# Patient Record
Sex: Female | Born: 1960 | Race: Black or African American | Hispanic: No | Marital: Single | Smoking: Current every day smoker
Health system: Southern US, Community
[De-identification: ages and names within clinical notes are randomized; demographics above are authoritative.]

## PROBLEM LIST (undated history)

## (undated) DIAGNOSIS — E119 Type 2 diabetes mellitus without complications: Secondary | ICD-10-CM

## (undated) DIAGNOSIS — I10 Essential (primary) hypertension: Secondary | ICD-10-CM

## (undated) DIAGNOSIS — IMO0001 Reserved for inherently not codable concepts without codable children: Secondary | ICD-10-CM

## (undated) DIAGNOSIS — M329 Systemic lupus erythematosus, unspecified: Secondary | ICD-10-CM

## (undated) DIAGNOSIS — K754 Autoimmune hepatitis: Secondary | ICD-10-CM

## (undated) DIAGNOSIS — IMO0002 Reserved for concepts with insufficient information to code with codable children: Secondary | ICD-10-CM

## (undated) DIAGNOSIS — D496 Neoplasm of unspecified behavior of brain: Secondary | ICD-10-CM

## (undated) DIAGNOSIS — J189 Pneumonia, unspecified organism: Secondary | ICD-10-CM

## (undated) DIAGNOSIS — E662 Morbid (severe) obesity with alveolar hypoventilation: Secondary | ICD-10-CM

## (undated) DIAGNOSIS — K219 Gastro-esophageal reflux disease without esophagitis: Secondary | ICD-10-CM

## (undated) DIAGNOSIS — F17200 Nicotine dependence, unspecified, uncomplicated: Secondary | ICD-10-CM

## (undated) HISTORY — DX: Neoplasm of unspecified behavior of brain: D49.6

## (undated) HISTORY — PX: DILATION AND CURETTAGE OF UTERUS: SHX78

## (undated) HISTORY — PX: CHOLECYSTECTOMY: SHX55

## (undated) HISTORY — PX: EYE SURGERY: SHX253

---

## 2015-07-01 ENCOUNTER — Emergency Department (HOSPITAL_COMMUNITY)
Admission: EM | Admit: 2015-07-01 | Discharge: 2015-07-01 | Disposition: A | Discharge status: Home / Self Care | Payer: Medicare Other | Attending: Emergency Medicine | Admitting: Emergency Medicine

## 2015-07-01 ENCOUNTER — Encounter (HOSPITAL_COMMUNITY): Payer: Self-pay

## 2015-07-01 DIAGNOSIS — T783XXA Angioneurotic edema, initial encounter: Secondary | ICD-10-CM | POA: Diagnosis not present

## 2015-07-01 DIAGNOSIS — Z8739 Personal history of other diseases of the musculoskeletal system and connective tissue: Secondary | ICD-10-CM | POA: Diagnosis not present

## 2015-07-01 DIAGNOSIS — R220 Localized swelling, mass and lump, head: Secondary | ICD-10-CM | POA: Diagnosis present

## 2015-07-01 DIAGNOSIS — T450X5A Adverse effect of antiallergic and antiemetic drugs, initial encounter: Secondary | ICD-10-CM | POA: Insufficient documentation

## 2015-07-01 DIAGNOSIS — Z8719 Personal history of other diseases of the digestive system: Secondary | ICD-10-CM | POA: Diagnosis not present

## 2015-07-01 DIAGNOSIS — I10 Essential (primary) hypertension: Secondary | ICD-10-CM | POA: Diagnosis not present

## 2015-07-01 DIAGNOSIS — R110 Nausea: Secondary | ICD-10-CM | POA: Insufficient documentation

## 2015-07-01 DIAGNOSIS — E119 Type 2 diabetes mellitus without complications: Secondary | ICD-10-CM | POA: Diagnosis not present

## 2015-07-01 DIAGNOSIS — Z720 Tobacco use: Secondary | ICD-10-CM | POA: Diagnosis not present

## 2015-07-01 DIAGNOSIS — R0689 Other abnormalities of breathing: Secondary | ICD-10-CM | POA: Insufficient documentation

## 2015-07-01 DIAGNOSIS — T464X5A Adverse effect of angiotensin-converting-enzyme inhibitors, initial encounter: Secondary | ICD-10-CM

## 2015-07-01 HISTORY — DX: Type 2 diabetes mellitus without complications: E11.9

## 2015-07-01 HISTORY — DX: Systemic lupus erythematosus, unspecified: M32.9

## 2015-07-01 HISTORY — DX: Reserved for concepts with insufficient information to code with codable children: IMO0002

## 2015-07-01 HISTORY — DX: Essential (primary) hypertension: I10

## 2015-07-01 HISTORY — DX: Autoimmune hepatitis: K75.4

## 2015-07-01 MED ORDER — METOCLOPRAMIDE HCL 5 MG/ML IJ SOLN
10.0000 mg | Freq: Once | INTRAMUSCULAR | Status: AC
  Administered 2015-07-01: 10 mg via INTRAVENOUS
  Filled 2015-07-01: qty 2

## 2015-07-01 MED ORDER — FAMOTIDINE IN NACL 20-0.9 MG/50ML-% IV SOLN
20.0000 mg | Freq: Once | INTRAVENOUS | Status: AC
  Administered 2015-07-01: 20 mg via INTRAVENOUS
  Filled 2015-07-01: qty 50

## 2015-07-01 MED ORDER — METHYLPREDNISOLONE SODIUM SUCC 125 MG IJ SOLR
125.0000 mg | Freq: Once | INTRAMUSCULAR | Status: AC
  Administered 2015-07-01: 125 mg via INTRAVENOUS
  Filled 2015-07-01: qty 2

## 2015-07-01 NOTE — ED Notes (Signed)
Pt reports she feels 90% back to normal.

## 2015-07-01 NOTE — ED Notes (Signed)
Per EMS - pt began experiencing tongue swelling 2000 last night, took 453m benadryl w/ some symptom improvement. Pt went to sleep and symptoms worsened throughout night. Pt c/o initial difficulty swallowing and tongue swelling. Pt on lisinopril. C/o nausea and shortness of breath, no wheezing. RA 88% initially, given .15 IM epi, 586mbenadryl IV, 53m41mofran IV and placed on 3L [place] - pt sats improved to 94%. Pt hx lupus (occasional steroid therapy), diabetes, htn, and autoimmune hepatitis. Allergy to iodine.  20G Left Forearm. BP 166/100, 93% on 2-3L.

## 2015-07-01 NOTE — Discharge Instructions (Signed)
Angioedema. You must stop your Lisinopril. You cannot take any medications known as ACE inhibitors (blood pressure medications). Angioedema is a sudden swelling of tissues, often of the skin. It can occur on the face or genitals or in the abdomen or other body parts. The swelling usually develops over a short period and gets better in 24 to 48 hours. It often begins during the night and is found when the person wakes up. The person may also get red, itchy patches of skin (hives). Angioedema can be dangerous if it involves swelling of the air passages.  Depending on the cause, episodes of angioedema may only happen once, come back in unpredictable patterns, or repeat for several years and then gradually fade away.  CAUSES  Angioedema can be caused by an allergic reaction to various triggers. It can also result from nonallergic causes, including reactions to drugs, immune system disorders, viral infections, or an abnormal gene that is passed to you from your parents (hereditary). For some people with angioedema, the cause is unknown.  Some things that can trigger angioedema include:   Foods.   Medicines, such as ACE inhibitors, ARBs, nonsteroidal anti-inflammatory agents, or estrogen.   Latex.   Animal saliva.   Insect stings.   Dyes used in X-rays.   Mild injury.   Dental work.  Surgery.  Stress.   Sudden changes in temperature.   Exercise. SIGNS AND SYMPTOMS   Swelling of the skin.  Hives. If these are present, there is also intense itching.  Redness in the affected area.   Pain in the affected area.  Swollen lips or tongue.  Breathing problems. This may happen if the air passages swell.  Wheezing. If internal organs are involved, there may be:   Nausea.   Abdominal pain.   Vomiting.   Difficulty swallowing.   Difficulty passing urine. DIAGNOSIS   Your health care provider will examine the affected area and take a medical and family  history.  Various tests may be done to help determine the cause. Tests may include:  Allergy skin tests to see if the problem is an allergic reaction.   Blood tests to check for hereditary angioedema.   Tests to check for underlying diseases that could cause the condition.   A review of your medicines, including over-the-counter medicines, may be done. TREATMENT  Treatment will depend on the cause of the angioedema. Possible treatments include:   Removal of anything that triggered the condition (such as stopping certain medicines).   Medicines to treat symptoms or prevent attacks. Medicines given may include:   Antihistamines.   Epinephrine injection.   Steroids.   Hospitalization may be required for severe attacks. If the air passages are affected, it can be an emergency. Tubes may need to be placed to keep the airway open. HOME CARE INSTRUCTIONS   Take all medicines as directed by your health care provider.  If you were given medicines for emergency allergy treatment, always carry them with you.  Wear a medical bracelet as directed by your health care provider.   Avoid known triggers. SEEK MEDICAL CARE IF:   You have repeat attacks of angioedema.   Your attacks are more frequent or more severe despite preventive measures.   You have hereditary angioedema and are considering having children. It is important to discuss with your health care provider the risks of passing the condition on to your children. SEEK IMMEDIATE MEDICAL CARE IF:   You have severe swelling of the mouth, tongue, or  lips.  You have difficulty breathing.   You have difficulty swallowing.   You faint. MAKE SURE YOU:  Understand these instructions.  Will watch your condition.  Will get help right away if you are not doing well or get worse. Document Released: 01/31/2002 Document Revised: 04/08/2014 Document Reviewed: 07/16/2013 [facility] Patient Information 2015 [place], [place].  This information is not intended to replace advice given to you by your health care provider. Make sure you discuss any questions you have with your health care provider.

## 2015-07-01 NOTE — ED Notes (Signed)
Pt reports feeling better; talking without distress.

## 2015-07-01 NOTE — ED Provider Notes (Addendum)
Patient recheck after signed out by Dr. Roxanne Mins for assuming care. The patient reports that she's had approximately 90% improvement relative to the amount of swelling she initially had. She reports she continues to feel some degree of fullness in her throat but now is able to breathe and swallow. She reports that the symptoms started a proximal line 9 PM with swelling at the lip which progressed and then developed tongue swelling that ultimately fill the entirety of her mouth and not allowing her to swallow or speak. The patient has been treated with epinephrine, Benadryl, Solu-Medrol and Pepcid. She has had positive response with significant decrease in the reported tongue swelling. At this time I'm able to visualize the posterior airway with the uvula. Uvula still appears slightly boggy but Mallampati is class 2-3. Patient is speaking clearly, voice is mildly hoarse. and not exhibiting respiratory distress.  Charlesetta Shanks, MD 07/01/15 [phone]  Symptoms have resolved. Patient is counseled on ACE inhibitor angioedema. She is counseled on the necessity of returning immediately should symptoms begin to rebound.  Charlesetta Shanks, MD 07/01/15 [phone]

## 2015-07-01 NOTE — ED Notes (Signed)
Pt c/o phlegm in throat, spitting and dry heaving while this RN was in room. Denies nausea, but c/o continued difficulty swallowing (denies worsening of difficulty swallowing and that it is not similar to when throat initially swelling before). Pt aware to let RN know if she has any worsening difficulty swallowing, shortness of breath, pain, etc.

## 2015-07-01 NOTE — ED Provider Notes (Signed)
CSN: [id number]     Arrival date & time 07/01/15  8101 History   First MD Initiated Contact with Patient 07/01/15 [phone]     Chief Complaint  Patient presents with  . Angioedema     (Consider location/radiation/quality/duration/timing/severity/associated sxs/prior Treatment) The history is provided by the patient and the EMS personnel.   54 year old female noted onset about 8 PM of swelling in her tongue. She took Benadryl went to sleep but woke up with worsening tongue swelling and facial swelling. She was having some difficulty swallowing. She also noted some difficulty breathing and some nausea. She called for an ambulance and EMS noted initial oxygen saturation of 88%. She was given epinephrine, diphenhydramine, ondansetron and placed on nasal oxygen with oxygen saturation improving to 94%. She did have some decrease in her swelling following above-noted treatment. She does take lisinopril. She's never had any reactions like this before.  Past Medical History  Diagnosis Date  . Diabetes mellitus without complication   . Hypertension   . Autoimmune hepatitis   . Lupus    Past Surgical History  Procedure Laterality Date  . Cholecystectomy     No family history on file. History  Substance Use Topics  . Smoking status: Current Every Day Smoker -- 1.00 packs/day    Types: Cigarettes  . Smokeless tobacco: Never Used  . Alcohol Use: No   OB History    No data available     Review of Systems  All other systems reviewed and are negative.     Allergies  Iodine  Home Medications   Prior to Admission medications   Not on File   BP 143/83 mmHg  Pulse 77  Temp(Src) 98.3 F (36.8 C) (Oral)  Resp 15  Ht _0  (1.676 m)  Wt 265 lb (120.203 kg)  BMI 42.79 kg/m2  SpO2 93% Physical Exam  Nursing note and vitals reviewed.  54 year old female, resting comfortably and in no acute distress. Vital signs are significant for borderline hypertension. Oxygen saturation is 93%,  which is normal. Head is normocephalic and atraumatic. PERRLA, EOMI. angioedema is noted of the tongue and sublingual tissue. Speech is somewhat dysarthric because of her tongue swelling. There is no pooling of secretions and no stridor. Mild edema is noted of the uvula. Facial swelling is present which extends down into the neck. Neck is nontender and supple without adenopathy or JVD. Back is nontender and there is no CVA tenderness. Lungs are clear without rales, wheezes, or rhonchi. Chest is nontender. Heart has regular rate and rhythm without murmur. Abdomen is soft, flat, nontender without masses or hepatosplenomegaly and peristalsis is normoactive. Extremities have no cyanosis or edema, full range of motion is present. Skin is warm and dry without rash. Neurologic: Mental status is normal except for speech difficulty related to tongue swelling, cranial nerves are intact, there are no motor or sensory deficits.  ED Course  Procedures (including critical care time)   EKG Interpretation   Date/Time:  Tuesday July 01 2015 06:28:00 EDT Ventricular Rate:  75 PR Interval:  159 QRS Duration: 87 QT Interval:  402 QTC Calculation: 449 R Axis:   91 Text Interpretation:  Sinus rhythm Borderline right axis deviation No old  tracing to compare Confirmed by [facility]  MD, Leilan Bochenek ([id number]) on 07/01/2015  6:41:04 AM      CRITICAL CARE Performed by: DPOEU,MPNTI Total critical care time: 60 minutes Critical care time was exclusive of separately billable procedures and treating other patients.  Critical care was necessary to treat or prevent imminent or life-threatening deterioration. Critical care was time spent personally by me on the following activities: development of treatment plan with patient and/or surrogate as well as nursing, discussions with consultants, evaluation of patient's response to treatment, examination of patient, obtaining history from patient or surrogate, ordering and performing  treatments and interventions, ordering and review of laboratory studies, ordering and review of radiographic studies, pulse oximetry and re-evaluation of patient's condition.  MDM   Final diagnoses:  ACE inhibitor-aggravated angioedema, initial encounter    Angioedema which is most likely related to ACE inhibitor lisinopril. She has responded well to epinephrine, diphenhydramine. Famotidine will be added as well has methylprednisolone and she will need to be observed in the ED. May need to consider admission.  7:50 AM Tongue swelling seems to be decreasing. Her speech is clear. She did have one episode of emesis but feels better after metoclopramide. With her improving, I feel she probably will not need admission but will need fairly prolonged observation in the ED.  8:42 AM Feeling much better but needs further observation before can discharge. Case is signed out to Dr. Vallery Ridge.  Delora Fuel, MD 83/09/40 [id number]

## 2015-07-01 NOTE — ED Notes (Signed)
Pt reassessed and Dr. Johnney Killian notified for possible discharge. Pt and family informed of delay.

## 2015-07-01 NOTE — ED Notes (Signed)
MD at bedside. 

## 2015-12-29 ENCOUNTER — Observation Stay (HOSPITAL_COMMUNITY)
Admission: EM | Admit: 2015-12-29 | Discharge: 2015-12-31 | Disposition: A | Discharge status: Home / Self Care | Payer: Medicare HMO | Attending: Internal Medicine | Admitting: Internal Medicine

## 2015-12-29 ENCOUNTER — Inpatient Hospital Stay (HOSPITAL_COMMUNITY): Payer: Medicare HMO

## 2015-12-29 ENCOUNTER — Emergency Department (HOSPITAL_COMMUNITY): Payer: Medicare HMO

## 2015-12-29 ENCOUNTER — Encounter (HOSPITAL_COMMUNITY): Payer: Self-pay | Admitting: Emergency Medicine

## 2015-12-29 DIAGNOSIS — R0602 Shortness of breath: Secondary | ICD-10-CM | POA: Insufficient documentation

## 2015-12-29 DIAGNOSIS — R0600 Dyspnea, unspecified: Secondary | ICD-10-CM

## 2015-12-29 DIAGNOSIS — I10 Essential (primary) hypertension: Secondary | ICD-10-CM | POA: Insufficient documentation

## 2015-12-29 DIAGNOSIS — Z79899 Other long term (current) drug therapy: Secondary | ICD-10-CM | POA: Diagnosis not present

## 2015-12-29 DIAGNOSIS — E1165 Type 2 diabetes mellitus with hyperglycemia: Secondary | ICD-10-CM | POA: Insufficient documentation

## 2015-12-29 DIAGNOSIS — G8929 Other chronic pain: Secondary | ICD-10-CM

## 2015-12-29 DIAGNOSIS — J9620 Acute and chronic respiratory failure, unspecified whether with hypoxia or hypercapnia: Secondary | ICD-10-CM

## 2015-12-29 DIAGNOSIS — R740 Nonspecific elevation of levels of transaminase and lactic acid dehydrogenase [LDH]: Secondary | ICD-10-CM

## 2015-12-29 DIAGNOSIS — D689 Coagulation defect, unspecified: Secondary | ICD-10-CM | POA: Insufficient documentation

## 2015-12-29 DIAGNOSIS — E669 Obesity, unspecified: Secondary | ICD-10-CM | POA: Insufficient documentation

## 2015-12-29 DIAGNOSIS — E119 Type 2 diabetes mellitus without complications: Secondary | ICD-10-CM | POA: Insufficient documentation

## 2015-12-29 DIAGNOSIS — R109 Unspecified abdominal pain: Secondary | ICD-10-CM

## 2015-12-29 DIAGNOSIS — J9621 Acute and chronic respiratory failure with hypoxia: Secondary | ICD-10-CM

## 2015-12-29 DIAGNOSIS — R0902 Hypoxemia: Secondary | ICD-10-CM | POA: Diagnosis present

## 2015-12-29 DIAGNOSIS — F319 Bipolar disorder, unspecified: Secondary | ICD-10-CM | POA: Diagnosis not present

## 2015-12-29 DIAGNOSIS — F17210 Nicotine dependence, cigarettes, uncomplicated: Secondary | ICD-10-CM | POA: Insufficient documentation

## 2015-12-29 DIAGNOSIS — R079 Chest pain, unspecified: Principal | ICD-10-CM | POA: Insufficient documentation

## 2015-12-29 DIAGNOSIS — Z6841 Body Mass Index (BMI) 40.0 and over, adult: Secondary | ICD-10-CM | POA: Diagnosis not present

## 2015-12-29 DIAGNOSIS — F329 Major depressive disorder, single episode, unspecified: Secondary | ICD-10-CM

## 2015-12-29 DIAGNOSIS — Z794 Long term (current) use of insulin: Secondary | ICD-10-CM | POA: Diagnosis not present

## 2015-12-29 DIAGNOSIS — M329 Systemic lupus erythematosus, unspecified: Secondary | ICD-10-CM | POA: Diagnosis not present

## 2015-12-29 DIAGNOSIS — R1032 Left lower quadrant pain: Secondary | ICD-10-CM

## 2015-12-29 DIAGNOSIS — R7989 Other specified abnormal findings of blood chemistry: Secondary | ICD-10-CM

## 2015-12-29 DIAGNOSIS — R7401 Elevation of levels of liver transaminase levels: Secondary | ICD-10-CM

## 2015-12-29 DIAGNOSIS — E118 Type 2 diabetes mellitus with unspecified complications: Secondary | ICD-10-CM

## 2015-12-29 DIAGNOSIS — K754 Autoimmune hepatitis: Secondary | ICD-10-CM | POA: Diagnosis not present

## 2015-12-29 DIAGNOSIS — F32A Depression, unspecified: Secondary | ICD-10-CM

## 2015-12-29 DIAGNOSIS — R945 Abnormal results of liver function studies: Secondary | ICD-10-CM

## 2015-12-29 DIAGNOSIS — IMO0002 Reserved for concepts with insufficient information to code with codable children: Secondary | ICD-10-CM

## 2015-12-29 HISTORY — DX: Gastro-esophageal reflux disease without esophagitis: K21.9

## 2015-12-29 HISTORY — DX: Reserved for inherently not codable concepts without codable children: IMO0001

## 2015-12-29 LAB — CBG MONITORING, ED
GLUCOSE-CAPILLARY: 328 mg/dL — AB (ref 65–99)
Glucose-Capillary: 304 mg/dL — ABNORMAL HIGH (ref 65–99)

## 2015-12-29 LAB — CBC WITH DIFFERENTIAL/PLATELET
BASOS ABS: 0.1 10*3/uL (ref 0.0–0.1)
Basophils Relative: 1 %
Eosinophils Absolute: 0.1 10*3/uL (ref 0.0–0.7)
Eosinophils Relative: 2 %
HEMATOCRIT: 38.7 % (ref 36.0–46.0)
Hemoglobin: 13.1 g/dL (ref 12.0–15.0)
LYMPHS ABS: 3 10*3/uL (ref 0.7–4.0)
Lymphocytes Relative: 37 %
MCH: 32 pg (ref 26.0–34.0)
MCHC: 33.9 g/dL (ref 30.0–36.0)
MCV: 94.6 fL (ref 78.0–100.0)
MONOS PCT: 6 %
Monocytes Absolute: 0.5 10*3/uL (ref 0.1–1.0)
NEUTROS PCT: 54 %
Neutro Abs: 4.4 10*3/uL (ref 1.7–7.7)
Platelets: 223 10*3/uL (ref 150–400)
RBC: 4.09 MIL/uL (ref 3.87–5.11)
RDW: 15 % (ref 11.5–15.5)
WBC: 8.1 10*3/uL (ref 4.0–10.5)

## 2015-12-29 LAB — COMPREHENSIVE METABOLIC PANEL
ALK PHOS: 62 U/L (ref 38–126)
ALT: 302 U/L — AB (ref 14–54)
AST: 243 U/L — AB (ref 15–41)
Albumin: 3 g/dL — ABNORMAL LOW (ref 3.5–5.0)
Anion gap: 8 (ref 5–15)
BUN: 10 mg/dL (ref 6–20)
CALCIUM: 8.7 mg/dL — AB (ref 8.9–10.3)
CHLORIDE: 103 mmol/L (ref 101–111)
CO2: 22 mmol/L (ref 22–32)
CREATININE: 0.77 mg/dL (ref 0.44–1.00)
GFR calc non Af Amer: >60 mL/min (ref >60)
Glucose, Bld: 361 mg/dL — ABNORMAL HIGH (ref 65–99)
Potassium: 3.5 mmol/L (ref 3.5–5.1)
Sodium: 133 mmol/L — ABNORMAL LOW (ref 135–145)
Total Bilirubin: 0.9 mg/dL (ref 0.3–1.2)
Total Protein: 10.7 g/dL — ABNORMAL HIGH (ref 6.5–8.1)

## 2015-12-29 LAB — BRAIN NATRIURETIC PEPTIDE: B Natriuretic Peptide: 7.5 pg/mL (ref 0.0–100.0)

## 2015-12-29 LAB — URINALYSIS, ROUTINE W REFLEX MICROSCOPIC
Bilirubin Urine: NEGATIVE
Glucose, UA: >1000 mg/dL — AB
Ketones, ur: 15 mg/dL — AB
LEUKOCYTES UA: NEGATIVE
Nitrite: NEGATIVE
Protein, ur: 100 mg/dL — AB
SPECIFIC GRAVITY, URINE: 1.023 (ref 1.005–1.030)
pH: 5 (ref 5.0–8.0)

## 2015-12-29 LAB — URINE MICROSCOPIC-ADD ON
BACTERIA UA: NONE SEEN
WBC, UA: NONE SEEN WBC/hpf (ref 0–5)

## 2015-12-29 LAB — GLUCOSE, CAPILLARY
GLUCOSE-CAPILLARY: 384 mg/dL — AB (ref 65–99)
Glucose-Capillary: 363 mg/dL — ABNORMAL HIGH (ref 65–99)

## 2015-12-29 LAB — TROPONIN I: Troponin I: <0.03 ng/mL (ref <0.031)

## 2015-12-29 LAB — LIPASE, BLOOD: Lipase: 29 U/L (ref 11–51)

## 2015-12-29 LAB — D-DIMER, QUANTITATIVE (NOT AT ARMC): D DIMER QUANT: 1.21 ug{FEU}/mL — AB (ref 0.00–0.50)

## 2015-12-29 MED ORDER — FAMOTIDINE 20 MG PO TABS
20.0000 mg | ORAL_TABLET | Freq: Two times a day (BID) | ORAL | Status: DC
  Administered 2015-12-29 – 2015-12-31 (×4): 20 mg via ORAL
  Filled 2015-12-29 (×4): qty 1

## 2015-12-29 MED ORDER — ATENOLOL 25 MG PO TABS
50.0000 mg | ORAL_TABLET | Freq: Every day | ORAL | Status: DC
  Administered 2015-12-29 – 2015-12-31 (×3): 50 mg via ORAL
  Filled 2015-12-29 (×3): qty 2

## 2015-12-29 MED ORDER — ONDANSETRON HCL 4 MG PO TABS
4.0000 mg | ORAL_TABLET | Freq: Four times a day (QID) | ORAL | Status: DC | PRN
  Administered 2015-12-30: 4 mg via ORAL
  Filled 2015-12-29: qty 1

## 2015-12-29 MED ORDER — ACETAMINOPHEN 650 MG RE SUPP: 650.0000 mg | Freq: Four times a day (QID) | RECTAL | Status: DC | PRN

## 2015-12-29 MED ORDER — ALBUTEROL SULFATE (2.5 MG/3ML) 0.083% IN NEBU: 3.0000 mL | INHALATION_SOLUTION | Freq: Four times a day (QID) | RESPIRATORY_TRACT | Status: DC | PRN

## 2015-12-29 MED ORDER — INSULIN ASPART 100 UNIT/ML ~~LOC~~ SOLN
0.0000 [IU] | Freq: Three times a day (TID) | SUBCUTANEOUS | Status: DC
  Administered 2015-12-29: 15 [IU] via SUBCUTANEOUS
  Administered 2015-12-29: 11 [IU] via SUBCUTANEOUS
  Administered 2015-12-30: 15 [IU] via SUBCUTANEOUS
  Administered 2015-12-30: 11 [IU] via SUBCUTANEOUS
  Administered 2015-12-30: 15 [IU] via SUBCUTANEOUS
  Administered 2015-12-31 (×2): 11 [IU] via SUBCUTANEOUS
  Filled 2015-12-29: qty 1

## 2015-12-29 MED ORDER — PAROXETINE HCL 20 MG PO TABS
40.0000 mg | ORAL_TABLET | ORAL | Status: DC
  Administered 2015-12-29 – 2015-12-31 (×3): 40 mg via ORAL
  Filled 2015-12-29 (×3): qty 2

## 2015-12-29 MED ORDER — PREGABALIN 75 MG PO CAPS
225.0000 mg | ORAL_CAPSULE | Freq: Two times a day (BID) | ORAL | Status: DC
Start: 2015-12-29 — End: 2015-12-31
  Administered 2015-12-29 – 2015-12-31 (×4): 225 mg via ORAL
  Filled 2015-12-29 (×4): qty 3

## 2015-12-29 MED ORDER — ENOXAPARIN SODIUM 40 MG/0.4ML ~~LOC~~ SOLN
40.0000 mg | SUBCUTANEOUS | Status: DC
  Administered 2015-12-29 – 2015-12-30 (×2): 40 mg via SUBCUTANEOUS
  Filled 2015-12-29 (×2): qty 0.4

## 2015-12-29 MED ORDER — ACETAMINOPHEN 325 MG PO TABS
650.0000 mg | ORAL_TABLET | Freq: Four times a day (QID) | ORAL | Status: DC | PRN
  Administered 2015-12-30 (×3): 650 mg via ORAL
  Filled 2015-12-29 (×3): qty 2

## 2015-12-29 MED ORDER — ONDANSETRON HCL 4 MG/2ML IJ SOLN
4.0000 mg | Freq: Four times a day (QID) | INTRAMUSCULAR | Status: DC | PRN
  Administered 2015-12-29: 4 mg via INTRAVENOUS
  Filled 2015-12-29: qty 2

## 2015-12-29 MED ORDER — TECHNETIUM TO 99M ALBUMIN AGGREGATED
4.4000 | Freq: Once | INTRAVENOUS | Status: AC | PRN
  Administered 2015-12-29: 4 via INTRAVENOUS

## 2015-12-29 MED ORDER — OXYCODONE HCL 5 MG PO TABS
10.0000 mg | ORAL_TABLET | Freq: Four times a day (QID) | ORAL | Status: DC | PRN
  Administered 2015-12-29 – 2015-12-31 (×2): 10 mg via ORAL
  Filled 2015-12-29 (×3): qty 2

## 2015-12-29 MED ORDER — TECHNETIUM TC 99M DIETHYLENETRIAME-PENTAACETIC ACID: 32.1000 | Freq: Once | INTRAVENOUS | Status: DC | PRN

## 2015-12-29 MED ORDER — SODIUM CHLORIDE 0.9 % IV BOLUS (SEPSIS)
500.0000 mL | Freq: Once | INTRAVENOUS | Status: AC
  Administered 2015-12-29: 500 mL via INTRAVENOUS

## 2015-12-29 MED ORDER — ENOXAPARIN SODIUM 120 MG/0.8ML ~~LOC~~ SOLN
120.0000 mg | Freq: Once | SUBCUTANEOUS | Status: AC
  Administered 2015-12-29: 120 mg via SUBCUTANEOUS
  Filled 2015-12-29: qty 0.8

## 2015-12-29 MED ORDER — SODIUM CHLORIDE 0.9 % IJ SOLN
3.0000 mL | Freq: Two times a day (BID) | INTRAMUSCULAR | Status: DC
  Administered 2015-12-29 – 2015-12-31 (×4): 3 mL via INTRAVENOUS

## 2015-12-29 MED ORDER — AMLODIPINE BESYLATE 10 MG PO TABS
10.0000 mg | ORAL_TABLET | Freq: Every day | ORAL | Status: DC
  Administered 2015-12-29 – 2015-12-30 (×2): 10 mg via ORAL
  Filled 2015-12-29 (×2): qty 1

## 2015-12-29 MED ORDER — NICOTINE 14 MG/24HR TD PT24
14.0000 mg | MEDICATED_PATCH | Freq: Every day | TRANSDERMAL | Status: DC
  Administered 2015-12-29 – 2015-12-31 (×3): 14 mg via TRANSDERMAL
  Filled 2015-12-29 (×3): qty 1

## 2015-12-29 NOTE — ED Notes (Signed)
MD notified that pts SpO2 dropped to 88% on room air.

## 2015-12-29 NOTE — ED Notes (Signed)
Attempted report x1.

## 2015-12-29 NOTE — H&P (Signed)
[facility] History and Physical  Monica Ellis KYH:[id number] DOB: Jul 13, 1961 DOA: 12/29/2015  Referring physician: Emergency Department PCP: Hinton Lovely, MD   CHIEF COMPLAINT:  Chest pain                 HPI: Monica Ellis is a 55 y.o. female  with a past medical history not limited to lupus, type 2 diabetes, autoimmune hepatitis and obesity. Patient presented to the emergency department today with a three-day history of nonexertional , non-radiating chest pain associated with shortness of breath.  Pain intermittent, not related to movement, cough or eating. No recent air travel. No history of DVTs.   Patient has several other medical complaints such as a two-week history of left lower quadrant pain which is sharp and intermittent in nature. The pain is unrelated to eating or bowel movements. The pain is not positional. No urinary symptoms. Bowel movement are normal.   ED COURSE:           Labs:   Sodium 133, potassium 3.5. Creatinine 0.7, AST 243, ALT 302, total bilirubin 0.9, troponin less than 0.03, BNP 7.5 CBC normal INR 1.21 glucose 361             CXR:     Low lung volumes        EKG:    Sinus tachycardia Right axis deviation                  Medications  sodium chloride 0.9 % bolus 500 mL (500 mLs Intravenous New Bag/Given 12/29/15 1026)     Review of Systems  Constitutional: Positive for malaise/fatigue.  HENT: Positive for sore throat.   Eyes: Negative.   Respiratory: Positive for shortness of breath.   Cardiovascular: Negative.   Gastrointestinal: Positive for nausea and vomiting.  Genitourinary: Negative.   Musculoskeletal: Negative.   Skin: Negative.   Neurological: Negative.   Endo/Heme/Allergies: Negative.   Psychiatric/Behavioral: The patient has insomnia.     Past Medical History  Diagnosis Date  . Diabetes mellitus without complication ([place])   . Hypertension   . Autoimmune hepatitis ([place])   . Lupus [facility])    Past Surgical  History  Procedure Laterality Date  . Cholecystectomy    . Dilation and curettage of uterus      SOCIAL HISTORY:  reports that she has been smoking Cigarettes.  She has been smoking about 1.00 pack per day. She has never used smokeless tobacco. She reports that she does not drink alcohol. Her drug history is not on file. Lives:  With a roomate  Assistive devices:   Cane needed for ambulation.   Allergies  Allergen Reactions  . Iodinated Diagnostic Agents   . Iodine Swelling  . Janumet [Sitagliptin-Metformin Hcl] Other (See Comments)    Continuous yeast infection  . Lisinopril Swelling    Other reaction(s): Angioedema (ALLERGY/intolerance), Face  . Tramadol     Other reaction(s): Mental Status Changes (intolerance)  . Victoza [Liraglutide] Nausea And Vomiting    FMH: diabetes and HTN in father  Prior to Admission medications   Medication Sig Start Date End Date Taking? Authorizing Provider  albuterol (PROAIR HFA) 108 (90 Base) MCG/ACT inhaler Inhale 1 puff into the lungs every 6 (six) hours as needed for wheezing or shortness of breath.    Yes Historical Provider, MD  amLODipine (NORVASC) 10 MG tablet Take 10 mg by mouth at bedtime.    Yes Historical Provider, MD  atenolol (TENORMIN) 50 MG tablet Take 50  mg by mouth daily.   Yes Historical Provider, MD  cholecalciferol (VITAMIN D) 400 units TABS tablet Take 800 Units by mouth.   Yes Historical Provider, MD  insulin lispro (HUMALOG KWIKPEN) 100 UNIT/ML KiwkPen Inject 30 Units into the skin 3 (three) times daily with meals.   Yes Historical Provider, MD  Multiple Vitamin (MULTI-VITAMINS) TABS Take 1 tablet by mouth.   Yes Historical Provider, MD  nystatin cream (MYCOSTATIN) Apply 1 application topically 3 (three) times daily.   Yes Historical Provider, MD  Oxycodone HCl 10 MG TABS Take 10 mg by mouth every 6 (six) hours as needed (pain).   Yes Historical Provider, MD  PARoxetine (PAXIL) 40 MG tablet Take 40 mg by mouth every morning.    Yes Historical Provider, MD  promethazine (PHENERGAN) 25 MG tablet Take 25 mg by mouth every 6 (six) hours as needed for nausea or vomiting.   Yes Historical Provider, MD  ranitidine (ZANTAC) 150 MG capsule Take 300 mg by mouth 2 (two) times daily.   Yes Historical Provider, MD  SitaGLIPtin-MetFORMIN HCl (JANUMET XR) [phone] MG TB24 Take 1 tablet by mouth daily.    Yes Historical Provider, MD  Albuterol Sulfate (PROAIR RESPICLICK) 032 (90 Base) MCG/ACT AEPB Inhale 2 puffs into the lungs every 6 (six) hours as needed (for wheezing or shortness of breath). Reported on 12/29/2015    Historical Provider, MD  lisinopril (PRINIVIL,ZESTRIL) 40 MG tablet Take 40 mg by mouth daily. Reported on 12/29/2015    Historical Provider, MD  metFORMIN (GLUCOPHAGE-XR) 500 MG 24 hr tablet Take 1,000 mg by mouth daily. Reported on 12/29/2015 12/05/15   Historical Provider, MD   PHYSICAL EXAM: Filed Vitals:   12/29/15 0953 12/29/15 1030 12/29/15 1115 12/29/15 1130  BP: 148/81 145/77 130/76 127/80  Pulse: 99 98 94 90  Temp: 98.2 F (36.8 C)     TempSrc: Oral     Resp: _0 SpO2: 94% 94%  91%    Wt Readings from Last 3 Encounters:  07/01/15 120.203 kg (265 lb)    General:  Pleasant obese, black  female. Appears calm and comfortable Eyes: PER, normal lids, irises & conjunctiva ENT: grossly normal hearing, lips & tongue Neck: no LAD, no masses Cardiovascular: RRR, no murmurs. No LE edema.  Respiratory: Respirations even and unlabored. Normal respiratory effort. Lungs CTA bilaterally, no wheezes / rales .   Abdomen: soft, non-distended, non-tender, active bowel sounds. No obvious masses.  Skin: no rash seen on limited exam Musculoskeletal: grossly normal tone BUE/BLE Psychiatric: grossly normal mood and affect, speech fluent and appropriate Neurologic: grossly non-focal.         LABS ON ADMISSION:    Basic Metabolic Panel:  Recent Labs Lab 12/29/15 1042  NA 133*  K 3.5  CL 103  CO2 22   GLUCOSE 361*  BUN 10  CREATININE 0.77  CALCIUM 8.7*   Liver Function Tests:  Recent Labs Lab 12/29/15 1042  AST 243*  ALT 302*  ALKPHOS 62  BILITOT 0.9  PROT 10.7*  ALBUMIN 3.0*    Recent Labs Lab 12/29/15 1042  LIPASE 29    CBC:  Recent Labs Lab 12/29/15 1042  WBC 8.1  NEUTROABS 4.4  HGB 13.1  HCT 38.7  MCV 94.6  PLT 223    BNP (last 3 results)  Recent Labs  12/29/15 1042  BNP 7.5   CBG:  Recent Labs Lab 12/29/15 1006  GLUCAP 328*    Creatinine clearance cannot be calculated (  Unknown ideal weight.)  Radiological Exams on Admission: Dg Chest 2 View  12/29/2015  CLINICAL DATA:  Worsening chest pain, shortness of breath, cough, fever. Left lower quadrant abdominal pain. EXAM: CHEST  2 VIEW COMPARISON:  11/05/2015 FINDINGS: Low lung volumes with bibasilar opacities, likely atelectasis. Heart is normal size. No effusions or acute bony abnormality. IMPRESSION: Low lung volumes with bibasilar atelectasis. Electronically Signed   By: Rolm Baptise M.D.   On: 12/29/2015 11:12    ASSESSMENT / PLAN   Non-exertional chest pain / dyspnea.with transient hypoxia.  Rule obesity-hypoventilation syndrome. Sinus tachycardia on ECG, no ST changes. Elevated D-dimer. -Admit to telemetry -cycle troponins -Await VQ scan results -02 per [place] -continue home inhaler -May need echocardiogram depending on clinical course   Uncontrolled type 2 diabetes mellitus ([place]). . Fasting CBGs in 300s at home over last two weeks.  -hold home insulin and metformin  -monitor CBGs, start SSI  Hypertension.  -continue norvasc and ACEI  Intermittent LLQ pain. Started 2 weeks ago Not really tender on exam. WBC normal. No unusual vaginal discharge.  -obtain u/a    Lupus ([place]). No treatment in several months. PCP has referred her to a Rheumatologist    Autoimmune Hepatitis / transaminitis.  Currently untreated. No baseline LFTs or U/S in Epic but transaminases have been in the 1000's  per [place] note. Patient hasn't followed up with [facility] in a year and a half. Per [facility] last office note there were some issues with immunosuppression compliance. PCP referring her to Hepatologist at [facility]. -am LFTs -abdominal ultrasound   Chronic pain ( neck, legs). Off Lyrica for a week due to finances -ask case management to help with obtaining home meds.  -restart home lyrica -continue home oxycodone  Depression. Stable.  -continue home paxil  CONSULTANTS:  none  Code Status: Full code DVT Prophylaxis: Lovenox  Family Communication:  Patient alert, oriented and understands plan of care.  Disposition Plan: Discharge to home in 2-3 days   Time spent: 60 minutes Tye Savoy  NP [facility] Pager [phone]

## 2015-12-29 NOTE — ED Notes (Signed)
Per EMS, pts coming in due to cp that is non-radiating with SOB. Pt also reports nausea and elevated CBGs. EMS CBG: 326. PT alert x4. NAD at this time. EMS gave 16m of Zofran in route. Pts SpO2 on room air is 90%. Pt placed on 2L [place].

## 2015-12-29 NOTE — ED Provider Notes (Signed)
CSN: [id number]     Arrival date & time 12/29/15  0998 History   First MD Initiated Contact with Patient 12/29/15 [phone]     Chief Complaint  Patient presents with  . Chest Pain     (Consider location/radiation/quality/duration/timing/severity/associated sxs/prior Treatment) HPI Comments: 55 year old female with lupus, autoimmune hepatitis, diabetes, smoker presents with shortness of breath, chest pain and generally feeling unwell. Patient had intermittent symptoms the past week. Patient is had nonspecific chest pain and no association nonradiating with mild shortness of breath. Shortness of breath is exertional. This is new for patient. Patient denies any lung or heart disease. No recent surgery no blood clot history no unilateral leg swelling no active cancer. Patient's had mild left lower quadrant tenderness intermittent nonradiating. No diverticular history. Patient denies fevers or chills no home oxygen use.  Patient is a 55 y.o. female presenting with chest pain. The history is provided by the patient.  Chest Pain Associated symptoms: abdominal pain, cough and shortness of breath   Associated symptoms: no back pain, no fever, no headache and not vomiting     Past Medical History  Diagnosis Date  . Diabetes mellitus without complication ([place])   . Hypertension   . Autoimmune hepatitis ([place])   . Lupus [facility])    Past Surgical History  Procedure Laterality Date  . Cholecystectomy    . Dilation and curettage of uterus     No family history on file. Social History  Substance Use Topics  . Smoking status: Current Every Day Smoker -- 1.00 packs/day    Types: Cigarettes  . Smokeless tobacco: Never Used  . Alcohol Use: No   OB History    No data available     Review of Systems  Constitutional: Positive for appetite change. Negative for fever and chills.  HENT: Negative for congestion.   Eyes: Negative for visual disturbance.  Respiratory: Positive for cough and shortness of  breath.   Cardiovascular: Positive for chest pain.  Gastrointestinal: Positive for abdominal pain. Negative for vomiting.  Genitourinary: Negative for dysuria and flank pain.  Musculoskeletal: Negative for back pain, neck pain and neck stiffness.  Skin: Negative for rash.  Neurological: Negative for light-headedness and headaches.      Allergies  Iodinated diagnostic agents; Iodine; Janumet; Lisinopril; Tramadol; and Victoza  Home Medications   Prior to Admission medications   Medication Sig Start Date End Date Taking? Authorizing Provider  albuterol (PROAIR HFA) 108 (90 Base) MCG/ACT inhaler Inhale 1 puff into the lungs every 6 (six) hours as needed for wheezing or shortness of breath.    Yes Historical Provider, MD  amLODipine (NORVASC) 10 MG tablet Take 10 mg by mouth at bedtime.    Yes Historical Provider, MD  atenolol (TENORMIN) 50 MG tablet Take 50 mg by mouth daily.   Yes Historical Provider, MD  cholecalciferol (VITAMIN D) 400 units TABS tablet Take 800 Units by mouth daily.    Yes Historical Provider, MD  insulin lispro (HUMALOG KWIKPEN) 100 UNIT/ML KiwkPen Inject 30 Units into the skin 3 (three) times daily with meals.   Yes Historical Provider, MD  Multiple Vitamin (MULTI-VITAMINS) TABS Take 1 tablet by mouth daily.    Yes Historical Provider, MD  nystatin cream (MYCOSTATIN) Apply 1 application topically 3 (three) times daily.   Yes Historical Provider, MD  Oxycodone HCl 10 MG TABS Take 10 mg by mouth every 6 (six) hours as needed (pain).   Yes Historical Provider, MD  PARoxetine (PAXIL) 40 MG tablet Take  40 mg by mouth every morning.   Yes Historical Provider, MD  pregabalin (LYRICA) 225 MG capsule Take 225 mg by mouth 2 (two) times daily.   Yes Historical Provider, MD  promethazine (PHENERGAN) 25 MG tablet Take 25 mg by mouth every 6 (six) hours as needed for nausea or vomiting.   Yes Historical Provider, MD  ranitidine (ZANTAC) 150 MG capsule Take 300 mg by mouth 2 (two)  times daily.   Yes Historical Provider, MD  SitaGLIPtin-MetFORMIN HCl (JANUMET XR) [phone] MG TB24 Take 1 tablet by mouth daily.    Yes Historical Provider, MD   BP 127/80 mmHg  Pulse 90  Temp(Src) 98.2 F (36.8 C) (Oral)  Resp 15  SpO2 91% Physical Exam  Constitutional: She is oriented to person, place, and time. She appears well-developed and well-nourished.  HENT:  Head: Normocephalic and atraumatic.  Eyes: Right eye exhibits no discharge. Left eye exhibits no discharge.  Neck: Normal range of motion. Neck supple. No tracheal deviation present.  Cardiovascular: Normal rate and regular rhythm.   Pulmonary/Chest: Effort normal and breath sounds normal.  Abdominal: Soft. She exhibits no distension. There is no tenderness. There is no guarding.  Musculoskeletal: She exhibits edema (very mild ankles bilateral no calf tenderness.).  Neurological: She is alert and oriented to person, place, and time. No cranial nerve deficit.  Skin: Skin is warm. No rash noted.  Psychiatric: She has a normal mood and affect.  Nursing note and vitals reviewed.   ED Course  Procedures (including critical care time)   EMERGENCY DEPARTMENT [place] CARDIAC EXAM "Study: Limited Ultrasound of the heart and pericardium"  INDICATIONS:Dyspnea Multiple views of the heart and pericardium were obtained in real-time with a multi-frequency probe.  PERFORMED JJ:HERDEY  IMAGES ARCHIVED?: Yes  FINDINGS: No pericardial effusion and Normal contractility  LIMITATIONS:  Body habitus  VIEWS USED: Parasternal long axis, Parasternal short axis and Apical 4 chamber   INTERPRETATION: Cardiac activity present, Pericardial effusioin absent and Cardiac tamponade absent  CPT Code: 81448-18 (limited transthoracic cardiac)  Labs Review Labs Reviewed  COMPREHENSIVE METABOLIC PANEL - Abnormal; Notable for the following:    Sodium 133 (*)    Glucose, Bld 361 (*)    Calcium 8.7 (*)    Total Protein 10.7 (*)    Albumin 3.0  (*)    AST 243 (*)    ALT 302 (*)    All other components within normal limits  D-DIMER, QUANTITATIVE (NOT AT [facility]) - Abnormal; Notable for the following:    D-Dimer, Quant 1.21 (*)    All other components within normal limits  CBG MONITORING, ED - Abnormal; Notable for the following:    Glucose-Capillary 328 (*)    All other components within normal limits  CBC WITH DIFFERENTIAL/PLATELET  LIPASE, BLOOD  BRAIN NATRIURETIC PEPTIDE  TROPONIN I  URINALYSIS, ROUTINE W REFLEX MICROSCOPIC (NOT AT [facility])    Imaging Review Dg Chest 2 View  12/29/2015  CLINICAL DATA:  Worsening chest pain, shortness of breath, cough, fever. Left lower quadrant abdominal pain. EXAM: CHEST  2 VIEW COMPARISON:  11/05/2015 FINDINGS: Low lung volumes with bibasilar opacities, likely atelectasis. Heart is normal size. No effusions or acute bony abnormality. IMPRESSION: Low lung volumes with bibasilar atelectasis. Electronically Signed   By: Rolm Baptise M.D.   On: 12/29/2015 11:12   I have personally reviewed and evaluated these images and lab results as part of my medical decision-making.   EKG Interpretation   Date/Time:  Monday December 29 2015 09:49:28 EST Ventricular Rate:  101 PR Interval:  144 QRS Duration: 97 QT Interval:  375 QTC Calculation: 486 R Axis:   107 Text Interpretation:  Sinus tachycardia Right axis deviation similar to  previous Confirmed by Neala Miggins  MD, Donatello Kleve ([id number]) on 12/29/2015 10:02:53 AM      MDM   Final diagnoses:  Acute dyspnea  Hypoxia  Chest pain, unspecified chest pain type   Patient with lupus presents with nonspecific chest pain with exertional shortness of breath. Patient has had a mild cough and pneumonia history however no fever and nonproductive. Plan for screening blood work, chest x-ray, cardiac screen. Plan for bedside ultrasound to ensure no significant pericardial effusion. Patient borderline oxygen 90% placed on 2 L. Patient low risk blood clot d-dimer  sent.  Patient with persistent mild soreness of breath under examination requiring nasal cannula oxygenation. Plan for addition to telemetry. Bedside ultrasound no obvious heart strain, no significant  Effusion. V/Q pending.  The patients results and plan were reviewed and discussed.   Any x-rays performed were independently reviewed by myself.   Differential diagnosis were considered with the presenting HPI.  Medications  sodium chloride 0.9 % bolus 500 mL (0 mLs Intravenous Stopped 12/29/15 1130)    Filed Vitals:   12/29/15 0953 12/29/15 1030 12/29/15 1115 12/29/15 1130  BP: 148/81 145/77 130/76 127/80  Pulse: 99 98 94 90  Temp: 98.2 F (36.8 C)     TempSrc: Oral     Resp: _0 SpO2: 94% 94%  91%    Final diagnoses:  Acute dyspnea  Hypoxia  Chest pain, unspecified chest pain type    Admission/ observation were discussed with the admitting physician, patient and/or family and they are comfortable with the plan.       Elnora Morrison, MD 12/29/15 1249

## 2015-12-30 ENCOUNTER — Encounter (HOSPITAL_COMMUNITY): Payer: Self-pay | Admitting: General Practice

## 2015-12-30 DIAGNOSIS — R945 Abnormal results of liver function studies: Secondary | ICD-10-CM | POA: Insufficient documentation

## 2015-12-30 DIAGNOSIS — E119 Type 2 diabetes mellitus without complications: Secondary | ICD-10-CM | POA: Insufficient documentation

## 2015-12-30 DIAGNOSIS — E118 Type 2 diabetes mellitus with unspecified complications: Secondary | ICD-10-CM | POA: Insufficient documentation

## 2015-12-30 DIAGNOSIS — R079 Chest pain, unspecified: Secondary | ICD-10-CM | POA: Insufficient documentation

## 2015-12-30 DIAGNOSIS — R7989 Other specified abnormal findings of blood chemistry: Secondary | ICD-10-CM | POA: Insufficient documentation

## 2015-12-30 LAB — CBC
HEMATOCRIT: 35.3 % — AB (ref 36.0–46.0)
HEMOGLOBIN: 11.4 g/dL — AB (ref 12.0–15.0)
MCH: 31 pg (ref 26.0–34.0)
MCHC: 32.3 g/dL (ref 30.0–36.0)
MCV: 95.9 fL (ref 78.0–100.0)
Platelets: 222 10*3/uL (ref 150–400)
RBC: 3.68 MIL/uL — AB (ref 3.87–5.11)
RDW: 14.9 % (ref 11.5–15.5)
WBC: 7.3 10*3/uL (ref 4.0–10.5)

## 2015-12-30 LAB — URINALYSIS, ROUTINE W REFLEX MICROSCOPIC
Bilirubin Urine: NEGATIVE
Glucose, UA: >1000 mg/dL — AB
Ketones, ur: NEGATIVE mg/dL
LEUKOCYTES UA: NEGATIVE
Nitrite: NEGATIVE
PROTEIN: 100 mg/dL — AB
SPECIFIC GRAVITY, URINE: 1.028 (ref 1.005–1.030)
pH: 5.5 (ref 5.0–8.0)

## 2015-12-30 LAB — COMPREHENSIVE METABOLIC PANEL
ALT: 285 U/L — ABNORMAL HIGH (ref 14–54)
AST: 227 U/L — ABNORMAL HIGH (ref 15–41)
Albumin: 2.7 g/dL — ABNORMAL LOW (ref 3.5–5.0)
Alkaline Phosphatase: 64 U/L (ref 38–126)
Anion gap: 9 (ref 5–15)
BUN: 13 mg/dL (ref 6–20)
CO2: 22 mmol/L (ref 22–32)
Calcium: 8.7 mg/dL — ABNORMAL LOW (ref 8.9–10.3)
Chloride: 101 mmol/L (ref 101–111)
Creatinine, Ser: 0.9 mg/dL (ref 0.44–1.00)
GFR calc Af Amer: >60 mL/min (ref >60)
GFR calc non Af Amer: >60 mL/min (ref >60)
Glucose, Bld: 382 mg/dL — ABNORMAL HIGH (ref 65–99)
Potassium: 4 mmol/L (ref 3.5–5.1)
Sodium: 132 mmol/L — ABNORMAL LOW (ref 135–145)
Total Bilirubin: 0.6 mg/dL (ref 0.3–1.2)
Total Protein: 9.8 g/dL — ABNORMAL HIGH (ref 6.5–8.1)

## 2015-12-30 LAB — GLUCOSE, CAPILLARY
GLUCOSE-CAPILLARY: 364 mg/dL — AB (ref 65–99)
GLUCOSE-CAPILLARY: 395 mg/dL — AB (ref 65–99)
GLUCOSE-CAPILLARY: 386 mg/dL — AB (ref 65–99)
Glucose-Capillary: 330 mg/dL — ABNORMAL HIGH (ref 65–99)
Glucose-Capillary: 337 mg/dL — ABNORMAL HIGH (ref 65–99)

## 2015-12-30 LAB — TROPONIN I

## 2015-12-30 LAB — URINE MICROSCOPIC-ADD ON

## 2015-12-30 LAB — HEMOGLOBIN A1C
Hgb A1c MFr Bld: 10 % — ABNORMAL HIGH (ref 4.8–5.6)
Mean Plasma Glucose: 240 mg/dL

## 2015-12-30 MED ORDER — LIVING WELL WITH DIABETES BOOK
Freq: Once | Status: AC
  Administered 2015-12-30: 16:00:00
  Filled 2015-12-30 (×2): qty 1

## 2015-12-30 MED ORDER — INSULIN GLARGINE 100 UNIT/ML ~~LOC~~ SOLN
12.0000 [IU] | Freq: Every day | SUBCUTANEOUS | Status: DC
  Administered 2015-12-30 – 2015-12-31 (×2): 12 [IU] via SUBCUTANEOUS
  Filled 2015-12-30 (×2): qty 0.12

## 2015-12-30 NOTE — Progress Notes (Signed)
PATIENT C/O "FREEZING" AND SEVERE HEAD AND NECK PAIN. SHIVERING UNCONTROLLABLY. VITALS WNL. CBG 330.  INTERNAL MED ON-CALL NOTIFIED.  PATIENT MEDICATED WITH TYLENOL. BLANKETS AND HEAT PACK PROVIDED.

## 2015-12-30 NOTE — Progress Notes (Signed)
Monica Ellis TPN:[id number] DOB: 1961-05-04 DOA: 12/29/2015 PCP: Hinton Lovely, MD  Brief narrative: 54 y/o ? DM ty II AIHA not on Rx-previously seen [facility] and being referred [facility] Lupus Body mass index is 44.37 kg/(m^2). htn Smoker   Admitted to [facility] with CP VQ neg Troponin x 3 neg  Past medical history-As per Problem list Chart reviewed as below-   Consultants:    Procedures:    Antibiotics:     Subjective  Alert pleasant  Multiple somatic c/o including R sided pain, n [although ate full breakfast] And ha No endorsement of current crushing CP   Objective    Interim History:   Telemetry:    Objective: Filed Vitals:   12/29/15 1652 12/29/15 2025 12/30/15 0451 12/30/15 1000  BP: 125/72 116/74 107/49 136/74  Pulse: 97 72 72 78  Temp: 98.2 F (36.8 C) 99.3 F (37.4 C) 98.6 F (37 C) 97.9 F (36.6 C)  TempSrc: Oral Oral Oral Oral  Resp: _0 Weight:      SpO2: 94% 93% 91% 92%    Intake/Output Summary (Last 24 hours) at 12/30/15 1331 Last data filed at 12/30/15 1230  Gross per 24 hour  Intake    480 ml  Output    800 ml  Net   -320 ml    Exam:  General: eomi Cardiovascular: s1 s2 no m/r/g Respiratory: clear no added soudn Abdomen: soft obese nt nd no reboudn Skin no le edema Neuro intact  Data Reviewed: Basic Metabolic Panel:  Recent Labs Lab 12/29/15 1042 12/30/15 0320  NA 133* 132*  K 3.5 4.0  CL 103 101  CO2 22 22  GLUCOSE 361* 382*  BUN 10 13  CREATININE 0.77 0.90  CALCIUM 8.7* 8.7*   Liver Function Tests:  Recent Labs Lab 12/29/15 1042 12/30/15 0320  AST 243* 227*  ALT 302* 285*  ALKPHOS 62 64  BILITOT 0.9 0.6  PROT 10.7* 9.8*  ALBUMIN 3.0* 2.7*    Recent Labs Lab 12/29/15 1042  LIPASE 29   No results for input(s): AMMONIA in the last 168 hours. CBC:  Recent Labs Lab 12/29/15 1042 12/30/15 0320  WBC 8.1 7.3  NEUTROABS 4.4  --   HGB 13.1 11.4*  HCT 38.7 35.3*  MCV 94.6 95.9  PLT  223 222   Cardiac Enzymes:  Recent Labs Lab 12/29/15 1042 12/29/15 1529 12/29/15 2056 12/30/15 0320  TROPONINI <0.03 <0.03 <0.03 <0.03   BNP: Invalid input(s): POCBNP CBG:  Recent Labs Lab 12/29/15 1618 12/29/15 1848 12/29/15 2031 12/30/15 0624 12/30/15 1125  GLUCAP 304* 363* 384* 337* 364*    No results found for this or any previous visit (from the past 240 hour(s)).   Studies:              All Imaging reviewed and is as per above notation   Scheduled Meds: . amLODipine  10 mg Oral QHS  . atenolol  50 mg Oral Daily  . enoxaparin (LOVENOX) injection  40 mg Subcutaneous Q24H  . famotidine  20 mg Oral BID  . insulin aspart  0-15 Units Subcutaneous TID WC  . insulin glargine  12 Units Subcutaneous Daily  . nicotine  14 mg Transdermal Daily  . PARoxetine  40 mg Oral BH-q7a  . pregabalin  225 mg Oral BID  . sodium chloride  3 mL Intravenous Q12H   Continuous Infusions:    Assessment/Plan:  1. Cp-unclear etiology-unlikely cardiogenic.  Trop x 3 neg.  OP follow up c PCP 2. Uncontrolled DM ty II-started lantus this admission as only on SSI 30 U TID in addition to orals. Will need education regarding long/short acting.  Blood sugars still above 300.  Keep patient until sugars lower then 250 3. AIH-previously followed Dr. Len Blalock taking mycophenolate nor steroids.  Needs OP f/u [facility] 4. Lupus-was taken off plaquenil.  Follow with Rheum as OP 5. Bipolar-cont paxil 40 daily 6. Htn-cont Norvasc 10 qhs, atenolol 50 qd.   7. Smoker-cont nicotine pathc  DM coordinator to see Can d/c if CBG improved in 24 hr  Verneita Griffes, MD  [facility] Pager [phone] 12/30/2015, 1:31 PM    LOS: 1 day

## 2015-12-30 NOTE — Progress Notes (Signed)
Inpatient Diabetes Program Recommendations  AACE/ADA: New Consensus Statement on Inpatient Glycemic Control (2015)  Target Ranges:  Prepandial:   less than 140 mg/dL      Peak postprandial:   less than 180 mg/dL (1-2 hours)      Critically ill patients:  140 - 180 mg/dL   Review of Glycemic Control  Diabetes history: DM 2 Outpatient Diabetes medications: Janumet, metformin, humalog 30 units tidwc Current orders for Inpatient glycemic control: Lantus 12 units and moderate correction tidwc  Inpatient Diabetes Program Recommendations:    Consult received regarding new to basal insulin and need to teach the action and potential acute complications using lantus. These acute complications are the same for the humalog, but I will assess her knowledge and potential education needs. Will order diabetes education per bedside RN using TV ed'l video network and patient ed manual, and dietician consult as well.  Patient has been needing 11-15 units correction before each meal and still high in 300's, an average of 33--45 units correction per day. Still correction has not controlled. Patient takes 30 units meal coverage at home tidwc. Please also add meal coverage of 6 units to start tidwc here. Patient most probably will need more basal as well. (I believe her Medicare part D HMO covers levemir rather than lantus which could be taken bid)  Thank you Rosita Kea, RN, MSN, CDE  Diabetes Inpatient Program Office: [phone] Pager: [phone] 8:00 am to 5:00 pm

## 2015-12-30 NOTE — Care Management Note (Signed)
Case Management Note  Patient Details  Name: Monica Ellis MRN: [id number] Date of Birth: 10-26-1961  Subjective/Objective:    Pt admitted with hypoxia                Action/Plan:  Pt is independent from home with roommate.  CM received consult requesting assistance with medication copay.  CM verified with pt that she does in fact have active prescription coverage and since the first of the year her copays have been higher than normal; around $8-$9.  CM informed pt that she would not be able to provide medication assistance due to active prescription coverage.  CM asked pt about Lyrica specifically and was told the copay was less than $8, CM instructed pt that she could contact medication manufactures directly to request assistance with copays.  CM will continue to monitor for disposition nees.   Expected Discharge Date:                  Expected Discharge Plan:  Home/Self Care  In-House Referral:     Discharge planning Services  CM Consult  Post Acute Care Choice:    Choice offered to:     DME Arranged:    DME Agency:     HH Arranged:    HH Agency:     Status of Service:  In process, will continue to follow  Medicare Important Message Given:    Date Medicare IM Given:    Medicare IM give by:    Date Additional Medicare IM Given:    Additional Medicare Important Message give by:     If discussed at [place] of Stay Meetings, dates discussed:    Additional Comments:  Maryclare Labrador, RN 12/30/2015, 11:19 AM

## 2015-12-31 DIAGNOSIS — F329 Major depressive disorder, single episode, unspecified: Secondary | ICD-10-CM

## 2015-12-31 DIAGNOSIS — G8929 Other chronic pain: Secondary | ICD-10-CM

## 2015-12-31 DIAGNOSIS — R0600 Dyspnea, unspecified: Secondary | ICD-10-CM

## 2015-12-31 DIAGNOSIS — R0902 Hypoxemia: Secondary | ICD-10-CM

## 2015-12-31 LAB — GLUCOSE, CAPILLARY
GLUCOSE-CAPILLARY: 333 mg/dL — AB (ref 65–99)
Glucose-Capillary: 329 mg/dL — ABNORMAL HIGH (ref 65–99)

## 2015-12-31 MED ORDER — INSULIN ASPART 100 UNIT/ML ~~LOC~~ SOLN
6.0000 [IU] | Freq: Three times a day (TID) | SUBCUTANEOUS | Status: DC
  Administered 2015-12-31: 6 [IU] via SUBCUTANEOUS

## 2015-12-31 MED ORDER — KETOROLAC TROMETHAMINE 30 MG/ML IJ SOLN
30.0000 mg | Freq: Once | INTRAMUSCULAR | Status: AC
  Administered 2015-12-31: 30 mg via INTRAVENOUS
  Filled 2015-12-31: qty 1

## 2015-12-31 MED ORDER — NICOTINE 14 MG/24HR TD PT24: 14.0000 mg | MEDICATED_PATCH | Freq: Every day | TRANSDERMAL | Status: DC

## 2015-12-31 MED ORDER — INSULIN GLARGINE 100 UNIT/ML ~~LOC~~ SOLN
13.0000 [IU] | Freq: Once | SUBCUTANEOUS | Status: AC
  Administered 2015-12-31: 13 [IU] via SUBCUTANEOUS
  Filled 2015-12-31: qty 0.13

## 2015-12-31 MED ORDER — METOCLOPRAMIDE HCL 5 MG/ML IJ SOLN
10.0000 mg | Freq: Once | INTRAMUSCULAR | Status: AC
  Administered 2015-12-31: 10 mg via INTRAVENOUS
  Filled 2015-12-31: qty 2

## 2015-12-31 MED ORDER — IBUPROFEN 600 MG PO TABS: 600.0000 mg | ORAL_TABLET | Freq: Four times a day (QID) | ORAL | Status: DC | PRN

## 2015-12-31 MED ORDER — INSULIN LISPRO 100 UNIT/ML (KWIKPEN): PEN_INJECTOR | SUBCUTANEOUS | Status: DC

## 2015-12-31 MED ORDER — DIPHENHYDRAMINE HCL 50 MG/ML IJ SOLN
25.0000 mg | Freq: Once | INTRAMUSCULAR | Status: AC
  Administered 2015-12-31: 25 mg via INTRAVENOUS
  Filled 2015-12-31: qty 1

## 2015-12-31 MED ORDER — INSULIN GLARGINE 100 UNIT/ML SOLOSTAR PEN: 25.0000 [IU] | PEN_INJECTOR | Freq: Every day | SUBCUTANEOUS | Status: DC

## 2015-12-31 MED ORDER — SODIUM CHLORIDE 0.9 % IV BOLUS (SEPSIS)
500.0000 mL | Freq: Once | INTRAVENOUS | Status: AC
  Administered 2015-12-31: 500 mL via INTRAVENOUS

## 2015-12-31 NOTE — Progress Notes (Signed)
Pt D/C home per MD order, D/C instructions reviewed with pt, all questions answered. Pt aware of follow up appt., Pt  Instructed to pick up his prescriptions at [facility].IV removed and site looks clean and intact, Pt verbalized understanding of discharged instructions.

## 2015-12-31 NOTE — Progress Notes (Signed)
Nutrition Education Note  RD consulted for nutrition education regarding diabetes.   Lab Results  Component Value Date   HGBA1C 10.0* 12/29/2015    RD provided "Carbohydrate Counting for People with Diabetes" handout from the Academy of Nutrition and Dietetics. Discussed different food groups and their effects on blood sugar, emphasizing carbohydrate-containing foods. Provided list of carbohydrates and recommended serving sizes of common foods.  Discussed importance of controlled and consistent carbohydrate intake throughout the day. Provided examples of ways to balance meals/snacks and encouraged intake of high-fiber, whole grain complex carbohydrates. Teach back method used.  Expect fair compliance.  Body mass index is 44.37 kg/(m^2). Pt meets criteria for obese class III based on current BMI.  Current diet order is carb modified, patient is consuming approximately 100% of meals at this time. Labs and medications reviewed. No further nutrition interventions warranted at this time. RD contact information provided. If additional nutrition issues arise, please re-consult RD.  Monica Ellis. Monica Muratore, MS, RD LDN After Hours/Weekend Pager [phone]

## 2015-12-31 NOTE — Progress Notes (Addendum)
Inpatient Diabetes Program Recommendations  AACE/ADA: New Consensus Statement on Inpatient Glycemic Control (2015)  Target Ranges:  Prepandial:   less than 140 mg/dL      Peak postprandial:   less than 180 mg/dL (1-2 hours)      Critically ill patients:  140 - 180 mg/dL   Review of Glycemic Control  Diabetes history: type 2 Outpatient Diabetes medications: Humalog 30 units tidwc and Janumet (XR) 100/1000 mg daily Current orders for Inpatient glycemic control: Lantus 12 units and moderate correction tidwc  Inpatient Diabetes Program Recommendations:    Referral to educate patient regarding addition of basal insulin to her home insulin of humalog 30 units tidwc and assist with lantus dosing with recommendations. Patient uses the humalog quick pen at home. Will discuss with patient the specific action and purpose of basal insulin.  Presently ordered lantus at 12 units which does not appear to be effective at controlling glucose. Patient needing 11-15 units correction tidwc 300's for total of 33-45 units correction per day to maintain glucose in the 300's. Recommend increase in basal lantus to 20-25 units and add novolog meal coverage of 6 units tidwc in addition to the moderate correction insulin ordered presently. Please also add HS correction scale. (Patient's po intake is documented as 100%). Attempted to see patient yesterday afternoon as well as this am. She is presently sleeping and has just been given pain medicine. Will revisit again this afternoon to talk with patient about the basal insulin. Ad-spoke with Dr Ree Kida regarding discharge orders for insulin. She would like to discharge patient on lantus and novolog/humalog 6 units meal coverage tidwc and lantus 25 units. We are presently unsure of insurance coverage for the lantus;Dr Ree Kida requesting care management to assess coverage. AC  Thank you Rosita Kea, RN, MSN, CDE  Diabetes Inpatient Program Office: [phone] Pager:  [phone] 8:00 am to 5:00 pm

## 2015-12-31 NOTE — Discharge Instructions (Signed)
Type 2 Diabetes Mellitus, Adult Type 2 diabetes mellitus, often simply referred to as type 2 diabetes, is a long-lasting (chronic) disease. In type 2 diabetes, the pancreas does not make enough insulin (a hormone), the cells are less responsive to the insulin that is made (insulin resistance), or both. Normally, insulin moves sugars from food into the tissue cells. The tissue cells use the sugars for energy. The lack of insulin or the lack of normal response to insulin causes excess sugars to build up in the blood instead of going into the tissue cells. As a result, high blood sugar (hyperglycemia) develops. The effect of high sugar (glucose) levels can cause many complications. Type 2 diabetes was also previously called adult-onset diabetes, but it can occur at any age.  RISK FACTORS  A person is predisposed to developing type 2 diabetes if someone in the family has the disease and also has one or more of the following primary risk factors:  Weight gain, or being overweight or obese.  An inactive lifestyle.  A history of consistently eating high-calorie foods. Maintaining a normal weight and regular physical activity can reduce the chance of developing type 2 diabetes. SYMPTOMS  A person with type 2 diabetes may not show symptoms initially. The symptoms of type 2 diabetes appear slowly. The symptoms include:  Increased thirst (polydipsia).  Increased urination (polyuria).  Increased urination during the night (nocturia).  Sudden or unexplained weight changes.  Frequent, recurring infections.  Tiredness (fatigue).  Weakness.  Vision changes, such as blurred vision.  Fruity smell to your breath.  Abdominal pain.  Nausea or vomiting.  Cuts or bruises which are slow to heal.  Tingling or numbness in the hands or feet.  An open skin wound (ulcer). DIAGNOSIS Type 2 diabetes is frequently not diagnosed until complications of diabetes are present. Type 2 diabetes is diagnosed  when symptoms or complications are present and when blood glucose levels are increased. Your blood glucose level may be checked by one or more of the following blood tests:  A fasting blood glucose test. You will not be allowed to eat for at least 8 hours before a blood sample is taken.  A random blood glucose test. Your blood glucose is checked at any time of the day regardless of when you ate.  A hemoglobin A1c blood glucose test. A hemoglobin A1c test provides information about blood glucose control over the previous 3 months.  An oral glucose tolerance test (OGTT). Your blood glucose is measured after you have not eaten (fasted) for 2 hours and then after you drink a glucose-containing beverage. TREATMENT   You may need to take insulin or diabetes medicine daily to keep blood glucose levels in the desired range.  If you use insulin, you may need to adjust the dosage depending on the carbohydrates that you eat with each meal or snack.  Lifestyle changes are recommended as part of your treatment. These may include:  Following an individualized diet plan developed by a nutritionist or dietitian.  Exercising daily. Your health care providers will set individualized treatment goals for you based on your age, your medicines, how long you have had diabetes, and any other medical conditions you have. Generally, the goal of treatment is to maintain the following blood glucose levels:  Before meals (preprandial): 80-130 mg/dL.  After meals (postprandial): below 180 mg/dL.  A1c: less than 6.5-7%. HOME CARE INSTRUCTIONS   Have your hemoglobin A1c level checked twice a year.  Perform daily blood glucose monitoring  as directed by your health care provider.  Monitor urine ketones when you are ill and as directed by your health care provider.  Take your diabetes medicine or insulin as directed by your health care provider to maintain your blood glucose levels in the desired range.  Never run  out of diabetes medicine or insulin. It is needed every day.  If you are using insulin, you may need to adjust the amount of insulin given based on your intake of carbohydrates. Carbohydrates can raise blood glucose levels but need to be included in your diet. Carbohydrates provide vitamins, minerals, and fiber which are an essential part of a healthy diet. Carbohydrates are found in fruits, vegetables, whole grains, dairy products, legumes, and foods containing added sugars.  Eat healthy foods. You should make an appointment to see a registered dietitian to help you create an eating plan that is right for you.  Lose weight if you are overweight.  Carry a medical alert card or wear your medical alert jewelry.  Carry a 15-gram carbohydrate snack with you at all times to treat low blood glucose (hypoglycemia). Some examples of 15-gram carbohydrate snacks include:  Glucose tablets, 3 or 4.  Glucose gel, 15-gram tube.  Raisins, 2 tablespoons (24 grams).  Jelly beans, 6.  Animal crackers, 8.  Regular pop, 4 ounces (120 mL).  Gummy treats, 9.  Recognize hypoglycemia. Hypoglycemia occurs with blood glucose levels of 70 mg/dL and below. The risk for hypoglycemia increases when fasting or skipping meals, during or after intense exercise, and during sleep. Hypoglycemia symptoms can include:  Tremors or shakes.  Decreased ability to concentrate.  Sweating.  Increased heart rate.  Headache.  Dry mouth.  Hunger.  Irritability.  Anxiety.  Restless sleep.  Altered speech or coordination.  Confusion.  Treat hypoglycemia promptly. If you are alert and able to safely swallow, follow the 15:15 rule:  Take 15-20 grams of rapid-acting glucose or carbohydrate. Rapid-acting options include glucose gel, glucose tablets, or 4 ounces (120 mL) of fruit juice, regular soda, or low-fat milk.  Check your blood glucose level 15 minutes after taking the glucose.  Take 15-20 grams more of  glucose if the repeat blood glucose level is still 70 mg/dL or below.  Eat a meal or snack within 1 hour once blood glucose levels return to normal.  Be alert to feeling very thirsty and urinating more frequently than usual, which are early signs of hyperglycemia. An early awareness of hyperglycemia allows for prompt treatment. Treat hyperglycemia as directed by your health care provider.  Engage in at least 150 minutes of moderate-intensity physical activity a week, spread over at least 3 days of the week or as directed by your health care provider. In addition, you should engage in resistance exercise at least 2 times a week or as directed by your health care provider. Try to spend no more than 90 minutes at one time inactive.  Adjust your medicine and food intake as needed if you start a new exercise or sport.  Follow your sick-day plan anytime you are unable to eat or drink as usual.  Do not use any tobacco products including cigarettes, chewing tobacco, or electronic cigarettes. If you need help quitting, ask your health care provider.  Limit alcohol intake to no more than 1 drink per day for nonpregnant women and 2 drinks per day for men. You should drink alcohol only when you are also eating food. Talk with your health care provider whether alcohol is safe  for you. Tell your health care provider if you drink alcohol several times a week.  Keep all follow-up visits as directed by your health care provider. This is important.  Schedule an eye exam soon after the diagnosis of type 2 diabetes and then annually.  Perform daily skin and foot care. Examine your skin and feet daily for cuts, bruises, redness, nail problems, bleeding, blisters, or sores. A foot exam by a health care provider should be done annually.  Brush your teeth and gums at least twice a day and floss at least once a day. Follow up with your dentist regularly.  Share your diabetes management plan with your workplace or  school.  Keep your immunizations up to date. It is recommended that you receive a flu (influenza) vaccine every year. It is also recommended that you receive a pneumonia (pneumococcal) vaccine. If you are 57 years of age or older and have never received a pneumonia vaccine, this vaccine may be given as a series of two separate shots. Ask your health care provider which additional vaccines may be recommended.  Learn to manage stress.  Obtain ongoing diabetes education and support as needed.  Participate in or seek rehabilitation as needed to maintain or improve independence and quality of life. Request a physical or occupational therapy referral if you are having foot or hand numbness, or difficulties with grooming, dressing, eating, or physical activity. SEEK MEDICAL CARE IF:   You are unable to eat food or drink fluids for more than 6 hours.  You have nausea and vomiting for more than 6 hours.  Your blood glucose level is over 240 mg/dL.  There is a change in mental status.  You develop an additional serious illness.  You have diarrhea for more than 6 hours.  You have been sick or have had a fever for a couple of days and are not getting better.  You have pain during any physical activity.  SEEK IMMEDIATE MEDICAL CARE IF:  You have difficulty breathing.  You have moderate to large ketone levels.   This information is not intended to replace advice given to you by your health care provider. Make sure you discuss any questions you have with your health care provider.   Document Released: 11/22/2005 Document Revised: 08/13/2015 Document Reviewed: 06/20/2012 Elsevier Interactive Patient Education [employer].

## 2015-12-31 NOTE — Care Management Note (Signed)
Case Management Note  Patient Details  Name: Denell Cothern MRN: [id number] Date of Birth: Dec 28, 1960  Subjective/Objective:    Pt admitted with hypoxia                Action/Plan:  Pt is independent from home with roommate.  CM received consult requesting assistance with medication copay.  CM verified with pt that she does in fact have active prescription coverage and since the first of the year her copays have been higher than normal; around $8-$9.  CM informed pt that she would not be able to provide medication assistance due to active prescription coverage.  CM asked pt about Lyrica specifically and was told the copay was less than $8, CM instructed pt that she could contact medication manufactures directly to request assistance with copays.  CM will continue to monitor for disposition nees.   Expected Discharge Date:                  Expected Discharge Plan:  Home/Self Care  In-House Referral:     Discharge planning Services  CM Consult  Post Acute Care Choice:    Choice offered to:     DME Arranged:    DME Agency:     HH Arranged:    HH Agency:     Status of Service:  In process, will continue to follow  Medicare Important Message Given:    Date Medicare IM Given:    Medicare IM give by:    Date Additional Medicare IM Given:    Additional Medicare Important Message give by:     If discussed at [place] of Stay Meetings, dates discussed:    Additional Comments: 12/31/2015 Pt will discharge home today.  CM submitted benefit check for both Levemir and Lantus per consult, approximate copay per pts insurance will be less than $8 a month. Maryclare Labrador, RN 12/31/2015, 1:42 PM

## 2015-12-31 NOTE — Plan of Care (Signed)
Problem: Food- and Nutrition-Related Knowledge Deficit (NB-1.1) Goal: Nutrition education Formal process to instruct or train a patient/client in a skill or to impart knowledge to help patients/clients voluntarily manage or modify food choices and eating behavior to maintain or improve health. Outcome: Completed/Met Date Met:  12/31/15  RD consulted for nutrition education regarding diabetes.     Lab Results  Component Value Date    HGBA1C 10.0* 12/29/2015    RD provided "Carbohydrate Counting for People with Diabetes" handout from the Academy of Nutrition and Dietetics. Discussed different food groups and their effects on blood sugar, emphasizing carbohydrate-containing foods. Provided list of carbohydrates and recommended serving sizes of common foods.  Discussed importance of controlled and consistent carbohydrate intake throughout the day. Provided examples of ways to balance meals/snacks and encouraged intake of high-fiber, whole grain complex carbohydrates. Teach back method used.  Expect fair compliance.  Body mass index is 44.37 kg/(m^2). Pt meets criteria for obese class III based on current BMI.  Current diet order is Carb modified, patient is consuming approximately 100% of meals at this time. Labs and medications reviewed. No further nutrition interventions warranted at this time. RD contact information provided. If additional nutrition issues arise, please re-consult RD.  Satira Anis. Keari Miu, MS, RD LDN After Hours/Weekend Pager [phone]

## 2015-12-31 NOTE — Progress Notes (Signed)
Request for insurance check regarding Lantus vs Levemir received- Per rep at [facility]:   Both Levemir and Lantus (vial and pen) have an $8.25 co-pay at [place], no auth required

## 2015-12-31 NOTE — Progress Notes (Signed)
Inpatient Diabetes Program Recommendations  AACE/ADA: New Consensus Statement on Inpatient Glycemic Control (2015)  Target Ranges:  Prepandial:   less than 140 mg/dL      Peak postprandial:   less than 180 mg/dL (1-2 hours)      Critically ill patients:  140 - 180 mg/dL   Review of Glycemic Control Visited with patient and discussed at length her health problems over the past 4 years.  She states that since her lupus was diagnosed, she has been unable to keep her glucose controlled. She states her Primary MD is Marland Kitchen and PA is Tiana Loft at [facility] [employer].  She states that the MD and PA were able to get an override when denied humalog coverage at less expensive cost.  She was not aware of basal insulin and how it would help manage her cbg's fasting and throughout the day. She states she has not had any hypoglycemia since taking the humalog 30 units tidwc.  She states her cbg's typically run in 200's, occasionally in 180's. Care management is checking her benefits to assess potential for affordable basal insulin. Otherwise, patient will need at least NPH in the am and HS to help with glucose control. Spoke with Dr Ree Kida regarding potential use of NPH (ReliOn NPH from [facility]) if she could not get the levemir or lantus coverage. Patient was tearful as she states she has not felt good but 2 days in the past 4 years.  She has a roommate who she states will help her as best he can. If she is discharged home on NPH, she will need to be refreshed or taught how to draw up insulin from a vial. Will order a starter kit and instruct RN's to teach/demonstrate how to use if she cannot get the lantus or levemir. Recommend NPH 20 units am and HS.Thank you Rosita Kea, RN, MSN, CDE  Diabetes Inpatient Program Office: [phone] Pager: [phone] 8:00 am to 5:00 pm Inpatient Diabetes Program Recommendations:

## 2015-12-31 NOTE — Progress Notes (Signed)
Spoke with Dr Ree Kida who was just told that patient's insurance will cover levemir and lantus at $8.00 per month. Discharge orders will include lantus, humalog meal coverage and humalog correction tidwc. Spoke with RN in care of patient to explain the correction scale and meal coverage as well as the lantus.  I explained to her in depth the action of basal and bolus insulin and when and hhow to take.  Confirmed all with RN. Hopeful discharge today.   Thank you Rosita Kea, RN, MSN, CDE  Diabetes Inpatient Program Office: [phone] Pager: [phone] 8:00 am to 5:00 pm

## 2015-12-31 NOTE — Discharge Summary (Signed)
.  Physician Discharge Summary  Monica Ellis PYK:[id number] DOB: [birth year] DOA: 12/29/2015  PCP: Hinton Lovely, MD  Admit date: 12/29/2015 Discharge date: 12/31/2015  Time spent: 45 minutes  Recommendations for Outpatient Follow-up:  Patient will be discharged to home.  Patient will need to follow up with primary care provider within one week of discharge.  Patient should continue medications as prescribed.  Patient should follow a carb modified diet.   Discharge Diagnoses:  Chest pain Shortness of breath/elevated d-dimer Uncontrolled diabetes mellitus, type II Autoimmune hepatitis Lupus Bipolar/depression  hypertension   tobacco abuse  Discharge Condition: Stable  Diet recommendation: carb modified  Filed Weights   12/29/15 1326  Weight: 124.648 kg (274 lb 12.8 oz)    History of present illness:  On 12/29/2015 by Ms. Tye Savoy, NP Monica Ellis is a 55 y.o. female with a past medical history not limited to lupus, type 2 diabetes, autoimmune hepatitis and obesity. Patient presented to the emergency department today with a three-day history of nonexertional , non-radiating chest pain associated with shortness of breath. Pain intermittent, not related to movement, cough or eating. No recent air travel. No history of DVTs.   Patient has several other medical complaints such as a two-week history of left lower quadrant pain which is sharp and intermittent in nature. The pain is unrelated to eating or bowel movements. The pain is not positional. No urinary symptoms. Bowel movement are normal.   Hospital Course:  Chest pain -Troponin cycled and found to be negative 3 -Unclear etiology -Recommend outpatient follow-up -Chest pain has resolved  Shortness of breath /Elevated d-dimer -VQ scan unremarkable  Uncontrolled diabetes mellitus, type II -HbA1c 10 -Will discharge patient with Lantus 25 units daily, Humalog 6 units per meal as well as moderate  correction sliding scale -Patient should continue Janumet at discharge -Diabetes coordinator appreciated, has gone through each case, the patient -She will need close follow-up with her PCP for better management  Autoimmune hepatitis -Continue outpatient follow-up at [facility], Dr. Laurier Nancy  Lupus -Patient was taken off of Actonel, she should continue outpatient follow-up with rheumatology  Bipolar/depression -Continue Paxil  Hypertension -Continue amlodipine, atenolol  Tobacco abuse -Discussed smoking cessation -Continue nicotine patch  Procedures: VQ scan Abdominal ultrasound  Consultations: None  Discharge Exam: Filed Vitals:   12/31/15 0409 12/31/15 0833  BP: 114/73 127/66  Pulse: 72 78  Temp: 97.9 F (36.6 C) 97.9 F (36.6 C)  Resp: 18      General: Well developed, well nourished, NAD, appears stated age  HEENT: NCAT, mucous membranes moist.  Cardiovascular: S1 S2 auscultated, no rubs, murmurs or gallops. Regular rate and rhythm.  Respiratory: Clear to auscultation bilaterally with equal chest rise  Abdomen: Soft, obese, nontender, nondistended, + bowel sounds  Extremities: warm dry without cyanosis clubbing or edema  Neuro: AAOx3, nonfocal  Psych: Flat Affect  Discharge Instructions      Discharge Instructions    Discharge instructions    Complete by:  As directed   Patient will be discharged to home.  Patient will need to follow up with primary care provider within one week of discharge.  Patient should continue medications as prescribed.  Patient should follow a carb modified diet.            Medication List    TAKE these medications        amLODipine 10 MG tablet  Commonly known as:  NORVASC  Take 10 mg by mouth at bedtime.  atenolol 50 MG tablet  Commonly known as:  TENORMIN  Take 50 mg by mouth daily.     cholecalciferol 400 units Tabs tablet  Commonly known as:  VITAMIN D  Take 800 Units by mouth daily.     Insulin  Glargine 100 UNIT/ML Solostar Pen  Commonly known as:  LANTUS  Inject 25 Units into the skin daily at 10 pm.     insulin lispro 100 UNIT/ML KiwkPen  Commonly known as:  HUMALOG KWIKPEN  6 units per meal Use as sliding scale: CBG < 70: implement hypoglycemia protocol CBG 70 - 120: 0 units, CBG 121 - 150: 2 units, CBG 151 - 200: 3 units, CBG 201 - 250: 5 units, CBG 251 - 300: 8 units, CBG 301 - 350: 11 units, CBG 351 - 400: 15 units     JANUMET XR [phone] MG Tb24  Generic drug:  SitaGLIPtin-MetFORMIN HCl  Take 1 tablet by mouth daily.     MULTI-VITAMINS Tabs  Take 1 tablet by mouth daily.     nicotine 14 mg/24hr patch  Commonly known as:  NICODERM CQ - dosed in mg/24 hours  Place 1 patch (14 mg total) onto the skin daily.     nystatin cream  Commonly known as:  MYCOSTATIN  Apply 1 application topically 3 (three) times daily.     Oxycodone HCl 10 MG Tabs  Take 10 mg by mouth every 6 (six) hours as needed (pain).     PARoxetine 40 MG tablet  Commonly known as:  PAXIL  Take 40 mg by mouth every morning.     pregabalin 225 MG capsule  Commonly known as:  LYRICA  Take 225 mg by mouth 2 (two) times daily.     PROAIR HFA 108 (90 Base) MCG/ACT inhaler  Generic drug:  albuterol  Inhale 1 puff into the lungs every 6 (six) hours as needed for wheezing or shortness of breath.     promethazine 25 MG tablet  Commonly known as:  PHENERGAN  Take 25 mg by mouth every 6 (six) hours as needed for nausea or vomiting.     ranitidine 150 MG capsule  Commonly known as:  ZANTAC  Take 300 mg by mouth 2 (two) times daily.       Allergies  Allergen Reactions  . Iodinated Diagnostic Agents   . Iodine Swelling  . Janumet [Sitagliptin-Metformin Hcl] Other (See Comments)    Continuous yeast infection  . Lisinopril Swelling    Other reaction(s): Angioedema (ALLERGY/intolerance), Face  . Tramadol     Other reaction(s): Mental Status Changes (intolerance)  . Victoza [Liraglutide] Nausea And  Vomiting   Follow-up Information    Follow up with Hinton Lovely, MD. Schedule an appointment as soon as possible for a visit in 1 week.   Specialty:  Internal Medicine   Why:  Hospital follow up, Diabetes management   Contact information:   [address] [place] [id number] [phone]        The results of significant diagnostics from this hospitalization (including imaging, microbiology, ancillary and laboratory) are listed below for reference.    Significant Diagnostic Studies: Dg Chest 2 View  12/29/2015  CLINICAL DATA:  Worsening chest pain, shortness of breath, cough, fever. Left lower quadrant abdominal pain. EXAM: CHEST  2 VIEW COMPARISON:  11/05/2015 FINDINGS: Low lung volumes with bibasilar opacities, likely atelectasis. Heart is normal size. No effusions or acute bony abnormality. IMPRESSION: Low lung volumes with bibasilar atelectasis. Electronically Signed  By: Rolm Baptise M.D.   On: 12/29/2015 11:12   US Abdomen Complete  12/29/2015  CLINICAL DATA:  Abdominal pain, elevated LFTs, diabetes mellitus, hypertension, autoimmune hepatitis, lupus, cholecystectomy EXAM: ABDOMEN ULTRASOUND COMPLETE COMPARISON:  None; correlation CT abdomen 07/02/2013 FINDINGS: Gallbladder: Surgically absent Common bile duct: Diameter: 4 mm diameter , normal Liver: Grossly normal echogenicity. Anterior LEFT lobe hepatic margin appears nodular likely representing cirrhosis. Hepatopetal portal venous flow. No gross evidence of hepatic mass identified though assessment of intrahepatic detail is limited secondary to body habitus and poor sound penetration. IVC: Grossly normal appearance Pancreas: Distal tail obscured by bowel gas, visualized portion normal appearance Spleen: Normal appearance, 8.3 cm length Right Kidney: Length: 11.5 cm. Normal morphology without mass or hydronephrosis. Left Kidney: Length: 11.9 cm. Normal morphology without mass or hydronephrosis. Abdominal aorta: Normal caliber Other  findings: No free fluid IMPRESSION: Cirrhotic appearing liver without gross evidence of hepatic mass though intrahepatic detail is limited due to body habitus and poor sound transmission; if better intrahepatic visualization is required, recommend CT or MR imaging with contrast. Incomplete pancreatic tail visualization. Post cholecystectomy. Electronically Signed   By: Lavonia Dana M.D.   On: 12/29/2015 14:55   Nm Pulmonary Perf And Vent  12/29/2015  CLINICAL DATA:  Acute dyspnea, chest pain, shortness of breath EXAM: NUCLEAR MEDICINE VENTILATION - PERFUSION LUNG SCAN TECHNIQUE: Ventilation images were obtained in multiple projections using inhaled aerosol Tc-80mDTPA. Perfusion images were obtained in multiple projections after intravenous injection of Tc-962mAA. RADIOPHARMACEUTICALS:  32.1 Technetium-9962mPA aerosol inhalation and 4.4 Technetium-44m24m IV COMPARISON:  None. FINDINGS: Ventilation: No focal ventilation defect. Perfusion: No wedge shaped peripheral perfusion defects to suggest acute pulmonary embolism. IMPRESSION: Normal VQ scan. Electronically Signed   By: HetaKathreen Devoidn: 12/29/2015 14:25    Microbiology: No results found for this or any previous visit (from the past 240 hour(s)).   Labs: Basic Metabolic Panel:  Recent Labs Lab 12/29/15 1042 12/30/15 0320  NA 133* 132*  K 3.5 4.0  CL 103 101  CO2 22 22  GLUCOSE 361* 382*  BUN 10 13  CREATININE 0.77 0.90  CALCIUM 8.7* 8.7*   Liver Function Tests:  Recent Labs Lab 12/29/15 1042 12/30/15 0320  AST 243* 227*  ALT 302* 285*  ALKPHOS 62 64  BILITOT 0.9 0.6  PROT 10.7* 9.8*  ALBUMIN 3.0* 2.7*    Recent Labs Lab 12/29/15 1042  LIPASE 29   No results for input(s): AMMONIA in the last 168 hours. CBC:  Recent Labs Lab 12/29/15 1042 12/30/15 0320  WBC 8.1 7.3  NEUTROABS 4.4  --   HGB 13.1 11.4*  HCT 38.7 35.3*  MCV 94.6 95.9  PLT 223 222   Cardiac Enzymes:  Recent Labs Lab 12/29/15 1042  12/29/15 1529 12/29/15 2056 12/30/15 0320  TROPONINI <0.03 <0.03 <0.03 <0.03   BNP: BNP (last 3 results)  Recent Labs  12/29/15 1042  BNP 7.5    ProBNP (last 3 results) No results for input(s): PROBNP in the last 8760 hours.  CBG:  Recent Labs Lab 12/30/15 1612 12/30/15 2107 12/30/15 2346 12/31/15 0616 12/31/15 1104  GLUCAP 395* 386* 330* 333* 329*       Signed:  Maryagnes Carrasco  [facility] 12/31/2015, 1:39 PM

## 2016-01-31 ENCOUNTER — Encounter (HOSPITAL_COMMUNITY): Payer: Self-pay

## 2016-01-31 ENCOUNTER — Emergency Department (HOSPITAL_COMMUNITY)
Admission: EM | Admit: 2016-01-31 | Discharge: 2016-02-01 | Disposition: A | Discharge status: Home / Self Care | Payer: Medicare HMO | Attending: Emergency Medicine | Admitting: Emergency Medicine

## 2016-01-31 DIAGNOSIS — Z7984 Long term (current) use of oral hypoglycemic drugs: Secondary | ICD-10-CM | POA: Diagnosis not present

## 2016-01-31 DIAGNOSIS — E876 Hypokalemia: Secondary | ICD-10-CM | POA: Diagnosis not present

## 2016-01-31 DIAGNOSIS — E871 Hypo-osmolality and hyponatremia: Secondary | ICD-10-CM | POA: Diagnosis not present

## 2016-01-31 DIAGNOSIS — F17210 Nicotine dependence, cigarettes, uncomplicated: Secondary | ICD-10-CM | POA: Diagnosis not present

## 2016-01-31 DIAGNOSIS — K219 Gastro-esophageal reflux disease without esophagitis: Secondary | ICD-10-CM | POA: Diagnosis not present

## 2016-01-31 DIAGNOSIS — I10 Essential (primary) hypertension: Secondary | ICD-10-CM | POA: Insufficient documentation

## 2016-01-31 DIAGNOSIS — Z79899 Other long term (current) drug therapy: Secondary | ICD-10-CM | POA: Diagnosis not present

## 2016-01-31 DIAGNOSIS — R112 Nausea with vomiting, unspecified: Secondary | ICD-10-CM | POA: Diagnosis present

## 2016-01-31 DIAGNOSIS — Z794 Long term (current) use of insulin: Secondary | ICD-10-CM | POA: Diagnosis not present

## 2016-01-31 DIAGNOSIS — Z8739 Personal history of other diseases of the musculoskeletal system and connective tissue: Secondary | ICD-10-CM | POA: Diagnosis not present

## 2016-01-31 DIAGNOSIS — E119 Type 2 diabetes mellitus without complications: Secondary | ICD-10-CM | POA: Diagnosis not present

## 2016-01-31 DIAGNOSIS — R197 Diarrhea, unspecified: Secondary | ICD-10-CM

## 2016-01-31 LAB — CBC WITH DIFFERENTIAL/PLATELET
BASOS ABS: 0 10*3/uL (ref 0.0–0.1)
Basophils Relative: 1 %
Eosinophils Absolute: 0.1 10*3/uL (ref 0.0–0.7)
Eosinophils Relative: 1 %
HEMATOCRIT: 38.3 % (ref 36.0–46.0)
HEMOGLOBIN: 13 g/dL (ref 12.0–15.0)
LYMPHS PCT: 28 %
Lymphs Abs: 1.8 10*3/uL (ref 0.7–4.0)
MCH: 30.8 pg (ref 26.0–34.0)
MCHC: 33.9 g/dL (ref 30.0–36.0)
MCV: 90.8 fL (ref 78.0–100.0)
MONO ABS: 0.5 10*3/uL (ref 0.1–1.0)
Monocytes Relative: 7 %
NEUTROS ABS: 4.1 10*3/uL (ref 1.7–7.7)
NEUTROS PCT: 63 %
Platelets: 169 10*3/uL (ref 150–400)
RBC: 4.22 MIL/uL (ref 3.87–5.11)
RDW: 14.5 % (ref 11.5–15.5)
WBC: 6.4 10*3/uL (ref 4.0–10.5)

## 2016-01-31 LAB — I-STAT TROPONIN, ED: TROPONIN I, POC: 0 ng/mL (ref 0.00–0.08)

## 2016-01-31 MED ORDER — MORPHINE SULFATE (PF) 4 MG/ML IV SOLN
4.0000 mg | Freq: Once | INTRAVENOUS | Status: AC
  Administered 2016-01-31: 4 mg via INTRAVENOUS
  Filled 2016-01-31: qty 1

## 2016-01-31 MED ORDER — SODIUM CHLORIDE 0.9 % IV BOLUS (SEPSIS)
1000.0000 mL | Freq: Once | INTRAVENOUS | Status: AC
  Administered 2016-01-31: 1000 mL via INTRAVENOUS

## 2016-01-31 MED ORDER — ONDANSETRON HCL 4 MG/2ML IJ SOLN
4.0000 mg | Freq: Once | INTRAMUSCULAR | Status: AC
  Administered 2016-01-31: 4 mg via INTRAVENOUS
  Filled 2016-01-31: qty 2

## 2016-01-31 NOTE — ED Notes (Signed)
Pt arrived via EMS c/o N/V/Dx2 days.  Pt states she is vomiting coffee ground emesis.  Hx: HTN, Lupus, DM, Hep. Auto immune.  CBG 299.

## 2016-01-31 NOTE — ED Provider Notes (Signed)
CSN: [id number]     Arrival date & time 01/31/16  2206 History   First MD Initiated Contact with Patient 01/31/16 2212     Chief Complaint  Patient presents with  . Nausea  . Emesis     (Consider location/radiation/quality/duration/timing/severity/associated sxs/prior Treatment) HPI Comments: Pt comes in with cc of nausea and emesis. Pt has hx of DM, SLEm hepatitis. She reports being sick for the last 2 days, with nausea, emesis, diarrhea and abd pain. Abd pain is intermittent, and kind of generalized. She is s/p cholecystectomy. She denies suspicious po intake. PT denies chest pain, dib headaches, neck pain, chills, uri like symptoms (mild nasal congestion only).   ROS 10 Systems reviewed and are negative for acute change except as noted in the HPI.     Patient is a 55 y.o. female presenting with vomiting. The history is provided by the patient.  Emesis   Past Medical History  Diagnosis Date  . Diabetes mellitus without complication ([place])   . Hypertension   . Autoimmune hepatitis ([place])   . Lupus ([place])   . Shortness of breath dyspnea   . GERD (gastroesophageal reflux disease)    Past Surgical History  Procedure Laterality Date  . Cholecystectomy    . Dilation and curettage of uterus     Family History  Problem Relation Age of Onset  . Diabetes Father   . Hypertension Father    Social History  Substance Use Topics  . Smoking status: Current Every Day Smoker -- 1.00 packs/day for 30 years    Types: Cigarettes  . Smokeless tobacco: Never Used  . Alcohol Use: No   OB History    No data available     Review of Systems  Gastrointestinal: Positive for vomiting.      Allergies  Iodinated diagnostic agents; Iodine; Janumet; Lisinopril; Tramadol; and Victoza  Home Medications   Prior to Admission medications   Medication Sig Start Date End Date Taking? Authorizing Provider  albuterol (PROAIR HFA) 108 (90 Base) MCG/ACT inhaler Inhale 1 puff into the lungs  every 6 (six) hours as needed for wheezing or shortness of breath.     Historical Provider, MD  amLODipine (NORVASC) 10 MG tablet Take 10 mg by mouth at bedtime.     Historical Provider, MD  atenolol (TENORMIN) 50 MG tablet Take 50 mg by mouth daily.    Historical Provider, MD  cholecalciferol (VITAMIN D) 400 units TABS tablet Take 800 Units by mouth daily.     Historical Provider, MD  Insulin Glargine (LANTUS) 100 UNIT/ML Solostar Pen Inject 25 Units into the skin daily at 10 pm. 12/31/15   Maryann Mikhail, DO  insulin lispro (HUMALOG KWIKPEN) 100 UNIT/ML KiwkPen 6 units per meal Use as sliding scale: CBG < 70: implement hypoglycemia protocol CBG 70 - 120: 0 units, CBG 121 - 150: 2 units, CBG 151 - 200: 3 units, CBG 201 - 250: 5 units, CBG 251 - 300: 8 units, CBG 301 - 350: 11 units, CBG 351 - 400: 15 units 12/31/15   Maryann Mikhail, DO  Multiple Vitamin (MULTI-VITAMINS) TABS Take 1 tablet by mouth daily.     Historical Provider, MD  nicotine (NICODERM CQ - DOSED IN MG/24 HOURS) 14 mg/24hr patch Place 1 patch (14 mg total) onto the skin daily. 12/31/15   Maryann Mikhail, DO  nystatin cream (MYCOSTATIN) Apply 1 application topically 3 (three) times daily.    Historical Provider, MD  Oxycodone HCl 10 MG TABS Take 10 mg  by mouth every 6 (six) hours as needed (pain).    Historical Provider, MD  PARoxetine (PAXIL) 40 MG tablet Take 40 mg by mouth every morning.    Historical Provider, MD  pregabalin (LYRICA) 225 MG capsule Take 225 mg by mouth 2 (two) times daily.    Historical Provider, MD  promethazine (PHENERGAN) 25 MG tablet Take 25 mg by mouth every 6 (six) hours as needed for nausea or vomiting.    Historical Provider, MD  ranitidine (ZANTAC) 150 MG capsule Take 300 mg by mouth 2 (two) times daily.    Historical Provider, MD  SitaGLIPtin-MetFORMIN HCl (JANUMET XR) [phone] MG TB24 Take 1 tablet by mouth daily.     Historical Provider, MD   BP 148/81 mmHg  Pulse 104  Temp(Src) 99.4 F (37.4 C)  (Oral)  Resp 17  SpO2 95% Physical Exam  Constitutional: She is oriented to person, place, and time. She appears well-developed and well-nourished.  HENT:  Head: Normocephalic and atraumatic.  Eyes: EOM are normal. Pupils are equal, round, and reactive to light.  Neck: Neck supple.  Cardiovascular: Normal rate, regular rhythm and normal heart sounds.   Pulmonary/Chest: Effort normal. No respiratory distress.  Abdominal: Soft. She exhibits no distension. There is no tenderness. There is no rebound and no guarding.  Musculoskeletal: She exhibits no edema or tenderness.  Neurological: She is alert and oriented to person, place, and time.  Skin: Skin is warm and dry.  Nursing note and vitals reviewed.   ED Course  Procedures (including critical care time) Labs Review Labs Reviewed  COMPREHENSIVE METABOLIC PANEL - Abnormal; Notable for the following:    Sodium 126 (*)    Potassium 3.2 (*)    Chloride 95 (*)    CO2 19 (*)    Glucose, Bld 295 (*)    Calcium 8.0 (*)    Albumin 2.8 (*)    AST 270 (*)    ALT 359 (*)    All other components within normal limits  URINE CULTURE  CBC WITH DIFFERENTIAL/PLATELET  URINALYSIS, ROUTINE W REFLEX MICROSCOPIC (NOT AT [facility])  I-STAT TROPOININ, ED    Imaging Review No results found. I have personally reviewed and evaluated these images and lab results as part of my medical decision-making.   EKG Interpretation   Date/Time:  Saturday January 31 2016 22:19:12 EST Ventricular Rate:  111 PR Interval:  139 QRS Duration: 95 QT Interval:  396 QTC Calculation: 538 R Axis:   110 Text Interpretation:  Sinus tachycardia Right axis deviation Prolonged QT  interval  Inferior lead has ST elevation- nonspecifc but new Confirmed by  Kathrynn Humble, MD, Florice Hindle [phone]) on 01/31/2016 10:29:49 PM    EKG Interpretation  Date/Time:  Saturday January 31 2016 22:32:53 EST Ventricular Rate:  108 PR Interval:  141 QRS Duration: 91 QT Interval:  416 QTC  Calculation: 558 R Axis:   106 Text Interpretation:  Sinus tachycardia Ventricular premature complex Aberrant complex Right axis deviation Prolonged QT interval unchanged ST changes in the inferior leads Confirmed by Chaunta Bejarano, MD, Thelma Comp ([id number]) on 01/31/2016 10:45:51 PM         MDM   Final diagnoses:  None    PT comes in with cc of nausea, emesis, diarrhea. Reports that emesis is coffee ground today. No blood otherwise. No melena. Pt has intermittent abd pain, but none right now. EKG showed mild ST changes in the inferior leads, no chest pain. No dynamic changes. Trops x  2 ordered. Will get basic  labs. Unsure why she has n/v/d - but gastrornteritis and SBO are on the ddx -the latter is lower due to her normal abd exam.  Plan is to get trops x 2, and PO challenge and repeat abd exam and repeat lytes. If pt is getting worse, might need CT scan. Dr. Betsey Holiday to take over care.   Varney Biles, MD 02/01/16 [phone]

## 2016-02-01 LAB — COMPREHENSIVE METABOLIC PANEL
ALK PHOS: 63 U/L (ref 38–126)
ALT: 359 U/L — ABNORMAL HIGH (ref 14–54)
ANION GAP: 12 (ref 5–15)
AST: 270 U/L — AB (ref 15–41)
Albumin: 2.8 g/dL — ABNORMAL LOW (ref 3.5–5.0)
BILIRUBIN TOTAL: 0.9 mg/dL (ref 0.3–1.2)
BUN: 17 mg/dL (ref 6–20)
CALCIUM: 8 mg/dL — AB (ref 8.9–10.3)
CO2: 19 mmol/L — ABNORMAL LOW (ref 22–32)
Chloride: 95 mmol/L — ABNORMAL LOW (ref 101–111)
Creatinine, Ser: 0.94 mg/dL (ref 0.44–1.00)
GFR calc Af Amer: >60 mL/min (ref >60)
GLUCOSE: 295 mg/dL — AB (ref 65–99)
POTASSIUM: 3.2 mmol/L — AB (ref 3.5–5.1)
Sodium: 126 mmol/L — ABNORMAL LOW (ref 135–145)

## 2016-02-01 LAB — BASIC METABOLIC PANEL
ANION GAP: 7 (ref 5–15)
BUN: 15 mg/dL (ref 6–20)
CHLORIDE: 100 mmol/L — AB (ref 101–111)
CO2: 21 mmol/L — ABNORMAL LOW (ref 22–32)
Calcium: 7.5 mg/dL — ABNORMAL LOW (ref 8.9–10.3)
Creatinine, Ser: 0.8 mg/dL (ref 0.44–1.00)
GFR calc Af Amer: >60 mL/min (ref >60)
Glucose, Bld: 247 mg/dL — ABNORMAL HIGH (ref 65–99)
POTASSIUM: 3.2 mmol/L — AB (ref 3.5–5.1)
SODIUM: 128 mmol/L — AB (ref 135–145)

## 2016-02-01 LAB — I-STAT TROPONIN, ED: TROPONIN I, POC: 0 ng/mL (ref 0.00–0.08)

## 2016-02-01 LAB — URINALYSIS, ROUTINE W REFLEX MICROSCOPIC
GLUCOSE, UA: 250 mg/dL — AB
KETONES UR: NEGATIVE mg/dL
Leukocytes, UA: NEGATIVE
Nitrite: NEGATIVE
PH: 5 (ref 5.0–8.0)
PROTEIN: 100 mg/dL — AB
Specific Gravity, Urine: 1.023 (ref 1.005–1.030)

## 2016-02-01 LAB — URINE MICROSCOPIC-ADD ON

## 2016-02-01 MED ORDER — MAGNESIUM SULFATE 2 GM/50ML IV SOLN
2.0000 g | Freq: Once | INTRAVENOUS | Status: AC
Start: 2016-02-01 — End: 2016-02-01
  Administered 2016-02-01: 2 g via INTRAVENOUS
  Filled 2016-02-01: qty 50

## 2016-02-01 MED ORDER — DIPHENOXYLATE-ATROPINE 2.5-0.025 MG PO TABS: 1.0000 | ORAL_TABLET | Freq: Four times a day (QID) | ORAL | Status: DC | PRN

## 2016-02-01 MED ORDER — POTASSIUM CHLORIDE CRYS ER 20 MEQ PO TBCR
40.0000 meq | EXTENDED_RELEASE_TABLET | Freq: Once | ORAL | Status: AC
  Administered 2016-02-01: 40 meq via ORAL
  Filled 2016-02-01: qty 2

## 2016-02-01 MED ORDER — METOCLOPRAMIDE HCL 5 MG/ML IJ SOLN
10.0000 mg | Freq: Once | INTRAMUSCULAR | Status: AC
  Administered 2016-02-01: 10 mg via INTRAVENOUS
  Filled 2016-02-01: qty 2

## 2016-02-01 MED ORDER — METOCLOPRAMIDE HCL 10 MG PO TABS: 10.0000 mg | ORAL_TABLET | Freq: Four times a day (QID) | ORAL | Status: DC

## 2016-02-01 NOTE — ED Provider Notes (Signed)
Patient signed out to me to follow progress after treatment for acute onset of nausea, vomiting and diarrhea. Patient had been given IV fluids. She had mild hypokalemia and hyponatremia. This was treated with IV fluids and potassium. Repeat work is improving. Patient continued to have some mild nausea after Zofran but this has resolved with Reglan. Patient feeling much improved, has tolerated oral intake. She is appropriate for discharge, further treatment as an outpatient with symptomatic treatment. Will empirically treat for influenza.  Orpah Greek, MD 02/01/16 [phone]

## 2016-02-01 NOTE — Discharge Instructions (Signed)
Diarrhea Diarrhea is frequent loose and watery bowel movements. It can cause you to feel weak and dehydrated. Dehydration can cause you to become tired and thirsty, have a dry mouth, and have decreased urination that often is dark yellow. Diarrhea is a sign of another problem, most often an infection that will not last long. In most cases, diarrhea typically lasts 2-3 days. However, it can last longer if it is a sign of something more serious. It is important to treat your diarrhea as directed by your caregiver to lessen or prevent future episodes of diarrhea. CAUSES  Some common causes include:  Gastrointestinal infections caused by viruses, bacteria, or parasites.  Food poisoning or food allergies.  Certain medicines, such as antibiotics, chemotherapy, and laxatives.  Artificial sweeteners and fructose.  Digestive disorders. HOME CARE INSTRUCTIONS  Ensure adequate fluid intake (hydration): Have 1 cup (8 oz) of fluid for each diarrhea episode. Avoid fluids that contain simple sugars or sports drinks, fruit juices, whole milk products, and sodas. Your urine should be clear or pale yellow if you are drinking enough fluids. Hydrate with an oral rehydration solution that you can purchase at pharmacies, retail stores, and online. You can prepare an oral rehydration solution at home by mixing the following ingredients together:   - tsp table salt.   tsp baking soda.   tsp salt substitute containing potassium chloride.  1  tablespoons sugar.  1 L (34 oz) of water.  Certain foods and beverages may increase the speed at which food moves through the gastrointestinal (GI) tract. These foods and beverages should be avoided and include:  Caffeinated and alcoholic beverages.  High-fiber foods, such as raw fruits and vegetables, nuts, seeds, and whole grain breads and cereals.  Foods and beverages sweetened with sugar alcohols, such as xylitol, sorbitol, and mannitol.  Some foods may be well  tolerated and may help thicken stool including:  Starchy foods, such as rice, toast, pasta, low-sugar cereal, oatmeal, grits, baked potatoes, crackers, and bagels.  Bananas.  Applesauce.  Add probiotic-rich foods to help increase healthy bacteria in the GI tract, such as yogurt and fermented milk products.  Wash your hands well after each diarrhea episode.  Only take over-the-counter or prescription medicines as directed by your caregiver.  Take a warm bath to relieve any burning or pain from frequent diarrhea episodes. SEEK IMMEDIATE MEDICAL CARE IF:   You are unable to keep fluids down.  You have persistent vomiting.  You have blood in your stool, or your stools are black and tarry.  You do not urinate in 6-8 hours, or there is only a small amount of very dark urine.  You have abdominal pain that increases or localizes.  You have weakness, dizziness, confusion, or light-headedness.  You have a severe headache.  Your diarrhea gets worse or does not get better.  You have a fever or persistent symptoms for more than 2-3 days.  You have a fever and your symptoms suddenly get worse. MAKE SURE YOU:   Understand these instructions.  Will watch your condition.  Will get help right away if you are not doing well or get worse.   This information is not intended to replace advice given to you by your health care provider. Make sure you discuss any questions you have with your health care provider.   Document Released: 11/12/2002 Document Revised: 12/13/2014 Document Reviewed: 07/30/2012 Elsevier Interactive Patient Education 2016 Else[employer]  Nausea and Vomiting Nausea is a sick feeling that often comes  before throwing up (vomiting). Vomiting is a reflex where stomach contents come out of your mouth. Vomiting can cause severe loss of body fluids (dehydration). Children and elderly adults can become dehydrated quickly, especially if they also have diarrhea. Nausea and  vomiting are symptoms of a condition or disease. It is important to find the cause of your symptoms. CAUSES   Direct irritation of the stomach lining. This irritation can result from increased acid production (gastroesophageal reflux disease), infection, food poisoning, taking certain medicines (such as nonsteroidal anti-inflammatory drugs), alcohol use, or tobacco use.  Signals from the brain.These signals could be caused by a headache, heat exposure, an inner ear disturbance, increased pressure in the brain from injury, infection, a tumor, or a concussion, pain, emotional stimulus, or metabolic problems.  An obstruction in the gastrointestinal tract (bowel obstruction).  Illnesses such as diabetes, hepatitis, gallbladder problems, appendicitis, kidney problems, cancer, sepsis, atypical symptoms of a heart attack, or eating disorders.  Medical treatments such as chemotherapy and radiation.  Receiving medicine that makes you sleep (general anesthetic) during surgery. DIAGNOSIS Your caregiver may ask for tests to be done if the problems do not improve after a few days. Tests may also be done if symptoms are severe or if the reason for the nausea and vomiting is not clear. Tests may include:  Urine tests.  Blood tests.  Stool tests.  Cultures (to look for evidence of infection).  X-rays or other imaging studies. Test results can help your caregiver make decisions about treatment or the need for additional tests. TREATMENT You need to stay well hydrated. Drink frequently but in small amounts.You may wish to drink water, sports drinks, clear broth, or eat frozen ice pops or gelatin dessert to help stay hydrated.When you eat, eating slowly may help prevent nausea.There are also some antinausea medicines that may help prevent nausea. HOME CARE INSTRUCTIONS   Take all medicine as directed by your caregiver.  If you do not have an appetite, do not force yourself to eat. However, you must  continue to drink fluids.  If you have an appetite, eat a normal diet unless your caregiver tells you differently.  Eat a variety of complex carbohydrates (rice, wheat, potatoes, bread), lean meats, yogurt, fruits, and vegetables.  Avoid high-fat foods because they are more difficult to digest.  Drink enough water and fluids to keep your urine clear or pale yellow.  If you are dehydrated, ask your caregiver for specific rehydration instructions. Signs of dehydration may include:  Severe thirst.  Dry lips and mouth.  Dizziness.  Dark urine.  Decreasing urine frequency and amount.  Confusion.  Rapid breathing or pulse. SEEK IMMEDIATE MEDICAL CARE IF:   You have blood or brown flecks (like coffee grounds) in your vomit.  You have black or bloody stools.  You have a severe headache or stiff neck.  You are confused.  You have severe abdominal pain.  You have chest pain or trouble breathing.  You do not urinate at least once every 8 hours.  You develop cold or clammy skin.  You continue to vomit for longer than 24 to 48 hours.  You have a fever. MAKE SURE YOU:   Understand these instructions.  Will watch your condition.  Will get help right away if you are not doing well or get worse.   This information is not intended to replace advice given to you by your health care provider. Make sure you discuss any questions you have with your health care  provider.   Document Released: 11/22/2005 Document Revised: 02/14/2012 Document Reviewed: 04/21/2011 Elsevier Interactive Patient Education [employer].

## 2016-02-02 LAB — URINE CULTURE

## 2016-06-09 ENCOUNTER — Encounter (HOSPITAL_COMMUNITY): Payer: Self-pay | Admitting: Family Medicine

## 2016-06-09 ENCOUNTER — Ambulatory Visit (HOSPITAL_COMMUNITY)
Admission: EM | Admit: 2016-06-09 | Discharge: 2016-06-09 | Disposition: A | Discharge status: Home / Self Care | Payer: Medicare HMO | Attending: Emergency Medicine | Admitting: Emergency Medicine

## 2016-06-09 DIAGNOSIS — L03031 Cellulitis of right toe: Secondary | ICD-10-CM

## 2016-06-09 MED ORDER — SULFAMETHOXAZOLE-TRIMETHOPRIM 800-160 MG PO TABS: 1.0000 | ORAL_TABLET | Freq: Two times a day (BID) | ORAL | Status: AC

## 2016-06-09 NOTE — ED Notes (Signed)
Pt here for swelling to right great toe with bleeding and possible infection. sts that she has been treating herself for toe fungus and cut toenail too short. She has had 2 bleeding episodes that leave pools of blood. sts that she feels weak, nauseous, and lightheaded.

## 2016-06-09 NOTE — Discharge Instructions (Signed)
Start Bactrim (antibiotic) as directed. Need to follow-up with a Podiatrist for further evaluation within the next week.   Paronychia Paronychia is an infection of the skin that surrounds a nail. It usually affects the skin around a fingernail, but it may also occur near a toenail. It often causes pain and swelling around the nail. This condition may come on suddenly or develop over a longer period. In some cases, a collection of pus (abscess) can form near or under the nail. Usually, paronychia is not serious and it clears up with treatment. CAUSES This condition may be caused by bacteria or fungi. It is commonly caused by either Streptococcus or Staphylococcus bacteria. The bacteria or fungi often cause the infection by getting into the affected area through an opening in the skin, such as a cut or a hangnail. RISK FACTORS This condition is more likely to develop in:  People who get their hands wet often, such as those who work as Designer, industrial/product, bartenders, or nurses.  People who bite their fingernails or suck their thumbs.  People who trim their nails too short.  People who have hangnails or injured fingertips.  People who get manicures.  People who have diabetes. SYMPTOMS Symptoms of this condition include:  Redness and swelling of the skin near the nail.  Tenderness around the nail when you touch the area.  Pus-filled bumps under the cuticle. The cuticle is the skin at the base or sides of the nail.  Fluid or pus under the nail.  Throbbing pain in the area. DIAGNOSIS This condition is usually diagnosed with a physical exam. In some cases, a sample of pus may be taken from an abscess to be tested in a lab. This can help to determine what type of bacteria or fungi is causing the condition. TREATMENT Treatment for this condition depends on the cause and severity of the condition. If the condition is mild, it may clear up on its own in a few days. Your health care provider may  recommend soaking the affected area in warm water a few times a day. When treatment is needed, the options may include:  Antibiotic medicine, if the condition is caused by a bacterial infection.  Antifungal medicine, if the condition is caused by a fungal infection.  Incision and drainage, if an abscess is present. In this procedure, the health care provider will cut open the abscess so the pus can drain out. HOME CARE INSTRUCTIONS  Soak the affected area in warm water if directed to do so by your health care provider. You may be told to do this for 20 minutes, 2-3 times a day. Keep the area dry in between soakings.  Take medicines only as directed by your health care provider.  If you were prescribed an antibiotic medicine, finish all of it even if you start to feel better.  Keep the affected area clean.  Do not try to drain a fluid-filled bump yourself.  If you will be washing dishes or performing other tasks that require your hands to get wet, wear rubber gloves. You should also wear gloves if your hands might come in contact with irritating substances, such as cleaners or chemicals.  Follow your health care provider's instructions about:  Wound care.  Bandage (dressing) changes and removal. SEEK MEDICAL CARE IF:  Your symptoms get worse or do not improve with treatment.  You have a fever or chills.  You have redness spreading from the affected area.  You have continued or increased fluid,  blood, or pus coming from the affected area.  Your finger or knuckle becomes swollen or is difficult to move.   This information is not intended to replace advice given to you by your health care provider. Make sure you discuss any questions you have with your health care provider.   Document Released: 05/18/2001 Document Revised: 04/08/2015 Document Reviewed: 10/30/2014 Elsevier Interactive Patient Education [employer].

## 2016-06-10 NOTE — ED Provider Notes (Signed)
CSN: [id number]     Arrival date & time 06/09/16  0957 History   First MD Initiated Contact with Patient 06/09/16 1036     Chief Complaint  Patient presents with  . Toe Pain   (Consider location/radiation/quality/duration/timing/severity/associated sxs/prior Treatment) HPI Comments: Patient presents with right great toe swelling, pain and bleeding from nail for the past 2 days. She has a history of toe nail fungus on multiple toe nails that is not being treated yet. She tried to trim her big toe nail and cut skin on edge of nail. Is diabetic and area continues to bleed and now toe is more swollen and painful. Took Aspirin and Ibuprofen for pain in the past 2 days which has made the bleeding worse.   Patient is a 55 y.o. female presenting with toe pain. The history is provided by the patient and a relative.  Toe Pain This is a new problem. The current episode started 2 days ago. The problem occurs constantly. The problem has been gradually worsening. She has tried a warm compress for the symptoms. The treatment provided no relief.    Past Medical History  Diagnosis Date  . Diabetes mellitus without complication ([place])   . Hypertension   . Autoimmune hepatitis ([place])   . Lupus ([place])   . Shortness of breath dyspnea   . GERD (gastroesophageal reflux disease)    Past Surgical History  Procedure Laterality Date  . Cholecystectomy    . Dilation and curettage of uterus     Family History  Problem Relation Age of Onset  . Diabetes Father   . Hypertension Father    Social History  Substance Use Topics  . Smoking status: Current Every Day Smoker -- 1.00 packs/day for 30 years    Types: Cigarettes  . Smokeless tobacco: Never Used  . Alcohol Use: No   OB History    No data available     Review of Systems  Constitutional: Negative for fever.  Skin: Positive for wound.    Allergies  Iodinated diagnostic agents; Iodine; Janumet; Lisinopril; Tramadol; and Victoza  Home Medications    Prior to Admission medications   Medication Sig Start Date End Date Taking? Authorizing Provider  albuterol (PROAIR HFA) 108 (90 Base) MCG/ACT inhaler Inhale 1 puff into the lungs every 6 (six) hours as needed for wheezing or shortness of breath.     Historical Provider, MD  amLODipine (NORVASC) 10 MG tablet Take 10 mg by mouth at bedtime.     Historical Provider, MD  atenolol (TENORMIN) 50 MG tablet Take 50 mg by mouth daily.    Historical Provider, MD  cholecalciferol (VITAMIN D) 400 units TABS tablet Take 800 Units by mouth daily.     Historical Provider, MD  diphenoxylate-atropine (LOMOTIL) 2.5-0.025 MG tablet Take 1 tablet by mouth 4 (four) times daily as needed for diarrhea or loose stools. 02/01/16   Orpah Greek, MD  Insulin Glargine (LANTUS) 100 UNIT/ML Solostar Pen Inject 25 Units into the skin daily at 10 pm. 12/31/15   Maryann Mikhail, DO  insulin lispro (HUMALOG KWIKPEN) 100 UNIT/ML KiwkPen 6 units per meal Use as sliding scale: CBG < 70: implement hypoglycemia protocol CBG 70 - 120: 0 units, CBG 121 - 150: 2 units, CBG 151 - 200: 3 units, CBG 201 - 250: 5 units, CBG 251 - 300: 8 units, CBG 301 - 350: 11 units, CBG 351 - 400: 15 units 12/31/15   Maryann Mikhail, DO  metoCLOPramide (REGLAN) 10 MG tablet  Take 1 tablet (10 mg total) by mouth every 6 (six) hours. 02/01/16   Orpah Greek, MD  Multiple Vitamin (MULTI-VITAMINS) TABS Take 1 tablet by mouth daily.     Historical Provider, MD  nicotine (NICODERM CQ - DOSED IN MG/24 HOURS) 14 mg/24hr patch Place 1 patch (14 mg total) onto the skin daily. 12/31/15   Maryann Mikhail, DO  nystatin cream (MYCOSTATIN) Apply 1 application topically 3 (three) times daily.    Historical Provider, MD  Oxycodone HCl 10 MG TABS Take 10 mg by mouth every 6 (six) hours as needed (pain).    Historical Provider, MD  PARoxetine (PAXIL) 40 MG tablet Take 40 mg by mouth every morning.    Historical Provider, MD  pregabalin (LYRICA) 225 MG capsule  Take 225 mg by mouth 2 (two) times daily.    Historical Provider, MD  promethazine (PHENERGAN) 25 MG tablet Take 25 mg by mouth every 6 (six) hours as needed for nausea or vomiting.    Historical Provider, MD  ranitidine (ZANTAC) 150 MG capsule Take 300 mg by mouth 2 (two) times daily.    Historical Provider, MD  SitaGLIPtin-MetFORMIN HCl (JANUMET XR) [phone] MG TB24 Take 1 tablet by mouth daily.     Historical Provider, MD  sulfamethoxazole-trimethoprim (BACTRIM DS,SEPTRA DS) 800-160 MG tablet Take 1 tablet by mouth 2 (two) times daily. For 10 days 06/09/16 06/16/16  Katy Apo, NP   Meds Ordered and Administered this Visit  Medications - No data to display  BP 157/105 mmHg  Pulse 75  Temp(Src) 98.2 F (36.8 C)  Resp 18  SpO2 95% No data found.   Physical Exam  Constitutional: She is oriented to person, place, and time. She appears well-developed and well-nourished.  Musculoskeletal:       Feet:  Right big toe with half of upper nail removed. Nail is brown and raised from bed. Dried blood present along around nailbed. Very tender. Swelling and some bruising present at base of nail. Has full range of motion of toe and good sensation.   Neurological: She is alert and oriented to person, place, and time. She has normal strength. No sensory deficit.  Skin: Skin is warm and dry.    ED Course  Procedures (including critical care time)  Labs Review Labs Reviewed - No data to display  Imaging Review No results found.   Visual Acuity Review  Right Eye Distance:   Left Eye Distance:   Bilateral Distance:    Right Eye Near:   Left Eye Near:    Bilateral Near:         MDM   1. Infected nailbed of toe, right    Cleaned area with DermaWound cleaner, applied Bacitracin ointment and covered with gauze. Recommend start Bactrim DS twice a day for 10 days. Patient needs to see Podiatrist- daughter was making an appointment for this week while patient was still in room.  Encouraged not to take Aspirin or Ibuprofen as this will increase bleeding. Patient takes Oxycodone for pain and will continue this to help with toe pain. Follow-up with Podiatrist as planned or go to ER if redness, swelling or pain worsens before seeing Podiatrist.     Katy Apo, NP 06/10/16 [phone]

## 2016-06-25 ENCOUNTER — Ambulatory Visit: Payer: Medicare HMO | Admitting: Podiatry

## 2016-06-30 ENCOUNTER — Ambulatory Visit (INDEPENDENT_AMBULATORY_CARE_PROVIDER_SITE_OTHER): Payer: Medicare HMO | Admitting: Podiatry

## 2016-06-30 VITALS — BP 131/79 | HR 83 | Resp 16

## 2016-06-30 DIAGNOSIS — B351 Tinea unguium: Secondary | ICD-10-CM

## 2016-06-30 DIAGNOSIS — E119 Type 2 diabetes mellitus without complications: Secondary | ICD-10-CM | POA: Diagnosis not present

## 2016-06-30 DIAGNOSIS — M79676 Pain in unspecified toe(s): Secondary | ICD-10-CM | POA: Diagnosis not present

## 2016-06-30 NOTE — Progress Notes (Signed)
This patient presents to the office concerned about her long thick nails. 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 She also has been experiencing a lupus flare. She presents the office today stating that she had problem on July 4. When the big toe on the right foot started to bleed. She says that she cut the nail short and it started to bleed and she  went to the emergency care., S was treated with antibiotics since t. The toe has since  resolved and is not causing any pain or discomfort.  Due to her medical history. She presents to the office for an evaluation of her toes. She also admits that she has been treating her nails with Vicks VapoRub in an effort to thin her thickened nails.  GENERAL APPEARANCE: Alert, conversant. Appropriately groomed. No acute distress.  VASCULAR: Pedal pulses are  palpable at  [facility] and PT bilateral.  Capillary refill time is immediate to all digits,  Normal temperature gradient.    NEUROLOGIC: sensation is normal to 5.07 monofilament at 5/5 sites bilateral.  Light touch is intact bilateral, Muscle strength normal.  MUSCULOSKELETAL: acceptable muscle strength, tone and stability bilateral.  Intrinsic muscluature intact bilateral.  HAV 1st MPJ  B/L   DERMATOLOGIC: skin color, texture, and turgor are within normal limits.  No preulcerative lesions or ulcers  are seen, no interdigital maceration noted.  No open lesions present.      Diagnosis  Onychomycosis  DM     IE  Debridement of onychomycotic nails  B/L   RTC 3 months   Gardiner Barefoot DPM

## 2016-07-28 IMAGING — DX DG CHEST 2V
2 series · 2 of 2 positions shown · non-contrast
Comparison: 11/05/2015

CLINICAL DATA: Worsening chest pain, shortness of breath, cough,
fever. Left lower quadrant abdominal pain.

EXAM:
CHEST  2 VIEW

[chest pa]
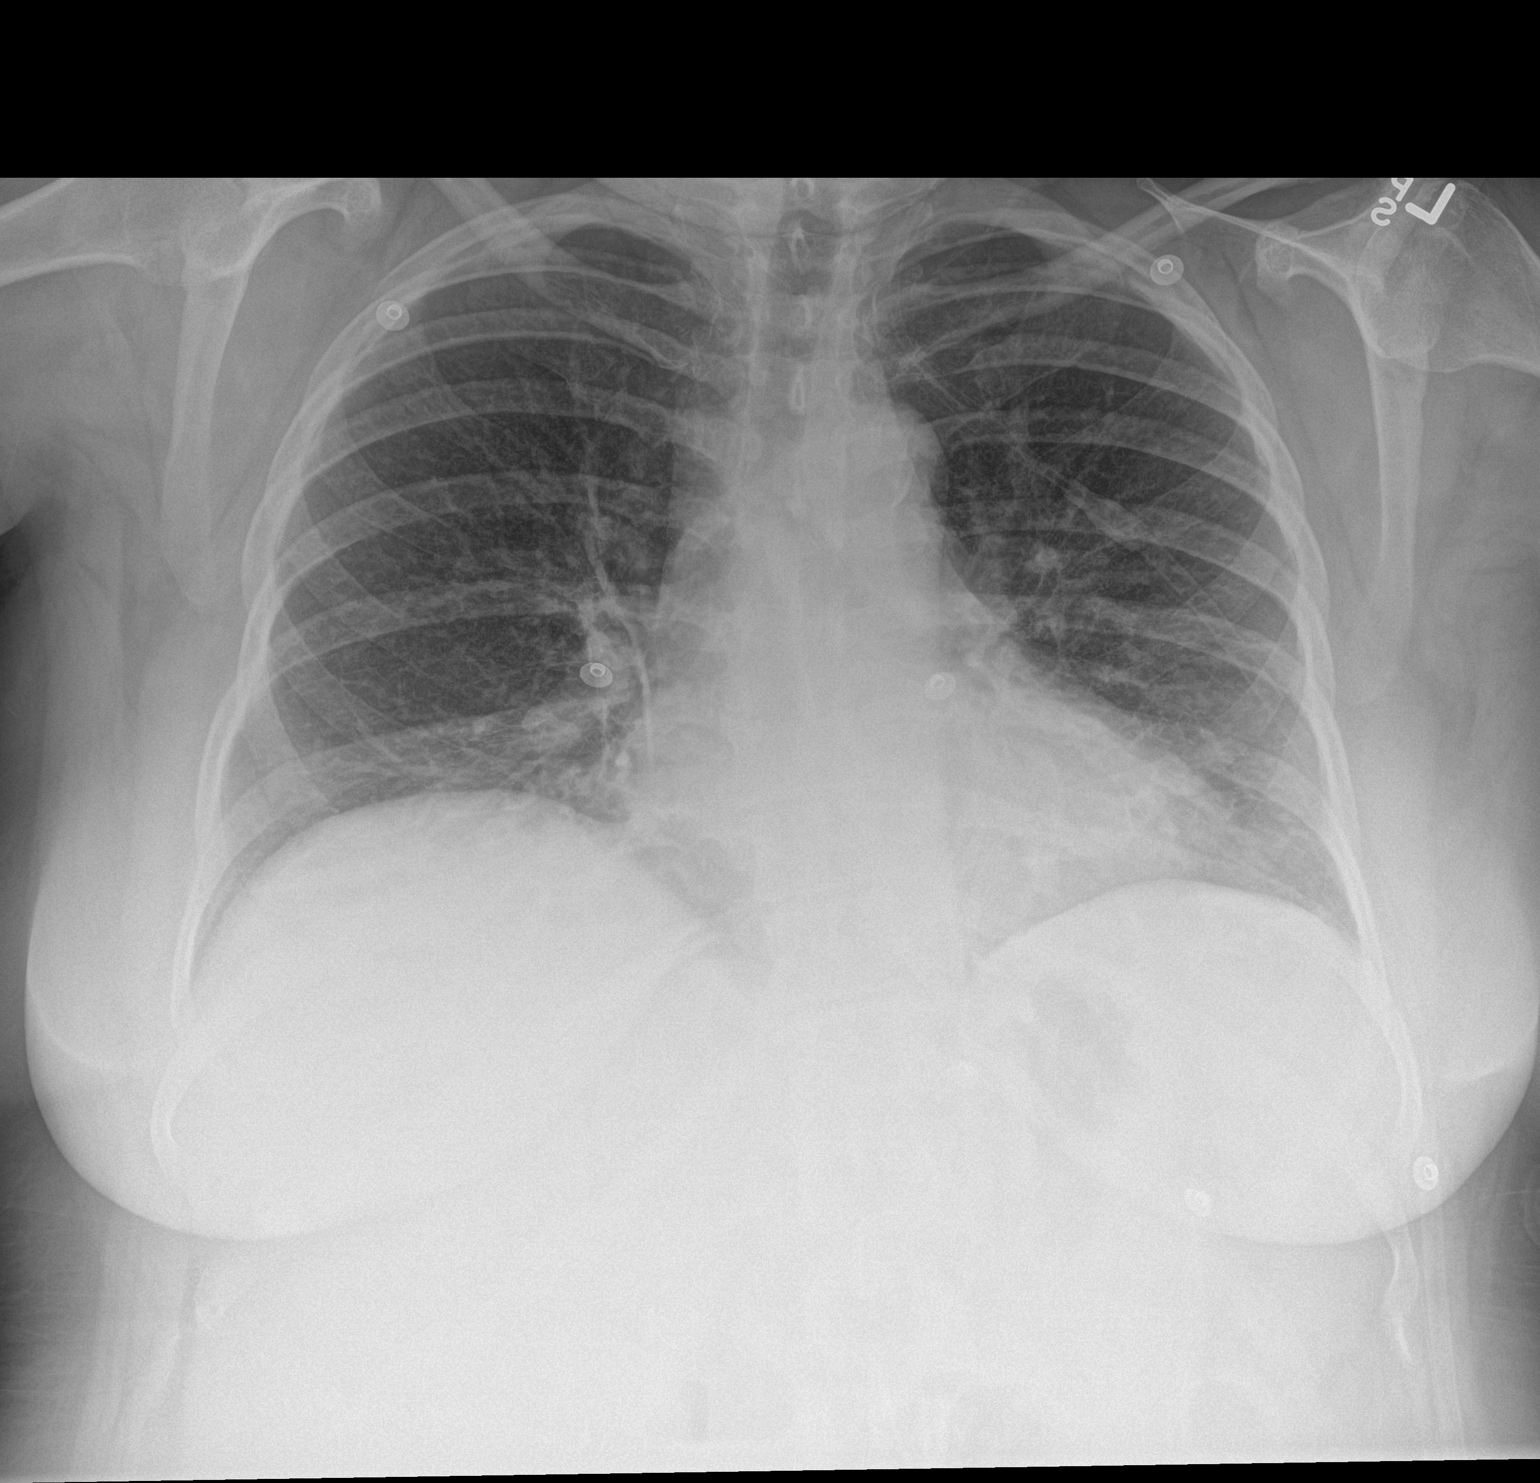

[chest lat]
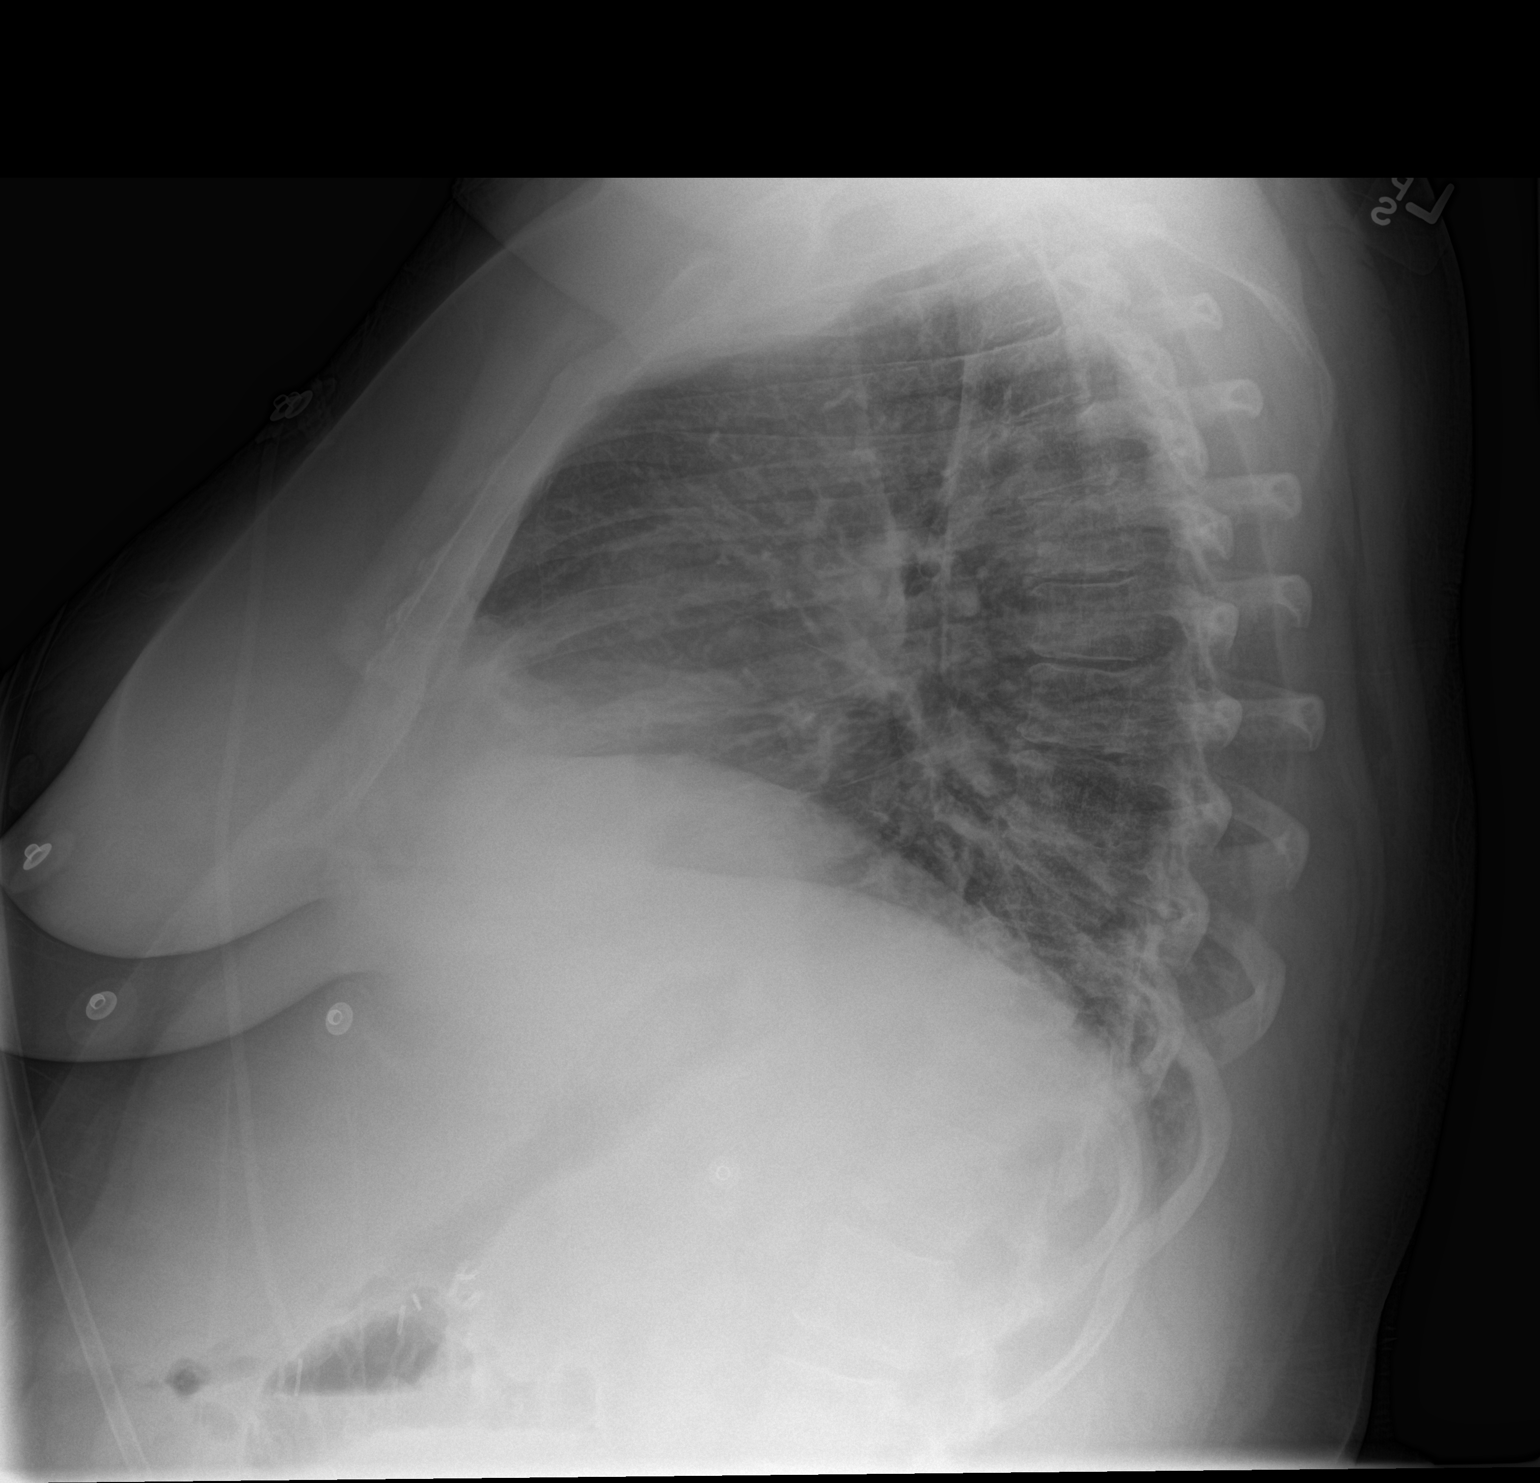

[2 of 2 positions shown; findings below may reference images not displayed]

FINDINGS: Low lung volumes with bibasilar opacities, likely atelectasis. Heart
is normal size. No effusions or acute bony abnormality.
IMPRESSION: Low lung volumes with bibasilar atelectasis.

## 2016-08-31 ENCOUNTER — Encounter (HOSPITAL_COMMUNITY): Payer: Self-pay | Admitting: Emergency Medicine

## 2016-08-31 ENCOUNTER — Emergency Department (HOSPITAL_COMMUNITY)
Admission: EM | Admit: 2016-08-31 | Discharge: 2016-09-01 | Disposition: A | Discharge status: Home / Self Care | Payer: Medicare HMO | Attending: Emergency Medicine | Admitting: Emergency Medicine

## 2016-08-31 DIAGNOSIS — Z7951 Long term (current) use of inhaled steroids: Secondary | ICD-10-CM | POA: Diagnosis not present

## 2016-08-31 DIAGNOSIS — I10 Essential (primary) hypertension: Secondary | ICD-10-CM | POA: Diagnosis not present

## 2016-08-31 DIAGNOSIS — E119 Type 2 diabetes mellitus without complications: Secondary | ICD-10-CM | POA: Insufficient documentation

## 2016-08-31 DIAGNOSIS — M7989 Other specified soft tissue disorders: Secondary | ICD-10-CM | POA: Diagnosis not present

## 2016-08-31 DIAGNOSIS — Z794 Long term (current) use of insulin: Secondary | ICD-10-CM | POA: Diagnosis not present

## 2016-08-31 DIAGNOSIS — F17210 Nicotine dependence, cigarettes, uncomplicated: Secondary | ICD-10-CM | POA: Insufficient documentation

## 2016-08-31 DIAGNOSIS — M79604 Pain in right leg: Secondary | ICD-10-CM | POA: Insufficient documentation

## 2016-08-31 DIAGNOSIS — Z79899 Other long term (current) drug therapy: Secondary | ICD-10-CM | POA: Diagnosis not present

## 2016-08-31 NOTE — ED Notes (Signed)
Bed: WA06 Expected date:  Expected time:  Means of arrival:  Comments: 55 yo F leg pain

## 2016-08-31 NOTE — ED Triage Notes (Signed)
Pt BIB EMS for right leg pain that radiates from mid-calf up to groin; pain was not present last week, but Pt had doppler study on Thursday due to swelling in right leg; study was negative and pt and was referred to vascular surgeon; pt states she has had swelling like this before but never pain; on prescription for amoxicillin for strep throat

## 2016-09-01 MED ORDER — HYDROCODONE-ACETAMINOPHEN 5-325 MG PO TABS: 1.0000 | ORAL_TABLET | Freq: Four times a day (QID) | ORAL | 0 refills | Status: DC | PRN

## 2016-09-01 MED ORDER — OXYCODONE-ACETAMINOPHEN 5-325 MG PO TABS
1.0000 | ORAL_TABLET | Freq: Once | ORAL | Status: AC
  Administered 2016-09-01: 1 via ORAL
  Filled 2016-09-01: qty 1

## 2016-09-01 NOTE — ED Provider Notes (Signed)
[facility] DEPT Provider Note   CSN: [id number] Arrival date & time: 08/31/16  2155  By signing my name below, I, Monica Ellis, attest that this documentation has been prepared under the direction and in the presence of Monica Moras, PA-C Electronically Signed: Soijett Ellis, ED Scribe. 09/01/16. 1:13 AM.   History   Chief Complaint Chief Complaint  Patient presents with  . Leg Pain    HPI Monica Ellis is a 55 y.o. female with a PMHx of DM, lupus, autoimmune hepatitis, who presents to the Emergency Department brought in by EMS complaining of right leg pain onset 2 weeks worsening tonight. Pt states that tonight she attempted to stand and ambulate tonight and noticed immediate sharp pain to her right leg pain. Pt notes that her right leg pain radiates from her right mid-calf to her right groin. Pt reports that her right leg pain is worsened with ambulation and denies alleviating factors for her right leg pain. Pt states that she had a doppler study completed 5 days ago that returned negative and she was referred to a vascular surgeon with her appointment being 09/06/2016. Pt reports that she has had 2 other episodes of right leg swelling similar to her current episode, but denies having right leg pain with her past episodes. Pt is having associated symptoms of gait problem and right lower leg swelling. She notes that she has tried ice and elevation with no relief of her symptoms. She denies color change, wound, rash, numbness, back pain, bowel/bladder incontinence, and any other symptoms. Pt denies issues with her kidneys or hx of heart failures.    The history is provided by the patient. No language interpreter was used.    Past Medical History:  Diagnosis Date  . Autoimmune hepatitis ([place])   . Diabetes mellitus without complication ([place])   . GERD (gastroesophageal reflux disease)   . Hypertension   . Lupus ([place])   . Shortness of breath dyspnea     Patient Active Problem List    Diagnosis Date Noted  . Abnormal LFTs   . Pain in the chest   . Diabetes mellitus with complication ([place])   . Hypoxia 12/29/2015  . Uncontrolled type 2 diabetes mellitus ([place]) 12/29/2015  . Dyspnea 12/29/2015  . LLQ pain 12/29/2015  . Lupus ([place]) 12/29/2015  . Chest pain 12/29/2015  . Transaminitis 12/29/2015  . Chronic pain 12/29/2015  . Depression 12/29/2015    Past Surgical History:  Procedure Laterality Date  . CHOLECYSTECTOMY    . DILATION AND CURETTAGE OF UTERUS      OB History    No data available       Home Medications    Prior to Admission medications   Medication Sig Start Date End Date Taking? Authorizing Provider  albuterol (PROAIR HFA) 108 (90 Base) MCG/ACT inhaler Inhale 1 puff into the lungs every 6 (six) hours as needed for wheezing or shortness of breath.    Yes Historical Provider, MD  amLODipine (NORVASC) 10 MG tablet Take 10 mg by mouth at bedtime.    Yes Historical Provider, MD  amoxicillin (AMOXIL) 875 MG tablet Take 1 tablet by mouth 2 (two) times daily. 08/25/16  Yes Historical Provider, MD  budesonide (ENTOCORT EC) 3 MG 24 hr capsule Take 3 capsules by mouth daily. 08/26/16  Yes Historical Provider, MD  DULoxetine (CYMBALTA) 60 MG capsule Take 1 capsule by mouth daily. 08/26/16  Yes Historical Provider, MD  insulin aspart (NOVOLOG) 100 UNIT/ML injection Inject 20 Units into the  skin 3 (three) times daily before meals.   Yes Historical Provider, MD  Insulin Aspart Prot & Aspart (NOVOLOG MIX 70/30 FLEXPEN [place]) Inject 60 Units into the skin 2 (two) times daily.   Yes Historical Provider, MD  LYRICA 200 MG capsule Take 1 capsule by mouth daily. 07/14/16  Yes Historical Provider, MD  magnesium 30 MG tablet Take 30 mg by mouth daily.   Yes Historical Provider, MD  mercaptopurine (PURINETHOL) 50 MG tablet Take 1 tablet by mouth 2 (two) times daily. 08/31/16  Yes Historical Provider, MD  metFORMIN (GLUCOPHAGE) 500 MG tablet Take 500 mg by mouth 2 (two) times  daily with a meal.   Yes Historical Provider, MD  Oxycodone HCl 10 MG TABS Take 10 mg by mouth every 6 (six) hours as needed (pain).   Yes Historical Provider, MD  potassium chloride SA (K-DUR,KLOR-CON) 20 MEQ tablet Take 20 mEq by mouth 2 (two) times daily.   Yes Historical Provider, MD  predniSONE (DELTASONE) 20 MG tablet Take 20 mg by mouth 2 (two) times daily with a meal.  07/20/16  Yes Historical Provider, MD  ranitidine (ZANTAC) 150 MG capsule Take 300 mg by mouth 2 (two) times daily.   Yes Historical Provider, MD  traZODone (DESYREL) 50 MG tablet Take 4 tablets by mouth daily as needed for sleep.  08/25/16  Yes Historical Provider, MD  Vitamin D, Ergocalciferol, (DRISDOL) 50000 units CAPS capsule Take 50,000 Units by mouth every 7 (seven) days.   Yes Historical Provider, MD  diphenoxylate-atropine (LOMOTIL) 2.5-0.025 MG tablet Take 1 tablet by mouth 4 (four) times daily as needed for diarrhea or loose stools. Patient not taking: Reported on 08/31/2016 02/01/16   Orpah Greek, MD  Insulin Glargine (LANTUS) 100 UNIT/ML Solostar Pen Inject 25 Units into the skin daily at 10 pm. Patient not taking: Reported on 08/31/2016 12/31/15   Velta Addison Mikhail, DO  insulin lispro (HUMALOG KWIKPEN) 100 UNIT/ML KiwkPen 6 units per meal Use as sliding scale: CBG < 70: implement hypoglycemia protocol CBG 70 - 120: 0 units, CBG 121 - 150: 2 units, CBG 151 - 200: 3 units, CBG 201 - 250: 5 units, CBG 251 - 300: 8 units, CBG 301 - 350: 11 units, CBG 351 - 400: 15 units Patient not taking: Reported on 08/31/2016 12/31/15   Velta Addison Mikhail, DO  metoCLOPramide (REGLAN) 10 MG tablet Take 1 tablet (10 mg total) by mouth every 6 (six) hours. Patient not taking: Reported on 08/31/2016 02/01/16   Orpah Greek, MD  nicotine (NICODERM CQ - DOSED IN MG/24 HOURS) 14 mg/24hr patch Place 1 patch (14 mg total) onto the skin daily. Patient not taking: Reported on 08/31/2016 12/31/15   Cristal Ford, DO    Family  History Family History  Problem Relation Age of Onset  . Diabetes Father   . Hypertension Father     Social History Social History  Substance Use Topics  . Smoking status: Current Every Day Smoker    Packs/day: 1.00    Years: 30.00    Types: Cigarettes  . Smokeless tobacco: Never Used  . Alcohol use No     Allergies   Ace inhibitors; Lisinopril; Tramadol; Iodinated diagnostic agents; Iodine; Janumet [sitagliptin-metformin hcl]; and Victoza [liraglutide]   Review of Systems Review of Systems  Gastrointestinal:       No bowel incontinence.   Genitourinary:       No bladder incontinence.  Musculoskeletal: Positive for gait problem (due to pain), joint swelling (right lower  leg) and myalgias (right lower leg). Negative for back pain.  Skin: Negative for color change, rash and wound.  Neurological: Negative for numbness.  All other systems reviewed and are negative.    Physical Exam Updated Vital Signs BP 162/94   Pulse 89   Temp 98.7 F (37.1 C) (Oral)   Resp 14   Ht _0  (1.651 m)   Wt 290 lb (131.5 kg)   SpO2 94%   BMI 48.26 kg/m   Physical Exam  Constitutional: She is oriented to person, place, and time. She appears well-developed and well-nourished. No distress.  HENT:  Head: Normocephalic and atraumatic.  Eyes: EOM are normal.  Neck: Neck supple.  Cardiovascular: Normal rate, regular rhythm and normal heart sounds.  Exam reveals no gallop and no friction rub.   No murmur heard. Pulmonary/Chest: Effort normal and breath sounds normal. No respiratory distress. She has no wheezes. She has no rales.  Abdominal: Soft. She exhibits no distension. There is no tenderness.  Musculoskeletal: Normal range of motion.       Right lower leg: She exhibits edema. She exhibits no tenderness.       Right foot: There is tenderness.  Right lower extremity with 2+ pitting edema noted with palpable dorsalis pedis pulses and brisk cap refill to all toes. Tenderness to the  sole of right foot without any skin changes or signs of infection. Legs without palpable cords or erythema. Right calf is non-tender. No significant midline spinal tenderness.  Neurological: She is alert and oriented to person, place, and time.  Skin: Skin is warm and dry.  Psychiatric: She has a normal mood and affect. Her behavior is normal.  Nursing note and vitals reviewed.    ED Treatments / Results  DIAGNOSTIC STUDIES: Oxygen Saturation is 94% on RA, adequate, by my interpretation.    COORDINATION OF CARE: 1:13 AM Discussed treatment plan with pt at bedside which includes pain medication Rx, and pt agreed to plan.   Procedures Procedures (including critical care time)  Medications Ordered in ED Medications - No data to display   Initial Impression / Assessment and Plan / ED Course  I have reviewed the triage vital signs and the nursing notes.   Clinical Course    BP 153/97   Pulse 86   Temp 98.7 F (37.1 C) (Oral)   Resp 14   Ht _1  (1.651 m)   Wt 131.5 kg   SpO2 97%   BMI 48.26 kg/m    Final Clinical Impressions(s) / ED Diagnoses   Final diagnoses:  Right leg pain  Right leg swelling    New Prescriptions New Prescriptions   HYDROCODONE-ACETAMINOPHEN (NORCO/VICODIN) 5-325 MG TABLET    Take 1-2 tablets by mouth every 6 (six) hours as needed for moderate pain.    I personally performed the services described in this documentation, which was scribed in my presence. The recorded information has been reviewed and is accurate.   Pt here with pain to R leg along with R leg swelling.  Report having leg swelling x 2 weeks which she has been evaluated by her PCP, and had a negative venous doppler study within the past week that was negative for DVT.  She is schedule to be seen and evaluated by vascular specialist and hematologist in the near future.  She's having shooting pain that started today.  On exam, she does have pitting edema to her R leg, more prominent  than L leg. She is NVI, no  evidence to suggest septic joint or cellulitis.  No radicular pain.  Has hx of Lupus.  I do not think xray is indicated at this time.  I would provide a short course of pain medication but encourage pt to f/u closely with her specialist for further care.  Return precaution discussed.      Monica Moras, PA-C 70/[id number]/37 [id number]    Delora Fuel, MD 15/17/61 [id number]

## 2016-09-01 NOTE — Discharge Instructions (Signed)
Please follow up closely with your specialist for further evaluation of your right leg swelling and pain.  Return to the ER if your condition worsen or if you have other concerns.

## 2016-09-02 ENCOUNTER — Emergency Department (HOSPITAL_COMMUNITY)
Admission: EM | Admit: 2016-09-02 | Discharge: 2016-09-02 | Disposition: A | Discharge status: Home / Self Care | Payer: Medicare HMO | Attending: Dermatology | Admitting: Dermatology

## 2016-09-02 ENCOUNTER — Encounter (HOSPITAL_COMMUNITY): Payer: Self-pay | Admitting: Emergency Medicine

## 2016-09-02 DIAGNOSIS — I10 Essential (primary) hypertension: Secondary | ICD-10-CM | POA: Insufficient documentation

## 2016-09-02 DIAGNOSIS — F17210 Nicotine dependence, cigarettes, uncomplicated: Secondary | ICD-10-CM | POA: Diagnosis not present

## 2016-09-02 DIAGNOSIS — Z7984 Long term (current) use of oral hypoglycemic drugs: Secondary | ICD-10-CM | POA: Diagnosis not present

## 2016-09-02 DIAGNOSIS — Z5321 Procedure and treatment not carried out due to patient leaving prior to being seen by health care provider: Secondary | ICD-10-CM | POA: Insufficient documentation

## 2016-09-02 DIAGNOSIS — E119 Type 2 diabetes mellitus without complications: Secondary | ICD-10-CM | POA: Diagnosis not present

## 2016-09-02 DIAGNOSIS — Z794 Long term (current) use of insulin: Secondary | ICD-10-CM | POA: Insufficient documentation

## 2016-09-02 NOTE — ED Triage Notes (Signed)
Patient states released yesterday morning from ED after being treated for swelling in her R foot.   Patient states her bp was running in the 170s while she was here, but states her systolic pressure this morning was over 200.   Patient was told by MD to come in to have treated.

## 2016-09-02 NOTE — ED Notes (Signed)
Patient stated "My blood pressure is way lower, I am going home." Tech advised against going home, patient stated "I am going home since my blood pressure is better."

## 2016-09-02 NOTE — ED Notes (Signed)
Patient states had to go outside.  Patient was smoking under the Tobacco Free Sign.   Patient was told had to put out and assisted her back in.   Patient upset that we wouldn't push her off campus to smoke.

## 2016-09-21 ENCOUNTER — Encounter (HOSPITAL_COMMUNITY): Payer: Self-pay | Admitting: Emergency Medicine

## 2016-09-21 ENCOUNTER — Emergency Department (HOSPITAL_COMMUNITY): Payer: Medicare HMO

## 2016-09-21 ENCOUNTER — Emergency Department (HOSPITAL_COMMUNITY)
Admission: EM | Admit: 2016-09-21 | Discharge: 2016-09-21 | Disposition: A | Discharge status: Home / Self Care | Payer: Medicare HMO | Attending: Emergency Medicine | Admitting: Emergency Medicine

## 2016-09-21 DIAGNOSIS — I10 Essential (primary) hypertension: Secondary | ICD-10-CM | POA: Diagnosis not present

## 2016-09-21 DIAGNOSIS — Z7984 Long term (current) use of oral hypoglycemic drugs: Secondary | ICD-10-CM | POA: Insufficient documentation

## 2016-09-21 DIAGNOSIS — F17210 Nicotine dependence, cigarettes, uncomplicated: Secondary | ICD-10-CM | POA: Insufficient documentation

## 2016-09-21 DIAGNOSIS — Z794 Long term (current) use of insulin: Secondary | ICD-10-CM | POA: Insufficient documentation

## 2016-09-21 DIAGNOSIS — Z79899 Other long term (current) drug therapy: Secondary | ICD-10-CM | POA: Insufficient documentation

## 2016-09-21 DIAGNOSIS — R2241 Localized swelling, mass and lump, right lower limb: Secondary | ICD-10-CM | POA: Insufficient documentation

## 2016-09-21 DIAGNOSIS — E119 Type 2 diabetes mellitus without complications: Secondary | ICD-10-CM | POA: Insufficient documentation

## 2016-09-21 DIAGNOSIS — R072 Precordial pain: Secondary | ICD-10-CM | POA: Diagnosis not present

## 2016-09-21 DIAGNOSIS — R079 Chest pain, unspecified: Secondary | ICD-10-CM

## 2016-09-21 LAB — CBC WITH DIFFERENTIAL/PLATELET
BASOS PCT: 0 %
Basophils Absolute: 0 10*3/uL (ref 0.0–0.1)
EOS ABS: 0.1 10*3/uL (ref 0.0–0.7)
EOS PCT: 1 %
HCT: 43.4 % (ref 36.0–46.0)
Hemoglobin: 14.7 g/dL (ref 12.0–15.0)
LYMPHS ABS: 4.1 10*3/uL — AB (ref 0.7–4.0)
Lymphocytes Relative: 35 %
MCH: 32.1 pg (ref 26.0–34.0)
MCHC: 33.9 g/dL (ref 30.0–36.0)
MCV: 94.8 fL (ref 78.0–100.0)
Monocytes Absolute: 0.8 10*3/uL (ref 0.1–1.0)
Monocytes Relative: 7 %
Neutro Abs: 6.6 10*3/uL (ref 1.7–7.7)
Neutrophils Relative %: 57 %
PLATELETS: 199 10*3/uL (ref 150–400)
RBC: 4.58 MIL/uL (ref 3.87–5.11)
RDW: 15.3 % (ref 11.5–15.5)
WBC: 11.6 10*3/uL — AB (ref 4.0–10.5)

## 2016-09-21 LAB — BASIC METABOLIC PANEL
Anion gap: 11 (ref 5–15)
BUN: 25 mg/dL — AB (ref 6–20)
CO2: 29 mmol/L (ref 22–32)
CREATININE: 1.03 mg/dL — AB (ref 0.44–1.00)
Calcium: 10 mg/dL (ref 8.9–10.3)
Chloride: 98 mmol/L — ABNORMAL LOW (ref 101–111)
GFR calc Af Amer: >60 mL/min (ref >60)
GFR, EST NON AFRICAN AMERICAN: 59 mL/min — AB (ref >60)
Glucose, Bld: 126 mg/dL — ABNORMAL HIGH (ref 65–99)
POTASSIUM: 3.5 mmol/L (ref 3.5–5.1)
SODIUM: 138 mmol/L (ref 135–145)

## 2016-09-21 LAB — I-STAT TROPONIN, ED
TROPONIN I, POC: 0 ng/mL (ref 0.00–0.08)
TROPONIN I, POC: 0 ng/mL (ref 0.00–0.08)

## 2016-09-21 LAB — D-DIMER, QUANTITATIVE: D-Dimer, Quant: <0.27 ug/mL-FEU (ref 0.00–0.50)

## 2016-09-21 MED ORDER — MORPHINE SULFATE (PF) 4 MG/ML IV SOLN
4.0000 mg | Freq: Once | INTRAVENOUS | Status: AC
  Administered 2016-09-21: 4 mg via INTRAVENOUS
  Filled 2016-09-21: qty 1

## 2016-09-21 MED ORDER — ONDANSETRON HCL 4 MG/2ML IJ SOLN
4.0000 mg | Freq: Once | INTRAMUSCULAR | Status: AC
  Administered 2016-09-21: 4 mg via INTRAVENOUS
  Filled 2016-09-21: qty 2

## 2016-09-21 NOTE — ED Notes (Signed)
Pt requesting nausea medications, provider aware

## 2016-09-21 NOTE — ED Provider Notes (Signed)
[facility] DEPT Provider Note   CSN: [id number] Arrival date & time: 09/21/16  1758     History   Chief Complaint Chief Complaint  Patient presents with  . Chest Pain    HPI Monica Ellis is a 55 y.o. female.  The history is provided by the patient.  Chest Pain   This is a new problem. The current episode started 1 to 2 hours ago. The problem occurs constantly. The problem has been resolved. The pain is associated with rest. The pain is present in the substernal region. The pain is severe. The quality of the pain is described as pressure-like. The pain radiates to the right neck. Duration of episode(s) is 1 hour. Associated symptoms include diaphoresis, lower extremity edema and nausea. Pertinent negatives include no abdominal pain, no back pain, no cough, no fever, no headaches, no leg pain, no shortness of breath, no vomiting and no weakness. She has tried nitroglycerin (and 361m ASA) for the symptoms. The treatment provided significant relief. Risk factors include obesity and smoking/tobacco exposure.    Past Medical History:  Diagnosis Date  . Autoimmune hepatitis ([place]   . Diabetes mellitus without complication ([place]   . GERD (gastroesophageal reflux disease)   . Hypertension   . Lupus   . Shortness of breath dyspnea     Patient Active Problem List   Diagnosis Date Noted  . Abnormal LFTs   . Pain in the chest   . Diabetes mellitus with complication ([place]   . Hypoxia 12/29/2015  . Uncontrolled type 2 diabetes mellitus ([place] 12/29/2015  . Dyspnea 12/29/2015  . LLQ pain 12/29/2015  . Lupus 12/29/2015  . Chest pain 12/29/2015  . Transaminitis 12/29/2015  . Chronic pain 12/29/2015  . Depression 12/29/2015    Past Surgical History:  Procedure Laterality Date  . CHOLECYSTECTOMY    . DILATION AND CURETTAGE OF UTERUS      OB History    No data available       Home Medications    Prior to Admission medications   Medication Sig Start Date End  Date Taking? Authorizing Provider  albuterol (PROAIR HFA) 108 (90 Base) MCG/ACT inhaler Inhale 1 puff into the lungs every 6 (six) hours as needed for wheezing or shortness of breath.    Yes Historical Provider, MD  amLODipine (NORVASC) 10 MG tablet Take 10 mg by mouth at bedtime.    Yes Historical Provider, MD  budesonide (ENTOCORT EC) 3 MG 24 hr capsule Take 3 capsules by mouth daily. 08/26/16  Yes Historical Provider, MD  DULoxetine (CYMBALTA) 60 MG capsule Take 1 capsule by mouth daily. 08/26/16  Yes Historical Provider, MD  furosemide (LASIX) 40 MG tablet Take 40 mg by mouth daily. 09/09/16  Yes Historical Provider, MD  HYDROcodone-acetaminophen (NORCO/VICODIN) 5-325 MG tablet Take 1-2 tablets by mouth every 6 (six) hours as needed for moderate pain. 09/01/16  Yes BDomenic Moras PA-C  insulin aspart (NOVOLOG) 100 UNIT/ML injection Inject 20 Units into the skin 3 (three) times daily before meals.   Yes Historical Provider, MD  Insulin Aspart Prot & Aspart (NOVOLOG MIX 70/30 FLEXPEN [place]) Inject 60 Units into the skin 2 (two) times daily.   Yes Historical Provider, MD  LYRICA 200 MG capsule Take 1 capsule by mouth daily. 07/14/16  Yes Historical Provider, MD  magnesium 30 MG tablet Take 30 mg by mouth daily.   Yes Historical Provider, MD  mercaptopurine (PURINETHOL) 50 MG tablet Take 1 tablet by mouth 2 (two) times daily.  08/31/16  Yes Historical Provider, MD  metFORMIN (GLUCOPHAGE) 500 MG tablet Take 500 mg by mouth 2 (two) times daily with a meal.   Yes Historical Provider, MD  nadolol (CORGARD) 40 MG tablet Take 40 mg by mouth daily. 09/16/16  Yes Historical Provider, MD  Oxycodone HCl 10 MG TABS Take 10 mg by mouth every 6 (six) hours as needed (pain).   Yes Historical Provider, MD  potassium chloride SA (K-DUR,KLOR-CON) 20 MEQ tablet Take 20 mEq by mouth 2 (two) times daily.   Yes Historical Provider, MD  predniSONE (DELTASONE) 20 MG tablet Take 20 mg by mouth 2 (two) times daily with a meal.  07/20/16   Yes Historical Provider, MD  ranitidine (ZANTAC) 150 MG capsule Take 300 mg by mouth 2 (two) times daily.   Yes Historical Provider, MD  traZODone (DESYREL) 50 MG tablet Take 4 tablets by mouth daily as needed for sleep.  08/25/16  Yes Historical Provider, MD  Vitamin D, Ergocalciferol, (DRISDOL) 50000 units CAPS capsule Take 50,000 Units by mouth every 7 (seven) days.   Yes Historical Provider, MD  diphenoxylate-atropine (LOMOTIL) 2.5-0.025 MG tablet Take 1 tablet by mouth 4 (four) times daily as needed for diarrhea or loose stools. Patient not taking: Reported on 09/21/2016 02/01/16   Orpah Greek, MD  Insulin Glargine (LANTUS) 100 UNIT/ML Solostar Pen Inject 25 Units into the skin daily at 10 pm. Patient not taking: Reported on 09/21/2016 12/31/15   Velta Addison Mikhail, DO  insulin lispro (HUMALOG KWIKPEN) 100 UNIT/ML KiwkPen 6 units per meal Use as sliding scale: CBG < 70: implement hypoglycemia protocol CBG 70 - 120: 0 units, CBG 121 - 150: 2 units, CBG 151 - 200: 3 units, CBG 201 - 250: 5 units, CBG 251 - 300: 8 units, CBG 301 - 350: 11 units, CBG 351 - 400: 15 units Patient not taking: Reported on 09/21/2016 12/31/15   Velta Addison Mikhail, DO  metoCLOPramide (REGLAN) 10 MG tablet Take 1 tablet (10 mg total) by mouth every 6 (six) hours. Patient not taking: Reported on 09/21/2016 02/01/16   Orpah Greek, MD  nicotine (NICODERM CQ - DOSED IN MG/24 HOURS) 14 mg/24hr patch Place 1 patch (14 mg total) onto the skin daily. Patient not taking: Reported on 09/21/2016 12/31/15   Cristal Ford, DO    Family History Family History  Problem Relation Age of Onset  . Diabetes Father   . Hypertension Father     Social History Social History  Substance Use Topics  . Smoking status: Current Every Day Smoker    Packs/day: 1.00    Years: 30.00    Types: Cigarettes  . Smokeless tobacco: Never Used  . Alcohol use No     Allergies   Ace inhibitors; Lisinopril; Tramadol; Iodinated  diagnostic agents; Iodine; Janumet [sitagliptin-metformin hcl]; and Victoza [liraglutide]   Review of Systems Review of Systems  Constitutional: Positive for diaphoresis. Negative for fever.  HENT: Negative for congestion.   Respiratory: Negative for cough and shortness of breath.   Cardiovascular: Positive for chest pain and leg swelling.       Ongoing RLE swelling without pain  Gastrointestinal: Positive for nausea. Negative for abdominal pain and vomiting.  Genitourinary: Negative for flank pain.  Musculoskeletal: Negative for back pain and neck stiffness.  Skin: Negative for rash.  Neurological: Negative for weakness and headaches.  Psychiatric/Behavioral: Negative for confusion.     Physical Exam Updated Vital Signs BP (!) 96/52   Pulse 65   Temp 97.8  F (36.6 C) (Oral)   Resp 11   SpO2 91%   Physical Exam  Constitutional: She is oriented to person, place, and time. She appears well-developed and well-nourished. No distress.  Pleasant, cooperative, obese otherwise well-appearing  HENT:  Head: Normocephalic and atraumatic.  Eyes: Conjunctivae are normal. No scleral icterus.  Neck: Normal range of motion. Neck supple. No JVD present.  Cardiovascular: Normal rate, regular rhythm and intact distal pulses.  Exam reveals no gallop and no friction rub.   Pulmonary/Chest: Effort normal and breath sounds normal. No respiratory distress. She exhibits no tenderness.  Abdominal: Soft. She exhibits no distension. There is no tenderness.  Musculoskeletal: She exhibits edema. She exhibits no tenderness.  Trace RLE nonpitting edema. No calf swelling or tenderness. Warm and perfused b/l LE's  Neurological: She is alert and oriented to person, place, and time. She exhibits normal muscle tone. Coordination normal.  Skin: Skin is warm and dry. Capillary refill takes less than 2 seconds. No rash noted. She is not diaphoretic.  Psychiatric: She has a normal mood and affect.  Nursing note  and vitals reviewed.    ED Treatments / Results  Labs (all labs ordered are listed, but only abnormal results are displayed) Labs Reviewed  CBC WITH DIFFERENTIAL/PLATELET - Abnormal; Notable for the following:       Result Value   WBC 11.6 (*)    Lymphs Abs 4.1 (*)    All other components within normal limits  BASIC METABOLIC PANEL - Abnormal; Notable for the following:    Chloride 98 (*)    Glucose, Bld 126 (*)    BUN 25 (*)    Creatinine, Ser 1.03 (*)    GFR calc non Af Amer 59 (*)    All other components within normal limits  D-DIMER, QUANTITATIVE (NOT AT [facility])  I-STAT TROPOININ, ED  I-STAT TROPOININ, ED    EKG  EKG Interpretation  Date/Time:  Tuesday September 21 2016 18:06:52 EDT Ventricular Rate:  77 PR Interval:    QRS Duration: 79 QT Interval:  378 QTC Calculation: 428 R Axis:   37 Text Interpretation:  Sinus rhythm Probable left atrial enlargement Nonspecific T abnormalities, lateral leads No significant change since last tracing Confirmed by [facility] MD, Corene Cornea [phone]) on 09/21/2016 8:48:37 PM       Radiology Dg Chest 2 View  Result Date: 09/21/2016 CLINICAL DATA:  Generalized chest pain that radiates to the right side of the neck. Shortness of breath. EXAM: CHEST  2 VIEW COMPARISON:  12/29/2015 FINDINGS: Few linear densities in the left lower chest are suggestive for scarring or atelectasis. Otherwise, the lungs are clear. Heart size is upper limits of normal but stable. No pulmonary edema. The trachea is midline. Atherosclerotic calcifications at the aortic arch. No pleural effusions. IMPRESSION: No acute chest abnormality. Electronically Signed   By: Markus Daft M.D.   On: 09/21/2016 19:44    Procedures Procedures (including critical care time)  Medications Ordered in ED Medications  morphine 4 MG/ML injection 4 mg (4 mg Intravenous Given 09/21/16 1826)  ondansetron (ZOFRAN) injection 4 mg (4 mg Intravenous Given 09/21/16 2002)     Initial Impression /  Assessment and Plan / ED Course  I have reviewed the triage vital signs and the nursing notes.  Pertinent labs & imaging results that were available during my care of the patient were reviewed by me and considered in my medical decision making (see chart for details).  Clinical Course   Cullen Vanallen is  a 55 y.o. female with SLE, HTN, who presents to ED for assessment of rapid-onset substernal chest pressure radiating to right sided neck associated with diaphoresis and nausea while sitting on porch today. Resolved after aspirin and NTG given by EMS, no reoccurrence. No back or abdominal pain, no persistence of pain, doubt dissection. Risks of PE but negative d-dimer, doubt PE as no dyspnea. Noted to have RLE swelling without tenderness, no calf swelling or tenderness. HEAR score 4. Negative delta troponins. No PTX, no PNA, normal mediastinum on CXR. Spoke to pt about unclear etiology of chest pain this evening, but may have cardiac disease though no heart attack tonight. Likely esophageal spasm, however, recommend f/u with PCP this week to schedule an outpatient stress test. Advised to return to ER for any new, worse, recurrent, or concerning symptoms. Pt demonstrates understanding of this and comfort with d/c home.  Pt condition, course, and discharge were discussed with attending physician Dr. Merrily Pew.   Final Clinical Impressions(s) / ED Diagnoses   Final diagnoses:  Chest pain    New Prescriptions New Prescriptions   No medications on file     Paralee Cancel, MD 09/21/16 2357    Merrily Pew, MD 09/23/16 [phone]

## 2016-09-21 NOTE — ED Notes (Signed)
Lab to add on d-dimer 

## 2016-09-21 NOTE — ED Notes (Signed)
Attempted labs without success

## 2016-09-21 NOTE — ED Triage Notes (Signed)
Per [facility] patient from home for chest pressure and sharp pain.  Patient states sudden onset of pressure around 1600 today that progressed into sharp pain radiating into her right neck.  Patient received 1SL nitro en route which she states relieved the pain from 7/10 to 6/10.  Also received 317m aspirin and 410mzofran prior to arrival.  Patient has 20g IV in left AC saline locked.  Patient in no apparent distress at this time.

## 2016-09-22 ENCOUNTER — Ambulatory Visit: Payer: Medicare HMO | Admitting: Podiatry

## 2016-10-13 ENCOUNTER — Ambulatory Visit (HOSPITAL_COMMUNITY)
Admission: EM | Admit: 2016-10-13 | Discharge: 2016-10-13 | Disposition: A | Discharge status: Home / Self Care | Payer: Medicare HMO | Attending: Family Medicine | Admitting: Family Medicine

## 2016-10-13 ENCOUNTER — Encounter (HOSPITAL_COMMUNITY): Payer: Self-pay | Admitting: Family Medicine

## 2016-10-13 DIAGNOSIS — J0100 Acute maxillary sinusitis, unspecified: Secondary | ICD-10-CM

## 2016-10-13 MED ORDER — AMOXICILLIN 875 MG PO TABS: 875.0000 mg | ORAL_TABLET | Freq: Two times a day (BID) | ORAL | 0 refills | Status: DC

## 2016-10-13 MED ORDER — FLUCONAZOLE 150 MG PO TABS: 150.0000 mg | ORAL_TABLET | Freq: Once | ORAL | 0 refills | Status: AC

## 2016-10-13 NOTE — ED Provider Notes (Signed)
[place]    CSN: [id number] Arrival date & time: 10/13/16  1513     History   Chief Complaint Chief Complaint  Patient presents with  . Facial Pain    HPI Monica Ellis is a 55 y.o. female.   This is a 55 year old woman that presents with facial pain and congestion. She's been so sick for 2 weeks with right-sided facial pain, low-grade fever, and congestion.  Patient smokes.      Past Medical History:  Diagnosis Date  . Autoimmune hepatitis ([place])   . Diabetes mellitus without complication ([place])   . GERD (gastroesophageal reflux disease)   . Hypertension   . Lupus   . Shortness of breath dyspnea     Patient Active Problem List   Diagnosis Date Noted  . Acute maxillary sinusitis 10/13/2016  . Abnormal LFTs   . Pain in the chest   . Diabetes mellitus with complication ([place])   . Hypoxia 12/29/2015  . Uncontrolled type 2 diabetes mellitus ([place]) 12/29/2015  . Dyspnea 12/29/2015  . LLQ pain 12/29/2015  . Lupus 12/29/2015  . Chest pain 12/29/2015  . Transaminitis 12/29/2015  . Chronic pain 12/29/2015  . Depression 12/29/2015    Past Surgical History:  Procedure Laterality Date  . CHOLECYSTECTOMY    . DILATION AND CURETTAGE OF UTERUS      OB History    No data available       Home Medications    Prior to Admission medications   Medication Sig Start Date End Date Taking? Authorizing Provider  albuterol (PROAIR HFA) 108 (90 Base) MCG/ACT inhaler Inhale 1 puff into the lungs every 6 (six) hours as needed for wheezing or shortness of breath.     Historical Provider, MD  amLODipine (NORVASC) 10 MG tablet Take 10 mg by mouth at bedtime.     Historical Provider, MD  amoxicillin (AMOXIL) 875 MG tablet Take 1 tablet (875 mg total) by mouth 2 (two) times daily. 10/13/16   Robyn Haber, MD  budesonide (ENTOCORT EC) 3 MG 24 hr capsule Take 3 capsules by mouth daily. 08/26/16   Historical Provider, MD  DULoxetine (CYMBALTA) 60 MG capsule Take  1 capsule by mouth daily. 08/26/16   Historical Provider, MD  fluconazole (DIFLUCAN) 150 MG tablet Take 1 tablet (150 mg total) by mouth once. Repeat if needed 10/13/16 10/13/16  Robyn Haber, MD  furosemide (LASIX) 40 MG tablet Take 40 mg by mouth daily. 09/09/16   Historical Provider, MD  HYDROcodone-acetaminophen (NORCO/VICODIN) 5-325 MG tablet Take 1-2 tablets by mouth every 6 (six) hours as needed for moderate pain. 09/01/16   [name], PA-C  insulin aspart (NOVOLOG) 100 UNIT/ML injection Inject 20 Units into the skin 3 (three) times daily before meals.    Historical Provider, MD  Insulin Aspart Prot & Aspart (NOVOLOG MIX 70/30 FLEXPEN [place]) Inject 60 Units into the skin 2 (two) times daily.    Historical Provider, MD  LYRICA 200 MG capsule Take 1 capsule by mouth daily. 07/14/16   Historical Provider, MD  magnesium 30 MG tablet Take 30 mg by mouth daily.    Historical Provider, MD  mercaptopurine (PURINETHOL) 50 MG tablet Take 1 tablet by mouth 2 (two) times daily. 08/31/16   Historical Provider, MD  metFORMIN (GLUCOPHAGE) 500 MG tablet Take 500 mg by mouth 2 (two) times daily with a meal.    Historical Provider, MD  nadolol (CORGARD) 40 MG tablet Take 40 mg by mouth daily. 09/16/16   Historical  Provider, MD  Oxycodone HCl 10 MG TABS Take 10 mg by mouth every 6 (six) hours as needed (pain).    Historical Provider, MD  potassium chloride SA (K-DUR,KLOR-CON) 20 MEQ tablet Take 20 mEq by mouth 2 (two) times daily.    Historical Provider, MD  predniSONE (DELTASONE) 20 MG tablet Take 20 mg by mouth 2 (two) times daily with a meal.  07/20/16   Historical Provider, MD  ranitidine (ZANTAC) 150 MG capsule Take 300 mg by mouth 2 (two) times daily.    Historical Provider, MD  traZODone (DESYREL) 50 MG tablet Take 4 tablets by mouth daily as needed for sleep.  08/25/16   Historical Provider, MD  Vitamin D, Ergocalciferol, (DRISDOL) 50000 units CAPS capsule Take 50,000 Units by mouth every 7 (seven) days.     Historical Provider, MD    Family History Family History  Problem Relation Age of Onset  . Diabetes Father   . Hypertension Father     Social History Social History  Substance Use Topics  . Smoking status: Current Every Day Smoker    Packs/day: 1.00    Years: 30.00    Types: Cigarettes  . Smokeless tobacco: Never Used  . Alcohol use No     Allergies   Ace inhibitors; Lisinopril; Tramadol; Iodinated diagnostic agents; Iodine; Janumet [sitagliptin-metformin hcl]; and Victoza [liraglutide]   Review of Systems Review of Systems  Constitutional: Positive for fatigue and fever.  HENT: Positive for congestion and sinus pressure.   Eyes: Negative.   Respiratory: Negative.   Cardiovascular: Negative.   Gastrointestinal: Negative.      Physical Exam Triage Vital Signs ED Triage Vitals  Enc Vitals Group     BP 10/13/16 1536 141/80     Pulse Rate 10/13/16 1536 73     Resp 10/13/16 1536 20     Temp 10/13/16 1536 98.9 F (37.2 C)     Temp Source 10/13/16 1536 Oral     SpO2 10/13/16 1536 95 %     Weight 10/13/16 1536 290 lb (131.5 kg)     Height 10/13/16 1536 5' 5.5" (1.664 m)     Head Circumference --      Peak Flow --      Pain Score 10/13/16 1544 8     Pain Loc --      Pain Edu? --      Excl. in [place]? --    No data found.   Updated Vital Signs BP 141/80 (BP Location: Left Arm)   Pulse 73   Temp 98.9 F (37.2 C) (Oral)   Resp 20   Ht 5' 5.5" (1.664 m)   Wt 290 lb (131.5 kg)   SpO2 95%   BMI 47.52 kg/m     Physical Exam  Constitutional: She is oriented to person, place, and time. She appears well-developed and well-nourished.  HENT:  Head: Normocephalic.  Right Ear: External ear normal.  Left Ear: External ear normal.  Mouth/Throat: Oropharynx is clear and moist.  Markedly swollen nasal passage on the right  Eyes: Conjunctivae and EOM are normal. Pupils are equal, round, and reactive to light.  Neck: Normal range of motion. Neck supple.    Pulmonary/Chest: Effort normal.  Musculoskeletal: Normal range of motion.  Neurological: She is alert and oriented to person, place, and time.  Skin: Skin is warm and dry.  Nursing note and vitals reviewed.    UC Treatments / Results  Labs (all labs ordered are listed, but only abnormal results  are displayed) Labs Reviewed - No data to display  EKG  EKG Interpretation None       Radiology No results found.  Procedures Procedures (including critical care time)  Medications Ordered in UC Medications - No data to display   Initial Impression / Assessment and Plan / UC Course  I have reviewed the triage vital signs and the nursing notes.  Pertinent labs & imaging results that were available during my care of the patient were reviewed by me and considered in my medical decision making (see chart for details).  Clinical Course     Final Clinical Impressions(s) / UC Diagnoses   Final diagnoses:  Acute maxillary sinusitis, recurrence not specified    New Prescriptions New Prescriptions   AMOXICILLIN (AMOXIL) 875 MG TABLET    Take 1 tablet (875 mg total) by mouth 2 (two) times daily.   FLUCONAZOLE (DIFLUCAN) 150 MG TABLET    Take 1 tablet (150 mg total) by mouth once. Repeat if needed     Robyn Haber, MD 10/13/16 1557

## 2016-10-13 NOTE — ED Triage Notes (Signed)
Pt here for facial pain and nasal drainage x 2 weeks.

## 2016-10-14 ENCOUNTER — Telehealth: Payer: Self-pay

## 2016-11-16 NOTE — Telephone Encounter (Signed)
Patient was contacted with Darcella Cheshire, PharmD candidate. Unable to reach patient

## 2017-02-11 ENCOUNTER — Emergency Department (HOSPITAL_COMMUNITY): Payer: Medicare HMO

## 2017-02-11 ENCOUNTER — Encounter (HOSPITAL_COMMUNITY): Payer: Self-pay

## 2017-02-11 ENCOUNTER — Emergency Department (HOSPITAL_COMMUNITY)
Admission: EM | Admit: 2017-02-11 | Discharge: 2017-02-11 | Disposition: A | Discharge status: Home / Self Care | Payer: Medicare HMO | Attending: Emergency Medicine | Admitting: Emergency Medicine

## 2017-02-11 DIAGNOSIS — E1169 Type 2 diabetes mellitus with other specified complication: Secondary | ICD-10-CM | POA: Diagnosis not present

## 2017-02-11 DIAGNOSIS — E860 Dehydration: Secondary | ICD-10-CM | POA: Insufficient documentation

## 2017-02-11 DIAGNOSIS — R531 Weakness: Secondary | ICD-10-CM | POA: Diagnosis present

## 2017-02-11 DIAGNOSIS — E8889 Other specified metabolic disorders: Secondary | ICD-10-CM | POA: Diagnosis not present

## 2017-02-11 DIAGNOSIS — Z794 Long term (current) use of insulin: Secondary | ICD-10-CM | POA: Diagnosis not present

## 2017-02-11 DIAGNOSIS — F17210 Nicotine dependence, cigarettes, uncomplicated: Secondary | ICD-10-CM | POA: Insufficient documentation

## 2017-02-11 DIAGNOSIS — R0602 Shortness of breath: Secondary | ICD-10-CM | POA: Diagnosis not present

## 2017-02-11 DIAGNOSIS — Z79899 Other long term (current) drug therapy: Secondary | ICD-10-CM | POA: Insufficient documentation

## 2017-02-11 DIAGNOSIS — E1165 Type 2 diabetes mellitus with hyperglycemia: Secondary | ICD-10-CM | POA: Insufficient documentation

## 2017-02-11 DIAGNOSIS — R739 Hyperglycemia, unspecified: Secondary | ICD-10-CM

## 2017-02-11 DIAGNOSIS — I10 Essential (primary) hypertension: Secondary | ICD-10-CM | POA: Insufficient documentation

## 2017-02-11 LAB — BASIC METABOLIC PANEL
ANION GAP: 8 (ref 5–15)
BUN: 12 mg/dL (ref 6–20)
CO2: 24 mmol/L (ref 22–32)
Calcium: 8.8 mg/dL — ABNORMAL LOW (ref 8.9–10.3)
Chloride: 105 mmol/L (ref 101–111)
Creatinine, Ser: 0.86 mg/dL (ref 0.44–1.00)
GFR calc Af Amer: >60 mL/min (ref >60)
GFR calc non Af Amer: >60 mL/min (ref >60)
GLUCOSE: 235 mg/dL — AB (ref 65–99)
POTASSIUM: 3.6 mmol/L (ref 3.5–5.1)
Sodium: 137 mmol/L (ref 135–145)

## 2017-02-11 LAB — I-STAT CG4 LACTIC ACID, ED: Lactic Acid, Venous: 2.86 mmol/L (ref 0.5–1.9)

## 2017-02-11 LAB — CBC
HEMATOCRIT: 45 % (ref 36.0–46.0)
HEMOGLOBIN: 15.5 g/dL — AB (ref 12.0–15.0)
MCH: 33.3 pg (ref 26.0–34.0)
MCHC: 34.4 g/dL (ref 30.0–36.0)
MCV: 96.6 fL (ref 78.0–100.0)
Platelets: 276 10*3/uL (ref 150–400)
RBC: 4.66 MIL/uL (ref 3.87–5.11)
RDW: 14.8 % (ref 11.5–15.5)
WBC: 10.7 10*3/uL — ABNORMAL HIGH (ref 4.0–10.5)

## 2017-02-11 LAB — BRAIN NATRIURETIC PEPTIDE: B Natriuretic Peptide: 23.8 pg/mL (ref 0.0–100.0)

## 2017-02-11 LAB — TROPONIN I

## 2017-02-11 MED ORDER — FLUTICASONE PROPIONATE 50 MCG/ACT NA SUSP: 1.0000 | Freq: Every day | NASAL | 0 refills | Status: DC

## 2017-02-11 MED ORDER — SODIUM CHLORIDE 0.9 % IV BOLUS (SEPSIS)
1000.0000 mL | Freq: Once | INTRAVENOUS | Status: AC
  Administered 2017-02-11: 1000 mL via INTRAVENOUS

## 2017-02-11 NOTE — ED Provider Notes (Signed)
[facility] DEPT Provider Note   CSN: [id number] Arrival date & time: 02/11/17  1350     History   Chief Complaint Chief Complaint  Patient presents with  . Shortness of Breath  . Weakness    HPI Monica Ellis is a 56 y.o. female.  The history is provided by the patient and medical records.  Shortness of Breath  This is a new problem. The average episode lasts 1 month. The problem occurs continuously.The problem has not changed since onset.Associated symptoms include a fever, neck pain and sputum production. Pertinent negatives include no rhinorrhea, no sore throat, no swollen glands, no cough, no hemoptysis, no wheezing, no PND, no chest pain, no vomiting, no abdominal pain, no rash, no leg swelling and no claudication. Associated medical issues include pneumonia. Associated medical issues do not include asthma, COPD, chronic lung disease, PE, CAD, heart failure, past MI, DVT or recent surgery.    Past Medical History:  Diagnosis Date  . Autoimmune hepatitis ([place])   . Diabetes mellitus without complication ([place])   . GERD (gastroesophageal reflux disease)   . Hypertension   . Lupus   . Shortness of breath dyspnea     Patient Active Problem List   Diagnosis Date Noted  . Acute maxillary sinusitis 10/13/2016  . Abnormal LFTs   . Pain in the chest   . Diabetes mellitus with complication ([place])   . Hypoxia 12/29/2015  . Uncontrolled type 2 diabetes mellitus ([place]) 12/29/2015  . Dyspnea 12/29/2015  . LLQ pain 12/29/2015  . Lupus 12/29/2015  . Chest pain 12/29/2015  . Transaminitis 12/29/2015  . Chronic pain 12/29/2015  . Depression 12/29/2015    Past Surgical History:  Procedure Laterality Date  . CHOLECYSTECTOMY    . DILATION AND CURETTAGE OF UTERUS      OB History    No data available       Home Medications    Prior to Admission medications   Medication Sig Start Date End Date Taking? Authorizing Provider  albuterol (PROAIR HFA) 108 (90 Base)  MCG/ACT inhaler Inhale 1 puff into the lungs every 6 (six) hours as needed for wheezing or shortness of breath.    Yes Historical Provider, MD  amLODipine (NORVASC) 10 MG tablet Take 10 mg by mouth at bedtime.    Yes Historical Provider, MD  DULoxetine (CYMBALTA) 60 MG capsule Take 1 capsule by mouth at bedtime.  08/26/16  Yes Historical Provider, MD  insulin aspart (NOVOLOG) 100 UNIT/ML injection Inject 20 Units into the skin 3 (three) times daily before meals.   Yes Historical Provider, MD  Insulin Aspart Prot & Aspart (NOVOLOG MIX 70/30 FLEXPEN [place]) Inject 60 Units into the skin 2 (two) times daily.   Yes Historical Provider, MD  magnesium 30 MG tablet Take 30 mg by mouth daily.   Yes Historical Provider, MD  nadolol (CORGARD) 40 MG tablet Take 40 mg by mouth daily. 09/16/16  Yes Historical Provider, MD  Oxycodone HCl 10 MG TABS Take 10 mg by mouth every 6 (six) hours as needed (pain).   Yes Historical Provider, MD  potassium chloride SA (K-DUR,KLOR-CON) 20 MEQ tablet Take 20 mEq by mouth 2 (two) times daily.   Yes Historical Provider, MD  predniSONE (DELTASONE) 20 MG tablet Take 20 mg by mouth 2 (two) times daily with a meal.  07/20/16  Yes Historical Provider, MD  ranitidine (ZANTAC) 150 MG capsule Take 300 mg by mouth 2 (two) times daily.   Yes Historical Provider, MD  Vitamin D, Ergocalciferol, (DRISDOL) 50000 units CAPS capsule Take 50,000 Units by mouth every 7 (seven) days. Fridays   Yes Historical Provider, MD  amoxicillin (AMOXIL) 875 MG tablet Take 1 tablet (875 mg total) by mouth 2 (two) times daily. Patient not taking: Reported on 02/11/2017 10/13/16   Robyn Haber, MD  fluticasone [facility]) 50 MCG/ACT nasal spray Place 1 spray into both nostrils daily. 02/11/17   Monico Blitz, MD  HYDROcodone-acetaminophen (NORCO/VICODIN) 5-325 MG tablet Take 1-2 tablets by mouth every 6 (six) hours as needed for moderate pain. Patient not taking: Reported on 02/11/2017 09/01/16   [name],  PA-C    Family History Family History  Problem Relation Age of Onset  . Diabetes Father   . Hypertension Father     Social History Social History  Substance Use Topics  . Smoking status: Current Every Day Smoker    Packs/day: 1.00    Years: 30.00    Types: Cigarettes  . Smokeless tobacco: Never Used  . Alcohol use No     Allergies   Ace inhibitors; Lisinopril; Tramadol; Iodinated diagnostic agents; Iodine; Janumet [sitagliptin-metformin hcl]; and Victoza [liraglutide]   Review of Systems Review of Systems  Constitutional: Positive for diaphoresis, fatigue and fever.  HENT: Positive for congestion. Negative for rhinorrhea and sore throat.   Respiratory: Positive for sputum production and shortness of breath. Negative for cough, hemoptysis, chest tightness and wheezing.   Cardiovascular: Negative for chest pain, palpitations, claudication, leg swelling and PND.  Gastrointestinal: Positive for nausea. Negative for abdominal pain, constipation, diarrhea and vomiting.  Genitourinary: Positive for flank pain. Negative for dysuria, frequency and urgency.  Musculoskeletal: Positive for neck pain. Negative for myalgias and neck stiffness.  Skin: Negative for rash and wound.  Neurological: Positive for light-headedness. Negative for dizziness, syncope and facial asymmetry.  All other systems reviewed and are negative.    Physical Exam Updated Vital Signs BP 141/86   Pulse 76   Temp 98.7 F (37.1 C) (Oral)   Resp 18   SpO2 94%   Physical Exam  Constitutional: She is oriented to person, place, and time. She appears well-developed and well-nourished. No distress.  Morbidly obese  HENT:  Head: Normocephalic and atraumatic.  Mouth/Throat: Oropharynx is clear and moist.  Eyes: Conjunctivae and EOM are normal.  Neck: Normal range of motion. Neck supple.  Cardiovascular: Normal rate and regular rhythm.   Pulmonary/Chest: Effort normal and breath sounds normal. No respiratory  distress.  Abdominal: Soft. There is no tenderness.  Musculoskeletal: Normal range of motion. She exhibits no edema.  Neurological: She is alert and oriented to person, place, and time.  Skin: Skin is warm and dry. Capillary refill takes less than 2 seconds. She is not diaphoretic.  Psychiatric: She has a normal mood and affect.  Nursing note and vitals reviewed.    ED Treatments / Results  Labs (all labs ordered are listed, but only abnormal results are displayed) Labs Reviewed  BASIC METABOLIC PANEL - Abnormal; Notable for the following:       Result Value   Glucose, Bld 235 (*)    Calcium 8.8 (*)    All other components within normal limits  CBC - Abnormal; Notable for the following:    WBC 10.7 (*)    Hemoglobin 15.5 (*)    All other components within normal limits  I-STAT CG4 LACTIC ACID, ED - Abnormal; Notable for the following:    Lactic Acid, Venous 2.86 (*)    All other  components within normal limits  BRAIN NATRIURETIC PEPTIDE  TROPONIN I  URINALYSIS, ROUTINE W REFLEX MICROSCOPIC  CBG MONITORING, ED    EKG  EKG Interpretation  Date/Time:  Friday February 11 2017 13:57:46 EST Ventricular Rate:  84 PR Interval:  144 QRS Duration: 80 QT Interval:  378 QTC Calculation: 446 R Axis:   113 Text Interpretation:  Normal sinus rhythm Right axis deviation Abnormal ECG No significant change since last tracing Confirmed by KNOTT MD, DANIEL [phone]) on 02/11/2017 4:04:20 PM       Radiology Dg Chest 2 View  Result Date: 02/11/2017 CLINICAL DATA:  Ongoing x 4 weeks; SOB, weakness, chills, and right sided neck pain that radiates down her back. Pt is extremely diaphoretic at triage. Pt just finished an antibiotic 2 days ago for pna. Hx of Lupus, DM-type 2 EXAM: CHEST  2 VIEW COMPARISON:  09/21/2016 FINDINGS: Heart size is normal. Low lung volumes. There is minimal subsegmental atelectasis at both lung bases. No focal consolidations or pleural effusions. No pulmonary edema. Clips  are identified in the upper abdomen IMPRESSION: Bibasilar subsegmental atelectasis. Electronically Signed   By: Nolon Nations M.D.   On: 02/11/2017 15:50    Procedures Procedures (including critical care time)  Medications Ordered in ED Medications  sodium chloride 0.9 % bolus 1,000 mL (1,000 mLs Intravenous New Bag/Given 02/11/17 1530)     Initial Impression / Assessment and Plan / ED Course  I have reviewed the triage vital signs and the nursing notes.  Pertinent labs & imaging results that were available during my care of the patient were reviewed by me and considered in my medical decision making (see chart for details).     56 y.o. female PMH as above presents for one month fever, dyspnea, malaise, nausea, hypoxia. Pt reports she was diagnosed with "double PNA" by PCP one month ago. Rx Doxycycline, Cefdinir and prednisone burst on 2/9. She bought herself a home pulse ox to monitor oxygen levels and reports 90-91%. She returned to PCP on 3/1 because she did not feel any better. She was prescribed 5 days Levaquin on 3/1. Reports daily fever >101 for past month. Right flank pain with twisting only. Right neck pain x 2 days with lateral movement. Denies V/D, urinary symptoms, HA.   On exam, AF, VSS, NAD, sating well on RA.  - EKG NSR, HR 84, no ischemic changes or right heart strain - WBC 10.7, CBC otherwise unremarkable. BMP unremarkable. - lactic acid 2.86. 1L NS given. VSS, doubt sepsis, lactic acidosis likely 2/2 mild dehydration.  - CXR unremarkable, no bibasilar atelectasis, no focal consolidation.  - BNP, Troponin wnl.  Doubt cardiac or pulmonary etiology. Work-up c/w mild dehydration and hyperglycemia w/o ketosis. Advise increase PO hydration and follow-up with PCP for further care. Pt voiced understanding and agreement with plan.   Discussed with my attending physician, Dr Laneta Simmers.    Final Clinical Impressions(s) / ED Diagnoses   Final diagnoses:  Dehydration    Hyperglycemia without ketosis    New Prescriptions New Prescriptions   FLUTICASONE (FLONASE) 50 MCG/ACT NASAL SPRAY    Place 1 spray into both nostrils daily.     Monico Blitz, MD 02/11/17 [id number]    Leo Grosser, MD 02/12/17 [phone]

## 2017-02-11 NOTE — ED Triage Notes (Signed)
Pt reports SOB, weakness, chills, and right sided neck pain that radiates down her back. Pt is extremely diaphoretic at triage. She is currently taking abx for "double pneumonia." Hx of Lupus

## 2017-04-22 ENCOUNTER — Encounter: Payer: Medicare HMO | Admitting: Internal Medicine

## 2017-06-05 ENCOUNTER — Emergency Department (HOSPITAL_COMMUNITY): Payer: Medicare HMO

## 2017-06-05 ENCOUNTER — Emergency Department (HOSPITAL_COMMUNITY)
Admission: EM | Admit: 2017-06-05 | Discharge: 2017-06-05 | Disposition: A | Discharge status: Home / Self Care | Payer: Medicare HMO | Source: Home / Self Care | Attending: Emergency Medicine | Admitting: Emergency Medicine

## 2017-06-05 ENCOUNTER — Encounter (HOSPITAL_COMMUNITY): Payer: Self-pay | Admitting: *Deleted

## 2017-06-05 DIAGNOSIS — K5792 Diverticulitis of intestine, part unspecified, without perforation or abscess without bleeding: Secondary | ICD-10-CM | POA: Insufficient documentation

## 2017-06-05 DIAGNOSIS — I10 Essential (primary) hypertension: Secondary | ICD-10-CM

## 2017-06-05 DIAGNOSIS — F17210 Nicotine dependence, cigarettes, uncomplicated: Secondary | ICD-10-CM | POA: Insufficient documentation

## 2017-06-05 DIAGNOSIS — E119 Type 2 diabetes mellitus without complications: Secondary | ICD-10-CM | POA: Insufficient documentation

## 2017-06-05 DIAGNOSIS — Z794 Long term (current) use of insulin: Secondary | ICD-10-CM | POA: Insufficient documentation

## 2017-06-05 DIAGNOSIS — K754 Autoimmune hepatitis: Secondary | ICD-10-CM | POA: Insufficient documentation

## 2017-06-05 LAB — COMPREHENSIVE METABOLIC PANEL
ALK PHOS: 63 U/L (ref 38–126)
ALT: 339 U/L — ABNORMAL HIGH (ref 14–54)
ANION GAP: 8 (ref 5–15)
AST: UNDETERMINED U/L (ref 15–41)
Albumin: 2.9 g/dL — ABNORMAL LOW (ref 3.5–5.0)
BUN: 14 mg/dL (ref 6–20)
CALCIUM: 8.6 mg/dL — AB (ref 8.9–10.3)
CHLORIDE: 103 mmol/L (ref 101–111)
CO2: 19 mmol/L — AB (ref 22–32)
CREATININE: 0.97 mg/dL (ref 0.44–1.00)
Glucose, Bld: 151 mg/dL — ABNORMAL HIGH (ref 65–99)
Potassium: 3.8 mmol/L (ref 3.5–5.1)
SODIUM: 130 mmol/L — AB (ref 135–145)
Total Bilirubin: 1.3 mg/dL — ABNORMAL HIGH (ref 0.3–1.2)
Total Protein: 12 g/dL — ABNORMAL HIGH (ref 6.5–8.1)

## 2017-06-05 LAB — URINALYSIS, ROUTINE W REFLEX MICROSCOPIC
Bilirubin Urine: NEGATIVE
GLUCOSE, UA: NEGATIVE mg/dL
KETONES UR: NEGATIVE mg/dL
Leukocytes, UA: NEGATIVE
Nitrite: NEGATIVE
Specific Gravity, Urine: 1.022 (ref 1.005–1.030)
pH: 5 (ref 5.0–8.0)

## 2017-06-05 LAB — LIPASE, BLOOD: LIPASE: 25 U/L (ref 11–51)

## 2017-06-05 LAB — CBC
HCT: 39.2 % (ref 36.0–46.0)
HEMOGLOBIN: 13.8 g/dL (ref 12.0–15.0)
MCH: 32.4 pg (ref 26.0–34.0)
MCHC: 35.2 g/dL (ref 30.0–36.0)
MCV: 92 fL (ref 78.0–100.0)
Platelets: 222 10*3/uL (ref 150–400)
RBC: 4.26 MIL/uL (ref 3.87–5.11)
RDW: 14.8 % (ref 11.5–15.5)
WBC: 10.8 10*3/uL — ABNORMAL HIGH (ref 4.0–10.5)

## 2017-06-05 LAB — CBG MONITORING, ED: GLUCOSE-CAPILLARY: 168 mg/dL — AB (ref 65–99)

## 2017-06-05 MED ORDER — METRONIDAZOLE 500 MG PO TABS: 500.0000 mg | ORAL_TABLET | Freq: Two times a day (BID) | ORAL | 0 refills | Status: DC

## 2017-06-05 MED ORDER — SODIUM CHLORIDE 0.9 % IV BOLUS (SEPSIS)
1000.0000 mL | Freq: Once | INTRAVENOUS | Status: AC
  Administered 2017-06-05: 1000 mL via INTRAVENOUS

## 2017-06-05 MED ORDER — SODIUM CHLORIDE 0.9 % IV SOLN
INTRAVENOUS | Status: DC
  Administered 2017-06-05: 15:00:00 via INTRAVENOUS

## 2017-06-05 MED ORDER — IOPAMIDOL (ISOVUE-370) INJECTION 76%
INTRAVENOUS | Status: AC
  Filled 2017-06-05: qty 100

## 2017-06-05 MED ORDER — DIPHENHYDRAMINE HCL 25 MG PO CAPS
50.0000 mg | ORAL_CAPSULE | Freq: Once | ORAL | Status: AC
  Administered 2017-06-05: 50 mg via ORAL
  Filled 2017-06-05: qty 2

## 2017-06-05 MED ORDER — ONDANSETRON HCL 4 MG/2ML IJ SOLN
4.0000 mg | Freq: Once | INTRAMUSCULAR | Status: AC
  Administered 2017-06-05: 4 mg via INTRAVENOUS
  Filled 2017-06-05: qty 2

## 2017-06-05 MED ORDER — DIPHENHYDRAMINE HCL 50 MG/ML IJ SOLN: 50.0000 mg | Freq: Once | INTRAMUSCULAR | Status: DC

## 2017-06-05 MED ORDER — HYDROCODONE-ACETAMINOPHEN 5-325 MG PO TABS: 1.0000 | ORAL_TABLET | ORAL | 0 refills | Status: DC | PRN

## 2017-06-05 MED ORDER — HYDROMORPHONE HCL 1 MG/ML IJ SOLN
0.5000 mg | INTRAMUSCULAR | Status: DC | PRN
  Administered 2017-06-05 (×2): 0.5 mg via INTRAVENOUS
  Filled 2017-06-05 (×2): qty 0.5

## 2017-06-05 MED ORDER — HYDROCORTISONE NA SUCCINATE PF 250 MG IJ SOLR
200.0000 mg | Freq: Once | INTRAMUSCULAR | Status: AC
  Administered 2017-06-05: 200 mg via INTRAVENOUS
  Filled 2017-06-05: qty 200

## 2017-06-05 MED ORDER — METRONIDAZOLE 500 MG PO TABS
500.0000 mg | ORAL_TABLET | Freq: Once | ORAL | Status: AC
  Administered 2017-06-05: 500 mg via ORAL
  Filled 2017-06-05: qty 1

## 2017-06-05 MED ORDER — ONDANSETRON 4 MG PO TBDP: 4.0000 mg | ORAL_TABLET | Freq: Three times a day (TID) | ORAL | 0 refills | Status: DC | PRN

## 2017-06-05 MED ORDER — CIPROFLOXACIN HCL 500 MG PO TABS
500.0000 mg | ORAL_TABLET | Freq: Once | ORAL | Status: AC
  Administered 2017-06-05: 500 mg via ORAL
  Filled 2017-06-05: qty 1

## 2017-06-05 MED ORDER — LEVOFLOXACIN 500 MG PO TABS: 500.0000 mg | ORAL_TABLET | Freq: Every day | ORAL | 0 refills | Status: DC

## 2017-06-05 MED ORDER — IOPAMIDOL (ISOVUE-300) INJECTION 61%
100.0000 mL | Freq: Once | INTRAVENOUS | Status: AC | PRN
  Administered 2017-06-05: 100 mL via INTRAVENOUS

## 2017-06-05 MED ORDER — IOPAMIDOL (ISOVUE-300) INJECTION 61%
INTRAVENOUS | Status: AC
  Filled 2017-06-05: qty 100

## 2017-06-05 NOTE — ED Notes (Signed)
Patient transported to CT 

## 2017-06-05 NOTE — ED Notes (Signed)
Dr.Haviland notified that pt had been placed on O2 during prior shift due to SPO2 saturation at 88%. At this time pt is at 89% on room air.

## 2017-06-05 NOTE — ED Notes (Signed)
After speaking with Dr.Haviland pt is to be discharged as directed. MD aware of SPO2 ranging from 88-91 on room air.

## 2017-06-05 NOTE — ED Notes (Signed)
Bed: WA20 Expected date:  Expected time:  Means of arrival:  Comments:

## 2017-06-05 NOTE — ED Notes (Signed)
Patient transported to X-ray 

## 2017-06-05 NOTE — ED Provider Notes (Signed)
Pt signed out from Dr. Tomi Bamberger awaiting results of CT scan.  The pt did fine with the IV contrast.  Her pain and nausea are better.  Ct did show diverticulitis.  Pt given first dose of cipro and flagyl and will be d/c on levaquin (due to insurance) and flagyl.  She knows to return if worse.  F/u with pcp.   Isla Pence, MD 06/05/17 [phone]

## 2017-06-05 NOTE — ED Notes (Signed)
ED Provider at bedside. 

## 2017-06-05 NOTE — ED Notes (Signed)
Patient requesting nausea medication. MD made aware.

## 2017-06-05 NOTE — ED Triage Notes (Signed)
EMS states pt reports vomiting for 3 weeks, pain in lower rt quad pain started yesterday morning. [id number]

## 2017-06-05 NOTE — ED Provider Notes (Signed)
[facility] DEPT Provider Note   CSN: [id number] Arrival date & time: 06/05/17  1115     History   Chief Complaint Chief Complaint  Patient presents with  . Emesis    HPI Monica Ellis is a 56 y.o. female.  HPI Patient presents to the emergency room with complaints of abdominal pain and vomiting.  Patient states symptoms started a couple weeks ago initially with just nausea and vomiting and low-grade fevers. Temperatures up to 100.  She was having nausea and intermittent episodes of vomiting but was not having any abdominal pain initially. She went to see her primary care doctor within the patient states that her doctor was not sure what was causing her symptoms. Today she started developing severe pain in her lower abdomen. Pain is greatest in the right side. She also feels like her abdomen is bloated. She has had normal bowel movements. Denies any constipation. She denies any history of bowel obstruction. She does have a history of cholecystectomy Past Medical History:  Diagnosis Date  . Autoimmune hepatitis ([place])   . Diabetes mellitus without complication ([place])   . GERD (gastroesophageal reflux disease)   . Hypertension   . Lupus   . Shortness of breath dyspnea     Patient Active Problem List   Diagnosis Date Noted  . Acute maxillary sinusitis 10/13/2016  . Abnormal LFTs   . Pain in the chest   . Diabetes mellitus with complication ([place])   . Hypoxia 12/29/2015  . Uncontrolled type 2 diabetes mellitus ([place]) 12/29/2015  . Dyspnea 12/29/2015  . LLQ pain 12/29/2015  . Lupus 12/29/2015  . Chest pain 12/29/2015  . Transaminitis 12/29/2015  . Chronic pain 12/29/2015  . Depression 12/29/2015    Past Surgical History:  Procedure Laterality Date  . CHOLECYSTECTOMY    . DILATION AND CURETTAGE OF UTERUS      OB History    No data available       Home Medications    Prior to Admission medications   Medication Sig Start Date End Date Taking? Authorizing  Provider  amLODipine (NORVASC) 10 MG tablet Take 10 mg by mouth at bedtime.    Yes [provider]  Cholecalciferol (VITAMIN D PO) Take 500 mg by mouth daily.   Yes [provider]  DULoxetine (CYMBALTA) 60 MG capsule Take 1 capsule by mouth at bedtime.  08/26/16  Yes [provider]  fluticasone (FLONASE) 50 MCG/ACT nasal spray Place 1 spray into both nostrils daily. 02/11/17  Yes Caudill, Gwynneth Aliment, MD  HYDROcodone-acetaminophen (NORCO/VICODIN) 5-325 MG tablet Take 1-2 tablets by mouth every 6 (six) hours as needed for moderate pain. 09/01/16  Yes [name], PA-C  insulin aspart (NOVOLOG) 100 UNIT/ML injection Inject 30 Units into the skin daily before lunch.    Yes [provider]  Insulin Aspart Prot & Aspart (NOVOLOG MIX 70/30 FLEXPEN [place]) Inject 90 Units into the skin 3 (three) times daily.    Yes [provider]  magnesium 30 MG tablet Take 30 mg by mouth daily.   Yes [provider]  Multiple Vitamin (MULTIVITAMIN) tablet Take 1 tablet by mouth daily.   Yes [provider]  nadolol (CORGARD) 40 MG tablet Take 40 mg by mouth daily. 09/16/16  Yes [provider]  Omega-3 Fatty Acids (FISH OIL PO) Take 1 capsule by mouth daily.   Yes [provider]  omeprazole (PRILOSEC) 40 MG capsule Take 40 mg by mouth daily. 06/03/17  Yes [provider]  Oxycodone HCl 10 MG TABS Take 10 mg by mouth every 6 (six) hours as needed (pain).   Yes [provider]  potassium chloride SA (K-DUR,KLOR-CON) 20 MEQ tablet Take 20 mEq by mouth 2 (two) times daily.   Yes [provider]  TURMERIC PO Take 400 mg by mouth daily.   Yes [provider]    Family History Family History  Problem Relation Age of Onset  . Diabetes Father   . Hypertension Father     Social History Social History  Substance Use Topics  . Smoking status: Current Every Day Smoker    Packs/day: 1.00    Years: 30.00      Types: Cigarettes  . Smokeless tobacco: Never Used  . Alcohol use No     Allergies   Ace inhibitors; Lisinopril; Tramadol; Iodinated diagnostic agents; Iodine; Janumet [sitagliptin-metformin hcl]; and Victoza [liraglutide]   Review of Systems Review of Systems  All other systems reviewed and are negative.    Physical Exam Updated Vital Signs BP 128/78   Pulse 79   Temp 99.6 F (37.6 C) (Oral)   Resp 17   Ht 1.676 m (_0 )   Wt 122.5 kg (270 lb)   SpO2 92%   BMI 43.58 kg/m   Physical Exam  Constitutional:  Obese  HENT:  Head: Normocephalic and atraumatic.  Right Ear: External ear normal.  Left Ear: External ear normal.  Eyes: Conjunctivae are normal. Right eye exhibits no discharge. Left eye exhibits no discharge. No scleral icterus.  Neck: Neck supple. No tracheal deviation present.  Cardiovascular: Normal rate, regular rhythm and intact distal pulses.   Pulmonary/Chest: Effort normal and breath sounds normal. No stridor. No respiratory distress. She has no wheezes. She has no rales.  Abdominal: Soft. Bowel sounds are normal. She exhibits distension (distension vs obesity, ). There is tenderness in the right lower quadrant. There is no rigidity, no rebound and no guarding. No hernia.  Abdomen is protuberant, no tympany  Musculoskeletal: She exhibits no edema or tenderness.  Neurological: She is alert. She has normal strength. No cranial nerve deficit (no facial droop, extraocular movements intact, no slurred speech) or sensory deficit. She exhibits normal muscle tone. She displays no seizure activity. Coordination normal.  Skin: Skin is warm and dry. No rash noted. She is not diaphoretic.  Psychiatric: She has a normal mood and affect.  Nursing note and vitals reviewed.    ED Treatments / Results  Labs (all labs ordered are listed, but only abnormal results are displayed) Labs Reviewed  CBC - Abnormal; Notable for the following:       Result Value   WBC  10.8 (*)    All other components within normal limits  URINALYSIS, ROUTINE W REFLEX MICROSCOPIC - Abnormal; Notable for the following:    Color, Urine AMBER (*)    Hgb urine dipstick SMALL (*)    Protein, ur >=300 (*)    Bacteria, UA RARE (*)    Squamous Epithelial / LPF 0-5 (*)    All other components within normal limits  COMPREHENSIVE METABOLIC PANEL - Abnormal; Notable for the following:    Sodium 130 (*)    CO2 19 (*)    Glucose, Bld 151 (*)    Calcium 8.6 (*)    Total Protein 12.0 (*)    Albumin 2.9 (*)    ALT 339 (*)    Total Bilirubin 1.3 (*)    All other components within normal limits  CBG MONITORING,  ED - Abnormal; Notable for the following:    Glucose-Capillary 168 (*)    All other components within normal limits  LIPASE, BLOOD    EKG  EKG Interpretation None       Radiology Dg Abd Acute W/chest  Result Date: 06/05/2017 CLINICAL DATA:  Vomiting for the last 3 weeks. Right lower quadrant pain beginning yesterday. EXAM: DG ABDOMEN ACUTE W/ 1V CHEST COMPARISON:  02/11/2017 FINDINGS: One-view chest shows normal heart size. There is aortic atherosclerosis. There is patchy atelectasis in both lower lobes. Two view abdominal films do not show evidence of ileus, obstruction or free air. Clips in the right upper quadrant consistent with previous cholecystectomy. Curvature in degenerative change of the spine. 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 IMPRESSION: Atelectasis in both lung bases. No acute intraabdominal finding.  Previous cholecystectomy. Electronically Signed   By: Nelson Chimes M.D.   On: 06/05/2017 12:53    Procedures Procedures (including critical care time)  Medications Ordered in ED Medications  sodium chloride 0.9 % bolus 1,000 mL (0 mLs Intravenous Stopped 06/05/17 1329)    And  0.9 %  sodium chloride infusion ( Intravenous New Bag/Given 06/05/17 1454)  HYDROmorphone (DILAUDID) injection 0.5 mg (0.5 mg Intravenous Given 06/05/17 1555)  diphenhydrAMINE  (BENADRYL) capsule 50 mg (not administered)  ondansetron (ZOFRAN) injection 4 mg (4 mg Intravenous Given 06/05/17 1154)  hydrocortisone sodium succinate (SOLU-CORTEF) injection 200 mg (200 mg Intravenous Given 06/05/17 1451)  ondansetron (ZOFRAN) injection 4 mg (4 mg Intravenous Given 06/05/17 1555)     Initial Impression / Assessment and Plan / ED Course  I have reviewed the triage vital signs and the nursing notes.  Pertinent labs & imaging results that were available during my care of the patient were reviewed by me and considered in my medical decision making (see chart for details).  Clinical Course as of Jun 05 1649  Sun Jun 05, 2017  1406 Pt has been having persistent nausea and vomiting.  [JK]  1407 Elevated WBC.   No definite etiology based on her labs and xray.  Will ct to evaluate further.  Pt will need to do the allergy protocol  [JK]    Clinical Course User Index [JK] Dorie Rank, MD    Pt presents with abdominal pain and vomiting.  Bo clear etiology based on lab tests.  Pt has elevated lfts but she has history of autoimmune heptaitis.  Labs abd AAS otherwise non diagnostic.  CT scan pending.  Final Clinical Impressions(s) / ED Diagnoses  pending   Dorie Rank, MD 06/05/17 [phone]

## 2017-06-07 ENCOUNTER — Inpatient Hospital Stay (HOSPITAL_COMMUNITY)
Admission: EM | Admit: 2017-06-07 | Discharge: 2017-06-09 | DRG: 392 | Disposition: A | Discharge status: Home / Self Care | Payer: Medicare HMO | Attending: Internal Medicine | Admitting: Internal Medicine

## 2017-06-07 ENCOUNTER — Encounter (HOSPITAL_COMMUNITY): Payer: Self-pay | Admitting: Emergency Medicine

## 2017-06-07 ENCOUNTER — Inpatient Hospital Stay (HOSPITAL_COMMUNITY): Payer: Medicare HMO

## 2017-06-07 DIAGNOSIS — Z833 Family history of diabetes mellitus: Secondary | ICD-10-CM

## 2017-06-07 DIAGNOSIS — Z79899 Other long term (current) drug therapy: Secondary | ICD-10-CM

## 2017-06-07 DIAGNOSIS — I10 Essential (primary) hypertension: Secondary | ICD-10-CM | POA: Diagnosis present

## 2017-06-07 DIAGNOSIS — F17200 Nicotine dependence, unspecified, uncomplicated: Secondary | ICD-10-CM | POA: Diagnosis present

## 2017-06-07 DIAGNOSIS — K5792 Diverticulitis of intestine, part unspecified, without perforation or abscess without bleeding: Secondary | ICD-10-CM

## 2017-06-07 DIAGNOSIS — M329 Systemic lupus erythematosus, unspecified: Secondary | ICD-10-CM | POA: Diagnosis present

## 2017-06-07 DIAGNOSIS — E1165 Type 2 diabetes mellitus with hyperglycemia: Secondary | ICD-10-CM | POA: Diagnosis present

## 2017-06-07 DIAGNOSIS — K5732 Diverticulitis of large intestine without perforation or abscess without bleeding: Secondary | ICD-10-CM | POA: Diagnosis present

## 2017-06-07 DIAGNOSIS — Z9109 Other allergy status, other than to drugs and biological substances: Secondary | ICD-10-CM | POA: Diagnosis not present

## 2017-06-07 DIAGNOSIS — IMO0002 Reserved for concepts with insufficient information to code with codable children: Secondary | ICD-10-CM | POA: Diagnosis present

## 2017-06-07 DIAGNOSIS — D72829 Elevated white blood cell count, unspecified: Secondary | ICD-10-CM | POA: Diagnosis present

## 2017-06-07 DIAGNOSIS — E1143 Type 2 diabetes mellitus with diabetic autonomic (poly)neuropathy: Secondary | ICD-10-CM | POA: Diagnosis present

## 2017-06-07 DIAGNOSIS — L930 Discoid lupus erythematosus: Secondary | ICD-10-CM | POA: Diagnosis present

## 2017-06-07 DIAGNOSIS — K219 Gastro-esophageal reflux disease without esophagitis: Secondary | ICD-10-CM | POA: Diagnosis present

## 2017-06-07 DIAGNOSIS — Z8249 Family history of ischemic heart disease and other diseases of the circulatory system: Secondary | ICD-10-CM | POA: Diagnosis not present

## 2017-06-07 DIAGNOSIS — F329 Major depressive disorder, single episode, unspecified: Secondary | ICD-10-CM | POA: Diagnosis present

## 2017-06-07 DIAGNOSIS — R945 Abnormal results of liver function studies: Secondary | ICD-10-CM | POA: Diagnosis present

## 2017-06-07 DIAGNOSIS — F3289 Other specified depressive episodes: Secondary | ICD-10-CM | POA: Diagnosis not present

## 2017-06-07 DIAGNOSIS — Z91041 Radiographic dye allergy status: Secondary | ICD-10-CM | POA: Diagnosis not present

## 2017-06-07 DIAGNOSIS — Z7951 Long term (current) use of inhaled steroids: Secondary | ICD-10-CM

## 2017-06-07 DIAGNOSIS — R7989 Other specified abnormal findings of blood chemistry: Secondary | ICD-10-CM | POA: Diagnosis present

## 2017-06-07 DIAGNOSIS — Z6841 Body Mass Index (BMI) 40.0 and over, adult: Secondary | ICD-10-CM

## 2017-06-07 DIAGNOSIS — E876 Hypokalemia: Secondary | ICD-10-CM | POA: Diagnosis present

## 2017-06-07 DIAGNOSIS — Z794 Long term (current) use of insulin: Secondary | ICD-10-CM

## 2017-06-07 DIAGNOSIS — J9811 Atelectasis: Secondary | ICD-10-CM | POA: Diagnosis present

## 2017-06-07 DIAGNOSIS — E871 Hypo-osmolality and hyponatremia: Secondary | ICD-10-CM | POA: Diagnosis present

## 2017-06-07 DIAGNOSIS — F32A Depression, unspecified: Secondary | ICD-10-CM | POA: Diagnosis present

## 2017-06-07 DIAGNOSIS — R9431 Abnormal electrocardiogram [ECG] [EKG]: Secondary | ICD-10-CM

## 2017-06-07 DIAGNOSIS — F17210 Nicotine dependence, cigarettes, uncomplicated: Secondary | ICD-10-CM | POA: Diagnosis present

## 2017-06-07 HISTORY — DX: Nicotine dependence, unspecified, uncomplicated: F17.200

## 2017-06-07 HISTORY — DX: Morbid (severe) obesity due to excess calories: E66.01

## 2017-06-07 LAB — MAGNESIUM: MAGNESIUM: 1.5 mg/dL — AB (ref 1.7–2.4)

## 2017-06-07 LAB — CBC WITH DIFFERENTIAL/PLATELET
BASOS PCT: 0 %
Basophils Absolute: 0 10*3/uL (ref 0.0–0.1)
EOS ABS: 0.2 10*3/uL (ref 0.0–0.7)
EOS PCT: 2 %
HEMATOCRIT: 36.1 % (ref 36.0–46.0)
Hemoglobin: 12.4 g/dL (ref 12.0–15.0)
Lymphocytes Relative: 43 %
Lymphs Abs: 3.9 10*3/uL (ref 0.7–4.0)
MCH: 32.2 pg (ref 26.0–34.0)
MCHC: 34.3 g/dL (ref 30.0–36.0)
MCV: 93.8 fL (ref 78.0–100.0)
MONO ABS: 0.7 10*3/uL (ref 0.1–1.0)
MONOS PCT: 8 %
Neutro Abs: 4.3 10*3/uL (ref 1.7–7.7)
Neutrophils Relative %: 47 %
Platelets: 202 10*3/uL (ref 150–400)
RBC: 3.85 MIL/uL — ABNORMAL LOW (ref 3.87–5.11)
RDW: 14.8 % (ref 11.5–15.5)
WBC: 9.1 10*3/uL (ref 4.0–10.5)

## 2017-06-07 LAB — COMPREHENSIVE METABOLIC PANEL
ALBUMIN: 2.8 g/dL — AB (ref 3.5–5.0)
ALT: 266 U/L — ABNORMAL HIGH (ref 14–54)
ANION GAP: 3 — AB (ref 5–15)
AST: 230 U/L — ABNORMAL HIGH (ref 15–41)
Alkaline Phosphatase: 58 U/L (ref 38–126)
BILIRUBIN TOTAL: 0.9 mg/dL (ref 0.3–1.2)
BUN: 16 mg/dL (ref 6–20)
CO2: 23 mmol/L (ref 22–32)
Calcium: 8 mg/dL — ABNORMAL LOW (ref 8.9–10.3)
Chloride: 107 mmol/L (ref 101–111)
Creatinine, Ser: 0.96 mg/dL (ref 0.44–1.00)
GFR calc Af Amer: >60 mL/min (ref >60)
GFR calc non Af Amer: >60 mL/min (ref >60)
GLUCOSE: 91 mg/dL (ref 65–99)
POTASSIUM: 3.4 mmol/L — AB (ref 3.5–5.1)
SODIUM: 133 mmol/L — AB (ref 135–145)
TOTAL PROTEIN: 11.7 g/dL — AB (ref 6.5–8.1)

## 2017-06-07 LAB — URINALYSIS, ROUTINE W REFLEX MICROSCOPIC
BILIRUBIN URINE: NEGATIVE
GLUCOSE, UA: NEGATIVE mg/dL
Ketones, ur: NEGATIVE mg/dL
Nitrite: NEGATIVE
PH: 5 (ref 5.0–8.0)
Protein, ur: 100 mg/dL — AB
SPECIFIC GRAVITY, URINE: 1.018 (ref 1.005–1.030)

## 2017-06-07 LAB — PHOSPHORUS: Phosphorus: 2.5 mg/dL (ref 2.5–4.6)

## 2017-06-07 LAB — GLUCOSE, CAPILLARY
GLUCOSE-CAPILLARY: 86 mg/dL (ref 65–99)
Glucose-Capillary: 94 mg/dL (ref 65–99)
Glucose-Capillary: 112 mg/dL — ABNORMAL HIGH (ref 65–99)

## 2017-06-07 MED ORDER — POTASSIUM CHLORIDE IN NACL 20-0.9 MEQ/L-% IV SOLN
INTRAVENOUS | Status: AC
  Administered 2017-06-07: 10:00:00 via INTRAVENOUS
  Filled 2017-06-07: qty 1000

## 2017-06-07 MED ORDER — HEPARIN SODIUM (PORCINE) 5000 UNIT/ML IJ SOLN
5000.0000 [IU] | Freq: Three times a day (TID) | INTRAMUSCULAR | Status: DC
  Administered 2017-06-07 – 2017-06-09 (×6): 5000 [IU] via SUBCUTANEOUS
  Filled 2017-06-07 (×6): qty 1

## 2017-06-07 MED ORDER — METOCLOPRAMIDE HCL 5 MG/ML IJ SOLN
10.0000 mg | Freq: Once | INTRAMUSCULAR | Status: AC
Start: 2017-06-07 — End: 2017-06-07
  Administered 2017-06-07: 10 mg via INTRAVENOUS
  Filled 2017-06-07: qty 2

## 2017-06-07 MED ORDER — INSULIN ASPART 100 UNIT/ML ~~LOC~~ SOLN
0.0000 [IU] | Freq: Every day | SUBCUTANEOUS | Status: DC
  Administered 2017-06-07 – 2017-06-08 (×2): 0 [IU] via SUBCUTANEOUS

## 2017-06-07 MED ORDER — METOCLOPRAMIDE HCL 5 MG/ML IJ SOLN
5.0000 mg | Freq: Four times a day (QID) | INTRAMUSCULAR | Status: DC
  Administered 2017-06-07 – 2017-06-09 (×9): 5 mg via INTRAVENOUS
  Filled 2017-06-07 (×9): qty 2

## 2017-06-07 MED ORDER — ALBUTEROL SULFATE (2.5 MG/3ML) 0.083% IN NEBU: 2.5000 mg | INHALATION_SOLUTION | Freq: Four times a day (QID) | RESPIRATORY_TRACT | Status: DC | PRN

## 2017-06-07 MED ORDER — FENTANYL CITRATE (PF) 100 MCG/2ML IJ SOLN
50.0000 ug | Freq: Once | INTRAMUSCULAR | Status: AC
  Administered 2017-06-07: 50 ug via INTRAVENOUS
  Filled 2017-06-07: qty 2

## 2017-06-07 MED ORDER — ONDANSETRON HCL 4 MG PO TABS
4.0000 mg | ORAL_TABLET | Freq: Four times a day (QID) | ORAL | Status: DC | PRN
  Administered 2017-06-08: 4 mg via ORAL
  Filled 2017-06-07: qty 1

## 2017-06-07 MED ORDER — SODIUM CHLORIDE 0.9 % IV BOLUS (SEPSIS)
500.0000 mL | Freq: Once | INTRAVENOUS | Status: AC
  Administered 2017-06-07: 500 mL via INTRAVENOUS

## 2017-06-07 MED ORDER — AMLODIPINE BESYLATE 10 MG PO TABS
10.0000 mg | ORAL_TABLET | Freq: Every day | ORAL | Status: DC
  Administered 2017-06-07 – 2017-06-08 (×2): 10 mg via ORAL
  Filled 2017-06-07 (×2): qty 1

## 2017-06-07 MED ORDER — IBUPROFEN 200 MG PO TABS
400.0000 mg | ORAL_TABLET | Freq: Once | ORAL | Status: AC
  Administered 2017-06-07: 400 mg via ORAL
  Filled 2017-06-07: qty 2

## 2017-06-07 MED ORDER — IPRATROPIUM BROMIDE 0.02 % IN SOLN: 0.5000 mg | Freq: Four times a day (QID) | RESPIRATORY_TRACT | Status: DC | PRN

## 2017-06-07 MED ORDER — CIPROFLOXACIN IN D5W 400 MG/200ML IV SOLN
400.0000 mg | Freq: Two times a day (BID) | INTRAVENOUS | Status: DC
  Administered 2017-06-07 – 2017-06-09 (×5): 400 mg via INTRAVENOUS
  Filled 2017-06-07 (×5): qty 200

## 2017-06-07 MED ORDER — FLUTICASONE PROPIONATE 50 MCG/ACT NA SUSP
1.0000 | Freq: Every day | NASAL | Status: DC
  Administered 2017-06-07 – 2017-06-09 (×3): 1 via NASAL
  Filled 2017-06-07: qty 16

## 2017-06-07 MED ORDER — METRONIDAZOLE IN NACL 5-0.79 MG/ML-% IV SOLN
500.0000 mg | Freq: Three times a day (TID) | INTRAVENOUS | Status: DC
  Administered 2017-06-07 – 2017-06-08 (×4): 500 mg via INTRAVENOUS
  Filled 2017-06-07 (×5): qty 100

## 2017-06-07 MED ORDER — MAGNESIUM SULFATE 4 GM/100ML IV SOLN
4.0000 g | Freq: Once | INTRAVENOUS | Status: AC
  Administered 2017-06-07: 4 g via INTRAVENOUS
  Filled 2017-06-07: qty 100

## 2017-06-07 MED ORDER — INSULIN ASPART PROT & ASPART (70-30 MIX) 100 UNIT/ML ~~LOC~~ SUSP
90.0000 [IU] | Freq: Three times a day (TID) | SUBCUTANEOUS | Status: DC
  Administered 2017-06-07 – 2017-06-09 (×3): 90 [IU] via SUBCUTANEOUS
  Filled 2017-06-07: qty 10

## 2017-06-07 MED ORDER — NADOLOL 40 MG PO TABS
40.0000 mg | ORAL_TABLET | Freq: Every day | ORAL | Status: DC
  Administered 2017-06-07 – 2017-06-08 (×2): 40 mg via ORAL
  Filled 2017-06-07 (×2): qty 1

## 2017-06-07 MED ORDER — URSODIOL 300 MG PO CAPS
300.0000 mg | ORAL_CAPSULE | Freq: Three times a day (TID) | ORAL | Status: DC
  Administered 2017-06-08 – 2017-06-09 (×4): 300 mg via ORAL
  Filled 2017-06-07 (×5): qty 1

## 2017-06-07 MED ORDER — ONDANSETRON HCL 4 MG/2ML IJ SOLN
4.0000 mg | Freq: Four times a day (QID) | INTRAMUSCULAR | Status: DC | PRN
  Administered 2017-06-08: 4 mg via INTRAVENOUS
  Filled 2017-06-07 (×3): qty 2

## 2017-06-07 MED ORDER — INSULIN ASPART 100 UNIT/ML ~~LOC~~ SOLN
0.0000 [IU] | Freq: Three times a day (TID) | SUBCUTANEOUS | Status: DC
  Administered 2017-06-08 (×3): 2 [IU] via SUBCUTANEOUS
  Administered 2017-06-09 (×2): 3 [IU] via SUBCUTANEOUS

## 2017-06-07 MED ORDER — POTASSIUM CHLORIDE IN NACL 20-0.9 MEQ/L-% IV SOLN
INTRAVENOUS | Status: AC
  Administered 2017-06-07: 13:00:00 via INTRAVENOUS
  Filled 2017-06-07 (×2): qty 1000

## 2017-06-07 MED ORDER — PANTOPRAZOLE SODIUM 40 MG IV SOLR
40.0000 mg | INTRAVENOUS | Status: DC
  Administered 2017-06-07 – 2017-06-08 (×2): 40 mg via INTRAVENOUS
  Filled 2017-06-07 (×2): qty 40

## 2017-06-07 MED ORDER — INSULIN ASPART PROT & ASPART (70-30 MIX) 100 UNIT/ML PEN: 90.0000 [IU] | PEN_INJECTOR | Freq: Three times a day (TID) | SUBCUTANEOUS | Status: DC

## 2017-06-07 MED ORDER — HYDROMORPHONE HCL 1 MG/ML IJ SOLN
1.0000 mg | INTRAMUSCULAR | Status: DC | PRN
  Administered 2017-06-07 – 2017-06-09 (×6): 1 mg via INTRAVENOUS
  Filled 2017-06-07 (×6): qty 1

## 2017-06-07 MED ORDER — NICOTINE 21 MG/24HR TD PT24
21.0000 mg | MEDICATED_PATCH | Freq: Every day | TRANSDERMAL | Status: DC
  Administered 2017-06-07: 21 mg via TRANSDERMAL
  Filled 2017-06-07 (×2): qty 1

## 2017-06-07 MED ORDER — DULOXETINE HCL 60 MG PO CPEP
60.0000 mg | ORAL_CAPSULE | Freq: Every day | ORAL | Status: DC
  Administered 2017-06-07 – 2017-06-08 (×2): 60 mg via ORAL
  Filled 2017-06-07 (×2): qty 1

## 2017-06-07 NOTE — ED Triage Notes (Signed)
Per EMS, pt coming from home complaining of right lower quadrant pain and nausea. Was seen 2 days ago for same. Pt diagnosed with diverticulitis at last visit. Pt reports being given prescriptions for an antibiotic and oxycodone at discharge.  Pt reports regular bowel movements. Pt reports history of lupus.  126/78 HR 88 resp 36 CBG 187

## 2017-06-07 NOTE — ED Notes (Signed)
Walked in pt's room in order to obtain IV access and administer meds, pt in tears, and stating she wants to leave  AMA. Dr Laverta Baltimore made ware and is at bedside.

## 2017-06-07 NOTE — Progress Notes (Signed)
  Echocardiogram 2D Echocardiogram has been performed.  Monica Ellis 06/07/2017, 6:02 PM

## 2017-06-07 NOTE — ED Notes (Signed)
Bed: KP53 Expected date:  Expected time:  Means of arrival:  Comments: 38 yoF/ Abd pain

## 2017-06-07 NOTE — ED Notes (Signed)
Pt. Stated that she was unable to urinate at this time. Will collect urine when pt. Voids. Nurse aware.

## 2017-06-07 NOTE — H&P (Signed)
History and Physical    Monica Ellis TLX:[id number] DOB: 1961-01-18 DOA: 06/07/2017  PCP: Brock Ra, PA-C   Patient coming from: Home.  I have personally briefly reviewed patient's old medical records in [place]  Chief Complaint: Abdominal pain and nausea.  HPI: Monica Ellis is a 56 y.o. female with medical history significant of lupus, autoimmune hepatitis, type 2 diabetes, GERD, hypertension, morbid obesity, tobacco use disorder who is returning to the emergency department for the second time in 3 days due to abdominal pain and nausea.  Per patient, since more than 3 weeks she has been having abdominal pain and nausea. She describes the pain as persistent, occasionally sharp and located on the RUQ. For the past 2+ weeks, she has been having daily fevers around 17-1800. She also mentions having at least 2 episodes of emesis twice a day. She mentions that her fluid/food intake has decreased to avoid exacerbating symptoms and she has had a productive cough of yellowish sputum for the past 3 days, but denies wheezing or dyspnea.. She denies diarrhea, constipation, melena or hematochezia. She denies dysuria, frequency or hematuria. She denies earaches, headaches, sore throat, chest pain, palpitations, diaphoresis, pitting edema of the lower extremities, PND or orthopnea.   She was seen on Friday by her PCP who prescribed her Phenergan and referred her to GI later this month, but states that this did not relieve her symptoms. She was seen in the emergency department on Sunday (2 days ago). She was discharged on analgesics and oral antibiotics, but returns today due to persistence of the symptoms despite the oral antibiotic therapy.  ED Course: Initial vital signs in the emergency department at temperature 98.7, pulse 79, blood pressure 131/74 mmHg, respirations 20 and O2 sat 92% on room air. WBC is 9.1, hemoglobin 12.4 g/dL and platelets 202. Sodium 133, potassium 3.4,  chloride 107 and bicarbonate 23 mmol/L. BUN was 16, creatinine 0.96, calcium 8.0, glucose 91, magnesium 1.5 and phosphorus 2.5 mg/dL.  Imaging: CT scan abdomen/pelvis done on 06/05/2017 shows acute uncomplicated diverticulitis at the junction of descending and sigmoid colon. She also has hepatomegaly with hepatic steatosis and nodular hepatic contours. Her chest radiograph, also done this past Sunday, showed bibasilar atelectasis. Please see images and full radiology report for further details.   Review of Systems: As per HPI otherwise 10 point review of systems negative.    Past Medical History:  Diagnosis Date  . Autoimmune hepatitis ([place])   . Diabetes mellitus without complication ([place])   . GERD (gastroesophageal reflux disease)   . Hypertension   . Lupus   . Shortness of breath dyspnea     Past Surgical History:  Procedure Laterality Date  . CHOLECYSTECTOMY    . DILATION AND CURETTAGE OF UTERUS       reports that she has been smoking Cigarettes.  She has a 30.00 pack-year smoking history. She has never used smokeless tobacco. She reports that she does not drink alcohol or use drugs.  Allergies  Allergen Reactions  . Ace Inhibitors Swelling  . Lisinopril Swelling and Other (See Comments)    Other reaction(s): Angioedema (ALLERGY/intolerance), Face  . Tramadol Other (See Comments)    Other reaction(s): Mental Status Changes (intolerance)  . Iodinated Diagnostic Agents Swelling    When patient was in her 20's, swelled up all over after getting getting injection of contrast; did not have any other symptoms/ was given benadryl after that happened and had no further problems per patients/  . Iodine Swelling  .  Janumet [Sitagliptin-Metformin Hcl] Other (See Comments)    Continuous yeast infection  . Victoza [Liraglutide] Nausea And Vomiting    Family History  Problem Relation Age of Onset  . Diabetes Father   . Hypertension Father     Prior to Admission medications     Medication Sig Start Date End Date Taking? Authorizing Provider  amLODipine (NORVASC) 10 MG tablet Take 10 mg by mouth at bedtime.    Yes [provider]  Cholecalciferol (VITAMIN D PO) Take 500 mg by mouth daily.   Yes [provider]  DULoxetine (CYMBALTA) 60 MG capsule Take 1 capsule by mouth at bedtime.  08/26/16  Yes [provider]  fluticasone (FLONASE) 50 MCG/ACT nasal spray Place 1 spray into both nostrils daily. 02/11/17  Yes Caudill, Gwynneth Aliment, MD  insulin aspart (NOVOLOG) 100 UNIT/ML injection Inject 30 Units into the skin daily before lunch.    Yes [provider]  Insulin Aspart Prot & Aspart (NOVOLOG MIX 70/30 FLEXPEN [place]) Inject 90 Units into the skin 3 (three) times daily.    Yes [provider]  levofloxacin (LEVAQUIN) 500 MG tablet Take 1 tablet (500 mg total) by mouth daily. 06/05/17  Yes Isla Pence, MD  magnesium 30 MG tablet Take 30 mg by mouth daily.   Yes [provider]  metroNIDAZOLE (FLAGYL) 500 MG tablet Take 1 tablet (500 mg total) by mouth 2 (two) times daily. 06/05/17  Yes Isla Pence, MD  Multiple Vitamin (MULTIVITAMIN) tablet Take 1 tablet by mouth daily.   Yes [provider]  nadolol (CORGARD) 40 MG tablet Take 40 mg by mouth daily. 09/16/16  Yes [provider]  Omega-3 Fatty Acids (FISH OIL PO) Take 1 capsule by mouth daily.   Yes [provider]  omeprazole (PRILOSEC) 40 MG capsule Take 40 mg by mouth daily. 06/03/17  Yes [provider]  ondansetron (ZOFRAN ODT) 4 MG disintegrating tablet Take 1 tablet (4 mg total) by mouth every 8 (eight) hours as needed for nausea or vomiting. 06/05/17  Yes Isla Pence, MD  Oxycodone HCl 10 MG TABS Take 10 mg by mouth every 6 (six) hours as needed (pain).   Yes [provider]  potassium chloride SA (K-DUR,KLOR-CON) 20 MEQ tablet Take 20 mEq by mouth 2 (two) times daily.   Yes [provider]  TURMERIC PO  Take 400 mg by mouth daily.   Yes [provider]  HYDROcodone-acetaminophen (NORCO/VICODIN) 5-325 MG tablet Take 1 tablet by mouth every 4 (four) hours as needed. Patient not taking: Reported on 06/07/2017 06/05/17   Isla Pence, MD    Physical Exam: Vitals:   06/07/17 0900 06/07/17 0930 06/07/17 1000 06/07/17 1014  BP: 128/71 127/72 137/78 138/75  Pulse: 67 69 71 69  Resp:    17  Temp:      TempSrc:      SpO2: (!) 89% (!) 89% 90% 92%    Constitutional: NAD, calm, comfortable Eyes: PERRL, lids and conjunctivae normal ENMT: Mucous membranes and lips are dry. Posterior pharynx clear of any exudate or lesions. Neck: normal, supple, no masses, no thyromegaly Respiratory: Decreased breath sounds on bases, otherwise clear to auscultation bilaterally, no wheezing, no crackles. Normal respiratory effort. No accessory muscle use.  Cardiovascular: Regular rate and rhythm, no murmurs / rubs / gallops. No extremity edema. 2+ pedal pulses. No carotid bruits.  Abdomen: Soft, positive upper quadrants tenderness, no guarding/rebound/masses palpated. No hepatosplenomegaly. Bowel sounds positive.  Musculoskeletal: no clubbing / cyanosis.  Good ROM, no contractures. Normal muscle tone.  Skin: no rashes, lesions, ulcers. No induration Neurologic: CN 2-12 grossly intact. Sensation intact, DTR normal. Strength 5/5 in all 4.  Psychiatric: Normal judgment and insight. Alert and oriented x 4. Normal mood.    Labs on Admission: I have personally reviewed following labs and imaging studies  CBC:  Recent Labs Lab 06/05/17 1151 06/07/17 0754  WBC 10.8* 9.1  NEUTROABS  --  4.3  HGB 13.8 12.4  HCT 39.2 36.1  MCV 92.0 93.8  PLT 222 646   Basic Metabolic Panel:  Recent Labs Lab 06/05/17 1223 06/07/17 0754  NA 130* 133*  K 3.8 3.4*  CL 103 107  CO2 19* 23  GLUCOSE 151* 91  BUN 14 16  CREATININE 0.97 0.96  CALCIUM 8.6* 8.0*  MG  --  1.5*  PHOS  --  2.5   GFR: Estimated Creatinine  Clearance: 85.3 mL/min (by C-G formula based on SCr of 0.96 mg/dL). Liver Function Tests:  Recent Labs Lab 06/05/17 1223 06/07/17 0754  AST QUANTITY NOT SUFFICIENT, UNABLE TO PERFORM TEST 230*  ALT 339* 266*  ALKPHOS 63 58  BILITOT 1.3* 0.9  PROT 12.0* 11.7*  ALBUMIN 2.9* 2.8*    Recent Labs Lab 06/05/17 1223  LIPASE 25   No results for input(s): AMMONIA in the last 168 hours. Coagulation Profile: No results for input(s): INR, PROTIME in the last 168 hours. Cardiac Enzymes: No results for input(s): CKTOTAL, CKMB, CKMBINDEX, TROPONINI in the last 168 hours. BNP (last 3 results) No results for input(s): PROBNP in the last 8760 hours. HbA1C: No results for input(s): HGBA1C in the last 72 hours. CBG:  Recent Labs Lab 06/05/17 1134  GLUCAP 168*   Lipid Profile: No results for input(s): CHOL, HDL, LDLCALC, TRIG, CHOLHDL, LDLDIRECT in the last 72 hours. Thyroid Function Tests: No results for input(s): TSH, T4TOTAL, FREET4, T3FREE, THYROIDAB in the last 72 hours. Anemia Panel: No results for input(s): VITAMINB12, FOLATE, FERRITIN, TIBC, IRON, RETICCTPCT in the last 72 hours. Urine analysis:    Component Value Date/Time   COLORURINE AMBER (A) 06/05/2017 1328   APPEARANCEUR CLEAR 06/05/2017 1328   LABSPEC 1.022 06/05/2017 1328   PHURINE 5.0 06/05/2017 1328   GLUCOSEU NEGATIVE 06/05/2017 1328   HGBUR SMALL (A) 06/05/2017 1328   BILIRUBINUR NEGATIVE 06/05/2017 1328   KETONESUR NEGATIVE 06/05/2017 1328   PROTEINUR >=300 (A) 06/05/2017 1328   NITRITE NEGATIVE 06/05/2017 1328   LEUKOCYTESUR NEGATIVE 06/05/2017 1328    Radiological Exams on Admission: Ct Abdomen Pelvis W Contrast  Result Date: 06/05/2017 CLINICAL DATA:  Abdominal pain and vomiting. EXAM: CT ABDOMEN AND PELVIS WITH CONTRAST TECHNIQUE: Multidetector CT imaging of the abdomen and pelvis was performed using the standard protocol following bolus administration of intravenous contrast. CONTRAST:  143m  ISOVUE-300 IOPAMIDOL (ISOVUE-300) INJECTION 61% COMPARISON:  Radiographs earlier this day.  CT 07/02/2013 FINDINGS: Lower chest: Atelectasis in both lung bases, left greater than right. No consolidation. No pleural fluid. Hepatobiliary: The liver is enlarged spanning 25 cm. There is diffusely decreased density consistent with steatosis. Capsular contours are nodular. No discrete focal lesion. Postcholecystectomy with clips in the gallbladder fossa. Expected postcholecystectomy biliary prominence. Pancreas: No ductal dilatation or inflammation. Spleen: Upper normal in size spanning 13.1 cm craniocaudal. 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 Nodularity of the right adrenal gland without dominant nodule, stable. No hydronephrosis or perinephric edema. Homogeneous renal enhancement and symmetric excretion on delayed phase  imaging. Subcentimeter low-density lesions in the right kidney are incompletely characterize due to small size. Urinary bladder is physiologically distended. Stomach/Bowel: Inflamed diverticulum at the junction of the descending and sigmoid colon with associated colonic wall thickening and pericolonic soft tissue edema. Small amount of adjacent free fluid. No perforation or abscess. Additional scattered noninflamed colonic diverticula in the descending and sigmoid colon. Moderate stool in the more proximal colon. Normal appendix. Stomach is physiologically distended. No small bowel dilatation or inflammation. Vascular/Lymphatic: Aortic atherosclerosis without aneurysm. Small retroperitoneal lymph nodes not enlarged by size criteria. Reproductive: Calcified uterine fibroids. Ovaries are quiescent and normal. No adnexal mass. Other: Small free fluid in the left pericolic gutter, likely reactive. No abdominopelvic ascites. No free air. Tiny fat containing umbilical hernia. Musculoskeletal: Degenerative disc disease and facet arthropathy in the lower  thoracic and lumbar spine, degenerative change most prominent at L4-L5. There are no acute or suspicious osseous abnormalities. IMPRESSION: 1. Acute uncomplicated diverticulitis at the junction of the descending and sigmoid colon. No perforation or abscess. 2. Hepatomegaly with hepatic steatosis. Nodular hepatic contours consistent with cirrhosis. Borderline splenomegaly. 3. Mild aortic atherosclerosis. Electronically Signed   By: Jeb Levering M.D.   On: 06/05/2017 19:22   Dg Abd Acute W/chest  Result Date: 06/05/2017 CLINICAL DATA:  Vomiting for the last 3 weeks. Right lower quadrant pain beginning yesterday. EXAM: DG ABDOMEN ACUTE W/ 1V CHEST COMPARISON:  02/11/2017 FINDINGS: One-view chest shows normal heart size. There is aortic atherosclerosis. There is patchy atelectasis in both lower lobes. Two view abdominal films do not show evidence of ileus, obstruction or free air. Clips in the right upper quadrant consistent with previous cholecystectomy. Curvature in degenerative change of the spine. 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 IMPRESSION: Atelectasis in both lung bases. No acute intraabdominal finding.  Previous cholecystectomy. Electronically Signed   By: Nelson Chimes M.D.   On: 06/05/2017 12:53    EKG: Independently reviewed   Assessment/Plan Principal Problem:   Acute diverticulitis Admit to MedSurg/inpatient. Clear liquid diet. Continue antiemetics as needed. Continue analgesics as needed. Ciprofloxacin 400 mg IVPB every 12 hours. Metronidazole 500 mg IVPB every 8 hours.  Active Problems:   Uncontrolled type 2 diabetes mellitus ([place]) Currently on clear liquid diet. Continue NovoLog 70/30 90 units SQ 3 times a day. Hold NovoLog 30 units SQ with lunch. CBG monitoring with regular insulin sliding scale while in the hospital.    Lupus Currently not on disease modifying agents. Continue analgesics as needed for pain.    Depression Continue Cymbalta 60 mg by mouth at  bedtime.    Abnormal LFTs History of autoimmune hepatitis. 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 Patient to continue weight loss strategy. (Status she has lost about 40 pounds in the last few months) Start Actigall 300 mg by mouth 3 times a day. Consider switching Cymbalta 2 different antidepressant. The patient will be seeing GI on the 20th of this month.    Hypokalemia Replacing through IV fluids. Follow-up potassium level in the morning.    Hypomagnesemia Replacing with magnesium sulfate for cramps IVPB 1. Continue daily oral supplement.    Tobacco use disorder Nicotine replacement therapy ordered. Staff to provide tobacco cessation information.    Atelectasis at both lungs Frequent incentive spirometry.        DVT prophylaxis: Heparin SQ. Code Status: Full code. Family Communication:  Disposition Plan: Admit for IV antibiotics for 2-3 days. Consults called:  Admission status: Inpatient/MedSurg.   Reubin Milan MD [facility]  Pager [phone]  If 7PM-7AM, please contact night-coverage [email or web address] Password [email or web address]  06/07/2017, 10:35 AM

## 2017-06-07 NOTE — ED Provider Notes (Signed)
[facility] DEPT Provider Note   CSN: [id number] Arrival date & time: 06/07/17  0601     History   Chief Complaint Chief Complaint  Patient presents with  . Abdominal Pain    HPI Monica Ellis is a 56 y.o. female.  HPI 56 year old African-American female past medical history significant for hypertension, lupus, diabetes that presents to the emergency department today with persistent right lower quadrant abdominal pain. The patient states that her symptoms started approximately 2 weeks ago initially with just nausea and vomiting and low-grade fevers. Patient was seen by her primary care doctor who sent her to GI. Pain become in her bowl and was seen in the ED on 7/1. At that time she had CT scan of abdomen performed that showed non-complicated diverticulitis. Patient was started on Flagyl and Cipro and oxycodone for pain. Patient is currently on a pain contract. Has not been able to control her pain with a contract that she has at home. She states she has not been able to get in touch with her pain management doctor to see shaken up her medication for the pain. She reports nausea. Denies any emesis. Bowel movements have been normal. She has been having a liquid diet. Denies any fevers. Abdominal pain is same as before just uncontrolled pain medicine. Denies any new associated symptoms. Past Medical History:  Diagnosis Date  . Autoimmune hepatitis ([place])   . Diabetes mellitus without complication ([place])   . GERD (gastroesophageal reflux disease)   . Hypertension   . Lupus   . Shortness of breath dyspnea     Patient Active Problem List   Diagnosis Date Noted  . Acute maxillary sinusitis 10/13/2016  . Abnormal LFTs   . Pain in the chest   . Diabetes mellitus with complication ([place])   . Hypoxia 12/29/2015  . Uncontrolled type 2 diabetes mellitus ([place]) 12/29/2015  . Dyspnea 12/29/2015  . LLQ pain 12/29/2015  . Lupus 12/29/2015  . Chest pain 12/29/2015  . Transaminitis  12/29/2015  . Chronic pain 12/29/2015  . Depression 12/29/2015    Past Surgical History:  Procedure Laterality Date  . CHOLECYSTECTOMY    . DILATION AND CURETTAGE OF UTERUS      OB History    No data available       Home Medications    Prior to Admission medications   Medication Sig Start Date End Date Taking? Authorizing Provider  amLODipine (NORVASC) 10 MG tablet Take 10 mg by mouth at bedtime.     [provider]  Cholecalciferol (VITAMIN D PO) Take 500 mg by mouth daily.    [provider]  DULoxetine (CYMBALTA) 60 MG capsule Take 1 capsule by mouth at bedtime.  08/26/16   [provider]  fluticasone (FLONASE) 50 MCG/ACT nasal spray Place 1 spray into both nostrils daily. 02/11/17   Monico Blitz, MD  HYDROcodone-acetaminophen (NORCO/VICODIN) 5-325 MG tablet Take 1 tablet by mouth every 4 (four) hours as needed. 06/05/17   Isla Pence, MD  insulin aspart (NOVOLOG) 100 UNIT/ML injection Inject 30 Units into the skin daily before lunch.     [provider]  Insulin Aspart Prot & Aspart (NOVOLOG MIX 70/30 FLEXPEN [place]) Inject 90 Units into the skin 3 (three) times daily.     [provider]  levofloxacin (LEVAQUIN) 500 MG tablet Take 1 tablet (500 mg total) by mouth daily. 06/05/17   Isla Pence, MD  magnesium 30 MG tablet Take 30 mg by mouth daily.  [provider]  metroNIDAZOLE (FLAGYL) 500 MG tablet Take 1 tablet (500 mg total) by mouth 2 (two) times daily. 06/05/17   Isla Pence, MD  Multiple Vitamin (MULTIVITAMIN) tablet Take 1 tablet by mouth daily.    [provider]  nadolol (CORGARD) 40 MG tablet Take 40 mg by mouth daily. 09/16/16   [provider]  Omega-3 Fatty Acids (FISH OIL PO) Take 1 capsule by mouth daily.    [provider]  omeprazole (PRILOSEC) 40 MG capsule Take 40 mg by mouth daily. 06/03/17   [provider]  ondansetron (ZOFRAN ODT) 4 MG  disintegrating tablet Take 1 tablet (4 mg total) by mouth every 8 (eight) hours as needed for nausea or vomiting. 06/05/17   Isla Pence, MD  Oxycodone HCl 10 MG TABS Take 10 mg by mouth every 6 (six) hours as needed (pain).    [provider]  potassium chloride SA (K-DUR,KLOR-CON) 20 MEQ tablet Take 20 mEq by mouth 2 (two) times daily.    [provider]  TURMERIC PO Take 400 mg by mouth daily.    [provider]    Family History Family History  Problem Relation Age of Onset  . Diabetes Father   . Hypertension Father     Social History Social History  Substance Use Topics  . Smoking status: Current Every Day Smoker    Packs/day: 1.00    Years: 30.00    Types: Cigarettes  . Smokeless tobacco: Never Used  . Alcohol use No     Allergies   Ace inhibitors; Lisinopril; Tramadol; Iodinated diagnostic agents; Iodine; Janumet [sitagliptin-metformin hcl]; and Victoza [liraglutide]   Review of Systems Review of Systems  Constitutional: Negative for chills and fever.  HENT: Negative for congestion.   Eyes: Negative for visual disturbance.  Respiratory: Negative for cough and shortness of breath.   Cardiovascular: Negative for chest pain.  Gastrointestinal: Positive for abdominal pain and nausea. Negative for diarrhea and vomiting.  Genitourinary: Negative for dysuria, flank pain, frequency, hematuria, urgency, vaginal bleeding and vaginal discharge.  Musculoskeletal: Negative for arthralgias and myalgias.  Skin: Negative for rash.  Neurological: Negative for dizziness, syncope, weakness, light-headedness, numbness and headaches.  Psychiatric/Behavioral: Negative for sleep disturbance. The patient is not nervous/anxious.      Physical Exam Updated Vital Signs BP 131/74 (BP Location: Left Arm)   Pulse 79   Temp 98 F (36.7 C) (Oral)   Resp 20   SpO2 92%   Physical Exam  Constitutional: She is oriented to person, place, and time. She appears  well-developed and well-nourished.  Non-toxic appearance. No distress.  Appears uncomfortable due to pain  HENT:  Head: Normocephalic and atraumatic.  Nose: Nose normal.  Mouth/Throat: Oropharynx is clear and moist.  Eyes: Conjunctivae are normal. Pupils are equal, round, and reactive to light. Right eye exhibits no discharge. Left eye exhibits no discharge.  Neck: Normal range of motion. Neck supple.  Cardiovascular: Normal rate, regular rhythm, normal heart sounds and intact distal pulses.   Pulmonary/Chest: Effort normal and breath sounds normal. No respiratory distress. She exhibits no tenderness.  Abdominal: Soft. Bowel sounds are normal. There is tenderness in the right lower quadrant. There is no rigidity, no rebound, no guarding and no CVA tenderness.  Musculoskeletal: Normal range of motion. She exhibits no tenderness.  Lymphadenopathy:    She has no cervical adenopathy.  Neurological: She is alert and oriented to person, place, and time.  Skin: Skin is warm and dry. Capillary  refill takes less than 2 seconds.  Psychiatric: Her behavior is normal. Judgment and thought content normal.  Nursing note and vitals reviewed.    ED Treatments / Results  Labs (all labs ordered are listed, but only abnormal results are displayed) Labs Reviewed  CBC WITH DIFFERENTIAL/PLATELET - Abnormal; Notable for the following:       Result Value   RBC 3.85 (*)    All other components within normal limits  COMPREHENSIVE METABOLIC PANEL - Abnormal; Notable for the following:    Sodium 133 (*)    Potassium 3.4 (*)    Calcium 8.0 (*)    Total Protein 11.7 (*)    Albumin 2.8 (*)    AST 230 (*)    ALT 266 (*)    Anion gap 3 (*)    All other components within normal limits  URINALYSIS, ROUTINE W REFLEX MICROSCOPIC  MAGNESIUM  PHOSPHORUS    EKG  EKG Interpretation None       Radiology Ct Abdomen Pelvis W Contrast  Result Date: 06/05/2017 CLINICAL DATA:  Abdominal pain and vomiting.  EXAM: CT ABDOMEN AND PELVIS WITH CONTRAST TECHNIQUE: Multidetector CT imaging of the abdomen and pelvis was performed using the standard protocol following bolus administration of intravenous contrast. CONTRAST:  141m ISOVUE-300 IOPAMIDOL (ISOVUE-300) INJECTION 61% COMPARISON:  Radiographs earlier this day.  CT 07/02/2013 FINDINGS: Lower chest: Atelectasis in both lung bases, left greater than right. No consolidation. No pleural fluid. Hepatobiliary: The liver is enlarged spanning 25 cm. There is diffusely decreased density consistent with steatosis. Capsular contours are nodular. No discrete focal lesion. Postcholecystectomy with clips in the gallbladder fossa. Expected postcholecystectomy biliary prominence. Pancreas: No ductal dilatation or inflammation. Spleen: Upper normal in size spanning 13.1 cm craniocaudal. 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 Nodularity of the right adrenal gland without dominant nodule, stable. No hydronephrosis or perinephric edema. Homogeneous renal enhancement and symmetric excretion on delayed phase imaging. Subcentimeter low-density lesions in the right kidney are incompletely characterize due to small size. Urinary bladder is physiologically distended. Stomach/Bowel: Inflamed diverticulum at the junction of the descending and sigmoid colon with associated colonic wall thickening and pericolonic soft tissue edema. Small amount of adjacent free fluid. No perforation or abscess. Additional scattered noninflamed colonic diverticula in the descending and sigmoid colon. Moderate stool in the more proximal colon. Normal appendix. Stomach is physiologically distended. No small bowel dilatation or inflammation. Vascular/Lymphatic: Aortic atherosclerosis without aneurysm. Small retroperitoneal lymph nodes not enlarged by size criteria. Reproductive: Calcified uterine fibroids. Ovaries are quiescent and normal. No adnexal mass. Other:  Small free fluid in the left pericolic gutter, likely reactive. No abdominopelvic ascites. No free air. Tiny fat containing umbilical hernia. Musculoskeletal: Degenerative disc disease and facet arthropathy in the lower thoracic and lumbar spine, degenerative change most prominent at L4-L5. There are no acute or suspicious osseous abnormalities. IMPRESSION: 1. Acute uncomplicated diverticulitis at the junction of the descending and sigmoid colon. No perforation or abscess. 2. Hepatomegaly with hepatic steatosis. Nodular hepatic contours consistent with cirrhosis. Borderline splenomegaly. 3. Mild aortic atherosclerosis. Electronically Signed   By: MJeb LeveringM.D.   On: 06/05/2017 19:22   Dg Abd Acute W/chest  Result Date: 06/05/2017 CLINICAL DATA:  Vomiting for the last 3 weeks. Right lower quadrant pain beginning yesterday. EXAM: DG ABDOMEN ACUTE W/ 1V CHEST COMPARISON:  02/11/2017 FINDINGS: One-view chest shows normal heart size. There is aortic atherosclerosis. There is patchy atelectasis in both lower  lobes. Two view abdominal films do not show evidence of ileus, obstruction or free air. Clips in the right upper quadrant consistent with previous cholecystectomy. Curvature in degenerative change of the spine. 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 IMPRESSION: Atelectasis in both lung bases. No acute intraabdominal finding.  Previous cholecystectomy. Electronically Signed   By: Nelson Chimes M.D.   On: 06/05/2017 12:53    Procedures Procedures (including critical care time)  Medications Ordered in ED Medications  0.9 % NaCl with KCl 20 mEq/ L  infusion (not administered)  sodium chloride 0.9 % bolus 500 mL (0 mLs Intravenous Stopped 06/07/17 0913)  fentaNYL (SUBLIMAZE) injection 50 mcg (50 mcg Intravenous Given 06/07/17 0757)  metoCLOPramide (REGLAN) injection 10 mg (10 mg Intravenous Given 06/07/17 0757)     Initial Impression / Assessment and Plan / ED Course  I have reviewed the triage vital signs  and the nursing notes.  Pertinent labs & imaging results that were available during my care of the patient were reviewed by me and considered in my medical decision making (see chart for details).     Patient resents to the ED with complaints of continued right lower quadrant abdominal pain. She was diagnosed with diverticulitis on 7/1. Was given Cipro and Flagyl and pain medicine for home. Pain medicine has not been helping her pain at home. She reports continued nausea despite clear liquid diet. Denies any emesis. Patient is overall uncomfortable-appearing due to pain and very tearful in the room. Patient is afebrile. No leukocytosis noted. AST and ALT are elevated however history of same. Patient does not have any signs of peritonitis on exam. States the pain is the same as uncontrolled pain medicine. Did not feel that additional imaging is indicated. Patient given pain medicine and nausea medicine the ED with improvement in her pain. Discussed discharge the patient and she feels like if she goes home she is going to be right back in the same amount of pain. I feel the patient being admitted is a reasonable disposition given 2 day trial of pain medicine and antibiotics at home. Discussed with Dr. Olevia Bowens with hospital medicine who agrees to admit patient for observation. Currently patient is hemodynamically stable in no acute distress. Patient was seen by my attending Dr. Laverta Baltimore who is agreeable to the above plan. Final Clinical Impressions(s) / ED Diagnoses   Final diagnoses:  Diverticulitis    New Prescriptions New Prescriptions   No medications on file     Aaron Edelman 06/07/17 [id number]    Margette Fast, MD 06/07/17 [phone]

## 2017-06-08 DIAGNOSIS — F3289 Other specified depressive episodes: Secondary | ICD-10-CM

## 2017-06-08 DIAGNOSIS — R945 Abnormal results of liver function studies: Secondary | ICD-10-CM

## 2017-06-08 DIAGNOSIS — K5792 Diverticulitis of intestine, part unspecified, without perforation or abscess without bleeding: Secondary | ICD-10-CM

## 2017-06-08 LAB — HIV ANTIBODY (ROUTINE TESTING W REFLEX): HIV Screen 4th Generation wRfx: NONREACTIVE

## 2017-06-08 LAB — ECHOCARDIOGRAM COMPLETE
E decel time: 197 msec
EERAT: 14.42
FS: 39 % (ref 28–44)
HEIGHTINCHES: 66 in
IVS/LV PW RATIO, ED: 0.82
LA ID, A-P, ES: 40 mm
LA diam end sys: 40 mm
LA vol A4C: 71 ml
LA vol index: 28.5 mL/m2
LA vol: 66.1 mL
LADIAMINDEX: 1.72 cm/m2
LV e' LATERAL: 8.81 cm/s
LVEEAVG: 14.42
LVEEMED: 14.42
LVOT VTI: 24 cm
LVOT area: 3.46 cm2
LVOT diameter: 21 mm
LVOT peak vel: 99 cm/s
LVOTSV: 83 mL
MV Dec: 197
MV Peak grad: 6 mmHg
MVPKAVEL: 102 m/s
MVPKEVEL: 127 m/s
PW: 12 mm — AB (ref 0.6–1.1)
RV LATERAL S' VELOCITY: 12.3 cm/s
RV TAPSE: 22.3 mm
TDI e' lateral: 8.81
TDI e' medial: 7.83
Weight: 4536.18 oz

## 2017-06-08 LAB — COMPREHENSIVE METABOLIC PANEL
ALBUMIN: 2.7 g/dL — AB (ref 3.5–5.0)
ALK PHOS: 65 U/L (ref 38–126)
ALT: 248 U/L — AB (ref 14–54)
AST: 235 U/L — AB (ref 15–41)
Anion gap: 5 (ref 5–15)
BILIRUBIN TOTAL: 0.8 mg/dL (ref 0.3–1.2)
BUN: 10 mg/dL (ref 6–20)
CALCIUM: 7.9 mg/dL — AB (ref 8.9–10.3)
CO2: 23 mmol/L (ref 22–32)
Chloride: 105 mmol/L (ref 101–111)
Creatinine, Ser: 0.8 mg/dL (ref 0.44–1.00)
GFR calc Af Amer: >60 mL/min (ref >60)
GFR calc non Af Amer: >60 mL/min (ref >60)
GLUCOSE: 95 mg/dL (ref 65–99)
POTASSIUM: 3.5 mmol/L (ref 3.5–5.1)
Sodium: 133 mmol/L — ABNORMAL LOW (ref 135–145)
TOTAL PROTEIN: 11.5 g/dL — AB (ref 6.5–8.1)

## 2017-06-08 LAB — PROTIME-INR
INR: 1.1
Prothrombin Time: 14.2 seconds (ref 11.4–15.2)

## 2017-06-08 LAB — CBC
HEMATOCRIT: 35.6 % — AB (ref 36.0–46.0)
Hemoglobin: 12 g/dL (ref 12.0–15.0)
MCH: 32.3 pg (ref 26.0–34.0)
MCHC: 33.7 g/dL (ref 30.0–36.0)
MCV: 95.7 fL (ref 78.0–100.0)
Platelets: 196 10*3/uL (ref 150–400)
RBC: 3.72 MIL/uL — ABNORMAL LOW (ref 3.87–5.11)
RDW: 14.9 % (ref 11.5–15.5)
WBC: 8.7 10*3/uL (ref 4.0–10.5)

## 2017-06-08 LAB — GLUCOSE, CAPILLARY
GLUCOSE-CAPILLARY: 136 mg/dL — AB (ref 65–99)
GLUCOSE-CAPILLARY: 136 mg/dL — AB (ref 65–99)
Glucose-Capillary: 99 mg/dL (ref 65–99)
Glucose-Capillary: 141 mg/dL — ABNORMAL HIGH (ref 65–99)
Glucose-Capillary: 152 mg/dL — ABNORMAL HIGH (ref 65–99)

## 2017-06-08 MED ORDER — IBUPROFEN 200 MG PO TABS
400.0000 mg | ORAL_TABLET | Freq: Once | ORAL | Status: AC
  Administered 2017-06-08: 400 mg via ORAL
  Filled 2017-06-08: qty 2

## 2017-06-08 MED ORDER — METRONIDAZOLE 500 MG PO TABS
500.0000 mg | ORAL_TABLET | Freq: Three times a day (TID) | ORAL | Status: DC
  Administered 2017-06-08 – 2017-06-09 (×4): 500 mg via ORAL
  Filled 2017-06-08 (×4): qty 1

## 2017-06-08 MED ORDER — PANTOPRAZOLE SODIUM 40 MG PO TBEC
40.0000 mg | DELAYED_RELEASE_TABLET | Freq: Every day | ORAL | Status: DC
  Administered 2017-06-09: 40 mg via ORAL
  Filled 2017-06-08: qty 1

## 2017-06-08 MED ORDER — POTASSIUM CHLORIDE IN NACL 20-0.9 MEQ/L-% IV SOLN
INTRAVENOUS | Status: DC
  Administered 2017-06-08: 18:00:00 via INTRAVENOUS
  Filled 2017-06-08 (×2): qty 1000

## 2017-06-08 NOTE — Progress Notes (Signed)
PHARMACIST - PHYSICIAN COMMUNICATION DR:   TRH CONCERNING: Antibiotic IV to Oral Route Change Policy  RECOMMENDATION: This patient is receiving Metronidazole and pantoprazole by the intravenous route.  Based on criteria approved by the Pharmacy and Therapeutics Committee, the antibiotic(s) is/are being converted to the equivalent oral dose form(s).   DESCRIPTION: These criteria include:  Patient being treated for a respiratory tract infection, urinary tract infection, cellulitis or clostridium difficile associated diarrhea if on metronidazole  The patient is not neutropenic and does not exhibit a GI malabsorption state  The patient is eating (either orally or via tube) and/or has been taking other orally administered medications for a least 24 hours  The patient is improving clinically and has a Tmax < 100.5  Due to national backorder and critical supply currently at [facility] long will change to PO as tolerating other oral meds  If you have questions about this conversion, please contact the Pharmacy Department  _0   [phone] )  [facility] _1   [phone] )  [facility] _2   [phone] )  [facility] _3   [phone] )  [facility] _4   [phone] )  [place], PharmD, BCPS.   Pager: [phone] 06/08/2017 12:43 PM

## 2017-06-08 NOTE — Progress Notes (Signed)
Patient ID: Monica Ellis, female   DOB: 1961/05/19, 56 y.o.   MRN: [id number]  PROGRESS NOTE    Monica Ellis  HUT:[id number] DOB: 12-15-1960 DOA: 06/07/2017  PCP: Brock Ra, PA-C   Brief Narrative:  56 year old female with lupus, AI hepatitis, diabetes, hypertension who presented to ED with abdominal pain in RLQ and associated nausea and poor po intake for 3 days. She was found to have diverticulitis. She was started on cipro and flagyl.    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) - Continue SSI and novolog 70/30 mix 90 units TID - Continue reglan for gastroparesis     Essential hypertension - Continue Norvasc and nadolol     Abnormal LFT's - Due to history of autoimmune hepatitis - NASH findings on CT scan - Appt with GI 7/20    Depression - Continue Cymbalta    Smoking - Nicotine patch ordered  - Counseled on cessation      Hypokalemia - Due to acute diverticulitis and nausea  - Supplemented - Follow up BMP in am    DVT prophylaxis: Heparin subQ Code Status: full code  Family Communication: no family at the bedside this am Disposition Plan: home once diverticulitis resolves    Consultants:   None   Procedures:   None   Antimicrobials:   Cipro and flagyl 7/3 -->    Subjective: Pain in RLQ of the abdomen.   Objective: Vitals:   06/07/17 1441 06/07/17 2103 06/08/17 0552 06/08/17 1047  BP: 130/82 117/86 124/67   Pulse: 73 86 73   Resp: _0 Temp: 99.1 F (37.3 C) 100 F (37.8 C) 98.5 F (36.9 C) 99.8 F (37.7 C)  TempSrc:  Oral Oral Oral Oral  SpO2: 92% 93% 91%   Weight:      Height:        Intake/Output Summary (Last 24 hours) at 06/08/17 1123 Last data filed at 06/08/17 0930  Gross per 24 hour  Intake              840 ml  Output                0 ml  Net              840 ml   Filed Weights   06/07/17 1041  Weight: 128.6 kg (283 lb 8.2 oz)    Examination:  General exam: Appears calm and comfortable  Respiratory system: Clear to auscultation. Respiratory effort normal. Cardiovascular system: S1 & S2 heard, RRR. No JVD, murmurs, rubs, gallops or clicks. No pedal edema. Gastrointestinal system: Abdomen is tender in right lower quadrant, +(BS)  Central nervous system: Alert and oriented. No focal neurological deficits. Extremities: Symmetric 5 x 5 power. Skin: No rashes, lesions or ulcers Psychiatry: Judgement and insight appear normal. Mood & affect appropriate.   Data Reviewed: I have personally reviewed following labs and imaging studies  CBC:  Recent Labs Lab 06/05/17 1151 06/07/17 0754 06/08/17 0442  WBC 10.8* 9.1 8.7  NEUTROABS  --  4.3  --   HGB 13.8 12.4 12.0  HCT  39.2 36.1 35.6*  MCV 92.0 93.8 95.7  PLT 222 202 953   Basic Metabolic Panel:  Recent Labs Lab 06/05/17 1223 06/07/17 0754 06/08/17 0330  NA 130* 133* 133*  K 3.8 3.4* 3.5  CL 103 107 105  CO2 19* 23 23  GLUCOSE 151* 91 95  BUN _0 CREATININE 0.97 0.96 0.80  CALCIUM 8.6* 8.0* 7.9*  MG  --  1.5*  --   PHOS  --  2.5  --    GFR: Estimated Creatinine Clearance: 105.3 mL/min (by C-G formula based on SCr of 0.8 mg/dL). Liver Function Tests:  Recent Labs Lab 06/05/17 1223 06/07/17 0754 06/08/17 0330  AST QUANTITY NOT SUFFICIENT, UNABLE TO PERFORM TEST 230* 235*  ALT 339* 266* 248*  ALKPHOS 63 58 65  BILITOT 1.3* 0.9 0.8  PROT 12.0* 11.7* 11.5*  ALBUMIN 2.9* 2.8* 2.7*    Recent Labs Lab 06/05/17 1223  LIPASE 25   No results for input(s): AMMONIA in the last 168 hours. Coagulation  Profile:  Recent Labs Lab 06/08/17 0330  INR 1.10   Cardiac Enzymes: No results for input(s): CKTOTAL, CKMB, CKMBINDEX, TROPONINI in the last 168 hours. BNP (last 3 results) No results for input(s): PROBNP in the last 8760 hours. HbA1C: No results for input(s): HGBA1C in the last 72 hours. CBG:  Recent Labs Lab 06/07/17 1252 06/07/17 1647 06/07/17 2155 06/08/17 0358 06/08/17 0753  GLUCAP 94 112* 86 99 141*   Lipid Profile: No results for input(s): CHOL, HDL, LDLCALC, TRIG, CHOLHDL, LDLDIRECT in the last 72 hours. Thyroid Function Tests: No results for input(s): TSH, T4TOTAL, FREET4, T3FREE, THYROIDAB in the last 72 hours. Anemia Panel: No results for input(s): VITAMINB12, FOLATE, FERRITIN, TIBC, IRON, RETICCTPCT in the last 72 hours. Urine analysis:    Component Value Date/Time   COLORURINE YELLOW 06/07/2017 0754   APPEARANCEUR CLEAR 06/07/2017 0754   LABSPEC 1.018 06/07/2017 0754   PHURINE 5.0 06/07/2017 0754   GLUCOSEU NEGATIVE 06/07/2017 0754   HGBUR SMALL (A) 06/07/2017 0754   BILIRUBINUR NEGATIVE 06/07/2017 [place] 06/07/2017 0754   PROTEINUR 100 (A) 06/07/2017 0754   NITRITE NEGATIVE 06/07/2017 0754   LEUKOCYTESUR TRACE (A) 06/07/2017 0754   Sepsis Labs: _1 (procalcitonin:4,lacticidven:4)   )No results found for this or any previous visit (from the past 240 hour(s)).    Radiology Studies: Ct Abdomen Pelvis W Contrast  Result Date: 06/05/2017 CLINICAL DATA:  Abdominal pain and vomiting. EXAM: CT ABDOMEN AND PELVIS WITH CONTRAST TECHNIQUE: Multidetector CT imaging of the abdomen and pelvis was performed using the standard protocol following bolus administration of intravenous contrast. CONTRAST:  12m ISOVUE-300 IOPAMIDOL (ISOVUE-300) INJECTION 61% COMPARISON:  Radiographs earlier this day.  CT 07/02/2013 FINDINGS: Lower chest: Atelectasis in both lung bases, left greater than right. No consolidation. No pleural fluid. Hepatobiliary:  The liver is enlarged spanning 25 cm. There is diffusely decreased density consistent with steatosis. Capsular contours are nodular. No discrete focal lesion. Postcholecystectomy with clips in the gallbladder fossa. Expected postcholecystectomy biliary prominence. Pancreas: No ductal dilatation or inflammation. Spleen: Upper normal in size spanning 13.1 cm craniocaudal. 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 Nodularity of the right adrenal gland without dominant nodule, stable. No hydronephrosis or perinephric edema. Homogeneous renal enhancement and symmetric excretion on delayed phase imaging. Subcentimeter low-density lesions in the right kidney are incompletely characterize due to small size. Urinary bladder is physiologically distended. Stomach/Bowel: Inflamed diverticulum at the  junction of the descending and sigmoid colon with associated colonic wall thickening and pericolonic soft tissue edema. Small amount of adjacent free fluid. No perforation or abscess. Additional scattered noninflamed colonic diverticula in the descending and sigmoid colon. Moderate stool in the more proximal colon. Normal appendix. Stomach is physiologically distended. No small bowel dilatation or inflammation. Vascular/Lymphatic: Aortic atherosclerosis without aneurysm. Small retroperitoneal lymph nodes not enlarged by size criteria. Reproductive: Calcified uterine fibroids. Ovaries are quiescent and normal. No adnexal mass. Other: Small free fluid in the left pericolic gutter, likely reactive. No abdominopelvic ascites. No free air. Tiny fat containing umbilical hernia. Musculoskeletal: Degenerative disc disease and facet arthropathy in the lower thoracic and lumbar spine, degenerative change most prominent at L4-L5. There are no acute or suspicious osseous abnormalities. IMPRESSION: 1. Acute uncomplicated diverticulitis at the junction of the descending and sigmoid colon. No  perforation or abscess. 2. Hepatomegaly with hepatic steatosis. Nodular hepatic contours consistent with cirrhosis. Borderline splenomegaly. 3. Mild aortic atherosclerosis. Electronically Signed   By: Jeb Levering M.D.   On: 06/05/2017 19:22   Dg Abd Acute W/chest  Result Date: 06/05/2017 CLINICAL DATA:  Vomiting for the last 3 weeks. Right lower quadrant pain beginning yesterday. EXAM: DG ABDOMEN ACUTE W/ 1V CHEST COMPARISON:  02/11/2017 FINDINGS: One-view chest shows normal heart size. There is aortic atherosclerosis. There is patchy atelectasis in both lower lobes. Two view abdominal films do not show evidence of ileus, obstruction or free air. Clips in the right upper quadrant consistent with previous cholecystectomy. Curvature in degenerative change of the spine. 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 IMPRESSION: Atelectasis in both lung bases. No acute intraabdominal finding.  Previous cholecystectomy. Electronically Signed   By: Nelson Chimes M.D.   On: 06/05/2017 12:53        Scheduled Meds: . amLODipine  10 mg Oral QHS  . DULoxetine  60 mg Oral QHS  . fluticasone  1 spray Each Nare Daily  . heparin  5,000 Units Subcutaneous Q8H  . insulin aspart  0-15 Units Subcutaneous TID WC  . insulin aspart  0-5 Units Subcutaneous QHS  . insulin aspart protamine- aspart  90 Units Subcutaneous TID  . metoCLOPramide (REGLAN) injection  5 mg Intravenous Q6H  . nadolol  40 mg Oral QHS  . nicotine  21 mg Transdermal Daily  . pantoprazole (PROTONIX) IV  40 mg Intravenous Q24H  . ursodiol  300 mg Oral TID   Continuous Infusions: . ciprofloxacin 400 mg (06/08/17 1020)  . metronidazole 500 mg (06/08/17 1020)     LOS: 1 day    Time spent: 25 minutes  Greater than 50% of the time spent on counseling and coordinating the care.   Leisa Lenz, MD [facility] Pager [phone]  If 7PM-7AM, please contact night-coverage [email or web address] Password [email or web address] 06/08/2017, 11:23 AM

## 2017-06-08 NOTE — Progress Notes (Signed)
Nutrition Brief Note  Patient identified on the Malnutrition Screening Tool (MST) Report  Patient's weight currently fluctuating between 270-290 lb since November 2017. Pt tolerating full liquid so far.  Wt Readings from Last 15 Encounters:  06/07/17 283 lb 8.2 oz (128.6 kg)  06/05/17 270 lb (122.5 kg)  10/13/16 290 lb (131.5 kg)  09/02/16 290 lb (131.5 kg)  08/31/16 290 lb (131.5 kg)  12/29/15 274 lb 12.8 oz (124.6 kg)  07/01/15 265 lb (120.2 kg)    Body mass index is 45.76 kg/m. Patient meets criteria for morbid obesity based on current BMI.   Current diet order is full liquids, patient is consuming approximately 100% of meals at this time. Labs and medications reviewed.   No nutrition interventions warranted at this time. If nutrition issues arise, please consult RD.   Clayton Bibles, MS, RD, LDN Pager: [phone] After Hours Pager: [phone]

## 2017-06-09 DIAGNOSIS — E876 Hypokalemia: Secondary | ICD-10-CM

## 2017-06-09 LAB — CBC
HCT: 36.2 % (ref 36.0–46.0)
Hemoglobin: 12.2 g/dL (ref 12.0–15.0)
MCH: 32.3 pg (ref 26.0–34.0)
MCHC: 33.7 g/dL (ref 30.0–36.0)
MCV: 95.8 fL (ref 78.0–100.0)
PLATELETS: 200 10*3/uL (ref 150–400)
RBC: 3.78 MIL/uL — ABNORMAL LOW (ref 3.87–5.11)
RDW: 14.7 % (ref 11.5–15.5)
WBC: 7.9 10*3/uL (ref 4.0–10.5)

## 2017-06-09 LAB — GLUCOSE, CAPILLARY
GLUCOSE-CAPILLARY: 183 mg/dL — AB (ref 65–99)
GLUCOSE-CAPILLARY: 199 mg/dL — AB (ref 65–99)
Glucose-Capillary: 195 mg/dL — ABNORMAL HIGH (ref 65–99)

## 2017-06-09 LAB — COMPREHENSIVE METABOLIC PANEL
ALK PHOS: 58 U/L (ref 38–126)
ALT: 213 U/L — AB (ref 14–54)
AST: 201 U/L — AB (ref 15–41)
Albumin: 2.6 g/dL — ABNORMAL LOW (ref 3.5–5.0)
Anion gap: 5 (ref 5–15)
BILIRUBIN TOTAL: 1.5 mg/dL — AB (ref 0.3–1.2)
BUN: 6 mg/dL (ref 6–20)
CALCIUM: 8.2 mg/dL — AB (ref 8.9–10.3)
CHLORIDE: 100 mmol/L — AB (ref 101–111)
CO2: 22 mmol/L (ref 22–32)
CREATININE: 0.81 mg/dL (ref 0.44–1.00)
Glucose, Bld: 188 mg/dL — ABNORMAL HIGH (ref 65–99)
Potassium: 4 mmol/L (ref 3.5–5.1)
Sodium: 127 mmol/L — ABNORMAL LOW (ref 135–145)
Total Protein: 11.2 g/dL — ABNORMAL HIGH (ref 6.5–8.1)

## 2017-06-09 MED ORDER — ACETAMINOPHEN 325 MG PO TABS
650.0000 mg | ORAL_TABLET | ORAL | Status: DC | PRN
  Administered 2017-06-09: 650 mg via ORAL
  Filled 2017-06-09: qty 2

## 2017-06-09 MED ORDER — HYDROCODONE-ACETAMINOPHEN 5-325 MG PO TABS: 1.0000 | ORAL_TABLET | ORAL | 0 refills | Status: DC | PRN

## 2017-06-09 MED ORDER — CIPROFLOXACIN HCL 500 MG PO TABS: 500.0000 mg | ORAL_TABLET | Freq: Two times a day (BID) | ORAL | 0 refills | Status: AC

## 2017-06-09 MED ORDER — METRONIDAZOLE 500 MG PO TABS: 500.0000 mg | ORAL_TABLET | Freq: Three times a day (TID) | ORAL | 0 refills | Status: AC

## 2017-06-09 MED ORDER — ACETAMINOPHEN 325 MG PO TABS: 650.0000 mg | ORAL_TABLET | ORAL | 0 refills | Status: DC | PRN

## 2017-06-09 MED ORDER — URSODIOL 300 MG PO CAPS: 300.0000 mg | ORAL_CAPSULE | Freq: Three times a day (TID) | ORAL | 0 refills | Status: DC

## 2017-06-09 NOTE — Discharge Instructions (Signed)
Diverticulitis Diverticulitis is inflammation or infection of small pouches in your colon that form when you have a condition called diverticulosis. The pouches in your colon are called diverticula. Your colon, or large intestine, is where water is absorbed and stool is formed. Complications of diverticulitis can include:  Bleeding.  Severe infection.  Severe pain.  Perforation of your colon.  Obstruction of your colon.  What are the causes? Diverticulitis is caused by bacteria. Diverticulitis happens when stool becomes trapped in diverticula. This allows bacteria to grow in the diverticula, which can lead to inflammation and infection. What increases the risk? People with diverticulosis are at risk for diverticulitis. Eating a diet that does not include enough fiber from fruits and vegetables may make diverticulitis more likely to develop. What are the signs or symptoms? Symptoms of diverticulitis may include:  Abdominal pain and tenderness. The pain is normally located on the left side of the abdomen, but may occur in other areas.  Fever and chills.  Bloating.  Cramping.  Nausea.  Vomiting.  Constipation.  Diarrhea.  Blood in your stool.  How is this diagnosed? Your health care provider will ask you about your medical history and do a physical exam. You may need to have tests done because many medical conditions can cause the same symptoms as diverticulitis. Tests may include:  Blood tests.  Urine tests.  Imaging tests of the abdomen, including X-rays and CT scans.  When your condition is under control, your health care provider may recommend that you have a colonoscopy. A colonoscopy can show how severe your diverticula are and whether something else is causing your symptoms. How is this treated? Most cases of diverticulitis are mild and can be treated at home. Treatment may include:  Taking over-the-counter pain medicines.  Following a clear liquid  diet.  Taking antibiotic medicines by mouth for 7-10 days.  More severe cases may be treated at a hospital. Treatment may include:  Not eating or drinking.  Taking prescription pain medicine.  Receiving antibiotic medicines through an IV tube.  Receiving fluids and nutrition through an IV tube.  Surgery.  Follow these instructions at home:  Follow your health care providers instructions carefully.  Follow a full liquid diet or other diet as directed by your health care provider. After your symptoms improve, your health care provider may tell you to change your diet. He or she may recommend you eat a high-fiber diet. Fruits and vegetables are good sources of fiber. Fiber makes it easier to pass stool.  Take fiber supplements or probiotics as directed by your health care provider.  Only take medicines as directed by your health care provider.  Keep all your follow-up appointments. Contact a health care provider if:  Your pain does not improve.  You have a hard time eating food.  Your bowel movements do not return to normal. Get help right away if:  Your pain becomes worse.  Your symptoms do not get better.  Your symptoms suddenly get worse.  You have a fever.  You have repeated vomiting.  You have bloody or black, tarry stools. This information is not intended to replace advice given to you by your health care provider. Make sure you discuss any questions you have with your health care provider. Document Released: 09/01/2005 Document Revised: 04/29/2016 Document Reviewed: 10/17/2013 Elsevier Interactive Patient Education  2017 [employer].

## 2017-06-09 NOTE — Discharge Summary (Signed)
Physician Discharge Summary  Monica Ellis OMB:[id number] DOB: [birth year] DOA: 06/07/2017  PCP: Brock Ra, PA-C  Admit date: 06/07/2017 Discharge date: 06/09/2017  Recommendations for Outpatient Follow-up:  Continue Cipro and flagyl for 10 days on discharge. Follow up with PCP in 1 week to make sure symptoms are stable. Follow up sodium level in about few days on discharge.  Discharge Diagnoses:  Principal Problem:   Acute diverticulitis Active Problems:   Uncontrolled type 2 diabetes mellitus (HCC)   Lupus   Depression   Abnormal LFTs   Hypokalemia   Hypomagnesemia   Tobacco use disorder   Atelectasis of both lungs    Discharge Condition: pt wants to go home today, spoke to pt about following with pcp sodium level in few days  Diet recommendation: as tolerated   History of present illness:  56 year old female with lupus, AI hepatitis, diabetes, hypertension who presented to ED with abdominal pain in RLQ and associated nausea and poor po intake for 3 days. She was found to have diverticulitis. She was started on cipro and flagyl.   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 ([place]) - Continue insulin regimen per home dose     Essential hypertension - Continue home meds     Abnormal LFT's - Due to history of autoimmune hepatitis - Follow up with GI per sch appt     Depression - Continue Cymbalta     Smoking - Counseled on cessation - Nicotine patch in hospital      Hypokalemia - Due to GI losses  -  Supplemented     DVT prophylaxis: Hep subQ Code Status: full code  Family Communication: family not at the bedside this am   Consultants:   None   Procedures:   None   Antimicrobials:   Cipro and flagyl 7/3 --> for 10 days on discharge     Signed:  Leisa Lenz, MD  [facility] 06/09/2017, 10:34 AM  Pager #: [phone]  Time spent in minutes: more than 30 minutes    Discharge Exam: Vitals:   06/08/17 2014 06/09/17 0410  BP: 139/82 (!) 149/87  Pulse: 84 83  Resp: 16 18  Temp: 98.9 F (37.2 C) 99.2 F (37.3 C)   Vitals:   06/08/17 1047 06/08/17 1445 06/08/17 2014 06/09/17 0410  BP:  113/63 139/82 (!) 149/87  Pulse:  79 84 83  Resp:  _0 Temp: 99.8 F (37.7 C) 99.1 F (37.3 C) 98.9 F (37.2 C) 99.2 F (37.3 C)  TempSrc: Oral Oral Oral Oral  SpO2:  95% 90% 90%  Weight:      Height:        General: Pt is alert, follows commands appropriately, not in acute distress Cardiovascular: Regular rate and rhythm, S1/S2 + Respiratory: Clear to auscultation bilaterally, no wheezing, no crackles, no rhonchi Abdominal: Soft, non tender, non distended, bowel sounds +, no guarding Extremities: no cyanosis, pulses palpable bilaterally DP and PT Neuro: Grossly nonfocal  Discharge Instructions  Discharge Instructions    Call MD for:  persistant nausea and vomiting    Complete by:  As directed    Call MD for:  redness, tenderness, or signs of infection (pain, swelling, redness, odor or green/yellow discharge around incision site)    Complete by:  As directed    Call MD for:  severe uncontrolled pain    Complete by:  As directed    Diet - low sodium heart healthy    Complete by:  As directed    Discharge instructions    Complete by:  As directed    Continue Cipro and flagyl for 10 days on discharge. Follow up with PCP in 1 week to make sure symptoms are stable.   Increase activity slowly    Complete by:  As directed      Allergies as of  06/09/2017      Reactions   Ace Inhibitors Swelling   Lisinopril Swelling, Other (See Comments)   Other reaction(s): Angioedema (ALLERGY/intolerance), Face   Tramadol Other (See Comments)   Other reaction(s): Mental Status Changes (intolerance)   Iodinated Diagnostic Agents Swelling   When patient was in her 20's, swelled up all over after getting getting injection of contrast; did not have any other symptoms/ was given benadryl after that happened and had no further problems per patients/   Iodine Swelling   Janumet [sitagliptin-metformin Hcl] Other (See Comments)   Continuous yeast infection   Victoza [liraglutide] Nausea And Vomiting      Medication List    STOP taking these medications   levofloxacin 500 MG tablet Commonly known as:  LEVAQUIN     TAKE these medications   acetaminophen 325 MG tablet Commonly known as:  TYLENOL Take 2 tablets (650 mg total) by mouth every 4 (four) hours as needed for mild pain, fever or headache.   amLODipine 10 MG tablet Commonly known as:  NORVASC Take 10 mg by mouth at bedtime.   ciprofloxacin 500 MG tablet Commonly known as:  CIPRO Take 1 tablet (500 mg total) by mouth 2 (two) times daily.   DULoxetine 60 MG capsule Commonly known as:  CYMBALTA Take 1 capsule by mouth at bedtime.   FISH OIL PO Take 1 capsule by mouth daily.   fluticasone 50 MCG/ACT nasal spray Commonly known as:  FLONASE Place 1 spray into both nostrils daily.   HYDROcodone-acetaminophen 5-325 MG tablet Commonly known as:  NORCO/VICODIN Take 1 tablet by mouth every 4 (four) hours as needed.   insulin aspart 100 UNIT/ML injection Commonly known as:  novoLOG Inject 30 Units into the skin daily before lunch.   magnesium 30 MG tablet Take 30 mg by mouth daily.   metroNIDAZOLE 500 MG tablet Commonly known as:  FLAGYL Take 1 tablet (500 mg total) by mouth 3 (three) times daily. What changed:  when to take this   multivitamin tablet Take 1 tablet by mouth  daily.   nadolol 40 MG tablet Commonly known as:  CORGARD Take 40 mg by mouth daily.   NOVOLOG MIX 70/30 FLEXPEN [place] Inject 90 Units into the skin 3 (three) times daily.   omeprazole 40 MG capsule Commonly known as:  PRILOSEC Take 40 mg by mouth daily.   ondansetron 4 MG disintegrating tablet Commonly known as:  ZOFRAN ODT Take 1 tablet (4 mg total) by mouth every 8 (eight) hours as needed for nausea or vomiting.   Oxycodone HCl 10 MG Tabs Take 10 mg by mouth every 6 (six) hours  as needed (pain).   potassium chloride SA 20 MEQ tablet Commonly known as:  K-DUR,KLOR-CON Take 20 mEq by mouth 2 (two) times daily.   TURMERIC PO Take 400 mg by mouth daily.   ursodiol 300 MG capsule Commonly known as:  ACTIGALL Take 1 capsule (300 mg total) by mouth 3 (three) times daily.   VITAMIN D PO Take 500 mg by mouth daily.      Follow-up Information    Brock Ra, PA-C. Schedule an appointment as soon as possible for a visit in 1 week(s).   Contact information: [address] [place] [address] [phone]            The results of significant diagnostics from this hospitalization (including imaging, microbiology, ancillary and laboratory) are listed below for reference.    Significant Diagnostic Studies: Ct Abdomen Pelvis W Contrast  Result Date: 06/05/2017 CLINICAL DATA:  Abdominal pain and vomiting. EXAM: CT ABDOMEN AND PELVIS WITH CONTRAST TECHNIQUE: Multidetector CT imaging of the abdomen and pelvis was performed using the standard protocol following bolus administration of intravenous contrast. CONTRAST:  150m ISOVUE-300 IOPAMIDOL (ISOVUE-300) INJECTION 61% COMPARISON:  Radiographs earlier this day.  CT 07/02/2013 FINDINGS: Lower chest: Atelectasis in both lung bases, left greater than right. No consolidation. No pleural fluid. Hepatobiliary: The liver is enlarged spanning 25 cm. There is diffusely decreased density consistent with steatosis. Capsular contours  are nodular. No discrete focal lesion. Postcholecystectomy with clips in the gallbladder fossa. Expected postcholecystectomy biliary prominence. Pancreas: No ductal dilatation or inflammation. Spleen: Upper normal in size spanning 13.1 cm craniocaudal. 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 Nodularity of the right adrenal gland without dominant nodule, stable. No hydronephrosis or perinephric edema. Homogeneous renal enhancement and symmetric excretion on delayed phase imaging. Subcentimeter low-density lesions in the right kidney are incompletely characterize due to small size. Urinary bladder is physiologically distended. Stomach/Bowel: Inflamed diverticulum at the junction of the descending and sigmoid colon with associated colonic wall thickening and pericolonic soft tissue edema. Small amount of adjacent free fluid. No perforation or abscess. Additional scattered noninflamed colonic diverticula in the descending and sigmoid colon. Moderate stool in the more proximal colon. Normal appendix. Stomach is physiologically distended. No small bowel dilatation or inflammation. Vascular/Lymphatic: Aortic atherosclerosis without aneurysm. Small retroperitoneal lymph nodes not enlarged by size criteria. Reproductive: Calcified uterine fibroids. Ovaries are quiescent and normal. No adnexal mass. Other: Small free fluid in the left pericolic gutter, likely reactive. No abdominopelvic ascites. No free air. Tiny fat containing umbilical hernia. Musculoskeletal: Degenerative disc disease and facet arthropathy in the lower thoracic and lumbar spine, degenerative change most prominent at L4-L5. There are no acute or suspicious osseous abnormalities. IMPRESSION: 1. Acute uncomplicated diverticulitis at the junction of the descending and sigmoid colon. No perforation or abscess. 2. Hepatomegaly with hepatic steatosis. Nodular hepatic contours consistent with cirrhosis.  Borderline splenomegaly. 3. Mild aortic atherosclerosis. Electronically Signed   By: MJeb LeveringM.D.   On: 06/05/2017 19:22   Dg Abd Acute W/chest  Result Date: 06/05/2017 CLINICAL DATA:  Vomiting for the last 3 weeks. Right lower quadrant pain beginning yesterday. EXAM: DG ABDOMEN ACUTE W/ 1V CHEST COMPARISON:  02/11/2017 FINDINGS: One-view chest shows normal heart size. There is aortic atherosclerosis. There is patchy atelectasis in both lower lobes. Two view abdominal films do not show evidence of ileus, obstruction or free air. Clips in the right upper quadrant consistent with previous cholecystectomy. Curvature in degenerative  change of the spine. 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 IMPRESSION: Atelectasis in both lung bases. No acute intraabdominal finding.  Previous cholecystectomy. Electronically Signed   By: Nelson Chimes M.D.   On: 06/05/2017 12:53    Microbiology: No results found for this or any previous visit (from the past 240 hour(s)).   Labs: Basic Metabolic Panel:  Recent Labs Lab 06/05/17 1223 06/07/17 0754 06/08/17 0330 06/09/17 0332  NA 130* 133* 133* 127*  K 3.8 3.4* 3.5 4.0  CL 103 107 105 100*  CO2 19* _0 GLUCOSE 151* 91 95 188*  BUN _1 CREATININE 0.97 0.96 0.80 0.81  CALCIUM 8.6* 8.0* 7.9* 8.2*  MG  --  1.5*  --   --   PHOS  --  2.5  --   --    Liver Function Tests:  Recent Labs Lab 06/05/17 1223 06/07/17 0754 06/08/17 0330 06/09/17 0332  AST QUANTITY NOT SUFFICIENT, UNABLE TO PERFORM TEST 230* 235* 201*  ALT 339* 266* 248* 213*  ALKPHOS 63 58 65 58  BILITOT 1.3* 0.9 0.8 1.5*  PROT 12.0* 11.7* 11.5* 11.2*  ALBUMIN 2.9* 2.8* 2.7* 2.6*    Recent Labs Lab 06/05/17 1223  LIPASE 25   No results for input(s): AMMONIA in the last 168 hours. CBC:  Recent Labs Lab 06/05/17 1151 06/07/17 0754 06/08/17 0442 06/09/17 0332  WBC 10.8* 9.1 8.7 7.9  NEUTROABS  --  4.3  --   --   HGB 13.8 12.4 12.0 12.2  HCT 39.2 36.1 35.6* 36.2   MCV 92.0 93.8 95.7 95.8  PLT 222 202 196 200   Cardiac Enzymes: No results for input(s): CKTOTAL, CKMB, CKMBINDEX, TROPONINI in the last 168 hours. BNP: BNP (last 3 results)  Recent Labs  02/11/17 1558  BNP 23.8    ProBNP (last 3 results) No results for input(s): PROBNP in the last 8760 hours.  CBG:  Recent Labs Lab 06/08/17 1148 06/08/17 1729 06/08/17 2143 06/09/17 0639 06/09/17 0755  GLUCAP 136* 136* 152* 183* 199*

## 2017-06-09 NOTE — Progress Notes (Signed)
Pt tolerated lunch. Denies pain. Discharged home in stable condition. Discharge instructions and scripts given. Pt verbalized understanding. No immediate questions or concerns. Discharged via wheelchair

## 2017-09-03 IMAGING — US US ABDOMEN COMPLETE
1 series · 13 of 25 positions shown · non-contrast
Comparison: None; correlation CT abdomen 07/02/2013

CLINICAL DATA: Abdominal pain, elevated LFTs, diabetes mellitus,
hypertension, autoimmune hepatitis, lupus, cholecystectomy

EXAM:
ABDOMEN ULTRASOUND COMPLETE

[Series 1: us abdomen complete · 0.33mm/px · 13 of 62 slices shown]
[im 1/62]
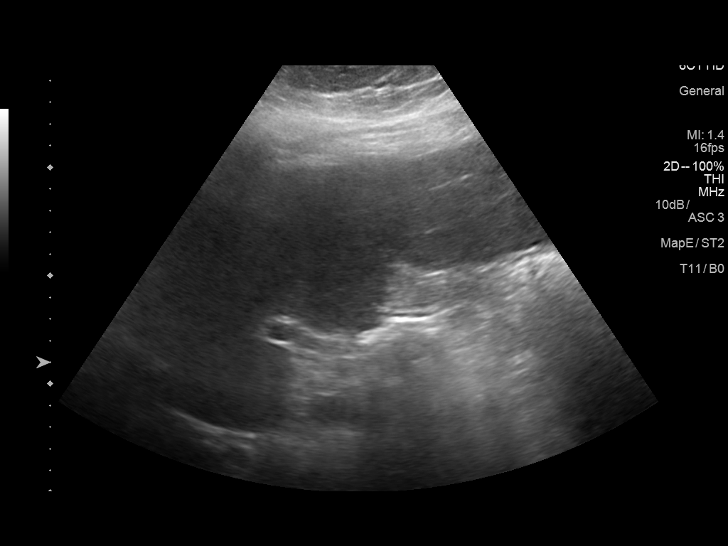
[im 6/62]
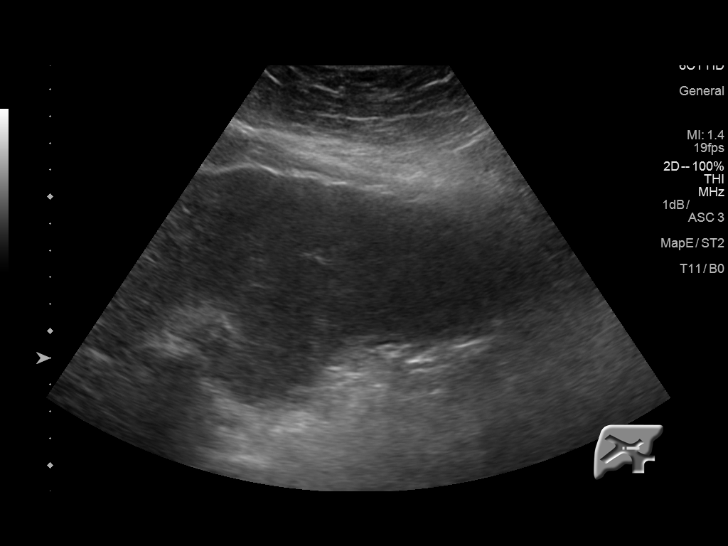
[im 11/62]
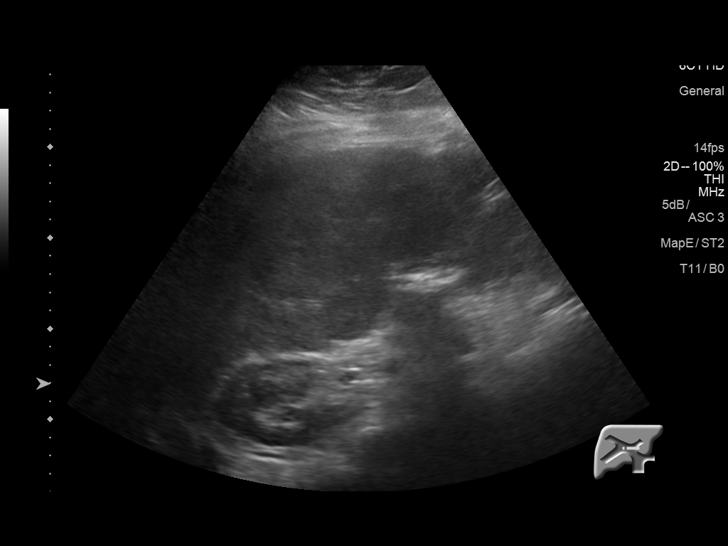
[im 16/62]
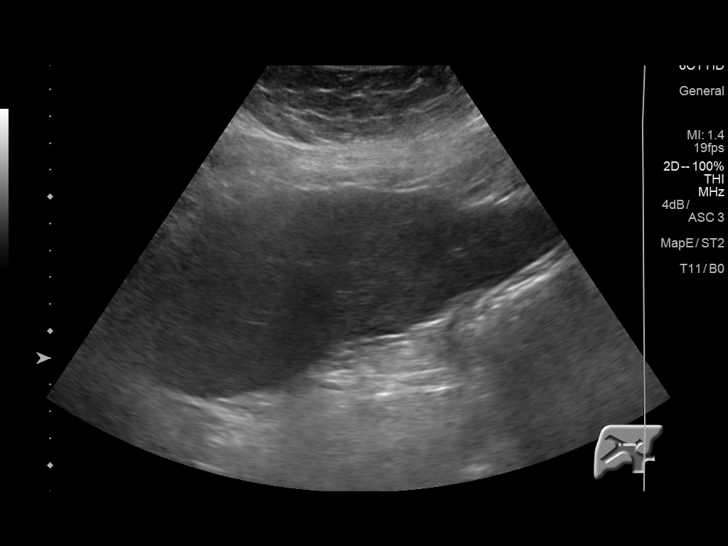
[im 21/62]
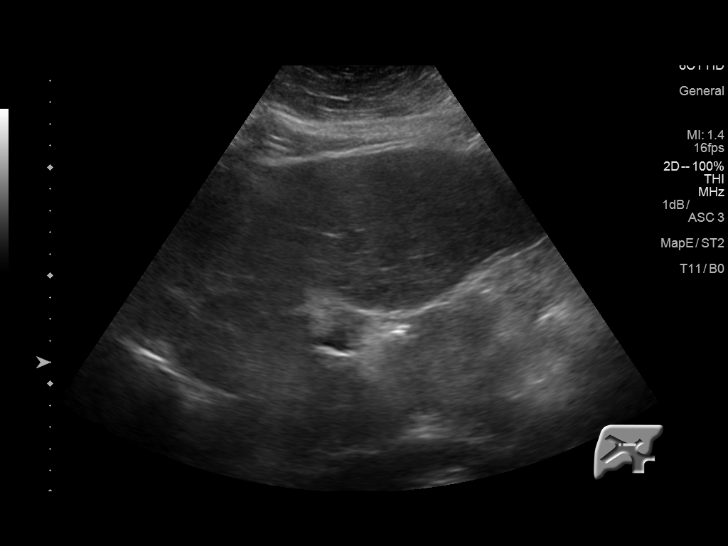
[im 26/62]
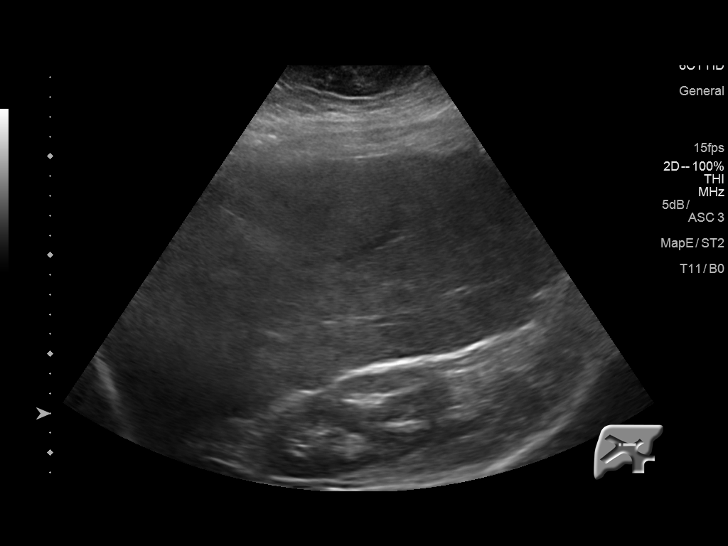
[im 31/62]
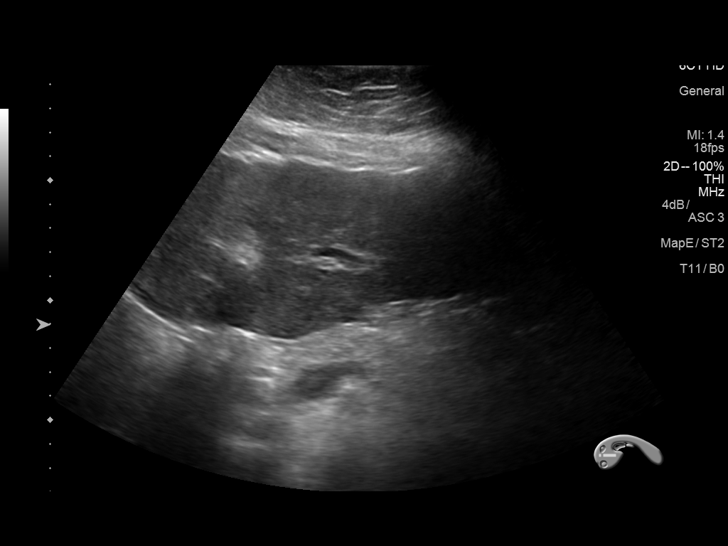
[im 36/62]
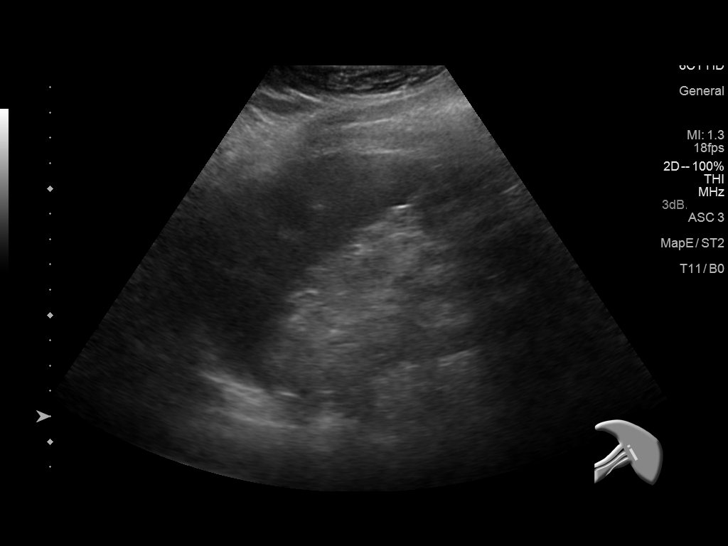
[im 41/62]
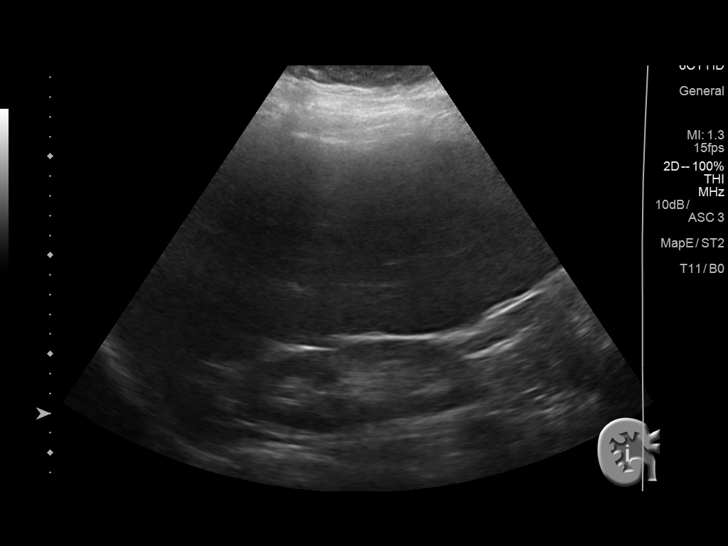
[im 46/62]
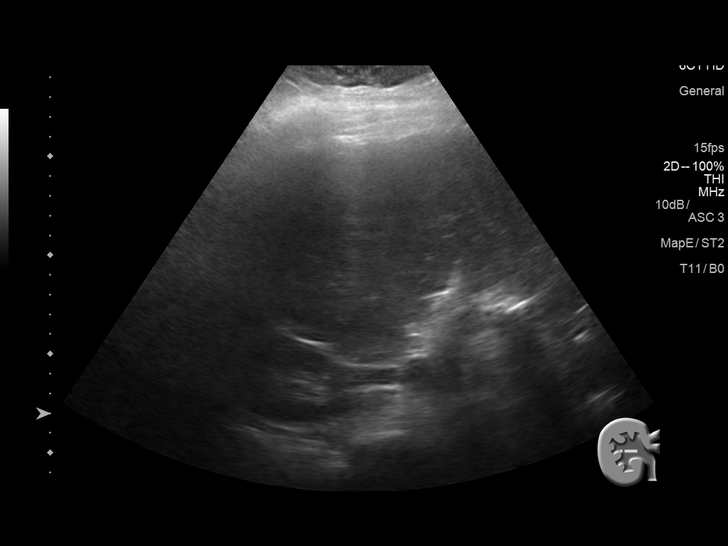
[im 51/62]
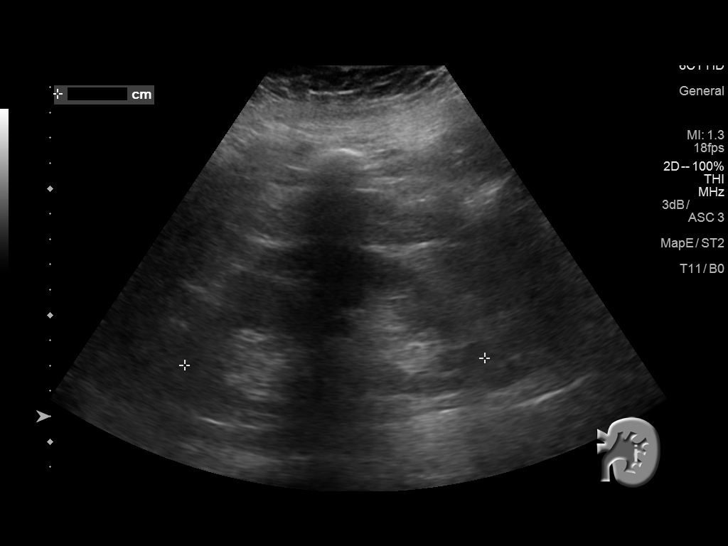
[im 56/62]
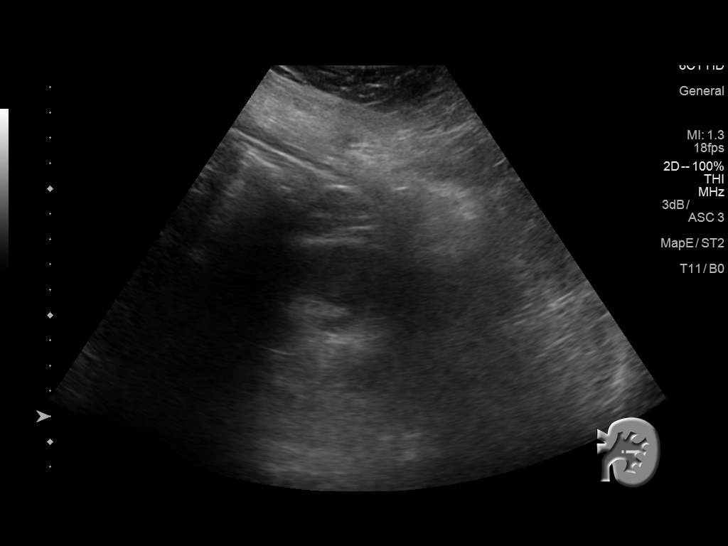
[im 62/62]
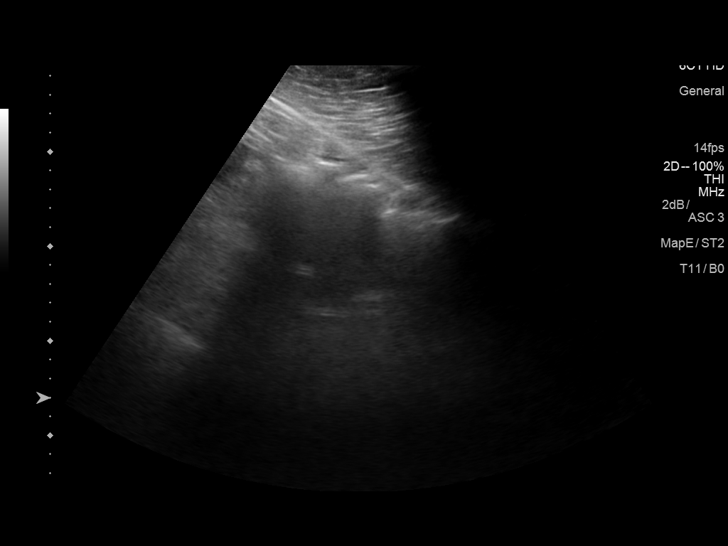

[13 of 25 positions shown; findings below may reference images not displayed]

FINDINGS: Gallbladder: Surgically absent

Common bile duct: Diameter: 4 mm diameter , normal

Liver: Grossly normal echogenicity. Anterior LEFT lobe hepatic
margin appears nodular likely representing cirrhosis. Hepatopetal
portal venous flow. No gross evidence of hepatic mass identified
though assessment of intrahepatic detail is limited secondary to
body habitus and poor sound penetration.

IVC: Grossly normal appearance

Pancreas: Distal tail obscured by bowel gas, visualized portion
normal appearance

Spleen: Normal appearance, 8.3 cm length

Right Kidney: Length: 11.5 cm. Normal morphology without mass or
hydronephrosis.

Left Kidney: Length: 11.9 cm. Normal morphology without mass or
hydronephrosis.

Abdominal aorta: Normal caliber

Other findings: No free fluid
IMPRESSION: Cirrhotic appearing liver without gross evidence of hepatic mass
though intrahepatic detail is limited due to body habitus and poor
sound transmission; if better intrahepatic visualization is
required, recommend CT or MR imaging with contrast.

Incomplete pancreatic tail visualization.

Post cholecystectomy.

## 2017-10-07 ENCOUNTER — Telehealth: Payer: Self-pay | Admitting: *Deleted

## 2017-10-07 NOTE — Telephone Encounter (Signed)
[place].  She has a new pt. appt. with Dr. Felecia Shelling on Monday 10/10/17.  It looks like this appt. is for pain management.  Unfortunately, we are not a pain management clinic, and Dr. Felecia Shelling is not able to accept new patients for pain management.  We do not have the ability to do epidural steroid injections in this office, and are not able to take over management of opioid prescriptions.  I will leave her appt. on the schedule for now, and try to reach her again, but if she calls back, and her appt. was for pain management, she may cancel it.Juanetta Beets

## 2017-10-10 ENCOUNTER — Ambulatory Visit: Payer: Self-pay | Admitting: Neurology

## 2017-11-18 DIAGNOSIS — K219 Gastro-esophageal reflux disease without esophagitis: Secondary | ICD-10-CM | POA: Insufficient documentation

## 2017-11-18 DIAGNOSIS — M5136 Other intervertebral disc degeneration, lumbar region: Secondary | ICD-10-CM | POA: Insufficient documentation

## 2017-11-18 DIAGNOSIS — Z6841 Body Mass Index (BMI) 40.0 and over, adult: Secondary | ICD-10-CM

## 2017-11-18 DIAGNOSIS — E785 Hyperlipidemia, unspecified: Secondary | ICD-10-CM | POA: Insufficient documentation

## 2017-11-18 DIAGNOSIS — K754 Autoimmune hepatitis: Secondary | ICD-10-CM | POA: Insufficient documentation

## 2017-11-18 DIAGNOSIS — M5030 Other cervical disc degeneration, unspecified cervical region: Secondary | ICD-10-CM | POA: Insufficient documentation

## 2017-11-18 DIAGNOSIS — Z8659 Personal history of other mental and behavioral disorders: Secondary | ICD-10-CM | POA: Insufficient documentation

## 2017-11-18 DIAGNOSIS — E559 Vitamin D deficiency, unspecified: Secondary | ICD-10-CM | POA: Insufficient documentation

## 2017-11-18 DIAGNOSIS — I10 Essential (primary) hypertension: Secondary | ICD-10-CM | POA: Insufficient documentation

## 2017-11-18 DIAGNOSIS — Z8669 Personal history of other diseases of the nervous system and sense organs: Secondary | ICD-10-CM | POA: Insufficient documentation

## 2017-11-18 DIAGNOSIS — Z86018 Personal history of other benign neoplasm: Secondary | ICD-10-CM | POA: Insufficient documentation

## 2017-11-18 NOTE — Progress Notes (Signed)
Office Visit Note  Patient: Monica Ellis             Date of Birth: September 08, 1961           MRN: [id number]             PCP: Ferd Hibbs, NP Referring: Brock Ra, PA-C Visit Date: 11/23/2017 Occupation: _0 @    Subjective:  Joint pain   History of Present Illness: Monica Ellis is a 56 y.o. female seen in consultation per request of her PCP for evaluation of positive ANA. Patient has known history of autoimmune hepatitis for several years. According to patient in 2012 while she was living in [place] she went to emergency room with flulike symptoms. At the time her LFTs were elevated and she was hospitalized. She states she had no insurance so she could not follow up with a liver specialist for a while. She was going to [place] a free clinic where she had liver biopsy and was told that she had "liver disease". She was referred to [facility] hepatologist who after evaluation started on high-dose prednisone. Patient recalls taking prednisone 50 mg twice a day she states she was never given any DMARD's. She moved to [place] in 2013. She was going to the [facility] hepatologist. Were she continued on prednisone 50 mg twice a day. She states the doctor moved after 6 months and she could not see the doctor anymore. She started seeing gastroenterologist at [facility] and was on prednisone taper. She came down to prednisone 20 mg a day. She was tried on azathioprine but could not tolerate it due to vomiting. They she was tried on 6-MP and budesonide which she discontinued due to side effects. She stayed on prednisone 10 mg twice a day. She has not seen the gastroenterologist in one year due to insurance issues. She's been getting prednisone prescription from her PCP. She states she's had problems with multiple joints over the years. She complains of pain in her bilateral knee joints, bilateral shoulder joints her C-spine and lumbar spine. 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 She was getting pain management with a neurologist. Now she switched her pain clinic to any doctor. She gives history of swelling in her ankles at times. She denies any history of Raynaud's phenomena and for sensitivity lymphadenopathy. She does have some sicca symptoms.   Activities of Daily Living:  Patient reports morning stiffness for all day hours.   Patient Reports nocturnal pain.  Difficulty dressing/grooming: Reports Difficulty climbing stairs: Reports Difficulty getting out of chair: Reports Difficulty using hands for taps, buttons, cutlery, and/or writing: Denies   Review of Systems  Constitutional: Positive for fatigue. Negative for night sweats, weight gain, weight loss and weakness.  HENT: Positive for mouth dryness. Negative for mouth sores, trouble swallowing, trouble swallowing and nose dryness.   Eyes: Positive for dryness. Negative for pain, redness and visual disturbance.  Respiratory: Negative for cough, shortness of breath and difficulty breathing.   Cardiovascular: Negative for chest pain, palpitations, hypertension, irregular heartbeat and swelling in legs/feet.  Gastrointestinal: Negative for blood in stool, constipation and diarrhea.  Endocrine: Negative for increased urination.  Genitourinary: Negative for vaginal dryness.  Musculoskeletal: Positive for arthralgias, joint pain, joint swelling, myalgias, morning stiffness and myalgias. Negative for muscle weakness and muscle tenderness.  Skin: Negative for color change, rash, hair loss, skin tightness, ulcers and sensitivity to sunlight.  Allergic/Immunologic: Negative for susceptible to infections.  Neurological: Negative for dizziness, memory loss and night sweats.  Hematological: Negative for swollen glands.  Psychiatric/Behavioral: Positive for depressed mood. Negative for sleep disturbance. The patient is not nervous/anxious.     PMFS History:    Patient Active Problem List   Diagnosis Date Noted  . Autoimmune hepatitis ([place]) 11/18/2017  . Essential hypertension 11/18/2017  . Class 3 severe obesity due to excess calories with serious comorbidity and body mass index (BMI) of 40.0 to 44.9 in adult ([place]) 11/18/2017  . History of depression 11/18/2017  . Gastroesophageal reflux disease without esophagitis 11/18/2017  . Dyslipidemia 11/18/2017  . Vitamin D deficiency 11/18/2017  . History of migraine 11/18/2017  . History of meningioma 11/18/2017  . DDD (degenerative disc disease), cervical 11/18/2017  . DDD (degenerative disc disease), lumbar 11/18/2017  . Acute diverticulitis 06/07/2017  . Hypokalemia 06/07/2017  . Hypomagnesemia 06/07/2017  . Tobacco use disorder 06/07/2017  . Atelectasis of both lungs 06/07/2017  . Acute maxillary sinusitis 10/13/2016  . Abnormal LFTs   . Pain in the chest   . Diabetes mellitus with complication ([place])   . Hypoxia 12/29/2015  . Uncontrolled type 2 diabetes mellitus ([place]) 12/29/2015  . Dyspnea 12/29/2015  . LLQ pain 12/29/2015  . Lupus 12/29/2015  . Chest pain 12/29/2015  . Transaminitis 12/29/2015  . Chronic pain 12/29/2015  . Depression 12/29/2015    Past Medical History:  Diagnosis Date  . Autoimmune hepatitis ([place])   . Diabetes mellitus without complication ([place])   . GERD (gastroesophageal reflux disease)   . Hypertension   . Lupus   . Morbid obesity ([place])   . Shortness of breath dyspnea   . Tobacco use disorder     Family History  Problem Relation Age of Onset  . Diabetes Father   . Hypertension Father   . Hypertension Mother    Past Surgical History:  Procedure Laterality Date  . CHOLECYSTECTOMY    . DILATION AND CURETTAGE OF UTERUS    . EYE SURGERY Bilateral    laser surgery    Social History   Social History Narrative  . Not on file     Objective: Vital Signs: BP 127/86 (BP Location: Left Arm, Patient Position: Sitting, Cuff Size: Large)   Pulse 67    Resp 19   Ht _0  (1.676 m)   Wt 266 lb (120.7 kg)   BMI 42.93 kg/m    Physical Exam  Constitutional: She is oriented to person, place, and time. She appears well-developed and well-nourished.  Morbidly obese  HENT:  Head: Normocephalic and atraumatic.  Eyes: Conjunctivae and EOM are normal.  Neck: Normal range of motion.  Cardiovascular: Normal rate, regular rhythm, normal heart sounds and intact distal pulses.  Pulmonary/Chest: Effort normal and breath sounds normal.  Abdominal: Soft. Bowel sounds are normal.  Lymphadenopathy:    She has no cervical adenopathy.  Neurological: She is alert and oriented to person, place, and time.  Skin: Skin is warm and dry. Capillary refill takes less than 2 seconds.  Psychiatric: She has a normal mood and affect. Her behavior is normal.  Nursing note and vitals reviewed.    Musculoskeletal Exam: C-spine limited range of motion with discomfort. She has limited range of motion of lumbar spine with discomfort. Shoulder joints elbow joints wrist joint MCPs PIPs DIPs with good range of motion with no synovitis. Hip joints knee joints ankles MTPs PIPs DIPs are good range of motion with no synovitis. She has some discomfort range of motion  of her knee joints with crepitus without any warmth swelling or effusion.  CDAI Exam: No CDAI exam completed.    Investigation: No additional findings. 10/26/2016 CBC WBC 13.5, magnesium 1.7 low, CMP AST 65, ALT 138, hemoglobin A1c 7.7 09/13/2016 AST 37 ALT 83, magnesium 1.5 08/15/2017 x-ray of the lumbar spine from [facility] shows mild levoscoliosis, mild DDD and facet joint arthropathy X-ray cervical spine: C5-6 degenerative disc disease with spondylosis and mild retrolisthesis of C5 on C6 mild right neuroforaminal forearm and no bone compromise at C5-6 08/11/2017 LDL 60, CMP normal, CBC normal, UA negative, UDS negative, vitamin D 42.49, uric acid 9.4, PTH normal, T3 and T4 normal, hemoglobin A1c  6.3%, ANA positive, RNP positive, (dsDNA, Smith, SSA, SSB negative), rheumatoid factor less than 7 Imaging: No results found.  Speciality Comments: No specialty comments available.    Procedures:  No procedures performed Allergies: Ace inhibitors; Lisinopril; Tramadol; Iodinated diagnostic agents; Iodine; Janumet [sitagliptin-metformin hcl]; and Victoza [liraglutide]   Assessment / Plan:     Visit Diagnoses: Autoimmune hepatitis ([place]) - +ANA, +RNP -patient gives history of autoimmune hepatitis with some elevated LFTs in the past. She's been on chronic prednisone therapy. She's been followed up at different universities including [facility], [place] and then [facility]. She was followed up by a gastroenterologist there and has not had follow-up visit in a year. I've encouraged her to schedule an appointment with the gastroenterologist as soon as possible for continued to of care. At this time she does not have any clinical features of systemic lupus. I plan on doing following labs to complete the workup. It is not unusual to see positive ANA and positive RNP with autoimmune hepatitis.: COMPLETE METABOLIC PANEL WITH GFR, ANA, Beta-2 glycoprotein antibodies, Cardiolipin antibodies, IgG, IgM, IgA, C3 and C4, Lupus Anticoagulant Eval w/Reflex  Chronic pain of both knees - Patient reports that she has been diagnosed with osteoarthritis and has been treated by orthopedic surgeon. I offered x-rays of bilateral knee joints but she declined.  DDD (degenerative disc disease), cervical: She continues to have a stiffness and discomfort in her C-spine and some radiculopathy to the left shoulder. I reviewed her x-rays of her C-spine which showed mild disc disease and some right-sided formal narrowing at C5-6.  DDD (degenerative disc disease), lumbar: She also has mild disc disease on her lumbar spine based on the x-rays.  Bilateral feet and ankle pain: I did not appreciate any synovitis. She declined  x-rays. She has mild pedal edema.  Other medical problems are listed as follows:  Vitamin D deficiency  Hypomagnesemia  Tobacco use disorder  Uncontrolled type 2 diabetes mellitus - Insulin-dependent  Essential hypertension  Class 3 severe obesity  History of depression  Gastroesophageal reflux disease  Dyslipidemia  History of migraine  History of meningioma    Orders: Orders Placed This Encounter  Procedures  . COMPLETE METABOLIC PANEL WITH GFR  . ANA  . Beta-2 glycoprotein antibodies  . Cardiolipin antibodies, IgG, IgM, IgA  . C3 and C4  . Lupus Anticoagulant Eval w/Reflex   No orders of the defined types were placed in this encounter.   Face-to-face time spent with patient was 50 minutes. Greater than 50% of time was spent in counseling and coordination of care.  Follow-Up Instructions: Return for Autoimmune hepatitis, DDD.   Bo Merino, MD  Note - This record has been created using Editor, commissioning.  Chart creation errors have been sought, but may not always  have been located.  Such creation errors do not reflect on  the standard of medical care.

## 2017-11-23 ENCOUNTER — Encounter: Payer: Self-pay | Admitting: Rheumatology

## 2017-11-23 ENCOUNTER — Ambulatory Visit (INDEPENDENT_AMBULATORY_CARE_PROVIDER_SITE_OTHER): Payer: Medicare Other | Admitting: Rheumatology

## 2017-11-23 VITALS — BP 127/86 | HR 67 | Resp 19 | Ht 66.0 in | Wt 266.0 lb

## 2017-11-23 DIAGNOSIS — E559 Vitamin D deficiency, unspecified: Secondary | ICD-10-CM

## 2017-11-23 DIAGNOSIS — M5030 Other cervical disc degeneration, unspecified cervical region: Secondary | ICD-10-CM | POA: Diagnosis not present

## 2017-11-23 DIAGNOSIS — Z8659 Personal history of other mental and behavioral disorders: Secondary | ICD-10-CM | POA: Diagnosis not present

## 2017-11-23 DIAGNOSIS — M25562 Pain in left knee: Secondary | ICD-10-CM

## 2017-11-23 DIAGNOSIS — E1165 Type 2 diabetes mellitus with hyperglycemia: Secondary | ICD-10-CM | POA: Diagnosis not present

## 2017-11-23 DIAGNOSIS — Z86018 Personal history of other benign neoplasm: Secondary | ICD-10-CM

## 2017-11-23 DIAGNOSIS — G8929 Other chronic pain: Secondary | ICD-10-CM

## 2017-11-23 DIAGNOSIS — I10 Essential (primary) hypertension: Secondary | ICD-10-CM | POA: Diagnosis not present

## 2017-11-23 DIAGNOSIS — K754 Autoimmune hepatitis: Secondary | ICD-10-CM | POA: Diagnosis not present

## 2017-11-23 DIAGNOSIS — M25561 Pain in right knee: Secondary | ICD-10-CM

## 2017-11-23 DIAGNOSIS — E785 Hyperlipidemia, unspecified: Secondary | ICD-10-CM

## 2017-11-23 DIAGNOSIS — Z6841 Body Mass Index (BMI) 40.0 and over, adult: Secondary | ICD-10-CM

## 2017-11-23 DIAGNOSIS — M5136 Other intervertebral disc degeneration, lumbar region: Secondary | ICD-10-CM

## 2017-11-23 DIAGNOSIS — K219 Gastro-esophageal reflux disease without esophagitis: Secondary | ICD-10-CM | POA: Diagnosis not present

## 2017-11-23 DIAGNOSIS — F17200 Nicotine dependence, unspecified, uncomplicated: Secondary | ICD-10-CM | POA: Diagnosis not present

## 2017-11-23 DIAGNOSIS — E66813 Obesity, class 3: Secondary | ICD-10-CM

## 2017-11-23 DIAGNOSIS — Z8669 Personal history of other diseases of the nervous system and sense organs: Secondary | ICD-10-CM

## 2017-11-23 DIAGNOSIS — M51369 Other intervertebral disc degeneration, lumbar region without mention of lumbar back pain or lower extremity pain: Secondary | ICD-10-CM

## 2017-11-25 LAB — LUPUS ANTICOAGULANT EVAL W/ REFLEX
PTT LA SCREEN: 57 s — ABNORMAL HIGH (ref <=40)
dRVVT Screen: 44 s (ref <=45)

## 2017-11-25 LAB — CARDIOLIPIN ANTIBODIES, IGG, IGM, IGA
ANTICARDIOLIPIN IGG: 42 [GPL'U] — AB
Anticardiolipin IgA: <11 [APL'U]
Anticardiolipin IgM: <12 [MPL'U]

## 2017-11-25 LAB — COMPLETE METABOLIC PANEL WITH GFR
AG RATIO: 0.6 (calc) — AB (ref 1.0–2.5)
ALBUMIN MSPROF: 3.5 g/dL — AB (ref 3.6–5.1)
ALT: 90 U/L — AB (ref 6–29)
AST: 65 U/L — ABNORMAL HIGH (ref 10–35)
Alkaline phosphatase (APISO): 63 U/L (ref 33–130)
BILIRUBIN TOTAL: 0.7 mg/dL (ref 0.2–1.2)
BUN: 15 mg/dL (ref 7–25)
CALCIUM: 9.3 mg/dL (ref 8.6–10.4)
CHLORIDE: 103 mmol/L (ref 98–110)
CO2: 23 mmol/L (ref 20–32)
Creat: 0.79 mg/dL (ref 0.50–1.05)
GFR, EST AFRICAN AMERICAN: 96 mL/min/{1.73_m2} (ref >=60)
GFR, EST NON AFRICAN AMERICAN: 83 mL/min/{1.73_m2} (ref >=60)
GLUCOSE: 136 mg/dL — AB (ref 65–99)
Globulin: 6 g/dL (calc) — ABNORMAL HIGH (ref 1.9–3.7)
Potassium: 3.8 mmol/L (ref 3.5–5.3)
Sodium: 133 mmol/L — ABNORMAL LOW (ref 135–146)
TOTAL PROTEIN: 9.5 g/dL — AB (ref 6.1–8.1)

## 2017-11-25 LAB — C3 AND C4
C3 Complement: 183 mg/dL (ref 83–193)
C4 Complement: 14 mg/dL — ABNORMAL LOW (ref 15–57)

## 2017-11-25 LAB — BETA-2 GLYCOPROTEIN ANTIBODIES: Beta-2 Glyco 1 IgA: <9 SAU (ref <=20)

## 2017-11-25 LAB — ANA: Anti Nuclear Antibody(ANA): POSITIVE — AB

## 2017-11-25 LAB — ANTI-NUCLEAR AB-TITER (ANA TITER)

## 2017-11-25 LAB — THROMBIN CLOTTING TIME: THROMBIN CLOTTING TIME: 20 s — AB (ref 13–19)

## 2017-11-25 LAB — RFLX HEXAGONAL PHASE CONFIRM: Hexagonal Phase Confirm: POSITIVE — AB

## 2017-12-01 ENCOUNTER — Telehealth: Payer: Self-pay | Admitting: Rheumatology

## 2017-12-01 NOTE — Progress Notes (Signed)
Will discuss at fu visit

## 2017-12-01 NOTE — Telephone Encounter (Signed)
Patient called to request results of her bloodwork.  CB# [phone]

## 2017-12-08 NOTE — Telephone Encounter (Signed)
Left message to advise patient we will go over blood work at her new patient follow up visit.

## 2017-12-22 ENCOUNTER — Telehealth: Payer: Self-pay | Admitting: *Deleted

## 2017-12-22 NOTE — Telephone Encounter (Signed)
Patient called, she does not want to come back to see Dr. Estanislado Pandy any longer. Patient requested labs to be mailed to her. Complete.

## 2017-12-27 ENCOUNTER — Ambulatory Visit: Payer: Medicare HMO | Admitting: Rheumatology

## 2018-02-12 ENCOUNTER — Emergency Department (HOSPITAL_COMMUNITY): Payer: Medicare Other

## 2018-02-12 ENCOUNTER — Other Ambulatory Visit: Payer: Self-pay

## 2018-02-12 ENCOUNTER — Encounter (HOSPITAL_COMMUNITY): Payer: Self-pay | Admitting: Emergency Medicine

## 2018-02-12 ENCOUNTER — Observation Stay (HOSPITAL_COMMUNITY)
Admission: EM | Admit: 2018-02-12 | Discharge: 2018-02-14 | Disposition: A | Discharge status: Home / Self Care | Payer: Medicare Other | Attending: Internal Medicine | Admitting: Internal Medicine

## 2018-02-12 DIAGNOSIS — K754 Autoimmune hepatitis: Secondary | ICD-10-CM | POA: Diagnosis not present

## 2018-02-12 DIAGNOSIS — F329 Major depressive disorder, single episode, unspecified: Secondary | ICD-10-CM | POA: Insufficient documentation

## 2018-02-12 DIAGNOSIS — M5030 Other cervical disc degeneration, unspecified cervical region: Secondary | ICD-10-CM | POA: Insufficient documentation

## 2018-02-12 DIAGNOSIS — Z794 Long term (current) use of insulin: Secondary | ICD-10-CM | POA: Diagnosis not present

## 2018-02-12 DIAGNOSIS — Z6841 Body Mass Index (BMI) 40.0 and over, adult: Secondary | ICD-10-CM | POA: Diagnosis not present

## 2018-02-12 DIAGNOSIS — R740 Nonspecific elevation of levels of transaminase and lactic acid dehydrogenase [LDH]: Secondary | ICD-10-CM

## 2018-02-12 DIAGNOSIS — R0600 Dyspnea, unspecified: Secondary | ICD-10-CM | POA: Diagnosis present

## 2018-02-12 DIAGNOSIS — M329 Systemic lupus erythematosus, unspecified: Secondary | ICD-10-CM | POA: Diagnosis not present

## 2018-02-12 DIAGNOSIS — K219 Gastro-esophageal reflux disease without esophagitis: Secondary | ICD-10-CM | POA: Insufficient documentation

## 2018-02-12 DIAGNOSIS — I10 Essential (primary) hypertension: Secondary | ICD-10-CM | POA: Insufficient documentation

## 2018-02-12 DIAGNOSIS — M5136 Other intervertebral disc degeneration, lumbar region: Secondary | ICD-10-CM | POA: Diagnosis not present

## 2018-02-12 DIAGNOSIS — Z79899 Other long term (current) drug therapy: Secondary | ICD-10-CM | POA: Diagnosis not present

## 2018-02-12 DIAGNOSIS — E785 Hyperlipidemia, unspecified: Secondary | ICD-10-CM | POA: Diagnosis not present

## 2018-02-12 DIAGNOSIS — F17210 Nicotine dependence, cigarettes, uncomplicated: Secondary | ICD-10-CM | POA: Insufficient documentation

## 2018-02-12 DIAGNOSIS — E559 Vitamin D deficiency, unspecified: Secondary | ICD-10-CM | POA: Diagnosis not present

## 2018-02-12 DIAGNOSIS — Z91041 Radiographic dye allergy status: Secondary | ICD-10-CM | POA: Diagnosis not present

## 2018-02-12 DIAGNOSIS — J9621 Acute and chronic respiratory failure with hypoxia: Secondary | ICD-10-CM | POA: Diagnosis present

## 2018-02-12 DIAGNOSIS — J189 Pneumonia, unspecified organism: Secondary | ICD-10-CM | POA: Diagnosis not present

## 2018-02-12 DIAGNOSIS — Z888 Allergy status to other drugs, medicaments and biological substances status: Secondary | ICD-10-CM | POA: Insufficient documentation

## 2018-02-12 DIAGNOSIS — J181 Lobar pneumonia, unspecified organism: Secondary | ICD-10-CM

## 2018-02-12 DIAGNOSIS — I200 Unstable angina: Secondary | ICD-10-CM

## 2018-02-12 DIAGNOSIS — R1032 Left lower quadrant pain: Secondary | ICD-10-CM

## 2018-02-12 DIAGNOSIS — R42 Dizziness and giddiness: Secondary | ICD-10-CM

## 2018-02-12 DIAGNOSIS — E1140 Type 2 diabetes mellitus with diabetic neuropathy, unspecified: Secondary | ICD-10-CM | POA: Diagnosis not present

## 2018-02-12 DIAGNOSIS — J9620 Acute and chronic respiratory failure, unspecified whether with hypoxia or hypercapnia: Secondary | ICD-10-CM | POA: Diagnosis present

## 2018-02-12 DIAGNOSIS — R7401 Elevation of levels of liver transaminase levels: Secondary | ICD-10-CM

## 2018-02-12 LAB — CBC WITH DIFFERENTIAL/PLATELET
BASOS ABS: 0 10*3/uL (ref 0.0–0.1)
BASOS PCT: 0 %
Eosinophils Absolute: 0.2 10*3/uL (ref 0.0–0.7)
Eosinophils Relative: 1 %
HEMATOCRIT: 39.3 % (ref 36.0–46.0)
HEMOGLOBIN: 12.8 g/dL (ref 12.0–15.0)
LYMPHS PCT: 38 %
Lymphs Abs: 4 10*3/uL (ref 0.7–4.0)
MCH: 31.4 pg (ref 26.0–34.0)
MCHC: 32.6 g/dL (ref 30.0–36.0)
MCV: 96.3 fL (ref 78.0–100.0)
Monocytes Absolute: 0.9 10*3/uL (ref 0.1–1.0)
Monocytes Relative: 9 %
NEUTROS ABS: 5.4 10*3/uL (ref 1.7–7.7)
NEUTROS PCT: 52 %
Platelets: 268 10*3/uL (ref 150–400)
RBC: 4.08 MIL/uL (ref 3.87–5.11)
RDW: 15 % (ref 11.5–15.5)
WBC: 10.4 10*3/uL (ref 4.0–10.5)

## 2018-02-12 LAB — BASIC METABOLIC PANEL
ANION GAP: 7 (ref 5–15)
BUN: 20 mg/dL (ref 6–20)
CALCIUM: 8.5 mg/dL — AB (ref 8.9–10.3)
CO2: 29 mmol/L (ref 22–32)
Chloride: 104 mmol/L (ref 101–111)
Creatinine, Ser: 0.95 mg/dL (ref 0.44–1.00)
GLUCOSE: 182 mg/dL — AB (ref 65–99)
Potassium: 3.9 mmol/L (ref 3.5–5.1)
SODIUM: 140 mmol/L (ref 135–145)

## 2018-02-12 LAB — I-STAT TROPONIN, ED: Troponin i, poc: 0 ng/mL (ref 0.00–0.08)

## 2018-02-12 MED ORDER — SODIUM CHLORIDE 0.9 % IV BOLUS (SEPSIS)
1000.0000 mL | Freq: Once | INTRAVENOUS | Status: AC
  Administered 2018-02-12: 1000 mL via INTRAVENOUS

## 2018-02-12 MED ORDER — BENZONATATE 100 MG PO CAPS
200.0000 mg | ORAL_CAPSULE | Freq: Once | ORAL | Status: AC
  Administered 2018-02-12: 200 mg via ORAL
  Filled 2018-02-12: qty 2

## 2018-02-12 NOTE — ED Notes (Signed)
Pt prefers to keep home gown on.

## 2018-02-12 NOTE — ED Notes (Signed)
Pt ambulated in hallway, tolerating well. Oxygen saturations 90%-93%.

## 2018-02-12 NOTE — ED Triage Notes (Signed)
Transported by [facility] from home--recently diagnosed with flu/ pneumonia x 1 week ago. Reports compliance with prescribed medication. Hx of lupus, chief compliant today is dizziness (onset today). A & O x 4. EMS administered 200 cc of NS and Zofran 80m IV.

## 2018-02-12 NOTE — ED Provider Notes (Signed)
[facility] DEPT Provider Note   CSN: [id number] Arrival date & time: 02/12/18  1853     History   Chief Complaint Chief Complaint  Patient presents with  . Dizziness    HPI Bessie Boyte is a 57 y.o. female with a past medical history of lupus, IDDM, autoimmune hepatitis, hypertension, obesity, who presents to ED for evaluation of lightheadedness, "faintness" since this morning.  She is currently being treated for flu and pneumonia as diagnosed by her PCP.  She is in the middle of her Z-Pak course.  She continues to have a hacking, productive cough with thick white sputum.  Her main concern is the lightheadedness that began this morning.  She tells me because of the lightheadedness, she has had to ambulate with support of the wall.  Denies any head injury or loss of consciousness.  She reports normal appetite and denies any nausea, vomiting, diarrhea, chest pain, abdominal pain, vision changes.  HPI  Past Medical History:  Diagnosis Date  . Autoimmune hepatitis ([place])   . Diabetes mellitus without complication ([place])   . GERD (gastroesophageal reflux disease)   . Hypertension   . Lupus   . Morbid obesity ([place])   . Shortness of breath dyspnea   . Tobacco use disorder     Patient Active Problem List   Diagnosis Date Noted  . Autoimmune hepatitis ([place]) 11/18/2017  . Essential hypertension 11/18/2017  . Class 3 severe obesity due to excess calories with serious comorbidity and body mass index (BMI) of 40.0 to 44.9 in adult ([place]) 11/18/2017  . History of depression 11/18/2017  . Gastroesophageal reflux disease without esophagitis 11/18/2017  . Dyslipidemia 11/18/2017  . Vitamin D deficiency 11/18/2017  . History of migraine 11/18/2017  . History of meningioma 11/18/2017  . DDD (degenerative disc disease), cervical 11/18/2017  . DDD (degenerative disc disease), lumbar 11/18/2017  . Acute diverticulitis 06/07/2017  . Hypokalemia 06/07/2017    . Hypomagnesemia 06/07/2017  . Tobacco use disorder 06/07/2017  . Atelectasis of both lungs 06/07/2017  . Acute maxillary sinusitis 10/13/2016  . Abnormal LFTs   . Pain in the chest   . Diabetes mellitus with complication ([place])   . Hypoxia 12/29/2015  . Uncontrolled type 2 diabetes mellitus ([place]) 12/29/2015  . Dyspnea 12/29/2015  . LLQ pain 12/29/2015  . Lupus 12/29/2015  . Chest pain 12/29/2015  . Transaminitis 12/29/2015  . Chronic pain 12/29/2015  . Depression 12/29/2015    Past Surgical History:  Procedure Laterality Date  . CHOLECYSTECTOMY    . DILATION AND CURETTAGE OF UTERUS    . EYE SURGERY Bilateral    laser surgery     OB History    No data available       Home Medications    Prior to Admission medications   Medication Sig Start Date End Date Taking? Authorizing Provider  amLODipine (NORVASC) 10 MG tablet Take 10 mg by mouth at bedtime.    Yes [provider]  azithromycin (ZITHROMAX) 250 MG tablet Take 250-500 mg by mouth See admin instructions. 500 mg now, 250 mg daily x 4 days 02/09/18  Yes [provider]  Cholecalciferol (VITAMIN D PO) Take 1 tablet by mouth every evening.    Yes [provider]  DULoxetine (CYMBALTA) 60 MG capsule Take 60 mg by mouth at bedtime.  08/26/16  Yes [provider]  insulin lispro protamine-lispro (HUMALOG 75/25 MIX) (75-25) 100 UNIT/ML SUSP injection Inject 90 Units into the skin 3 (three)  times daily with meals.   Yes [provider]  LYRICA 100 MG capsule Take 100 mg by mouth at bedtime. 01/23/18  Yes [provider]  magnesium 30 MG tablet Take 30 mg by mouth at bedtime.    Yes [provider]  Multiple Vitamin (MULTIVITAMIN) tablet Take 1 tablet by mouth at bedtime.    Yes [provider]  nadolol (CORGARD) 40 MG tablet Take 40 mg by mouth every evening.  09/16/16  Yes [provider]  Omega-3 Fatty Acids (FISH OIL PO) Take 1 capsule by mouth  every evening.    Yes [provider]  omeprazole (PRILOSEC) 40 MG capsule Take 40 mg by mouth every evening.  06/03/17  Yes [provider]  ondansetron (ZOFRAN ODT) 4 MG disintegrating tablet Take 1 tablet (4 mg total) by mouth every 8 (eight) hours as needed for nausea or vomiting. 06/05/17  Yes Isla Pence, MD  Oxycodone HCl 10 MG TABS Take 10 mg by mouth every 6 (six) hours as needed (pain).   Yes [provider]  TURMERIC PO Take 400 mg by mouth every evening.    Yes [provider]  acetaminophen (TYLENOL) 325 MG tablet Take 2 tablets (650 mg total) by mouth every 4 (four) hours as needed for mild pain, fever or headache. Patient not taking: Reported on 11/23/2017 06/09/17   Robbie Lis, MD  fluticasone [facility]) 50 MCG/ACT nasal spray Place 1 spray into both nostrils daily. Patient not taking: Reported on 02/12/2018 02/11/17   Monico Blitz, MD  ursodiol (ACTIGALL) 300 MG capsule Take 1 capsule (300 mg total) by mouth 3 (three) times daily. Patient not taking: Reported on 11/23/2017 06/09/17   Robbie Lis, MD    Family History Family History  Problem Relation Age of Onset  . Diabetes Father   . Hypertension Father   . Hypertension Mother     Social History Social History   Tobacco Use  . Smoking status: Current Every Day Smoker    Packs/day: 1.00    Years: 30.00    Pack years: 30.00    Types: Cigarettes  . Smokeless tobacco: Never Used  Substance Use Topics  . Alcohol use: No  . Drug use: No     Allergies   Ace inhibitors; Lisinopril; Tramadol; Iodinated diagnostic agents; Iodine; Janumet [sitagliptin-metformin hcl]; and Victoza [liraglutide]   Review of Systems Review of Systems  Constitutional: Negative for appetite change, chills and fever.  HENT: Negative for ear pain, rhinorrhea, sneezing and sore throat.   Eyes: Negative for photophobia and visual disturbance.  Respiratory: Positive for cough. Negative for  chest tightness, shortness of breath and wheezing.   Cardiovascular: Negative for chest pain and palpitations.  Gastrointestinal: Negative for abdominal pain, blood in stool, constipation, diarrhea, nausea and vomiting.  Genitourinary: Negative for dysuria, hematuria and urgency.  Musculoskeletal: Negative for myalgias.  Skin: Negative for rash.  Neurological: Positive for light-headedness. Negative for dizziness, weakness and numbness.     Physical Exam Updated Vital Signs BP (!) 116/58 (BP Location: Right Arm)   Pulse 65   Temp 98.6 F (37 C) (Oral)   Resp 16   Ht _0  (1.676 m)   Wt 122.5 kg (270 lb)   SpO2 94%   BMI 43.58 kg/m   Physical Exam  Constitutional: She is oriented to person, place, and time. She appears well-developed and well-nourished. No distress.  Nontoxic appearing and in no acute distress. Speaking in complete sentences without  difficulty. Hacking cough noted on my examination.  HENT:  Head: Normocephalic and atraumatic.  Nose: Nose normal.  Eyes: Conjunctivae and EOM are normal. Left eye exhibits no discharge. No scleral icterus.  Neck: Normal range of motion. Neck supple.  Cardiovascular: Normal rate, regular rhythm, normal heart sounds and intact distal pulses. Exam reveals no gallop and no friction rub.  No murmur heard. Pulmonary/Chest: Effort normal and breath sounds normal. No respiratory distress.  Abdominal: Soft. Bowel sounds are normal. She exhibits no distension. There is no tenderness. There is no guarding.  Musculoskeletal: Normal range of motion. She exhibits no edema.  Neurological: She is alert and oriented to person, place, and time. No cranial nerve deficit or sensory deficit. She exhibits normal muscle tone. Coordination normal.  Pupils reactive. No facial asymmetry noted. Cranial nerves appear grossly intact. Sensation intact to light touch on face, BUE and BLE. Strength 5/5 in BUE and BLE.  Skin: Skin is warm and dry. No rash noted.    Psychiatric: She has a normal mood and affect.  Nursing note and vitals reviewed.    ED Treatments / Results  Labs (all labs ordered are listed, but only abnormal results are displayed) Labs Reviewed  BASIC METABOLIC PANEL - Abnormal; Notable for the following components:      Result Value   Glucose, Bld 182 (*)    Calcium 8.5 (*)    All other components within normal limits  CBC WITH DIFFERENTIAL/PLATELET  I-STAT TROPONIN, ED    EKG  EKG Interpretation  Date/Time:  Sunday February 12 2018 20:41:57 EDT Ventricular Rate:  62 PR Interval:    QRS Duration: 90 QT Interval:  438 QTC Calculation: 445 R Axis:   73 Text Interpretation:  Sinus rhythm since last tracing no significant change Confirmed by Daleen Bo [phone]) on 02/12/2018 8:50:26 PM       Radiology Dg Chest 2 View  Result Date: 02/12/2018 CLINICAL DATA:  56 year old female with cough and dizziness EXAM: CHEST - 2 VIEW COMPARISON:  Chest radiograph dated 06/05/2017 FINDINGS: Patchy area of increased density at the left lung base suspicious for developing infiltrate. Clinical correlation and follow-up to resolution recommended. Linear atelectatic changes of the lung bases and along the minor fissure. There is no pleural effusion or pneumothorax. The cardiac silhouette is within normal limits. No acute osseous pathology. IMPRESSION: Left lung base densities concerning for developing infiltrate. Clinical correlation and follow-up to resolution recommended. Electronically Signed   By: Anner Crete M.D.   On: 02/12/2018 20:14    Procedures Procedures (including critical care time)  Medications Ordered in ED Medications  sodium chloride 0.9 % bolus 1,000 mL (1,000 mLs Intravenous New Bag/Given 02/12/18 2141)  benzonatate (TESSALON) capsule 200 mg (200 mg Oral Given 02/12/18 2140)     Initial Impression / Assessment and Plan / ED Course  I have reviewed the triage vital signs and the nursing notes.  Pertinent labs  & imaging results that were available during my care of the patient were reviewed by me and considered in my medical decision making (see chart for details).     Patient, with a past medical history of lupus, autoimmune hepatitis, presents to ED for evaluation of lightheadedness, "faintness" since this morning.  She is currently being treated for influenza pneumonia as diagnosed by her PCP.  She was taking Levaquin at first and is now in the middle of her Z-Pak course.  She continues to have a hacking, productive cough with thick white sputum.  She denies any syncope, head injuries or chest pain.  On physical exam she does not appear dehydrated.  She has no deficits on her neurological examination.  She was initially satting at low 90s and was placed on 2 L of oxygen.  She tells me she does not use oxygen at baseline.  Chest x-ray shows left-sided infiltrate.  EKG with no ischemic changes.  Troponin negative, CBC, BMP unremarkable.  Patient was given fluids here with no improvement in her symptoms.  She ambulated with pulse ox dipping down to 90%.  She tells me she has an oxygen monitor at home and she usually runs at about 94-95%.  I do feel that she would need to be admitted based on her new oxygen requirement and no improvement in her pneumonia despite the use of 2 antibiotics.  Patient agrees with this plan.  Will consult hospitalist for admission.  Appreciate the help of hospitalist for management of this patient.  Portions of this note were generated with [employer]. Dictation errors may occur despite best attempts at proofreading.   Final Clinical Impressions(s) / ED Diagnoses   Final diagnoses:  None    ED Discharge Orders    None       Delia Heady, PA-C 02/12/18 2336    Daleen Bo, MD 02/16/18 1517

## 2018-02-12 NOTE — ED Notes (Signed)
Bed: WA20 Expected date:  Expected time:  Means of arrival:  Comments: House keeping is cleaning floor

## 2018-02-12 NOTE — ED Notes (Signed)
ED Provider at bedside. 

## 2018-02-13 ENCOUNTER — Encounter (HOSPITAL_COMMUNITY): Payer: Self-pay | Admitting: Internal Medicine

## 2018-02-13 DIAGNOSIS — R0600 Dyspnea, unspecified: Secondary | ICD-10-CM

## 2018-02-13 DIAGNOSIS — J181 Lobar pneumonia, unspecified organism: Secondary | ICD-10-CM

## 2018-02-13 DIAGNOSIS — J189 Pneumonia, unspecified organism: Secondary | ICD-10-CM | POA: Diagnosis present

## 2018-02-13 LAB — EXPECTORATED SPUTUM ASSESSMENT W GRAM STAIN, RFLX TO RESP C

## 2018-02-13 LAB — CBC
HEMATOCRIT: 40.1 % (ref 36.0–46.0)
Hemoglobin: 12.9 g/dL (ref 12.0–15.0)
MCH: 31.1 pg (ref 26.0–34.0)
MCHC: 32.2 g/dL (ref 30.0–36.0)
MCV: 96.6 fL (ref 78.0–100.0)
Platelets: 257 10*3/uL (ref 150–400)
RBC: 4.15 MIL/uL (ref 3.87–5.11)
RDW: 15 % (ref 11.5–15.5)
WBC: 9.5 10*3/uL (ref 4.0–10.5)

## 2018-02-13 LAB — COMPREHENSIVE METABOLIC PANEL
ALT: 112 U/L — ABNORMAL HIGH (ref 14–54)
AST: 72 U/L — ABNORMAL HIGH (ref 15–41)
Albumin: 3.1 g/dL — ABNORMAL LOW (ref 3.5–5.0)
Alkaline Phosphatase: 62 U/L (ref 38–126)
Anion gap: 6 (ref 5–15)
BILIRUBIN TOTAL: 0.7 mg/dL (ref 0.3–1.2)
BUN: 17 mg/dL (ref 6–20)
CHLORIDE: 105 mmol/L (ref 101–111)
CO2: 29 mmol/L (ref 22–32)
Calcium: 8.5 mg/dL — ABNORMAL LOW (ref 8.9–10.3)
Creatinine, Ser: 0.74 mg/dL (ref 0.44–1.00)
GFR calc Af Amer: >60 mL/min (ref >60)
GFR calc non Af Amer: >60 mL/min (ref >60)
Glucose, Bld: 158 mg/dL — ABNORMAL HIGH (ref 65–99)
POTASSIUM: 3.7 mmol/L (ref 3.5–5.1)
Sodium: 140 mmol/L (ref 135–145)
TOTAL PROTEIN: 9.2 g/dL — AB (ref 6.5–8.1)

## 2018-02-13 LAB — MRSA PCR SCREENING: MRSA BY PCR: NEGATIVE

## 2018-02-13 LAB — GLUCOSE, CAPILLARY
GLUCOSE-CAPILLARY: 172 mg/dL — AB (ref 65–99)
Glucose-Capillary: 179 mg/dL — ABNORMAL HIGH (ref 65–99)
Glucose-Capillary: 126 mg/dL — ABNORMAL HIGH (ref 65–99)
Glucose-Capillary: 126 mg/dL — ABNORMAL HIGH (ref 65–99)

## 2018-02-13 LAB — STREP PNEUMONIAE URINARY ANTIGEN: Strep Pneumo Urinary Antigen: NEGATIVE

## 2018-02-13 LAB — HIV ANTIBODY (ROUTINE TESTING W REFLEX): HIV Screen 4th Generation wRfx: NONREACTIVE

## 2018-02-13 LAB — EXPECTORATED SPUTUM ASSESSMENT W REFEX TO RESP CULTURE: SPECIAL REQUESTS: NORMAL

## 2018-02-13 MED ORDER — SODIUM CHLORIDE 0.9 % IV SOLN: 1500.0000 mg | INTRAVENOUS | Status: DC

## 2018-02-13 MED ORDER — NADOLOL 40 MG PO TABS
40.0000 mg | ORAL_TABLET | Freq: Every evening | ORAL | Status: DC
  Administered 2018-02-13: 40 mg via ORAL
  Filled 2018-02-13 (×2): qty 1

## 2018-02-13 MED ORDER — PANTOPRAZOLE SODIUM 40 MG PO TBEC
40.0000 mg | DELAYED_RELEASE_TABLET | Freq: Every day | ORAL | Status: DC
  Administered 2018-02-13 – 2018-02-14 (×2): 40 mg via ORAL
  Filled 2018-02-13 (×2): qty 1

## 2018-02-13 MED ORDER — ENOXAPARIN SODIUM 30 MG/0.3ML ~~LOC~~ SOLN
30.0000 mg | SUBCUTANEOUS | Status: DC
  Administered 2018-02-13: 30 mg via SUBCUTANEOUS
  Filled 2018-02-13: qty 0.3

## 2018-02-13 MED ORDER — ENOXAPARIN SODIUM 60 MG/0.6ML ~~LOC~~ SOLN
60.0000 mg | SUBCUTANEOUS | Status: DC
  Administered 2018-02-14: 60 mg via SUBCUTANEOUS
  Filled 2018-02-13: qty 0.6

## 2018-02-13 MED ORDER — SODIUM CHLORIDE 0.9 % IV SOLN
500.0000 mg | INTRAVENOUS | Status: DC
  Administered 2018-02-13 – 2018-02-14 (×2): 500 mg via INTRAVENOUS
  Filled 2018-02-13 (×2): qty 500

## 2018-02-13 MED ORDER — AMLODIPINE BESYLATE 10 MG PO TABS
10.0000 mg | ORAL_TABLET | Freq: Every day | ORAL | Status: DC
  Administered 2018-02-13: 10 mg via ORAL
  Filled 2018-02-13: qty 1

## 2018-02-13 MED ORDER — INSULIN ASPART PROT & ASPART (70-30 MIX) 100 UNIT/ML ~~LOC~~ SUSP
50.0000 [IU] | Freq: Two times a day (BID) | SUBCUTANEOUS | Status: DC
  Administered 2018-02-13 (×2): 50 [IU] via SUBCUTANEOUS
  Filled 2018-02-13: qty 10

## 2018-02-13 MED ORDER — PREGABALIN 100 MG PO CAPS
100.0000 mg | ORAL_CAPSULE | Freq: Every day | ORAL | Status: DC
  Administered 2018-02-13: 100 mg via ORAL
  Filled 2018-02-13: qty 1

## 2018-02-13 MED ORDER — OXYCODONE HCL 5 MG PO TABS
10.0000 mg | ORAL_TABLET | Freq: Four times a day (QID) | ORAL | Status: DC | PRN
  Administered 2018-02-13: 10 mg via ORAL
  Filled 2018-02-13: qty 2

## 2018-02-13 MED ORDER — VANCOMYCIN HCL 10 G IV SOLR
2500.0000 mg | Freq: Once | INTRAVENOUS | Status: AC
  Administered 2018-02-13: 2500 mg via INTRAVENOUS
  Filled 2018-02-13: qty 2000
  Filled 2018-02-13: qty 2500

## 2018-02-13 MED ORDER — SODIUM CHLORIDE 0.9 % IV SOLN
1.0000 g | INTRAVENOUS | Status: DC
  Administered 2018-02-13 – 2018-02-14 (×2): 1 g via INTRAVENOUS
  Filled 2018-02-13 (×2): qty 1

## 2018-02-13 MED ORDER — ONDANSETRON 4 MG PO TBDP
4.0000 mg | ORAL_TABLET | Freq: Three times a day (TID) | ORAL | Status: DC | PRN
  Administered 2018-02-13 – 2018-02-14 (×2): 4 mg via ORAL
  Filled 2018-02-13 (×2): qty 1

## 2018-02-13 MED ORDER — SODIUM CHLORIDE 0.9 % IV SOLN
INTRAVENOUS | Status: AC
  Administered 2018-02-13: 02:00:00 via INTRAVENOUS

## 2018-02-13 MED ORDER — DULOXETINE HCL 60 MG PO CPEP
60.0000 mg | ORAL_CAPSULE | Freq: Every day | ORAL | Status: DC
  Administered 2018-02-13: 60 mg via ORAL
  Filled 2018-02-13: qty 1

## 2018-02-13 NOTE — Evaluation (Signed)
Clinical/Bedside Swallow Evaluation Patient Details  Name: Monica Ellis MRN: [id number] Date of Birth: [birth year]  Today's Date: 02/13/2018 Time: SLP Start Time (ACUTE ONLY): 1129 SLP Stop Time (ACUTE ONLY): 1149 SLP Time Calculation (min) (ACUTE ONLY): 20 min  Past Medical History:  Past Medical History:  Diagnosis Date  . Autoimmune hepatitis ([place])   . Diabetes mellitus without complication ([place])   . GERD (gastroesophageal reflux disease)   . Hypertension   . Lupus   . Morbid obesity ([place])   . Shortness of breath dyspnea   . Tobacco use disorder    Past Surgical History:  Past Surgical History:  Procedure Laterality Date  . CHOLECYSTECTOMY    . DILATION AND CURETTAGE OF UTERUS    . EYE SURGERY Bilateral    laser surgery    HPI:  57 yo female adm to [facility] with respiratory difficulties.  PMH + for lupus, autoimmune hepatitis, DM2, smoker, obesity and GERD.  Pt found to have left infiltrate per CXR.  She reports having the flu last week and right lobe pna - that has now transitioned to left.  Swallow eval ordered. Pt denies dysphagia.     Assessment / Plan / Recommendation Clinical Impression  Patient presents with functional oropharyngeal swallow ability based on clinical swallow evaluation.  She easily passed [place] 3 ounce water test and not focal CN deficits noted.  Swallow appeared timely with clear voice throughout trials and no indication of dysphagia.  As pt has h/o GERD, recommend continue diet with general precautions.  No SLP follow up indicated, pt educated and agreeable. Thanks.  SLP Visit Diagnosis: Dysphagia, oropharyngeal phase (R13.12)    Aspiration Risk  No limitations    Diet Recommendation Regular;Thin liquid   Liquid Administration via: Cup;Straw Medication Administration: Whole meds with liquid Supervision: Patient able to self feed Compensations: Slow rate;Small sips/bites    Other  Recommendations Oral Care Recommendations: Oral care BID    Follow up Recommendations None      Frequency and Duration n/a      Prognosis   n/a   Swallow Study   General Date of Onset: 02/13/18 HPI: 57 yo female adm to [facility] with respiratory difficulties.  PMH + for lupus, autoimmune hepatitis, DM2, smoker, obesity and GERD.  Pt found to have left infiltrate per CXR.  She reports having the flu last week and right lobe pna - that has now transitioned to left.  Swallow eval ordered. Pt denies dysphagia.   Type of Study: Bedside Swallow Evaluation Diet Prior to this Study: Regular;Thin liquids Temperature Spikes Noted: No Respiratory Status: Room air History of Recent Intubation: No Behavior/Cognition: Alert;Cooperative;Pleasant mood Oral Cavity Assessment: Within Functional Limits Oral Care Completed by SLP: No Oral Cavity - Dentition: Adequate natural dentition Vision: Functional for self-feeding Self-Feeding Abilities: Able to feed self Patient Positioning: Upright in bed Baseline Vocal Quality: Normal Volitional Cough: Strong Volitional Swallow: Able to elicit    Oral/Motor/Sensory Function Overall Oral Motor/Sensory Function: Within functional limits   Ice Chips Ice chips: Not tested   Thin Liquid Thin Liquid: Within functional limits Presentation: Cup;Straw    Nectar Thick Nectar Thick Liquid: Not tested   Honey Thick Honey Thick Liquid: Not tested   Puree Puree: Within functional limits Presentation: Self Fed;Spoon   Solid   GO   Solid: Within functional limits Presentation: Self Fredirick Lathe 02/13/2018,11:55 AM  Luanna Salk, [facility] SLP [phone]

## 2018-02-13 NOTE — Progress Notes (Signed)
Charge RN informed RN that [facility] had seen pt outside with a IV in arm. Charge RN and RN went to pt's room and informed Pt that she is not to leave the unit again. RNs educated pt that reason for her not to leave the unit is for her safety. Pt verbalized understanding and agreed to not leave the floor again. Pt in room on bed, Call bell within reach, and bed in low and locked position.

## 2018-02-13 NOTE — Progress Notes (Signed)
Monica Ar RN contacted by myself: volunteer had reported pt observed returning to [facility] lobby with "IV" in arm, per volunteer,pt said she had left but was returning to 1413, pt placed herself in wheelchair and went down hall unassisted.  Floor made aware.

## 2018-02-13 NOTE — ED Notes (Signed)
Report attempted was told nurse would call back for report.

## 2018-02-13 NOTE — H&P (Signed)
TRH H&P   Patient Demographics:    Monica Ellis, is a 57 y.o. female  MRN: [id number]   DOB - 1961/04/13  Admit Date - 02/12/2018  Outpatient Primary MD for the patient is Ferd Hibbs, NP  Referring MD/NP/PA:    Delia Heady  Outpatient Specialists:   Patient coming from: home  Chief Complaint  Patient presents with  . Dizziness      HPI:    Monica Ellis  is a 57 y.o. female, w Morbid obeisity, Hypertension,  Dm2,  Lupus, Autoimmune hepatitis, smoker presents with c/o cough for which she was treated with levaquin about 1 week ago, and this Thursday when she was not feeling better pt was seen by pcp and started on Levaquin. Pt was then started on zithromax per pt.  Still was not feeling better and having dyspnea and cough w yellow /tan sputum and therefore presented to ED for evaluation. + subjective fever.  Denies cp, palp, n/v, diarrhea, brbpr, black stool  In Ed,  CXR IMPRESSION: Left lung base densities concerning for developing infiltrate. Clinical correlation and follow-up to resolution recommended.  Na 140, K 3.9, Bun 20, Creatinine 0.95  Wbc 10.4, Hgb 12.8, Plt 268 Trop negative  Pt will be admitted for pneumonia.     Review of systems:    In addition to the HPI above, + lightheaded today No Fever-chills, No Headache, No changes with Vision or hearing, No problems swallowing food or Liquids, No Chest pain, No Abdominal pain, No Nausea or Vommitting, Bowel movements are regular, No Blood in stool or Urine, No dysuria, No new skin rashes or bruises, No new joints pains-aches,  No new weakness, tingling, numbness in any extremity, No recent weight gain or loss, No polyuria, polydypsia or polyphagia, No significant Mental Stressors.  A full 10 point Review of Systems was done, except as stated above, all other Review of Systems were  negative.   With Past History of the following :    Past Medical History:  Diagnosis Date  . Autoimmune hepatitis ([place])   . Diabetes mellitus without complication ([place])   . GERD (gastroesophageal reflux disease)   . Hypertension   . Lupus   . Morbid obesity ([place])   . Shortness of breath dyspnea   . Tobacco use disorder       Past Surgical History:  Procedure Laterality Date  . CHOLECYSTECTOMY    . DILATION AND CURETTAGE OF UTERUS    . EYE SURGERY Bilateral    laser surgery       Social History:     Social History   Tobacco Use  . Smoking status: Current Every Day Smoker    Packs/day: 1.00    Years: 30.00    Ellis years: 30.00    Types: Cigarettes  . Smokeless tobacco: Never Used  Substance Use Topics  . Alcohol use: No  Lives - at home  Mobility - walks by self   Family History :     Family History  Problem Relation Age of Onset  . Diabetes Father   . Hypertension Father   . Hypertension Mother      Home Medications:   Prior to Admission medications   Medication Sig Start Date End Date Taking? Authorizing Provider  amLODipine (NORVASC) 10 MG tablet Take 10 mg by mouth at bedtime.    Yes [provider]  azithromycin (ZITHROMAX) 250 MG tablet Take 250-500 mg by mouth See admin instructions. 500 mg now, 250 mg daily x 4 days 02/09/18  Yes [provider]  Cholecalciferol (VITAMIN D PO) Take 1 tablet by mouth every evening.    Yes [provider]  DULoxetine (CYMBALTA) 60 MG capsule Take 60 mg by mouth at bedtime.  08/26/16  Yes [provider]  insulin lispro protamine-lispro (HUMALOG 75/25 MIX) (75-25) 100 UNIT/ML SUSP injection Inject 90 Units into the skin 3 (three) times daily with meals.   Yes [provider]  LYRICA 100 MG capsule Take 100 mg by mouth at bedtime. 01/23/18  Yes [provider]  magnesium 30 MG tablet Take 30 mg by mouth at bedtime.    Yes [provider]  Multiple  Vitamin (MULTIVITAMIN) tablet Take 1 tablet by mouth at bedtime.    Yes [provider]  nadolol (CORGARD) 40 MG tablet Take 40 mg by mouth every evening.  09/16/16  Yes [provider]  Omega-3 Fatty Acids (FISH OIL PO) Take 1 capsule by mouth every evening.    Yes [provider]  omeprazole (PRILOSEC) 40 MG capsule Take 40 mg by mouth every evening.  06/03/17  Yes [provider]  ondansetron (ZOFRAN ODT) 4 MG disintegrating tablet Take 1 tablet (4 mg total) by mouth every 8 (eight) hours as needed for nausea or vomiting. 06/05/17  Yes Isla Pence, MD  Oxycodone HCl 10 MG TABS Take 10 mg by mouth every 6 (six) hours as needed (pain).   Yes [provider]  TURMERIC PO Take 400 mg by mouth every evening.    Yes [provider]  acetaminophen (TYLENOL) 325 MG tablet Take 2 tablets (650 mg total) by mouth every 4 (four) hours as needed for mild pain, fever or headache. Patient not taking: Reported on 11/23/2017 06/09/17   Robbie Lis, MD  fluticasone [facility]) 50 MCG/ACT nasal spray Place 1 spray into both nostrils daily. Patient not taking: Reported on 02/12/2018 02/11/17   Monico Blitz, MD  ursodiol (ACTIGALL) 300 MG capsule Take 1 capsule (300 mg total) by mouth 3 (three) times daily. Patient not taking: Reported on 11/23/2017 06/09/17   Robbie Lis, MD     Allergies:     Allergies  Allergen Reactions  . Ace Inhibitors Swelling  . Lisinopril Swelling and Other (See Comments)    Other reaction(s): Angioedema (ALLERGY/intolerance), Face  . Tramadol Other (See Comments)    Other reaction(s): Mental Status Changes (intolerance)  . Iodinated Diagnostic Agents Swelling    When patient was in her 20's, swelled up all over after getting getting injection of contrast; did not have any other symptoms/ was given benadryl after that happened and had no further problems per patients/  . Iodine Swelling  . Janumet  [Sitagliptin-Metformin Hcl] Other (See Comments)    Continuous yeast infection  . Victoza [Liraglutide] Nausea And Vomiting     Physical Exam:   Vitals  Blood pressure (!) 142/62, pulse 70, temperature 98.6 F (37 C), temperature source Oral, resp. rate 15, height _0  (1.676 m), weight 122.5 kg (270 lb), SpO2 96 %.   1. General  lying in bed in NAD,    2. Normal affect and insight, Not Suicidal or Homicidal, Awake Alert, Oriented X 3.  3. No F.N deficits, ALL C.Nerves Intact, Strength 5/5 all 4 extremities, Sensation intact all 4 extremities, Plantars down going.  4. Ears and Eyes appear Normal, Conjunctivae clear, PERRLA. Moist Oral Mucosa.  5. Supple Neck, No JVD, No cervical lymphadenopathy appriciated, No Carotid Bruits.  6. Symmetrical Chest wall movement, Good air movement bilaterally, CTAB.  7. RRR, No Gallops, Rubs or Murmurs, No Parasternal Heave.  8. Positive Bowel Sounds, Abdomen Soft, No tenderness, No organomegaly appriciated,No rebound -guarding or rigidity.  9.  No Cyanosis, Normal Skin Turgor, No Skin Rash or Bruise.  10. Good muscle tone,  joints appear normal , no effusions, Normal ROM.  11. No Palpable Lymph Nodes in Neck or Axillae     Data Review:    CBC Recent Labs  Lab 02/12/18 2022  WBC 10.4  HGB 12.8  HCT 39.3  PLT 268  MCV 96.3  MCH 31.4  MCHC 32.6  RDW 15.0  LYMPHSABS 4.0  MONOABS 0.9  EOSABS 0.2  BASOSABS 0.0   ------------------------------------------------------------------------------------------------------------------  Chemistries  Recent Labs  Lab 02/12/18 2022  NA 140  K 3.9  CL 104  CO2 29  GLUCOSE 182*  BUN 20  CREATININE 0.95  CALCIUM 8.5*   ------------------------------------------------------------------------------------------------------------------ estimated creatinine clearance is 86.2 mL/min (by C-G formula based on SCr of 0.95  mg/dL). ------------------------------------------------------------------------------------------------------------------ No results for input(s): TSH, T4TOTAL, T3FREE, THYROIDAB in the last 72 hours.  Invalid input(s): FREET3  Coagulation profile No results for input(s): INR, PROTIME in the last 168 hours. ------------------------------------------------------------------------------------------------------------------- No results for input(s): DDIMER in the last 72 hours. -------------------------------------------------------------------------------------------------------------------  Cardiac Enzymes No results for input(s): CKMB, TROPONINI, MYOGLOBIN in the last 168 hours.  Invalid input(s): CK ------------------------------------------------------------------------------------------------------------------    Component Value Date/Time   BNP 23.8 02/11/2017 1558     ---------------------------------------------------------------------------------------------------------------  Urinalysis    Component Value Date/Time   COLORURINE YELLOW 06/07/2017 0754   APPEARANCEUR CLEAR 06/07/2017 0754   LABSPEC 1.018 06/07/2017 0754   PHURINE 5.0 06/07/2017 0754   GLUCOSEU NEGATIVE 06/07/2017 0754   HGBUR SMALL (A) 06/07/2017 0754   BILIRUBINUR NEGATIVE 06/07/2017 0754   KETONESUR NEGATIVE 06/07/2017 0754   PROTEINUR 100 (A) 06/07/2017 0754   NITRITE NEGATIVE 06/07/2017 0754   LEUKOCYTESUR TRACE (A) 06/07/2017 0754    ----------------------------------------------------------------------------------------------------------------   Imaging Results:    Dg Chest 2 View  Result Date: 02/12/2018 CLINICAL DATA:  57 year old female with cough and dizziness EXAM: CHEST - 2 VIEW COMPARISON:  Chest radiograph dated 06/05/2017 FINDINGS: Patchy area of increased density at the left lung base suspicious for developing infiltrate. Clinical correlation and follow-up to resolution recommended.  Linear atelectatic changes of the lung bases and along the minor fissure. There is no pleural effusion or pneumothorax. The cardiac silhouette is within normal limits. No acute osseous pathology. IMPRESSION: Left lung base densities concerning for developing infiltrate. Clinical correlation and follow-up to resolution recommended. Electronically Signed   By: Anner Crete M.D.   On: 02/12/2018 20:14       Assessment & Plan:    Principal Problem:   Dyspnea Active Problems:   Dizziness    Dyspnea Secondary to CAP Blood culture x2 Sputum gram  stain culture Urine strep antigen, Urine legionella antigen Vanco iv pharmacy to dose , rocephin 1gm iv qday, zithromax 515m iv qday   Dizziness likely secondary to hypoxia/ pneumonia Resolved per pt.   Hypertension Cont amlodipine 13mpo qday  Autoimmune hepatitis Cont Nadolol Cont Ursodiol  Dm2 fsbs ac and qhs, ISS  Diabetic neuropathy Cont Lyrica      DVT Prophylaxis Lovenox - SCDs   AM Labs Ordered, also please review Full Orders  Family Communication: Admission, patients condition and plan of care including tests being ordered have been discussed with the patient who indicate understanding and agree with the plan and Code Status.  Code Status FULL CODE  Likely DC to  home  Condition GUARDED   Consults called:   none  Admission status: inpatient  Time spent in minutes : 45   JaJani Gravel.D on 02/13/2018 at 12:10 AM  Between 7am to 7pm - Pager - [phone] After 7pm go to [email or web address] - password [facility] - Office  [phone]

## 2018-02-13 NOTE — Progress Notes (Signed)
Monica Ellis  is a 57 y.o. female, w Morbid obeisity, Hypertension,  Dm2,  Lupus, Autoimmune hepatitis, smoker presents with c/o cough for which she was treated with levaquin about 1 week ago, but comes in this am for persistent symptoms. CXR shows left sided pneumonia.   Plan:  IV antibiotics,  Get oob and ambulate. And possible d/c in am if improved.    Jeoffrey Massed [name],MD [phone]

## 2018-02-13 NOTE — Care Management Obs Status (Signed)
[place] NOTIFICATION   Patient Details  Name: Monica Ellis MRN: [id number] Date of Birth: [birth year]   Medicare Observation Status Notification Given:  Yes    MahabirJuliann Pulse, RN 02/13/2018, 3:50 PM

## 2018-02-13 NOTE — ED Notes (Signed)
ED TO INPATIENT HANDOFF REPORT  Name/Age/Gender Monica Ellis 57 y.o. female  Code Status Code Status History    Date Active Date Inactive Code Status Order ID Comments User Context   06/07/2017 11:29 06/09/2017 16:26 Full Code [id number]  Reubin Milan, MD Inpatient   12/29/2015 15:01 12/31/2015 17:43 Full Code [id number]  Willia Craze, NP ED      Home/SNF/Other Home  Chief Complaint dizziness  Level of Care/Admitting Diagnosis ED Disposition    ED Disposition Condition [place]: [facility] [[id number]]  Level of Care: Telemetry [5]  Admit to tele based on following criteria: Other see comments  Comments: dizziness  Diagnosis: Dizziness [037048]  Admitting Physician: Jani Gravel [[id number]]  Attending Physician: Jani Gravel [[id number]]  PT Class (Do Not Modify): Observation [104]  PT Acc Code (Do Not Modify): Observation [10022]       Medical History Past Medical History:  Diagnosis Date  . Autoimmune hepatitis ([place])   . Diabetes mellitus without complication ([place])   . GERD (gastroesophageal reflux disease)   . Hypertension   . Lupus   . Morbid obesity ([place])   . Shortness of breath dyspnea   . Tobacco use disorder     Allergies Allergies  Allergen Reactions  . Ace Inhibitors Swelling  . Lisinopril Swelling and Other (See Comments)    Other reaction(s): Angioedema (ALLERGY/intolerance), Face  . Tramadol Other (See Comments)    Other reaction(s): Mental Status Changes (intolerance)  . Iodinated Diagnostic Agents Swelling    When patient was in her 20's, swelled up all over after getting getting injection of contrast; did not have any other symptoms/ was given benadryl after that happened and had no further problems per patients/  . Iodine Swelling  . Janumet [Sitagliptin-Metformin Hcl] Other (See Comments)    Continuous yeast infection  . Victoza [Liraglutide] Nausea And Vomiting    IV Location/Drains/Wounds Patient  Lines/Drains/Airways Status   Active Line/Drains/Airways    Name:   Placement date:   Placement time:   Site:   Days:   Peripheral IV 02/12/18 Left Antecubital   02/12/18    1906    Antecubital   1          Labs/Imaging Results for orders placed or performed during the hospital encounter of 02/12/18 (from the past 48 hour(s))  Basic metabolic panel     Status: Abnormal   Collection Time: 02/12/18  8:22 PM  Result Value Ref Range   Sodium 140 135 - 145 mmol/L   Potassium 3.9 3.5 - 5.1 mmol/L   Chloride 104 101 - 111 mmol/L   CO2 29 22 - 32 mmol/L   Glucose, Bld 182 (H) 65 - 99 mg/dL   BUN 20 6 - 20 mg/dL   Creatinine, Ser 0.95 0.44 - 1.00 mg/dL   Calcium 8.5 (L) 8.9 - 10.3 mg/dL   GFR calc non Af Amer >60 >60 mL/min   GFR calc Af Amer >60 >60 mL/min    Comment: (NOTE) The eGFR has been calculated using the CKD EPI equation. This calculation has not been validated in all clinical situations. eGFR's persistently <60 mL/min signify possible Chronic Kidney Disease.    Anion gap 7 5 - 15    Comment: Performed at [facility], [facility] [address], [place], [place] [address]  CBC with Differential     Status: None   Collection Time: 02/12/18  8:22 PM  Result Value Ref Range  WBC 10.4 4.0 - 10.5 K/uL   RBC 4.08 3.87 - 5.11 MIL/uL   Hemoglobin 12.8 12.0 - 15.0 g/dL   HCT 39.3 36.0 - 46.0 %   MCV 96.3 78.0 - 100.0 fL   MCH 31.4 26.0 - 34.0 pg   MCHC 32.6 30.0 - 36.0 g/dL   RDW 15.0 11.5 - 15.5 %   Platelets 268 150 - 400 K/uL   Neutrophils Relative % 52 %   Neutro Abs 5.4 1.7 - 7.7 K/uL   Lymphocytes Relative 38 %   Lymphs Abs 4.0 0.7 - 4.0 K/uL   Monocytes Relative 9 %   Monocytes Absolute 0.9 0.1 - 1.0 K/uL   Eosinophils Relative 1 %   Eosinophils Absolute 0.2 0.0 - 0.7 K/uL   Basophils Relative 0 %   Basophils Absolute 0.0 0.0 - 0.1 K/uL    Comment: Performed at [facility], [place] [address] [place], [place] [address]  I-stat troponin,  ED     Status: None   Collection Time: 02/12/18  8:27 PM  Result Value Ref Range   Troponin i, poc 0.00 0.00 - 0.08 ng/mL   Comment 3            Comment: Due to the release kinetics of cTnI, a negative result within the first hours of the onset of symptoms does not rule out myocardial infarction with certainty. If myocardial infarction is still suspected, repeat the test at appropriate intervals.    Dg Chest 2 View  Result Date: 02/12/2018 CLINICAL DATA:  57 year old female with cough and dizziness EXAM: CHEST - 2 VIEW COMPARISON:  Chest radiograph dated 06/05/2017 FINDINGS: Patchy area of increased density at the left lung base suspicious for developing infiltrate. Clinical correlation and follow-up to resolution recommended. Linear atelectatic changes of the lung bases and along the minor fissure. There is no pleural effusion or pneumothorax. The cardiac silhouette is within normal limits. No acute osseous pathology. IMPRESSION: Left lung base densities concerning for developing infiltrate. Clinical correlation and follow-up to resolution recommended. Electronically Signed   By: Anner Crete M.D.   On: 02/12/2018 20:14    Pending Labs Unresulted Labs (From admission, onward)   None      Vitals/Pain Today's Vitals   02/12/18 2100 02/12/18 2125 02/12/18 2253 02/12/18 2334  BP: (!) 141/65 (!) 141/65 (!) 116/58 (!) 142/62  Pulse: 64 64 65 70  Resp: _0 Temp:      TempSrc:      SpO2: 96% 95% 94% 96%  Weight:      Height:        Isolation Precautions No active isolations  Medications Medications  sodium chloride 0.9 % bolus 1,000 mL (0 mLs Intravenous Stopped 02/12/18 2330)  benzonatate (TESSALON) capsule 200 mg (200 mg Oral Given 02/12/18 2140)    Mobility walks with device

## 2018-02-13 NOTE — Progress Notes (Signed)
Pharmacy Antibiotic Note  Monica Ellis is a 57 y.o. female with hx lupus and autoimmune hepatitis, recently diagnosed with influ/PNA on z-pack PTA, presented to the ED on 02/12/2018 with c/o dizziness.  To start vancomycin for PNA.  - 3/10 CXR: Left lung base densities concerning for developing infiltrate - scr 0.95 (crcl~73N)   Plan: - vancomycin 2500 mg IV x1, then 1500 mg IV q24h for est AUC 493 - azithromycin 500 mg IV q24h and ceftriaxone 1gm IV q24h for MD  ___________________________________  Height: _0  (167.6 cm) Weight: 270 lb (122.5 kg) IBW/kg (Calculated) : 59.3  Temp (24hrs), Avg:98.6 F (37 C), Min:98.6 F (37 C), Max:98.6 F (37 C)  Recent Labs  Lab 02/12/18 2022  WBC 10.4  CREATININE 0.95    Estimated Creatinine Clearance: 86.2 mL/min (by C-G formula based on SCr of 0.95 mg/dL).    Allergies  Allergen Reactions  . Ace Inhibitors Swelling  . Lisinopril Swelling and Other (See Comments)    Other reaction(s): Angioedema (ALLERGY/intolerance), Face  . Tramadol Other (See Comments)    Other reaction(s): Mental Status Changes (intolerance)  . Iodinated Diagnostic Agents Swelling    When patient was in her 20's, swelled up all over after getting getting injection of contrast; did not have any other symptoms/ was given benadryl after that happened and had no further problems per patients/  . Iodine Swelling  . Janumet [Sitagliptin-Metformin Hcl] Other (See Comments)    Continuous yeast infection  . Victoza [Liraglutide] Nausea And Vomiting     Thank you for allowing pharmacy to be a part of this patient's care.  Lynelle Doctor 02/13/2018 12:40 AM

## 2018-02-14 DIAGNOSIS — R0600 Dyspnea, unspecified: Secondary | ICD-10-CM | POA: Diagnosis not present

## 2018-02-14 DIAGNOSIS — J181 Lobar pneumonia, unspecified organism: Secondary | ICD-10-CM | POA: Diagnosis not present

## 2018-02-14 LAB — GLUCOSE, CAPILLARY
GLUCOSE-CAPILLARY: 143 mg/dL — AB (ref 65–99)
Glucose-Capillary: 146 mg/dL — ABNORMAL HIGH (ref 65–99)
Glucose-Capillary: 149 mg/dL — ABNORMAL HIGH (ref 65–99)

## 2018-02-14 LAB — LEGIONELLA PNEUMOPHILA SEROGP 1 UR AG: L. PNEUMOPHILA SEROGP 1 UR AG: NEGATIVE

## 2018-02-14 MED ORDER — AZITHROMYCIN 250 MG PO TABS: 500.0000 mg | ORAL_TABLET | Freq: Every day | ORAL | 0 refills | Status: AC

## 2018-02-14 MED ORDER — HYDROCODONE-HOMATROPINE 5-1.5 MG/5ML PO SYRP: 5.0000 mL | ORAL_SOLUTION | Freq: Four times a day (QID) | ORAL | 0 refills | Status: DC | PRN

## 2018-02-14 MED ORDER — SODIUM CHLORIDE 0.9 % IV BOLUS (SEPSIS): 500.0000 mL | Freq: Once | INTRAVENOUS | Status: DC

## 2018-02-14 MED ORDER — CEFDINIR 300 MG PO CAPS: 300.0000 mg | ORAL_CAPSULE | Freq: Two times a day (BID) | ORAL | 0 refills | Status: AC

## 2018-02-14 NOTE — Progress Notes (Signed)
SATURATION QUALIFICATIONS: (This note is used to comply with regulatory documentation for home oxygen)  Patient Saturations on Room Air at Rest = 96 %  Patient Saturations on Room Air while Ambulating =94%    Please briefly explain why patient needs home oxygen:

## 2018-02-14 NOTE — Care Management Note (Signed)
Case Management Note  Patient Details  Name: Monica Ellis MRN: [id number] Date of Birth: Feb 26, 1961  Subjective/Objective:                    Action/Plan:d/c home.   Expected Discharge Date:  02/14/18               Expected Discharge Plan:  Home/Self Care  In-House Referral:     Discharge planning Services  CM Consult  Post Acute Care Choice:    Choice offered to:     DME Arranged:    DME Agency:     HH Arranged:    HH Agency:     Status of Service:  Completed, signed off  If discussed at [employer] of Stay Meetings, dates discussed:    Additional Comments:  Dessa Phi, RN 02/14/2018, 3:35 PM

## 2018-02-14 NOTE — Discharge Summary (Signed)
Physician Discharge Summary  Monica Ellis MRN: [id number] DOB/AGE: 1961/08/12 57 y.o.  PCP: Ferd Hibbs, NP   Admit date: 02/12/2018 Discharge date: 02/14/2018  Discharge Diagnoses:    Principal Problem:   Dyspnea Active Problems:   Dizziness   CAP (community acquired pneumonia)    Follow-up recommendations Follow-up with PCP in 3-5 days , including all  additional recommended appointments as below Follow-up CBC, CMP in 3-5 days       Allergies as of 02/14/2018      Reactions   Ace Inhibitors Swelling   Lisinopril Swelling, Other (See Comments)   Other reaction(s): Angioedema (ALLERGY/intolerance), Face   Tramadol Other (See Comments)   Other reaction(s): Mental Status Changes (intolerance)   Iodinated Diagnostic Agents Swelling   When patient was in her 20's, swelled up all over after getting getting injection of contrast; did not have any other symptoms/ was given benadryl after that happened and had no further problems per patients/   Iodine Swelling   Janumet [sitagliptin-metformin Hcl] Other (See Comments)   Continuous yeast infection   Victoza [liraglutide] Nausea And Vomiting      Medication List    TAKE these medications   acetaminophen 325 MG tablet Commonly known as:  TYLENOL Take 2 tablets (650 mg total) by mouth every 4 (four) hours as needed for mild pain, fever or headache.   amLODipine 10 MG tablet Commonly known as:  NORVASC Take 10 mg by mouth at bedtime.   azithromycin 250 MG tablet Commonly known as:  ZITHROMAX Take 2 tablets (500 mg total) by mouth daily for 5 days. 500 mg now, 250 mg daily x 4 days What changed:    how much to take  when to take this   cefdinir 300 MG capsule Commonly known as:  OMNICEF Take 1 capsule (300 mg total) by mouth 2 (two) times daily for 5 days.   DULoxetine 60 MG capsule Commonly known as:  CYMBALTA Take 60 mg by mouth at bedtime.   FISH OIL PO Take 1 capsule by mouth every evening.    fluticasone 50 MCG/ACT nasal spray Commonly known as:  FLONASE Place 1 spray into both nostrils daily.   HYDROcodone-homatropine 5-1.5 MG/5ML syrup Commonly known as:  HYCODAN Take 5 mLs by mouth every 6 (six) hours as needed for cough.   insulin lispro protamine-lispro (75-25) 100 UNIT/ML Susp injection Commonly known as:  HUMALOG 75/25 MIX Inject 90 Units into the skin 3 (three) times daily with meals.   LYRICA 100 MG capsule Generic drug:  pregabalin Take 100 mg by mouth at bedtime.   magnesium 30 MG tablet Take 30 mg by mouth at bedtime.   multivitamin tablet Take 1 tablet by mouth at bedtime.   nadolol 40 MG tablet Commonly known as:  CORGARD Take 40 mg by mouth every evening.   omeprazole 40 MG capsule Commonly known as:  PRILOSEC Take 40 mg by mouth every evening.   ondansetron 4 MG disintegrating tablet Commonly known as:  ZOFRAN ODT Take 1 tablet (4 mg total) by mouth every 8 (eight) hours as needed for nausea or vomiting.   Oxycodone HCl 10 MG Tabs Take 10 mg by mouth every 6 (six) hours as needed (pain).   TURMERIC PO Take 400 mg by mouth every evening.   ursodiol 300 MG capsule Commonly known as:  ACTIGALL Take 1 capsule (300 mg total) by mouth 3 (three) times daily.   VITAMIN D PO Take 1 tablet by mouth every evening.  Discharge Condition: * Stable   Discharge Instructions Get Medicines reviewed and adjusted: Please take all your medications with you for your next visit with your Primary MD  Please request your Primary MD to go over all hospital tests and procedure/radiological results at the follow up, please ask your Primary MD to get all Hospital records sent to his/her office.  If you experience worsening of your admission symptoms, develop shortness of breath, life threatening emergency, suicidal or homicidal thoughts you must seek medical attention immediately by calling 911 or calling your MD immediately if symptoms less  severe.  You must read complete instructions/literature along with all the possible adverse reactions/side effects for all the Medicines you take and that have been prescribed to you. Take any new Medicines after you have completely understood and accpet all the possible adverse reactions/side effects.   Do not drive when taking Pain medications.   Do not take more than prescribed Pain, Sleep and Anxiety Medications  Special Instructions: If you have smoked or chewed Tobacco in the last 2 yrs please stop smoking, stop any regular Alcohol and or any Recreational drug use.  Wear Seat belts while driving.  Please note  You were cared for by a hospitalist during your hospital stay. Once you are discharged, your primary care physician will handle any further medical issues. Please note that NO REFILLS for any discharge medications will be authorized once you are discharged, as it is imperative that you return to your primary care physician (or establish a relationship with a primary care physician if you do not have one) for your aftercare needs so that they can reassess your need for medications and monitor your lab values.     Allergies  Allergen Reactions  . Ace Inhibitors Swelling  . Lisinopril Swelling and Other (See Comments)    Other reaction(s): Angioedema (ALLERGY/intolerance), Face  . Tramadol Other (See Comments)    Other reaction(s): Mental Status Changes (intolerance)  . Iodinated Diagnostic Agents Swelling    When patient was in her 20's, swelled up all over after getting getting injection of contrast; did not have any other symptoms/ was given benadryl after that happened and had no further problems per patients/  . Iodine Swelling  . Janumet [Sitagliptin-Metformin Hcl] Other (See Comments)    Continuous yeast infection  . Victoza [Liraglutide] Nausea And Vomiting      Disposition: 01-Home or Self Care   Consults:   None   Significant Diagnostic Studies:  Dg  Chest 2 View  Result Date: 02/12/2018 CLINICAL DATA:  57 year old female with cough and dizziness EXAM: CHEST - 2 VIEW COMPARISON:  Chest radiograph dated 06/05/2017 FINDINGS: Patchy area of increased density at the left lung base suspicious for developing infiltrate. Clinical correlation and follow-up to resolution recommended. Linear atelectatic changes of the lung bases and along the minor fissure. There is no pleural effusion or pneumothorax. The cardiac silhouette is within normal limits. No acute osseous pathology. IMPRESSION: Left lung base densities concerning for developing infiltrate. Clinical correlation and follow-up to resolution recommended. Electronically Signed   By: Anner Crete M.D.   On: 02/12/2018 20:14        Filed Weights   02/12/18 1914 02/13/18 0046  Weight: 122.5 kg (270 lb) 123.8 kg (272 lb 14.9 oz)     Microbiology: Recent Results (from the past 240 hour(s))  Culture, blood (routine x 2) Call MD if unable to obtain prior to antibiotics being given     Status: None (Preliminary result)  Collection Time: 02/13/18  1:18 AM  Result Value Ref Range Status   Specimen Description   Final    RIGHT ANTECUBITAL Performed at [facility] [address], [place], [place] [address]    Special Requests   Final    BOTTLES DRAWN AEROBIC AND ANAEROBIC Blood Culture adequate volume Performed at [facility] [address], [place], [place] [address]    Culture   Final    NO GROWTH 1 DAY Performed at [facility], [place] [address] [place], [place] [address]    Report Status PENDING  Incomplete  Culture, blood (routine x 2) Call MD if unable to obtain prior to antibiotics being given     Status: None (Preliminary result)   Collection Time: 02/13/18  1:30 AM  Result Value Ref Range Status   Specimen Description   Final    BLOOD LEFT HAND Performed at [facility] [address], [place], [place]  [address]    Special Requests   Final    BOTTLES DRAWN AEROBIC AND ANAEROBIC Blood Culture adequate volume Performed at [facility] [address] [place], [place] [address]    Culture   Final    NO GROWTH 1 DAY Performed at [facility], [place] [address] [place], [place] [address]    Report Status PENDING  Incomplete  Culture, sputum-assessment     Status: None   Collection Time: 02/13/18  5:41 AM  Result Value Ref Range Status   Specimen Description SPUTUM  Final   Special Requests Normal  Final   Sputum evaluation   Final    THIS SPECIMEN IS ACCEPTABLE FOR SPUTUM CULTURE Performed at [facility], [place] [address], [place], [place] [address]    Report Status 02/13/2018 FINAL  Final  Culture, respiratory (NON-Expectorated)     Status: None (Preliminary result)   Collection Time: 02/13/18  5:41 AM  Result Value Ref Range Status   Specimen Description   Final    SPUTUM Performed at [facility] [address] [place], [place] [address]    Special Requests   Final    Normal Reflexed from M919 Performed at [facility], [place] [address], [place], [place] [address]    Gram Stain   Final    FEW WBC PRESENT,BOTH PMN AND MONONUCLEAR MODERATE SQUAMOUS EPITHELIAL CELLS PRESENT MODERATE GRAM POSITIVE COCCI IN PAIRS MODERATE GRAM NEGATIVE RODS FEW GRAM POSITIVE RODS    Culture   Final    CULTURE REINCUBATED FOR BETTER GROWTH Performed at [facility], [place] [address], [place], [place] [address]    Report Status PENDING  Incomplete  MRSA PCR Screening     Status: None   Collection Time: 02/13/18  9:43 AM  Result Value Ref Range Status   MRSA by PCR NEGATIVE NEGATIVE Final    Comment:        The GeneXpert MRSA Assay (FDA approved for NASAL specimens only), is one component of a comprehensive MRSA colonization surveillance program. It is not intended to diagnose MRSA infection nor to guide  or monitor treatment for MRSA infections. Performed at [facility], [place] [address], [place],  [address]        Blood Culture    Component Value Date/Time   SDES SPUTUM 02/13/2018 0541   SDES  02/13/2018 0541    SPUTUM Performed at [facility], [place] [address]  [place], [place] [address]    [place] Normal 02/13/2018 0541   SPECREQUEST  02/13/2018 0541    Normal Reflexed from M919 Performed at [facility], [place] [address], [place], [place] [address]    CULT  02/13/2018 0541    CULTURE REINCUBATED FOR BETTER GROWTH Performed at [facility], [place] [address] [place], [place] [address]    REPTSTATUS 02/13/2018 FINAL 02/13/2018 0541   REPTSTATUS PENDING 02/13/2018 0541      Labs: Results for orders placed or performed during the hospital encounter of 02/12/18 (from the past 48 hour(s))  Basic metabolic panel     Status: Abnormal   Collection Time: 02/12/18  8:22 PM  Result Value Ref Range   Sodium 140 135 - 145 mmol/L   Potassium 3.9 3.5 - 5.1 mmol/L   Chloride 104 101 - 111 mmol/L   CO2 29 22 - 32 mmol/L   Glucose, Bld 182 (H) 65 - 99 mg/dL   BUN 20 6 - 20 mg/dL   Creatinine, Ser 0.95 0.44 - 1.00 mg/dL   Calcium 8.5 (L) 8.9 - 10.3 mg/dL   GFR calc non Af Amer >60 >60 mL/min   GFR calc Af Amer >60 >60 mL/min    Comment: (NOTE) The eGFR has been calculated using the CKD EPI equation. This calculation has not been validated in all clinical situations. eGFR's persistently <60 mL/min signify possible Chronic Kidney Disease.    Anion gap 7 5 - 15    Comment: Performed at [facility], [place] [address], [place], [place] [address]  CBC with Differential     Status: None   Collection Time: 02/12/18  8:22 PM  Result Value Ref Range   WBC 10.4 4.0 - 10.5 K/uL   RBC 4.08 3.87 - 5.11 MIL/uL   Hemoglobin 12.8 12.0 - 15.0 g/dL   HCT 39.3 36.0 - 46.0 %   MCV 96.3 78.0 - 100.0 fL   MCH  31.4 26.0 - 34.0 pg   MCHC 32.6 30.0 - 36.0 g/dL   RDW 15.0 11.5 - 15.5 %   Platelets 268 150 - 400 K/uL   Neutrophils Relative % 52 %   Neutro Abs 5.4 1.7 - 7.7 K/uL   Lymphocytes Relative 38 %   Lymphs Abs 4.0 0.7 - 4.0 K/uL   Monocytes Relative 9 %   Monocytes Absolute 0.9 0.1 - 1.0 K/uL   Eosinophils Relative 1 %   Eosinophils Absolute 0.2 0.0 - 0.7 K/uL   Basophils Relative 0 %   Basophils Absolute 0.0 0.0 - 0.1 K/uL    Comment: Performed at [facility], [place] [address], [place], [place] [address]  I-stat troponin, ED     Status: None   Collection Time: 02/12/18  8:27 PM  Result Value Ref Range   Troponin i, poc 0.00 0.00 - 0.08 ng/mL   Comment 3            Comment: Due to the release kinetics of cTnI, a negative result within the first hours of the onset of symptoms does not rule out myocardial infarction with certainty. If myocardial infarction is still suspected, repeat the test at appropriate intervals.   HIV antibody     Status: None   Collection Time: 02/13/18  1:18 AM  Result Value Ref Range   HIV Screen 4th Generation wRfx Non Reactive Non Reactive    Comment: (NOTE) Performed At: [facility] [address] [place], [place] [id number] Rush Farmer  MD [id number] Performed at [facility], [place] [address] [place], [place] [address]   Culture, blood (routine x 2) Call MD if unable to obtain prior to antibiotics being given     Status: None (Preliminary result)   Collection Time: 02/13/18  1:18 AM  Result Value Ref Range   Specimen Description      RIGHT ANTECUBITAL Performed at [facility], [place] [address] [place], [place] [address]    Special Requests      BOTTLES DRAWN AEROBIC AND ANAEROBIC Blood Culture adequate volume Performed at [facility] [address], [place], [place] [address]    Culture      NO GROWTH 1 DAY Performed at [facility]  [address], [place], [place] [address]    Report Status PENDING   CBC     Status: None   Collection Time: 02/13/18  1:19 AM  Result Value Ref Range   WBC 9.5 4.0 - 10.5 K/uL   RBC 4.15 3.87 - 5.11 MIL/uL   Hemoglobin 12.9 12.0 - 15.0 g/dL   HCT 40.1 36.0 - 46.0 %   MCV 96.6 78.0 - 100.0 fL   MCH 31.1 26.0 - 34.0 pg   MCHC 32.2 30.0 - 36.0 g/dL   RDW 15.0 11.5 - 15.5 %   Platelets 257 150 - 400 K/uL    Comment: Performed at [facility], [place] [address], [place], [place] [address]  Comprehensive metabolic panel     Status: Abnormal   Collection Time: 02/13/18  1:19 AM  Result Value Ref Range   Sodium 140 135 - 145 mmol/L   Potassium 3.7 3.5 - 5.1 mmol/L   Chloride 105 101 - 111 mmol/L   CO2 29 22 - 32 mmol/L   Glucose, Bld 158 (H) 65 - 99 mg/dL   BUN 17 6 - 20 mg/dL   Creatinine, Ser 0.74 0.44 - 1.00 mg/dL   Calcium 8.5 (L) 8.9 - 10.3 mg/dL   Total Protein 9.2 (H) 6.5 - 8.1 g/dL   Albumin 3.1 (L) 3.5 - 5.0 g/dL   AST 72 (H) 15 - 41 U/L   ALT 112 (H) 14 - 54 U/L   Alkaline Phosphatase 62 38 - 126 U/L   Total Bilirubin 0.7 0.3 - 1.2 mg/dL   GFR calc non Af Amer >60 >60 mL/min   GFR calc Af Amer >60 >60 mL/min    Comment: (NOTE) The eGFR has been calculated using the CKD EPI equation. This calculation has not been validated in all clinical situations. eGFR's persistently <60 mL/min signify possible Chronic Kidney Disease.    Anion gap 6 5 - 15    Comment: Performed at [facility], [place] [address], [place], [place] [address]  Culture, blood (routine x 2) Call MD if unable to obtain prior to antibiotics being given     Status: None (Preliminary result)   Collection Time: 02/13/18  1:30 AM  Result Value Ref Range   Specimen Description      BLOOD LEFT HAND Performed at [facility] [address], [place], [place] [address]    Special Requests      BOTTLES DRAWN AEROBIC AND ANAEROBIC Blood Culture adequate volume Performed  at [facility] [address], [place],  [address]    Culture      NO GROWTH 1 DAY Performed at [facility] [address], [place], [place]  [address]    Report Status PENDING   Glucose, capillary     Status: Abnormal   Collection Time: 02/13/18  2:09 AM  Result Value Ref Range   Glucose-Capillary 179 (H) 65 - 99 mg/dL  Strep pneumoniae urinary antigen     Status: None   Collection Time: 02/13/18  5:23 AM  Result Value Ref Range   Strep Pneumo Urinary Antigen NEGATIVE NEGATIVE    Comment:        Infection due to S. pneumoniae cannot be absolutely ruled out since the antigen present may be below the detection limit of the test. Performed at [facility], [place] [address], [place], [place] [address]   Legionella Pneumophila Serogp 1 Ur Ag     Status: None   Collection Time: 02/13/18  5:23 AM  Result Value Ref Range   L. pneumophila Serogp 1 Ur Ag Negative Negative    Comment: (NOTE) Presumptive negative for L. pneumophila serogroup 1 antigen in urine, suggesting no recent or current infection. Legionnaires' disease cannot be ruled out since other serogroups and species may also cause disease. Performed At: [facility] [place], [place] [id number] Rush Farmer MD [id number]    Source of Sample URINE, CLEAN CATCH     Comment: Performed at [facility], [place] [address], [place], [place] [address]  Culture, sputum-assessment     Status: None   Collection Time: 02/13/18  5:41 AM  Result Value Ref Range   Specimen Description SPUTUM    Special Requests Normal    Sputum evaluation      THIS SPECIMEN IS ACCEPTABLE FOR SPUTUM CULTURE Performed at [facility], [place] [address], [place], [place] [address]    Report Status 02/13/2018 FINAL   Culture, respiratory (NON-Expectorated)     Status: None (Preliminary result)   Collection Time: 02/13/18  5:41 AM  Result Value Ref Range    Specimen Description      SPUTUM Performed at [facility] [address] [place], [place] [address]    Special Requests      Normal Reflexed from M919 Performed at [facility], [place] [address], [place], [place] [address]    Gram Stain      FEW WBC PRESENT,BOTH PMN AND MONONUCLEAR MODERATE SQUAMOUS EPITHELIAL CELLS PRESENT MODERATE GRAM POSITIVE COCCI IN PAIRS MODERATE GRAM NEGATIVE RODS FEW GRAM POSITIVE RODS    Culture      CULTURE REINCUBATED FOR BETTER GROWTH Performed at [facility], [place] [address], [place], [place] [address]    Report Status PENDING   MRSA PCR Screening     Status: None   Collection Time: 02/13/18  9:43 AM  Result Value Ref Range   MRSA by PCR NEGATIVE NEGATIVE    Comment:        The GeneXpert MRSA Assay (FDA approved for NASAL specimens only), is one component of a comprehensive MRSA colonization surveillance program. It is not intended to diagnose MRSA infection nor to guide or monitor treatment for MRSA infections. Performed at [facility], [place] [address], [place], [place] [address]   Glucose, capillary     Status: Abnormal   Collection Time: 02/13/18 12:31 PM  Result Value Ref Range   Glucose-Capillary 172 (H) 65 - 99 mg/dL  Glucose, capillary     Status: Abnormal   Collection Time: 02/13/18  5:19 PM  Result Value Ref Range   Glucose-Capillary 126 (H) 65 -  99 mg/dL  Glucose, capillary     Status: Abnormal   Collection Time: 02/13/18  8:54 PM  Result Value Ref Range   Glucose-Capillary 126 (H) 65 - 99 mg/dL  Glucose, capillary     Status: Abnormal   Collection Time: 02/14/18  7:51 AM  Result Value Ref Range   Glucose-Capillary 146 (H) 65 - 99 mg/dL  Glucose, capillary     Status: Abnormal   Collection Time: 02/14/18 11:33 AM  Result Value Ref Range   Glucose-Capillary 143 (H) 65 - 99 mg/dL     Lipid Panel  No results found for: CHOL, TRIG, HDL, CHOLHDL, VLDL,  LDLCALC, LDLDIRECT   Lab Results  Component Value Date   HGBA1C 10.0 (H) 12/29/2015     Lab Results  Component Value Date   CREATININE 0.74 02/13/2018     HPI :   Monica Ellis  is a 57 y.o. female, w Morbid obeisity, Hypertension,  Dm2,  Lupus, Autoimmune hepatitis, smoker presents with c/o cough for which she was treated with levaquin about 1 week ago, and this Thursday when she was not feeling better pt was seen by pcp and started on Levaquin. Pt was then started on zithromax per pt.  Still was not feeling better and having dyspnea and cough w yellow /tan sputum and therefore presented to ED for evaluation. + subjective fever.   In Ed, chest x-ray showed Left lung base densities concerning for developing infiltrate.Clinical correlation and follow-up to resolution recommended.   Pt will be admitted for pneumonia.    HOSPITAL COURSE  Community-acquired pneumonia Patient started on treatment with Rocephin/ azithromycin, now switched to [facility] and azithromycin for another 5 days Sputum culture pending, blood culture no growth so far, Urine streptococcal antigen and legionella negative Oxygen saturation stable prior to discharge  Autoimmune hepatitis Continue nadolol and ursodiol  Diabetes mellitus Resume home regimen  Diabetic neuropathy continue Lyrica  Hypertension continue Norvasc  Discharge Exam:   Blood pressure (!) 139/59, pulse 64, temperature 98.9 F (37.2 C), temperature source Oral, resp. rate 20, height 5' 6" (1.676 m), weight 123.8 kg (272 lb 14.9 oz), SpO2 94 %.  Cardiovascular: Normal rate, regular rhythm, normal heart sounds and intact distal pulses. Exam reveals no gallop and no friction rub.  No murmur heard. Pulmonary/Chest: Effort normal and breath sounds normal. No respiratory distress.  Abdominal: Soft. Bowel sounds are normal. She exhibits no distension. There is no tenderness. There is no guarding.  Musculoskeletal: Normal range of  motion. She exhibits no edema.  Neurological: She is alert and oriented to person, place, and time. No cranial nerve deficit or sensory deficit. She exhibits normal muscle tone. Coordination normal.         Signed: Reyne Dumas 02/14/2018, 2:35 PM        Time spent >1 hour

## 2018-02-15 LAB — CULTURE, RESPIRATORY: CULTURE: NORMAL

## 2018-02-15 LAB — CULTURE, RESPIRATORY W GRAM STAIN: Special Requests: NORMAL

## 2018-02-18 LAB — CULTURE, BLOOD (ROUTINE X 2)
Culture: NO GROWTH
Culture: NO GROWTH
SPECIAL REQUESTS: ADEQUATE
SPECIAL REQUESTS: ADEQUATE

## 2018-02-27 ENCOUNTER — Telehealth: Payer: Self-pay | Admitting: Neurology

## 2018-02-27 NOTE — Telephone Encounter (Signed)
3/25 called pt to confirm but noticed an appt mix up. There are 2 New Patient Appt dates in the chart. LVM for pt to call us back so that we can confirm which appt is the actual appt date.

## 2018-03-01 ENCOUNTER — Ambulatory Visit: Payer: Medicare Other | Admitting: Neurology

## 2018-04-03 ENCOUNTER — Ambulatory Visit: Payer: Medicare Other | Admitting: Neurology

## 2018-04-03 ENCOUNTER — Encounter: Payer: Self-pay | Admitting: Neurology

## 2018-06-05 ENCOUNTER — Ambulatory Visit: Payer: Medicare Other | Admitting: Diagnostic Neuroimaging

## 2018-10-11 ENCOUNTER — Other Ambulatory Visit: Payer: Self-pay | Admitting: Nurse Practitioner

## 2018-10-11 DIAGNOSIS — K7469 Other cirrhosis of liver: Secondary | ICD-10-CM

## 2018-10-18 ENCOUNTER — Ambulatory Visit
Admission: RE | Admit: 2018-10-18 | Discharge: 2018-10-18 | Disposition: A | Discharge status: Home / Self Care | Payer: Medicare Other | Source: Ambulatory Visit | Attending: Nurse Practitioner | Admitting: Nurse Practitioner

## 2018-10-18 DIAGNOSIS — K7469 Other cirrhosis of liver: Secondary | ICD-10-CM

## 2018-10-19 ENCOUNTER — Other Ambulatory Visit (HOSPITAL_COMMUNITY): Payer: Self-pay | Admitting: Nurse Practitioner

## 2018-10-19 DIAGNOSIS — K7460 Unspecified cirrhosis of liver: Secondary | ICD-10-CM

## 2018-10-19 DIAGNOSIS — K754 Autoimmune hepatitis: Secondary | ICD-10-CM

## 2018-10-26 ENCOUNTER — Other Ambulatory Visit: Payer: Self-pay | Admitting: Radiology

## 2018-10-30 ENCOUNTER — Ambulatory Visit (HOSPITAL_COMMUNITY)
Admission: RE | Admit: 2018-10-30 | Discharge: 2018-10-30 | Disposition: A | Discharge status: Home / Self Care | Payer: Medicare Other | Source: Ambulatory Visit | Attending: Nurse Practitioner | Admitting: Nurse Practitioner

## 2018-10-30 ENCOUNTER — Encounter (HOSPITAL_COMMUNITY): Payer: Self-pay

## 2018-10-30 ENCOUNTER — Other Ambulatory Visit: Payer: Self-pay

## 2018-10-30 DIAGNOSIS — K754 Autoimmune hepatitis: Secondary | ICD-10-CM

## 2018-10-30 DIAGNOSIS — Z5309 Procedure and treatment not carried out because of other contraindication: Secondary | ICD-10-CM | POA: Insufficient documentation

## 2018-10-30 DIAGNOSIS — Z91041 Radiographic dye allergy status: Secondary | ICD-10-CM | POA: Insufficient documentation

## 2018-10-30 DIAGNOSIS — K7460 Unspecified cirrhosis of liver: Secondary | ICD-10-CM

## 2018-10-30 HISTORY — DX: Pneumonia, unspecified organism: J18.9

## 2018-10-30 LAB — CBC WITH DIFFERENTIAL/PLATELET
Abs Immature Granulocytes: 0.06 10*3/uL (ref 0.00–0.07)
Basophils Absolute: 0.1 10*3/uL (ref 0.0–0.1)
Basophils Relative: 1 %
EOS PCT: 2 %
Eosinophils Absolute: 0.2 10*3/uL (ref 0.0–0.5)
HEMATOCRIT: 44.4 % (ref 36.0–46.0)
HEMOGLOBIN: 14.5 g/dL (ref 12.0–15.0)
Immature Granulocytes: 1 %
LYMPHS ABS: 4.5 10*3/uL — AB (ref 0.7–4.0)
LYMPHS PCT: 40 %
MCH: 31.7 pg (ref 26.0–34.0)
MCHC: 32.7 g/dL (ref 30.0–36.0)
MCV: 97.2 fL (ref 80.0–100.0)
MONO ABS: 1 10*3/uL (ref 0.1–1.0)
Monocytes Relative: 9 %
Neutro Abs: 5.3 10*3/uL (ref 1.7–7.7)
Neutrophils Relative %: 47 %
Platelets: 197 10*3/uL (ref 150–400)
RBC: 4.57 MIL/uL (ref 3.87–5.11)
RDW: 15.1 % (ref 11.5–15.5)
WBC: 11 10*3/uL — ABNORMAL HIGH (ref 4.0–10.5)
nRBC: 0 % (ref 0.0–0.2)

## 2018-10-30 LAB — COMPREHENSIVE METABOLIC PANEL
ALT: 243 U/L — ABNORMAL HIGH (ref 0–44)
ANION GAP: 8 (ref 5–15)
AST: 136 U/L — ABNORMAL HIGH (ref 15–41)
Albumin: 3.9 g/dL (ref 3.5–5.0)
Alkaline Phosphatase: 50 U/L (ref 38–126)
BILIRUBIN TOTAL: 1 mg/dL (ref 0.3–1.2)
BUN: 15 mg/dL (ref 6–20)
CO2: 26 mmol/L (ref 22–32)
Calcium: 9 mg/dL (ref 8.9–10.3)
Chloride: 103 mmol/L (ref 98–111)
Creatinine, Ser: 0.72 mg/dL (ref 0.44–1.00)
GFR calc non Af Amer: >60 mL/min (ref >60)
Glucose, Bld: 115 mg/dL — ABNORMAL HIGH (ref 70–99)
Potassium: 3.2 mmol/L — ABNORMAL LOW (ref 3.5–5.1)
Sodium: 137 mmol/L (ref 135–145)
TOTAL PROTEIN: 10 g/dL — AB (ref 6.5–8.1)

## 2018-10-30 LAB — GLUCOSE, CAPILLARY: Glucose-Capillary: 108 mg/dL — ABNORMAL HIGH (ref 70–99)

## 2018-10-30 LAB — PROTIME-INR
INR: 1.01
Prothrombin Time: 13.2 seconds (ref 11.4–15.2)

## 2018-10-30 MED ORDER — SODIUM CHLORIDE 0.9 % IV SOLN
INTRAVENOUS | Status: DC
  Administered 2018-10-30: 13:00:00 via INTRAVENOUS

## 2018-10-30 NOTE — Progress Notes (Signed)
Patient ID: Monica Ellis, female   DOB: 1961-10-02, 57 y.o.   MRN: [id number] Patient presented to radiology department today for transjugular liver biopsy.  Review of medical history reveals iodine/contrast allergy which patient states results in "swelling".  She will need to be premedicated with Benadryl and prednisone prior to above procedure. She has been rescheduled for 11/29 at [facility].  Preprocedure instructions were reviewed with patient.

## 2018-11-01 ENCOUNTER — Other Ambulatory Visit: Payer: Self-pay | Admitting: Radiology

## 2018-11-03 ENCOUNTER — Encounter (HOSPITAL_COMMUNITY): Payer: Self-pay

## 2018-11-03 ENCOUNTER — Ambulatory Visit (HOSPITAL_COMMUNITY)
Admission: RE | Admit: 2018-11-03 | Discharge: 2018-11-03 | Disposition: A | Discharge status: Home / Self Care | Payer: Medicare Other | Source: Ambulatory Visit | Attending: Nurse Practitioner | Admitting: Nurse Practitioner

## 2018-11-03 ENCOUNTER — Other Ambulatory Visit (HOSPITAL_COMMUNITY): Payer: Self-pay | Admitting: Nurse Practitioner

## 2018-11-03 DIAGNOSIS — Z794 Long term (current) use of insulin: Secondary | ICD-10-CM | POA: Diagnosis not present

## 2018-11-03 DIAGNOSIS — K219 Gastro-esophageal reflux disease without esophagitis: Secondary | ICD-10-CM | POA: Diagnosis not present

## 2018-11-03 DIAGNOSIS — I10 Essential (primary) hypertension: Secondary | ICD-10-CM | POA: Insufficient documentation

## 2018-11-03 DIAGNOSIS — F17210 Nicotine dependence, cigarettes, uncomplicated: Secondary | ICD-10-CM | POA: Diagnosis not present

## 2018-11-03 DIAGNOSIS — K7460 Unspecified cirrhosis of liver: Secondary | ICD-10-CM

## 2018-11-03 DIAGNOSIS — Z7952 Long term (current) use of systemic steroids: Secondary | ICD-10-CM | POA: Diagnosis not present

## 2018-11-03 DIAGNOSIS — Z7951 Long term (current) use of inhaled steroids: Secondary | ICD-10-CM | POA: Diagnosis not present

## 2018-11-03 DIAGNOSIS — K754 Autoimmune hepatitis: Secondary | ICD-10-CM | POA: Diagnosis not present

## 2018-11-03 DIAGNOSIS — M329 Systemic lupus erythematosus, unspecified: Secondary | ICD-10-CM | POA: Insufficient documentation

## 2018-11-03 DIAGNOSIS — Z6841 Body Mass Index (BMI) 40.0 and over, adult: Secondary | ICD-10-CM | POA: Insufficient documentation

## 2018-11-03 DIAGNOSIS — E119 Type 2 diabetes mellitus without complications: Secondary | ICD-10-CM | POA: Insufficient documentation

## 2018-11-03 HISTORY — PX: IR TRANSCATHETER BX: IMG713

## 2018-11-03 HISTORY — PX: IR VENOGRAM HEPATIC W HEMODYNAMIC EVALUATION: IMG692

## 2018-11-03 HISTORY — PX: IR US GUIDE VASC ACCESS RIGHT: IMG2390

## 2018-11-03 MED ORDER — LIDOCAINE HCL 1 % IJ SOLN
INTRAMUSCULAR | Status: AC
  Filled 2018-11-03: qty 20

## 2018-11-03 MED ORDER — MIDAZOLAM HCL 2 MG/2ML IJ SOLN
INTRAMUSCULAR | Status: AC | PRN
  Administered 2018-11-03 (×4): 1 mg via INTRAVENOUS

## 2018-11-03 MED ORDER — FENTANYL CITRATE (PF) 100 MCG/2ML IJ SOLN
INTRAMUSCULAR | Status: AC | PRN
  Administered 2018-11-03 (×2): 50 ug via INTRAVENOUS

## 2018-11-03 MED ORDER — FENTANYL CITRATE (PF) 100 MCG/2ML IJ SOLN
INTRAMUSCULAR | Status: AC
  Filled 2018-11-03: qty 2

## 2018-11-03 MED ORDER — IOPAMIDOL (ISOVUE-300) INJECTION 61%
INTRAVENOUS | Status: AC
  Administered 2018-11-03: 40 mL
  Filled 2018-11-03: qty 50

## 2018-11-03 MED ORDER — MIDAZOLAM HCL 2 MG/2ML IJ SOLN
INTRAMUSCULAR | Status: AC
  Filled 2018-11-03: qty 4

## 2018-11-03 NOTE — Consult Note (Addendum)
Chief Complaint: Patient was seen in consultation today for image guided  transjugular liver biopsy  Referring Physician(s): Drazek,Dawn  Supervising Physician: Arne Cleveland  Patient Status: [facility] - Out-pt  History of Present Illness: Monica Ellis is a 57 y.o. female with history of diabetes, hypertension, tobacco abuse, obesity, GERD, cirrhosis, elevated liver function tests, positive ASMA, ?lupus, elevated IgG who presents today for transjugular liver biopsy for further evaluation/check hepatic-portal venous pressures.  Past Medical History:  Diagnosis Date  . Autoimmune hepatitis ([place])   . Diabetes mellitus without complication ([place])   . GERD (gastroesophageal reflux disease)   . Hypertension   . Lupus ([place])   . Morbid obesity ([place])   . Pneumonia   . Shortness of breath dyspnea   . Tobacco use disorder     Past Surgical History:  Procedure Laterality Date  . CHOLECYSTECTOMY    . DILATION AND CURETTAGE OF UTERUS    . EYE SURGERY Bilateral    laser surgery     Allergies: Ace inhibitors; Lisinopril; Tramadol; Iodinated diagnostic agents; Iodine; Janumet [sitagliptin-metformin hcl]; and Victoza [liraglutide]  Medications: Prior to Admission medications   Medication Sig Start Date End Date Taking? Authorizing Provider  acetaminophen (TYLENOL) 325 MG tablet Take 2 tablets (650 mg total) by mouth every 4 (four) hours as needed for mild pain, fever or headache. Patient not taking: Reported on 11/23/2017 06/09/17   Robbie Lis, MD  amLODipine (NORVASC) 10 MG tablet Take 10 mg by mouth at bedtime.     [provider]  Cholecalciferol (VITAMIN D PO) Take 1 tablet by mouth every evening.     [provider]  DULoxetine (CYMBALTA) 60 MG capsule Take 60 mg by mouth at bedtime.  08/26/16   [provider]  fluticasone (FLONASE) 50 MCG/ACT nasal spray Place 1 spray into both nostrils daily. 02/11/17   Monico Blitz, MD    HYDROcodone-homatropine [facility]) 5-1.5 MG/5ML syrup Take 5 mLs by mouth every 6 (six) hours as needed for cough. 02/14/18   Reyne Dumas, MD  insulin lispro protamine-lispro (HUMALOG 75/25 MIX) (75-25) 100 UNIT/ML SUSP injection Inject 90 Units into the skin 3 (three) times daily with meals.    [provider]  LYRICA 100 MG capsule Take 100 mg by mouth at bedtime. 01/23/18   [provider]  magnesium 30 MG tablet Take 30 mg by mouth at bedtime.     [provider]  Multiple Vitamin (MULTIVITAMIN) tablet Take 1 tablet by mouth at bedtime.     [provider]  nadolol (CORGARD) 40 MG tablet Take 40 mg by mouth every evening.  09/16/16   [provider]  Omega-3 Fatty Acids (FISH OIL PO) Take 1 capsule by mouth every evening.     [provider]  omeprazole (PRILOSEC) 40 MG capsule Take 40 mg by mouth every evening.  06/03/17   [provider]  ondansetron (ZOFRAN ODT) 4 MG disintegrating tablet Take 1 tablet (4 mg total) by mouth every 8 (eight) hours as needed for nausea or vomiting. 06/05/17   Isla Pence, MD  Oxycodone HCl 10 MG TABS Take 10 mg by mouth every 6 (six) hours as needed (pain).    [provider]  predniSONE (DELTASONE) 5 MG tablet Take 10 mg by mouth daily.    [provider]  TURMERIC PO Take 400 mg by mouth every evening.     [provider]  ursodiol (ACTIGALL) 300 MG capsule Take 1 capsule (300 mg  total) by mouth 3 (three) times daily. Patient not taking: Reported on 11/23/2017 06/09/17   Robbie Lis, MD     Family History  Problem Relation Age of Onset  . Diabetes Father   . Hypertension Father   . Hypertension Mother     Social History   Socioeconomic History  . Marital status: Single    Spouse name: Not on file  . Number of children: Not on file  . Years of education: Not on file  . Highest education level: Not on file  Occupational History  . Not on file  Social  Needs  . Financial resource strain: Not on file  . Food insecurity:    Worry: Not on file    Inability: Not on file  . Transportation needs:    Medical: Not on file    Non-medical: Not on file  Tobacco Use  . Smoking status: Current Every Day Smoker    Packs/day: 1.00    Years: 30.00    Pack years: 30.00    Types: Cigarettes  . Smokeless tobacco: Never Used  Substance and Sexual Activity  . Alcohol use: No  . Drug use: No  . Sexual activity: Not on file  Lifestyle  . Physical activity:    Days per week: Not on file    Minutes per session: Not on file  . Stress: Not on file  Relationships  . Social connections:    Talks on phone: Not on file    Gets together: Not on file    Attends religious service: Not on file    Active member of club or organization: Not on file    Attends meetings of clubs or organizations: Not on file    Relationship status: Not on file  Other Topics Concern  . Not on file  Social History Narrative  . Not on file      Review of Systems currently denies fever, headache, chest pain, dyspnea, cough, abdominal/back pain, nausea, vomiting or bleeding.  Vital Signs:   Physical Exam awake, alert.  Chest clear to auscultation bilaterally.  Heart with regular rate and rhythm.  Abdomen obese, soft, positive bowel sounds, nontender.  Extremities with full range of motion.  Imaging: US Abdomen Limited Ruq  Result Date: 10/18/2018 CLINICAL DATA:  [place] cirrhosis EXAM: ULTRASOUND ABDOMEN LIMITED RIGHT UPPER QUADRANT COMPARISON:  None FINDINGS: Gallbladder: Surgically absent Common bile duct: Diameter: 10 mm diameter, which may be normal post cholecystectomy Liver: Heterogeneous hepatic echogenicity with nodular contour consistent with cirrhosis. No discrete hepatic mass or nodule is visualized. Portal vein is patent on color Doppler imaging with normal direction of blood flow towards the liver. No RIGHT upper quadrant free fluid. IMPRESSION: Post  cholecystectomy. Cirrhotic liver without definite mass. Electronically Signed   By: Lavonia Dana M.D.   On: 10/18/2018 13:03    Labs:  CBC: Recent Labs    02/12/18 2022 02/13/18 0119 10/30/18 1200  WBC 10.4 9.5 11.0*  HGB 12.8 12.9 14.5  HCT 39.3 40.1 44.4  PLT 268 257 197    COAGS: Recent Labs    10/30/18 1200  INR 1.01    BMP: Recent Labs    11/23/17 0956 02/12/18 2022 02/13/18 0119 10/30/18 1200  NA 133* 140 140 137  K 3.8 3.9 3.7 3.2*  CL 103 104 105 103  CO2 _0 GLUCOSE 136* 182* 158* 115*  BUN _1 CALCIUM 9.3 8.5* 8.5* 9.0  CREATININE 0.79 0.95  0.74 0.72  GFRNONAA 83 >60 >60 >60  GFRAA 96 >60 >60 >60    LIVER FUNCTION TESTS: Recent Labs    11/23/17 0956 02/13/18 0119 10/30/18 1200  BILITOT 0.7 0.7 1.0  AST 65* 72* 136*  ALT 90* 112* 243*  ALKPHOS  --  62 50  PROT 9.5* 9.2* 10.0*  ALBUMIN  --  3.1* 3.9    TUMOR MARKERS: No results for input(s): AFPTM, CEA, CA199, CHROMGRNA in the last 8760 hours.  Assessment and Plan: 57 y.o. female with history of diabetes, hypertension, tobacco abuse, obesity, GERD, cirrhosis, elevated liver function tests, positive ASMA, ?lupus, elevated IgG who presents today for transjugular liver biopsy for further evaluation/check hepatic-portal venous pressures.Risks and benefits discussed with the patient including, but not limited to bleeding, infection, damage to adjacent structures or low yield requiring additional tests.  All of the patient's questions were answered, patient is agreeable to proceed. Consent signed and in chart.      Thank you for this interesting consult.  I greatly enjoyed meeting Monica Ellis and look forward to participating in their care.  A copy of this report was sent to the requesting provider on this date.  Electronically Signed: D. Rowe Robert, PA-C 11/03/2018, 12:51 PM   I spent a total of  25 minutes   in face to face in clinical consultation, greater  than 50% of which was counseling/coordinating care for image guided  transjugular liver biopsy

## 2018-11-03 NOTE — Discharge Instructions (Signed)
Liver Biopsy, Care After °These instructions give you information on caring for yourself after your procedure. Your doctor may also give you more specific instructions. Call your doctor if you have any problems or questions after your procedure. °Follow these instructions at home: °· Rest at home for 1-2 days or as told by your doctor. °· Have someone stay with you for at least 24 hours. °· Do not do these things in the first 24 hours: °? Drive. °? Use machinery. °? Take care of other people. °? Sign legal documents. °? Take a bath or shower. °· There are many different ways to close and cover a cut (incision). For example, a cut can be closed with stitches, skin glue, or adhesive strips. Follow your doctor's instructions on: °? Taking care of your cut. °? Changing and removing your bandage (dressing). °? Removing whatever was used to close your cut. °· Do not drink alcohol in the first week. °· Do not lift more than 5 pounds or play contact sports for the first 2 weeks. °· Take medicines only as told by your doctor. For 1 week, do not take medicine that has aspirin in it or medicines like ibuprofen. °· Get your test results. °Contact a doctor if: °· A cut bleeds and leaves more than just a small spot of blood. °· A cut is red, puffs up (swells), or hurts more than before. °· Fluid or something else comes from a cut. °· A cut smells bad. °· You have a fever or chills. °Get help right away if: °· You have swelling, bloating, or pain in your belly (abdomen). °· You get dizzy or faint. °· You have a rash. °· You feel sick to your stomach (nauseous) or throw up (vomit). °· You have trouble breathing, feel short of breath, or feel faint. °· Your chest hurts. °· You have problems talking or seeing. °· You have trouble balancing or moving your arms or legs. °This information is not intended to replace advice given to you by your health care provider. Make sure you discuss any questions you have with your health care  provider. °Document Released: 08/31/2008 Document Revised: 04/29/2016 Document Reviewed: 01/18/2014 °Elsevier Interactive Patient Education © 2018 Elsevier Inc[employer] ° ° ° ° °Moderate Conscious Sedation, Adult, Care After °These instructions provide you with information about caring for yourself after your procedure. Your health care provider may also give you more specific instructions. Your treatment has been planned according to current medical practices, but problems sometimes occur. Call your health care provider if you have any problems or questions after your procedure. °What can I expect after the procedure? °After your procedure, it is common: °· To feel sleepy for several hours. °· To feel clumsy and have poor balance for several hours. °· To have poor judgment for several hours. °· To vomit if you eat too soon. ° °Follow these instructions at home: °For at least 24 hours after the procedure: ° °· Do not: °? Participate in activities where you could fall or become injured. °? Drive. °? Use heavy machinery. °? Drink alcohol. °? Take sleeping pills or medicines that cause drowsiness. °? Make important decisions or sign legal documents. °? Take care of children on your own. °· Rest. °Eating and drinking °· Follow the diet recommended by your health care provider. °· If you vomit: °? Drink water, juice, or soup when you can drink without vomiting. °? Make sure you have little or no nausea before eating solid foods. °General instructions °·   Have a responsible adult stay with you until you are awake and alert. °· Take over-the-counter and prescription medicines only as told by your health care provider. °· If you smoke, do not smoke without supervision. °· Keep all follow-up visits as told by your health care provider. This is important. °Contact a health care provider if: °· You keep feeling nauseous or you keep vomiting. °· You feel light-headed. °· You develop a rash. °· You have a fever. °Get help right away  if: °· You have trouble breathing. °This information is not intended to replace advice given to you by your health care provider. Make sure you discuss any questions you have with your health care provider. °Document Released: 09/12/2013 Document Revised: 04/26/2016 Document Reviewed: 03/13/2016 °Elsevier Interactive Patient Education © 2018 Elsevier Inc. ° °

## 2018-11-03 NOTE — Procedures (Signed)
  Procedure: IR transjugular liver core biopsy with pressures 18g x4 EBL:   minimal Complications:  none immediate  See full dictation in [facility].  Dillard Cannon MD Main # [phone] Pager  [phone]

## 2019-04-07 ENCOUNTER — Encounter (HOSPITAL_COMMUNITY): Payer: Self-pay

## 2019-04-07 ENCOUNTER — Emergency Department (HOSPITAL_COMMUNITY): Payer: Medicare Other

## 2019-04-07 ENCOUNTER — Other Ambulatory Visit: Payer: Self-pay

## 2019-04-07 ENCOUNTER — Emergency Department (HOSPITAL_COMMUNITY)
Admission: EM | Admit: 2019-04-07 | Discharge: 2019-04-07 | Disposition: A | Discharge status: Home / Self Care | Payer: Medicare Other | Attending: Emergency Medicine | Admitting: Emergency Medicine

## 2019-04-07 DIAGNOSIS — Z79899 Other long term (current) drug therapy: Secondary | ICD-10-CM | POA: Insufficient documentation

## 2019-04-07 DIAGNOSIS — R112 Nausea with vomiting, unspecified: Secondary | ICD-10-CM | POA: Insufficient documentation

## 2019-04-07 DIAGNOSIS — F17210 Nicotine dependence, cigarettes, uncomplicated: Secondary | ICD-10-CM | POA: Insufficient documentation

## 2019-04-07 DIAGNOSIS — I10 Essential (primary) hypertension: Secondary | ICD-10-CM | POA: Insufficient documentation

## 2019-04-07 DIAGNOSIS — E119 Type 2 diabetes mellitus without complications: Secondary | ICD-10-CM | POA: Diagnosis not present

## 2019-04-07 DIAGNOSIS — R109 Unspecified abdominal pain: Secondary | ICD-10-CM | POA: Diagnosis present

## 2019-04-07 LAB — COMPREHENSIVE METABOLIC PANEL
ALT: 62 U/L — ABNORMAL HIGH (ref 0–44)
AST: 42 U/L — ABNORMAL HIGH (ref 15–41)
Albumin: 3.7 g/dL (ref 3.5–5.0)
Alkaline Phosphatase: 43 U/L (ref 38–126)
Anion gap: 8 (ref 5–15)
BUN: 17 mg/dL (ref 6–20)
CO2: 29 mmol/L (ref 22–32)
Calcium: 9.2 mg/dL (ref 8.9–10.3)
Chloride: 101 mmol/L (ref 98–111)
Creatinine, Ser: 0.84 mg/dL (ref 0.44–1.00)
GFR calc Af Amer: >60 mL/min (ref >60)
GFR calc non Af Amer: >60 mL/min (ref >60)
Glucose, Bld: 142 mg/dL — ABNORMAL HIGH (ref 70–99)
Potassium: 3.2 mmol/L — ABNORMAL LOW (ref 3.5–5.1)
Sodium: 138 mmol/L (ref 135–145)
Total Bilirubin: 0.6 mg/dL (ref 0.3–1.2)
Total Protein: 9.4 g/dL — ABNORMAL HIGH (ref 6.5–8.1)

## 2019-04-07 LAB — URINALYSIS, ROUTINE W REFLEX MICROSCOPIC
Bilirubin Urine: NEGATIVE
Glucose, UA: NEGATIVE mg/dL
Hgb urine dipstick: NEGATIVE
Ketones, ur: NEGATIVE mg/dL
Leukocytes,Ua: NEGATIVE
Nitrite: NEGATIVE
Protein, ur: 100 mg/dL — AB
Specific Gravity, Urine: 1.019 (ref 1.005–1.030)
pH: 6 (ref 5.0–8.0)

## 2019-04-07 LAB — CBC
HCT: 46 % (ref 36.0–46.0)
Hemoglobin: 14.8 g/dL (ref 12.0–15.0)
MCH: 31.7 pg (ref 26.0–34.0)
MCHC: 32.2 g/dL (ref 30.0–36.0)
MCV: 98.5 fL (ref 80.0–100.0)
Platelets: 218 10*3/uL (ref 150–400)
RBC: 4.67 MIL/uL (ref 3.87–5.11)
RDW: 14.2 % (ref 11.5–15.5)
WBC: 10.1 10*3/uL (ref 4.0–10.5)
nRBC: 0 % (ref 0.0–0.2)

## 2019-04-07 LAB — LIPASE, BLOOD: Lipase: 25 U/L (ref 11–51)

## 2019-04-07 MED ORDER — HYDROMORPHONE HCL 1 MG/ML IJ SOLN
1.0000 mg | Freq: Once | INTRAMUSCULAR | Status: AC
  Administered 2019-04-07: 1 mg via INTRAVENOUS
  Filled 2019-04-07: qty 1

## 2019-04-07 MED ORDER — SODIUM CHLORIDE 0.9 % IV BOLUS
1000.0000 mL | Freq: Once | INTRAVENOUS | Status: AC
  Administered 2019-04-07: 16:00:00 1000 mL via INTRAVENOUS

## 2019-04-07 MED ORDER — ONDANSETRON HCL 4 MG/2ML IJ SOLN
4.0000 mg | Freq: Once | INTRAMUSCULAR | Status: AC
  Administered 2019-04-07: 4 mg via INTRAVENOUS
  Filled 2019-04-07: qty 2

## 2019-04-07 MED ORDER — SODIUM CHLORIDE 0.9% FLUSH
3.0000 mL | Freq: Once | INTRAVENOUS | Status: AC
  Administered 2019-04-07: 3 mL via INTRAVENOUS

## 2019-04-07 NOTE — ED Notes (Signed)
Culture sent

## 2019-04-07 NOTE — ED Triage Notes (Signed)
Per [facility]- Pt resides at home. HX of chronic nausea. Sudden onset of Stomach pain RT side x 2 hours. HX of Lupus and DX  With cirrhosis of Liver 6 months r/t Lupus. Pain 9/10. No stomach pain "like this before.  oxycontin 23m PO just before arrival of EMS without relief. Denies vomiting fever or diarrhea.

## 2019-04-07 NOTE — ED Provider Notes (Signed)
Patient signed to me by Dr. Venora Maples pending abdominal CT results.  Patient feels better at this time.  Abdominal CT without acute findings.  Will discharge home   Lacretia Leigh, MD 04/07/19 1751

## 2019-04-07 NOTE — ED Notes (Signed)
ED Provider at bedside.

## 2019-04-07 NOTE — ED Notes (Signed)
Bed: WA01 Expected date:  Expected time:  Means of arrival:  Comments: Abd pain/HTN

## 2019-04-07 NOTE — ED Provider Notes (Signed)
[facility] DEPT Provider Note   CSN: [id number] Arrival date & time: 04/07/19  1401    History   Chief Complaint Chief Complaint  Patient presents with  . Abdominal Pain  . Nausea    CHRONIC    HPI Monica Ellis is a 58 y.o. female.     HPI 68-year-old female presents the emergency department acute onset right-sided abdominal pain which began suddenly today.  She reports nausea without vomiting.  No diarrhea.  She is never had pain or discomfort like this before.  No urinary complaints.  No fevers or chills.  She was in her normal state of health yesterday and earlier this morning.  No radiation of her pain towards her groin.  No history of kidney stones.  She has a history of cirrhosis.  Prior cholecystectomy.  Still has her appendix.   Past Medical History:  Diagnosis Date  . Autoimmune hepatitis ([place])   . Diabetes mellitus without complication ([place])   . GERD (gastroesophageal reflux disease)   . Hypertension   . Lupus ([place])   . Morbid obesity ([place])   . Pneumonia   . Shortness of breath dyspnea   . Tobacco use disorder     Patient Active Problem List   Diagnosis Date Noted  . CAP (community acquired pneumonia) 02/13/2018  . Dizziness 02/12/2018  . Autoimmune hepatitis ([place]) 11/18/2017  . Essential hypertension 11/18/2017  . Class 3 severe obesity due to excess calories with serious comorbidity and body mass index (BMI) of 40.0 to 44.9 in adult ([place]) 11/18/2017  . History of depression 11/18/2017  . Gastroesophageal reflux disease without esophagitis 11/18/2017  . Dyslipidemia 11/18/2017  . Vitamin D deficiency 11/18/2017  . History of migraine 11/18/2017  . History of meningioma 11/18/2017  . DDD (degenerative disc disease), cervical 11/18/2017  . DDD (degenerative disc disease), lumbar 11/18/2017  . Acute diverticulitis 06/07/2017  . Hypokalemia 06/07/2017  . Hypomagnesemia 06/07/2017  . Tobacco use disorder 06/07/2017   . Atelectasis of both lungs 06/07/2017  . Acute maxillary sinusitis 10/13/2016  . Abnormal LFTs   . Pain in the chest   . Diabetes mellitus with complication ([place])   . Hypoxia 12/29/2015  . Uncontrolled type 2 diabetes mellitus ([place]) 12/29/2015  . Dyspnea 12/29/2015  . LLQ pain 12/29/2015  . Lupus ([place]) 12/29/2015  . Chest pain 12/29/2015  . Transaminitis 12/29/2015  . Chronic pain 12/29/2015  . Depression 12/29/2015    Past Surgical History:  Procedure Laterality Date  . CHOLECYSTECTOMY    . DILATION AND CURETTAGE OF UTERUS    . EYE SURGERY Bilateral    laser surgery   . IR TRANSCATHETER BX  11/03/2018  . IR US GUIDE VASC ACCESS RIGHT  11/03/2018  . IR VENOGRAM HEPATIC W HEMODYNAMIC EVALUATION  11/03/2018     OB History   No obstetric history on file.      Home Medications    Prior to Admission medications   Medication Sig Start Date End Date Taking? Authorizing Provider  acetaminophen (TYLENOL) 325 MG tablet Take 2 tablets (650 mg total) by mouth every 4 (four) hours as needed for mild pain, fever or headache. 06/09/17   Robbie Lis, MD  amLODipine (NORVASC) 10 MG tablet Take 10 mg by mouth at bedtime.     [provider]  Cholecalciferol (VITAMIN D PO) Take 1 tablet by mouth every evening.     [provider]  DULoxetine (CYMBALTA) 60 MG capsule Take 60 mg by  mouth at bedtime.  08/26/16   [provider]  fluticasone (FLONASE) 50 MCG/ACT nasal spray Place 1 spray into both nostrils daily. 02/11/17   Monico Blitz, MD  HYDROcodone-homatropine [facility]) 5-1.5 MG/5ML syrup Take 5 mLs by mouth every 6 (six) hours as needed for cough. 02/14/18   Reyne Dumas, MD  insulin lispro protamine-lispro (HUMALOG 75/25 MIX) (75-25) 100 UNIT/ML SUSP injection Inject 90 Units into the skin 3 (three) times daily with meals.    [provider]  LYRICA 100 MG capsule Take 100 mg by mouth at bedtime. 01/23/18   [provider]   magnesium 30 MG tablet Take 30 mg by mouth at bedtime.     [provider]  Multiple Vitamin (MULTIVITAMIN) tablet Take 1 tablet by mouth at bedtime.     [provider]  nadolol (CORGARD) 40 MG tablet Take 40 mg by mouth every evening.  09/16/16   [provider]  Omega-3 Fatty Acids (FISH OIL PO) Take 1 capsule by mouth every evening.     [provider]  omeprazole (PRILOSEC) 40 MG capsule Take 40 mg by mouth every evening.  06/03/17   [provider]  ondansetron (ZOFRAN ODT) 4 MG disintegrating tablet Take 1 tablet (4 mg total) by mouth every 8 (eight) hours as needed for nausea or vomiting. 06/05/17   Isla Pence, MD  Oxycodone HCl 10 MG TABS Take 10 mg by mouth every 6 (six) hours as needed (pain).    [provider]  predniSONE (DELTASONE) 5 MG tablet Take 10 mg by mouth daily.    [provider]  TURMERIC PO Take 400 mg by mouth every evening.     [provider]  ursodiol (ACTIGALL) 300 MG capsule Take 1 capsule (300 mg total) by mouth 3 (three) times daily. 06/09/17   Robbie Lis, MD    Family History Family History  Problem Relation Age of Onset  . Diabetes Father   . Hypertension Father   . Hypertension Mother     Social History Social History   Tobacco Use  . Smoking status: Current Every Day Smoker    Packs/day: 1.00    Years: 30.00    Pack years: 30.00    Types: Cigarettes  . Smokeless tobacco: Never Used  Substance Use Topics  . Alcohol use: No  . Drug use: No     Allergies   Ace inhibitors; Lisinopril; Tramadol; Iodinated diagnostic agents; Iodine; Janumet [sitagliptin-metformin hcl]; and Victoza [liraglutide]   Review of Systems Review of Systems  All other systems reviewed and are negative.    Physical Exam Updated Vital Signs BP (!) 173/75   Pulse 71   Temp 98.4 F (36.9 C) (Oral)   Resp 17   Ht _0  (1.676 m)   Wt 127 kg   SpO2 95%   BMI 45.19 kg/m   Physical  Exam Vitals signs and nursing note reviewed.  Constitutional:      General: She is not in acute distress.    Appearance: She is well-developed.  HENT:     Head: Normocephalic and atraumatic.  Neck:     Musculoskeletal: Normal range of motion.  Cardiovascular:     Rate and Rhythm: Normal rate and regular rhythm.     Heart sounds: Normal heart sounds.  Pulmonary:     Effort: Pulmonary effort is normal.     Breath sounds: Normal breath sounds.  Abdominal:     General: There is no distension.  Palpations: Abdomen is soft.     Comments: Right-sided abdominal tenderness without peritoneal signs  Musculoskeletal: Normal range of motion.  Skin:    General: Skin is warm and dry.  Neurological:     Mental Status: She is alert and oriented to person, place, and time.  Psychiatric:        Judgment: Judgment normal.      ED Treatments / Results  Labs (all labs ordered are listed, but only abnormal results are displayed) Labs Reviewed  COMPREHENSIVE METABOLIC PANEL - Abnormal; Notable for the following components:      Result Value   Potassium 3.2 (*)    Glucose, Bld 142 (*)    Total Protein 9.4 (*)    AST 42 (*)    ALT 62 (*)    All other components within normal limits  URINALYSIS, ROUTINE W REFLEX MICROSCOPIC - Abnormal; Notable for the following components:   Protein, ur 100 (*)    Bacteria, UA RARE (*)    All other components within normal limits  LIPASE, BLOOD  CBC    EKG None  Radiology No results found.  Procedures Procedures (including critical care time)  Medications Ordered in ED Medications  sodium chloride flush (NS) 0.9 % injection 3 mL (3 mLs Intravenous Given 04/07/19 1522)  HYDROmorphone (DILAUDID) injection 1 mg (1 mg Intravenous Given 04/07/19 1614)  ondansetron (ZOFRAN) injection 4 mg (4 mg Intravenous Given 04/07/19 1614)  sodium chloride 0.9 % bolus 1,000 mL (1,000 mLs Intravenous New Bag/Given (Non-Interop) 04/07/19 1614)     Initial Impression  / Assessment and Plan / ED Course  I have reviewed the triage vital signs and the nursing notes.  Pertinent labs & imaging results that were available during my care of the patient were reviewed by me and considered in my medical decision making (see chart for details).        Nonspecific right-sided abdominal tenderness.  Appears uncomfortable.  No flank pain.  Vital signs are stable.  Labs and CT scan pending.  Symptomatic management here in the emergency department.  Care transferred to Dr. Zenia Resides  Final Clinical Impressions(s) / ED Diagnoses   Final diagnoses:  None    ED Discharge Orders    None       Jola Schmidt, MD 04/07/19 1719

## 2019-05-21 ENCOUNTER — Other Ambulatory Visit: Payer: Self-pay | Admitting: Nurse Practitioner

## 2019-05-21 DIAGNOSIS — K754 Autoimmune hepatitis: Secondary | ICD-10-CM

## 2019-06-13 ENCOUNTER — Encounter (HOSPITAL_COMMUNITY): Payer: Self-pay | Admitting: Emergency Medicine

## 2019-06-13 ENCOUNTER — Observation Stay (HOSPITAL_COMMUNITY)
Admission: EM | Admit: 2019-06-13 | Discharge: 2019-06-15 | Disposition: A | Discharge status: Home / Self Care | Payer: Medicare Other | Attending: Internal Medicine | Admitting: Internal Medicine

## 2019-06-13 ENCOUNTER — Other Ambulatory Visit: Payer: Self-pay

## 2019-06-13 DIAGNOSIS — Z79891 Long term (current) use of opiate analgesic: Secondary | ICD-10-CM | POA: Insufficient documentation

## 2019-06-13 DIAGNOSIS — R2689 Other abnormalities of gait and mobility: Secondary | ICD-10-CM | POA: Insufficient documentation

## 2019-06-13 DIAGNOSIS — N179 Acute kidney failure, unspecified: Secondary | ICD-10-CM | POA: Diagnosis present

## 2019-06-13 DIAGNOSIS — R112 Nausea with vomiting, unspecified: Secondary | ICD-10-CM | POA: Diagnosis present

## 2019-06-13 DIAGNOSIS — Z8249 Family history of ischemic heart disease and other diseases of the circulatory system: Secondary | ICD-10-CM | POA: Insufficient documentation

## 2019-06-13 DIAGNOSIS — Z1159 Encounter for screening for other viral diseases: Secondary | ICD-10-CM | POA: Insufficient documentation

## 2019-06-13 DIAGNOSIS — M3214 Glomerular disease in systemic lupus erythematosus: Secondary | ICD-10-CM | POA: Insufficient documentation

## 2019-06-13 DIAGNOSIS — F17210 Nicotine dependence, cigarettes, uncomplicated: Secondary | ICD-10-CM | POA: Insufficient documentation

## 2019-06-13 DIAGNOSIS — F329 Major depressive disorder, single episode, unspecified: Secondary | ICD-10-CM | POA: Diagnosis not present

## 2019-06-13 DIAGNOSIS — E559 Vitamin D deficiency, unspecified: Secondary | ICD-10-CM | POA: Diagnosis not present

## 2019-06-13 DIAGNOSIS — E118 Type 2 diabetes mellitus with unspecified complications: Secondary | ICD-10-CM

## 2019-06-13 DIAGNOSIS — I10 Essential (primary) hypertension: Secondary | ICD-10-CM | POA: Insufficient documentation

## 2019-06-13 DIAGNOSIS — R197 Diarrhea, unspecified: Principal | ICD-10-CM | POA: Insufficient documentation

## 2019-06-13 DIAGNOSIS — K754 Autoimmune hepatitis: Secondary | ICD-10-CM | POA: Diagnosis not present

## 2019-06-13 DIAGNOSIS — Z7952 Long term (current) use of systemic steroids: Secondary | ICD-10-CM | POA: Insufficient documentation

## 2019-06-13 DIAGNOSIS — Z79899 Other long term (current) drug therapy: Secondary | ICD-10-CM | POA: Diagnosis not present

## 2019-06-13 DIAGNOSIS — G8929 Other chronic pain: Secondary | ICD-10-CM | POA: Diagnosis not present

## 2019-06-13 DIAGNOSIS — R2681 Unsteadiness on feet: Secondary | ICD-10-CM | POA: Diagnosis not present

## 2019-06-13 DIAGNOSIS — Z9049 Acquired absence of other specified parts of digestive tract: Secondary | ICD-10-CM | POA: Diagnosis not present

## 2019-06-13 DIAGNOSIS — Z6841 Body Mass Index (BMI) 40.0 and over, adult: Secondary | ICD-10-CM | POA: Insufficient documentation

## 2019-06-13 DIAGNOSIS — E119 Type 2 diabetes mellitus without complications: Secondary | ICD-10-CM | POA: Insufficient documentation

## 2019-06-13 DIAGNOSIS — Z888 Allergy status to other drugs, medicaments and biological substances status: Secondary | ICD-10-CM | POA: Insufficient documentation

## 2019-06-13 DIAGNOSIS — E876 Hypokalemia: Secondary | ICD-10-CM | POA: Diagnosis not present

## 2019-06-13 DIAGNOSIS — E785 Hyperlipidemia, unspecified: Secondary | ICD-10-CM | POA: Diagnosis not present

## 2019-06-13 DIAGNOSIS — Z794 Long term (current) use of insulin: Secondary | ICD-10-CM | POA: Insufficient documentation

## 2019-06-13 DIAGNOSIS — K219 Gastro-esophageal reflux disease without esophagitis: Secondary | ICD-10-CM | POA: Diagnosis not present

## 2019-06-13 MED ORDER — SODIUM CHLORIDE 0.9 % IV BOLUS
500.0000 mL | Freq: Once | INTRAVENOUS | Status: AC
  Administered 2019-06-14: 500 mL via INTRAVENOUS

## 2019-06-13 MED ORDER — ONDANSETRON HCL 4 MG/2ML IJ SOLN
4.0000 mg | Freq: Once | INTRAMUSCULAR | Status: AC
  Administered 2019-06-14: 4 mg via INTRAVENOUS
  Filled 2019-06-13: qty 2

## 2019-06-13 NOTE — ED Triage Notes (Signed)
Pt to ED via [facility] with c/o nausea, vomiting and diarrhea.  Onset today after taking Azathioprine for the first time.  Pt st's she vomited multiple times and has had diarrhea multiple times.

## 2019-06-13 NOTE — ED Provider Notes (Signed)
[facility] EMERGENCY DEPARTMENT Provider Note   CSN: [id number] Arrival date & time: 06/13/19  2117    History   Chief Complaint Chief Complaint  Patient presents with  . Emesis  . Diarrhea    HPI Monica Ellis is a 58 y.o. female.     HPI   Monica Ellis is a 58 y.o. female, with a history of lupus, GERD, DM, LMN hepatitis, morbid obesity, presenting to the ED with nausea, vomiting, and diarrhea beginning around 5 PM today. Patient was placed on a new medication, Azathioprine, for her autoimmune hepatitis starting today.  She took her first dose around 4 PM. She had multiple episodes of nonbloody nonbilious vomiting.  She has not had any vomiting or diarrhea since her arrival in the ED.  Denies fever/chills, chest pain, shortness of breath, abdominal pain, hematochezia/melena, urinary symptoms, syncope, recent illness, or any other complaints.    Past Medical History:  Diagnosis Date  . Autoimmune hepatitis ([place])   . Diabetes mellitus without complication ([place])   . GERD (gastroesophageal reflux disease)   . Hypertension   . Lupus ([place])   . Morbid obesity ([place])   . Pneumonia   . Shortness of breath dyspnea   . Tobacco use disorder     Patient Active Problem List   Diagnosis Date Noted  . Nausea vomiting and diarrhea 06/14/2019  . AKI (acute kidney injury) ([place]) 06/14/2019  . CAP (community acquired pneumonia) 02/13/2018  . Dizziness 02/12/2018  . Autoimmune hepatitis ([place]) 11/18/2017  . Essential hypertension 11/18/2017  . Class 3 severe obesity due to excess calories with serious comorbidity and body mass index (BMI) of 40.0 to 44.9 in adult ([place]) 11/18/2017  . History of depression 11/18/2017  . Gastroesophageal reflux disease without esophagitis 11/18/2017  . Dyslipidemia 11/18/2017  . Vitamin D deficiency 11/18/2017  . History of migraine 11/18/2017  . History of meningioma 11/18/2017  . DDD (degenerative disc disease),  cervical 11/18/2017  . DDD (degenerative disc disease), lumbar 11/18/2017  . Acute diverticulitis 06/07/2017  . Hypokalemia 06/07/2017  . Hypomagnesemia 06/07/2017  . Tobacco use disorder 06/07/2017  . Atelectasis of both lungs 06/07/2017  . Acute maxillary sinusitis 10/13/2016  . Abnormal LFTs   . Pain in the chest   . Diabetes mellitus with complication ([place])   . Hypoxia 12/29/2015  . Uncontrolled type 2 diabetes mellitus ([place]) 12/29/2015  . Dyspnea 12/29/2015  . LLQ pain 12/29/2015  . Lupus ([place]) 12/29/2015  . Chest pain 12/29/2015  . Transaminitis 12/29/2015  . Chronic pain 12/29/2015  . Depression 12/29/2015    Past Surgical History:  Procedure Laterality Date  . CHOLECYSTECTOMY    . DILATION AND CURETTAGE OF UTERUS    . EYE SURGERY Bilateral    laser surgery   . IR TRANSCATHETER BX  11/03/2018  . IR US GUIDE VASC ACCESS RIGHT  11/03/2018  . IR VENOGRAM HEPATIC W HEMODYNAMIC EVALUATION  11/03/2018     OB History   No obstetric history on file.      Home Medications    Prior to Admission medications   Medication Sig Start Date End Date Taking? Authorizing Provider  acetaminophen (TYLENOL) 325 MG tablet Take 2 tablets (650 mg total) by mouth every 4 (four) hours as needed for mild pain, fever or headache. 06/09/17  Yes Robbie Lis, MD  amLODipine (NORVASC) 5 MG tablet Take 5 mg by mouth every evening. 06/08/19  Yes [provider]  Cholecalciferol (VITAMIN D PO) Take  1 tablet by mouth every evening.    Yes [provider]  DULoxetine (CYMBALTA) 60 MG capsule Take 60 mg by mouth at bedtime.  08/26/16  Yes [provider]  fluticasone (FLONASE) 50 MCG/ACT nasal spray Place 1 spray into both nostrils daily. 02/11/17  Yes Caudill, Gwynneth Aliment, MD  insulin lispro protamine-lispro (HUMALOG 75/25 MIX) (75-25) 100 UNIT/ML SUSP injection Inject 90 Units into the skin 3 (three) times daily with meals.   Yes [provider]  LYRICA 100  MG capsule Take 100 mg by mouth at bedtime. 01/23/18  Yes [provider]  magnesium 30 MG tablet Take 30 mg by mouth at bedtime.    Yes [provider]  Multiple Vitamin (MULTIVITAMIN) tablet Take 1 tablet by mouth at bedtime.    Yes [provider]  nadolol (CORGARD) 40 MG tablet Take 40 mg by mouth every evening.  09/16/16  Yes [provider]  Omega-3 Fatty Acids (FISH OIL PO) Take 1 capsule by mouth every evening.    Yes [provider]  omeprazole (PRILOSEC) 40 MG capsule Take 40 mg by mouth every evening.  06/03/17  Yes [provider]  Oxycodone HCl 10 MG TABS Take 10 mg by mouth every 6 (six) hours as needed (pain).   Yes [provider]  predniSONE (DELTASONE) 5 MG tablet Take 10 mg by mouth daily.   Yes [provider]  TURMERIC PO Take 400 mg by mouth every evening.    Yes [provider]  zolpidem (AMBIEN) 10 MG tablet Take 10 mg by mouth at bedtime. 05/15/19  Yes [provider]    Family History Family History  Problem Relation Age of Onset  . Diabetes Father   . Hypertension Father   . Hypertension Mother     Social History Social History   Tobacco Use  . Smoking status: Current Every Day Smoker    Packs/day: 1.00    Years: 30.00    Pack years: 30.00    Types: Cigarettes  . Smokeless tobacco: Never Used  Substance Use Topics  . Alcohol use: No  . Drug use: No     Allergies   Ace inhibitors, Imuran [azathioprine], Lisinopril, Tramadol, Iodinated diagnostic agents, Iodine, Janumet [sitagliptin-metformin hcl], and Victoza [liraglutide]   Review of Systems Review of Systems  Constitutional: Negative for chills and fever.  Respiratory: Negative for shortness of breath.   Cardiovascular: Negative for chest pain.  Gastrointestinal: Positive for diarrhea, nausea and vomiting. Negative for abdominal pain and blood in stool.  Neurological: Negative for dizziness, syncope,  weakness, light-headedness and numbness.  All other systems reviewed and are negative.    Physical Exam Updated Vital Signs BP 112/72   Pulse (!) 103   Temp 98 F (36.7 C)   Resp (!) 24   Ht _0  (1.676 m)   Wt (!) 138.3 kg   SpO2 99%   BMI 49.23 kg/m   Physical Exam Vitals signs and nursing note reviewed.  Constitutional:      General: She is not in acute distress.    Appearance: She is well-developed. She is not diaphoretic.  HENT:     Head: Normocephalic and atraumatic.     Mouth/Throat:     Mouth: Mucous membranes are moist.     Pharynx: Oropharynx is clear.  Eyes:     Conjunctiva/sclera: Conjunctivae normal.  Neck:     Musculoskeletal: Neck supple.  Cardiovascular:     Rate and Rhythm: Normal rate  and regular rhythm.     Pulses: Normal pulses.          Radial pulses are 2+ on the right side and 2+ on the left side.       Posterior tibial pulses are 2+ on the right side and 2+ on the left side.     Heart sounds: Normal heart sounds.     Comments: Tactile temperature in the extremities appropriate and equal bilaterally. Pulmonary:     Effort: Pulmonary effort is normal. No respiratory distress.     Breath sounds: Normal breath sounds.  Abdominal:     Palpations: Abdomen is soft.     Tenderness: There is no abdominal tenderness. There is no guarding.  Musculoskeletal:     Right lower leg: No edema.     Left lower leg: No edema.  Lymphadenopathy:     Cervical: No cervical adenopathy.  Skin:    General: Skin is warm and dry.  Neurological:     Mental Status: She is alert.  Psychiatric:        Mood and Affect: Mood and affect normal.        Speech: Speech normal.        Behavior: Behavior normal.      ED Treatments / Results  Labs (all labs ordered are listed, but only abnormal results are displayed) Labs Reviewed  COMPREHENSIVE METABOLIC PANEL - Abnormal; Notable for the following components:      Result Value   CO2 20 (*)    Glucose, Bld 107 (*)     BUN 26 (*)    Creatinine, Ser 1.77 (*)    Calcium 8.6 (*)    Total Protein 8.4 (*)    Albumin 3.4 (*)    AST 83 (*)    ALT 64 (*)    Alkaline Phosphatase 34 (*)    Total Bilirubin 1.8 (*)    GFR calc non Af Amer 31 (*)    GFR calc Af Amer 36 (*)    All other components within normal limits  CBC WITH DIFFERENTIAL/PLATELET - Abnormal; Notable for the following components:   WBC 13.1 (*)    RBC 5.26 (*)    Hemoglobin 17.2 (*)    HCT 52.5 (*)    nRBC 0.3 (*)    Neutro Abs 11.5 (*)    Lymphs Abs 0.4 (*)    Monocytes Absolute 0.0 (*)    nRBC 1 (*)    Abs Immature Granulocytes 1.00 (*)    All other components within normal limits  SARS CORONAVIRUS 2 (HOSPITAL ORDER, [facility])  HEMOGLOBIN N3Y  BASIC METABOLIC PANEL  HIV ANTIBODY (ROUTINE TESTING W REFLEX)  URINALYSIS, COMPLETE (UACMP) WITH MICROSCOPIC   BUN  Date Value Ref Range Status  06/13/2019 26 (H) 6 - 20 mg/dL Final  04/07/2019 17 6 - 20 mg/dL Final  10/30/2018 15 6 - 20 mg/dL Final  02/13/2018 17 6 - 20 mg/dL Final   Creat  Date Value Ref Range Status  11/23/2017 0.79 0.50 - 1.05 mg/dL Final    Comment:    For patients >53 years of age, the reference limit for Creatinine is approximately 13% higher for people identified as African-American. .    Creatinine, Ser  Date Value Ref Range Status  06/13/2019 1.77 (H) 0.44 - 1.00 mg/dL Final  04/07/2019 0.84 0.44 - 1.00 mg/dL Final  10/30/2018 0.72 0.44 - 1.00 mg/dL Final  02/13/2018 0.74 0.44 - 1.00 mg/dL Final  ALT  Date Value Ref Range Status  06/13/2019 64 (H) 0 - 44 U/L Final  04/07/2019 62 (H) 0 - 44 U/L Final  10/30/2018 243 (H) 0 - 44 U/L Final  02/13/2018 112 (H) 14 - 54 U/L Final    AST  Date Value Ref Range Status  06/13/2019 83 (H) 15 - 41 U/L Final  04/07/2019 42 (H) 15 - 41 U/L Final  10/30/2018 136 (H) 15 - 41 U/L Final  02/13/2018 72 (H) 15 - 41 U/L Final     EKG None  Radiology No results  found.  Procedures Procedures (including critical care time)  Medications Ordered in ED Medications  sodium chloride 0.9 % bolus 1,000 mL (has no administration in time range)  nadolol (CORGARD) tablet 40 mg (has no administration in time range)  oxyCODONE (Oxy IR/ROXICODONE) immediate release tablet 10 mg (has no administration in time range)  predniSONE (DELTASONE) tablet 10 mg (has no administration in time range)  pantoprazole (PROTONIX) EC tablet 80 mg (has no administration in time range)  zolpidem (AMBIEN) tablet 5 mg (has no administration in time range)  pregabalin (LYRICA) capsule 100 mg (has no administration in time range)  magnesium oxide (MAG-OX) tablet 200 mg (has no administration in time range)  multivitamin with minerals tablet 1 tablet (has no administration in time range)  amLODipine (NORVASC) tablet 5 mg (has no administration in time range)  DULoxetine (CYMBALTA) DR capsule 60 mg (has no administration in time range)  fluticasone (FLONASE) 50 MCG/ACT nasal spray 1 spray (has no administration in time range)  acetaminophen (TYLENOL) tablet 650 mg (has no administration in time range)  insulin aspart (novoLOG) injection 0-15 Units (has no administration in time range)  0.9 %  sodium chloride infusion (has no administration in time range)  ondansetron (ZOFRAN) tablet 4 mg (has no administration in time range)    Or  ondansetron (ZOFRAN) injection 4 mg (has no administration in time range)  enoxaparin (LOVENOX) injection 40 mg (has no administration in time range)  sodium chloride 0.9 % bolus 500 mL (0 mLs Intravenous Stopped 06/14/19 0151)  ondansetron (ZOFRAN) injection 4 mg (4 mg Intravenous Given 06/14/19 0003)     Initial Impression / Assessment and Plan / ED Course  I have reviewed the triage vital signs and the nursing notes.  Pertinent labs & imaging results that were available during my care of the patient were reviewed by me and considered in my medical  decision making (see chart for details).  Clinical Course as of Jun 13 237  Thu Jun 14, 2019  0106 Patient states she feels much better.  Nausea has resolved.  She still has not had any instances of vomiting.  Tolerating p.o. fluids.   [SJ]  0212 Spoke with Dr. Alcario Drought, hospitalist. Agrees to admit the patient.    [SJ]    Clinical Course User Index [SJ] ,  C, PA-C        Patient presents with nausea, vomiting, and diarrhea.  Suspect this may be a side effect of her newly started Azathioprine.  Patient is nontoxic appearing, afebrile, not tachypneic, not hypotensive on my exam, maintains excellent SPO2 on room air, and is in no apparent distress.  Initially tachycardic but this improved with IV fluids. Patient initially hypotensive, however, this resolved prior to any intervention.  Maintained normal mental status throughout ED course. Patient voiced improvement over ED course, however, she was noted to have AKI with creatinine of 1.77 and BUN of 26.  Suspect this is due to dehydration given her concurrently elevated total bilirubin and decreased CO2.  We will admit for continued IV hydration.  Findings and plan of care discussed with [facility], DO.   Vitals:   06/13/19 2300 06/13/19 2315 06/14/19 0115 06/14/19 0306  BP: 124/67 112/72 126/81 129/75  Pulse: (!) 103 (!) 103 99 98  Resp:    (!) 21  Temp:    99.2 F (37.3 C)  TempSrc:    Oral  SpO2: 98% 99% 94% 93%  Weight:      Height:         Final Clinical Impressions(s) / ED Diagnoses   Final diagnoses:  Nausea vomiting and diarrhea  AKI (acute kidney injury) [facility])    ED Discharge Orders    None       Layla Maw 06/14/19 [id number]    Ward, Delice Bison, DO 06/14/19 0327

## 2019-06-14 ENCOUNTER — Other Ambulatory Visit: Payer: Self-pay

## 2019-06-14 DIAGNOSIS — Z6841 Body Mass Index (BMI) 40.0 and over, adult: Secondary | ICD-10-CM

## 2019-06-14 DIAGNOSIS — R197 Diarrhea, unspecified: Principal | ICD-10-CM

## 2019-06-14 DIAGNOSIS — G894 Chronic pain syndrome: Secondary | ICD-10-CM | POA: Diagnosis not present

## 2019-06-14 DIAGNOSIS — N179 Acute kidney failure, unspecified: Secondary | ICD-10-CM | POA: Diagnosis present

## 2019-06-14 DIAGNOSIS — I10 Essential (primary) hypertension: Secondary | ICD-10-CM

## 2019-06-14 DIAGNOSIS — E118 Type 2 diabetes mellitus with unspecified complications: Secondary | ICD-10-CM

## 2019-06-14 DIAGNOSIS — R112 Nausea with vomiting, unspecified: Secondary | ICD-10-CM | POA: Diagnosis not present

## 2019-06-14 DIAGNOSIS — K754 Autoimmune hepatitis: Secondary | ICD-10-CM

## 2019-06-14 LAB — URINALYSIS, COMPLETE (UACMP) WITH MICROSCOPIC
Bilirubin Urine: NEGATIVE
Glucose, UA: NEGATIVE mg/dL
Hgb urine dipstick: NEGATIVE
Ketones, ur: NEGATIVE mg/dL
Nitrite: NEGATIVE
Protein, ur: 100 mg/dL — AB
Specific Gravity, Urine: 1.018 (ref 1.005–1.030)
pH: 5 (ref 5.0–8.0)

## 2019-06-14 LAB — HIV ANTIBODY (ROUTINE TESTING W REFLEX): HIV Screen 4th Generation wRfx: NONREACTIVE

## 2019-06-14 LAB — CBC WITH DIFFERENTIAL/PLATELET
Abs Immature Granulocytes: 1 10*3/uL — ABNORMAL HIGH (ref 0.00–0.07)
Band Neutrophils: 6 %
Basophils Absolute: 0.1 10*3/uL (ref 0.0–0.1)
Basophils Relative: 1 %
Eosinophils Absolute: 0 10*3/uL (ref 0.0–0.5)
Eosinophils Relative: 0 %
HCT: 52.5 % — ABNORMAL HIGH (ref 36.0–46.0)
Hemoglobin: 17.2 g/dL — ABNORMAL HIGH (ref 12.0–15.0)
Lymphocytes Relative: 3 %
Lymphs Abs: 0.4 10*3/uL — ABNORMAL LOW (ref 0.7–4.0)
MCH: 32.7 pg (ref 26.0–34.0)
MCHC: 32.8 g/dL (ref 30.0–36.0)
MCV: 99.8 fL (ref 80.0–100.0)
Metamyelocytes Relative: 5 %
Monocytes Absolute: 0 10*3/uL — ABNORMAL LOW (ref 0.1–1.0)
Monocytes Relative: 0 %
Myelocytes: 3 %
Neutro Abs: 11.5 10*3/uL — ABNORMAL HIGH (ref 1.7–7.7)
Neutrophils Relative %: 82 %
Platelets: 216 10*3/uL (ref 150–400)
RBC: 5.26 MIL/uL — ABNORMAL HIGH (ref 3.87–5.11)
RDW: 14.4 % (ref 11.5–15.5)
WBC: 13.1 10*3/uL — ABNORMAL HIGH (ref 4.0–10.5)
nRBC: 0.3 % — ABNORMAL HIGH (ref 0.0–0.2)
nRBC: 1 /100 WBC — ABNORMAL HIGH

## 2019-06-14 LAB — BASIC METABOLIC PANEL
Anion gap: 12 (ref 5–15)
BUN: 31 mg/dL — ABNORMAL HIGH (ref 6–20)
CO2: 21 mmol/L — ABNORMAL LOW (ref 22–32)
Calcium: 8.8 mg/dL — ABNORMAL LOW (ref 8.9–10.3)
Chloride: 104 mmol/L (ref 98–111)
Creatinine, Ser: 2.01 mg/dL — ABNORMAL HIGH (ref 0.44–1.00)
GFR calc Af Amer: 30 mL/min — ABNORMAL LOW (ref >60)
GFR calc non Af Amer: 26 mL/min — ABNORMAL LOW (ref >60)
Glucose, Bld: 143 mg/dL — ABNORMAL HIGH (ref 70–99)
Potassium: 3.4 mmol/L — ABNORMAL LOW (ref 3.5–5.1)
Sodium: 137 mmol/L (ref 135–145)

## 2019-06-14 LAB — COMPREHENSIVE METABOLIC PANEL
ALT: 64 U/L — ABNORMAL HIGH (ref 0–44)
AST: 83 U/L — ABNORMAL HIGH (ref 15–41)
Albumin: 3.4 g/dL — ABNORMAL LOW (ref 3.5–5.0)
Alkaline Phosphatase: 34 U/L — ABNORMAL LOW (ref 38–126)
Anion gap: 13 (ref 5–15)
BUN: 26 mg/dL — ABNORMAL HIGH (ref 6–20)
CO2: 20 mmol/L — ABNORMAL LOW (ref 22–32)
Calcium: 8.6 mg/dL — ABNORMAL LOW (ref 8.9–10.3)
Chloride: 107 mmol/L (ref 98–111)
Creatinine, Ser: 1.77 mg/dL — ABNORMAL HIGH (ref 0.44–1.00)
GFR calc Af Amer: 36 mL/min — ABNORMAL LOW (ref >60)
GFR calc non Af Amer: 31 mL/min — ABNORMAL LOW (ref >60)
Glucose, Bld: 107 mg/dL — ABNORMAL HIGH (ref 70–99)
Potassium: 4.1 mmol/L (ref 3.5–5.1)
Sodium: 140 mmol/L (ref 135–145)
Total Bilirubin: 1.8 mg/dL — ABNORMAL HIGH (ref 0.3–1.2)
Total Protein: 8.4 g/dL — ABNORMAL HIGH (ref 6.5–8.1)

## 2019-06-14 LAB — GLUCOSE, CAPILLARY
Glucose-Capillary: 161 mg/dL — ABNORMAL HIGH (ref 70–99)
Glucose-Capillary: 152 mg/dL — ABNORMAL HIGH (ref 70–99)
Glucose-Capillary: 174 mg/dL — ABNORMAL HIGH (ref 70–99)
Glucose-Capillary: 129 mg/dL — ABNORMAL HIGH (ref 70–99)
Glucose-Capillary: 128 mg/dL — ABNORMAL HIGH (ref 70–99)

## 2019-06-14 LAB — SARS CORONAVIRUS 2 BY RT PCR (HOSPITAL ORDER, PERFORMED IN ~~LOC~~ HOSPITAL LAB): SARS Coronavirus 2: NEGATIVE

## 2019-06-14 MED ORDER — OXYCODONE HCL 5 MG PO TABS
10.0000 mg | ORAL_TABLET | Freq: Four times a day (QID) | ORAL | Status: DC | PRN
  Administered 2019-06-14 – 2019-06-15 (×5): 10 mg via ORAL
  Filled 2019-06-14 (×5): qty 2

## 2019-06-14 MED ORDER — AMLODIPINE BESYLATE 5 MG PO TABS
5.0000 mg | ORAL_TABLET | Freq: Every evening | ORAL | Status: DC
  Administered 2019-06-14: 16:00:00 5 mg via ORAL
  Filled 2019-06-14: qty 1

## 2019-06-14 MED ORDER — ENOXAPARIN SODIUM 40 MG/0.4ML ~~LOC~~ SOLN
40.0000 mg | SUBCUTANEOUS | Status: DC
  Administered 2019-06-14: 16:00:00 40 mg via SUBCUTANEOUS
  Filled 2019-06-14: qty 0.4

## 2019-06-14 MED ORDER — ONDANSETRON HCL 4 MG/2ML IJ SOLN: 4.0000 mg | Freq: Four times a day (QID) | INTRAMUSCULAR | Status: DC | PRN

## 2019-06-14 MED ORDER — ONDANSETRON HCL 4 MG PO TABS: 4.0000 mg | ORAL_TABLET | Freq: Four times a day (QID) | ORAL | Status: DC | PRN

## 2019-06-14 MED ORDER — SODIUM CHLORIDE 0.9 % IV SOLN
INTRAVENOUS | Status: DC
  Administered 2019-06-14 – 2019-06-15 (×4): via INTRAVENOUS

## 2019-06-14 MED ORDER — INSULIN ASPART 100 UNIT/ML ~~LOC~~ SOLN
0.0000 [IU] | SUBCUTANEOUS | Status: DC
  Administered 2019-06-14 (×3): 3 [IU] via SUBCUTANEOUS
  Administered 2019-06-14 – 2019-06-15 (×3): 2 [IU] via SUBCUTANEOUS
  Administered 2019-06-15: 04:00:00 3 [IU] via SUBCUTANEOUS

## 2019-06-14 MED ORDER — MAGNESIUM OXIDE 400 (241.3 MG) MG PO TABS
200.0000 mg | ORAL_TABLET | Freq: Every day | ORAL | Status: DC
  Administered 2019-06-14: 22:00:00 200 mg via ORAL
  Filled 2019-06-14: qty 1

## 2019-06-14 MED ORDER — PREDNISONE 10 MG PO TABS
10.0000 mg | ORAL_TABLET | Freq: Every day | ORAL | Status: DC
  Administered 2019-06-14 – 2019-06-15 (×2): 10 mg via ORAL
  Filled 2019-06-14 (×2): qty 1

## 2019-06-14 MED ORDER — NADOLOL 40 MG PO TABS
40.0000 mg | ORAL_TABLET | Freq: Every evening | ORAL | Status: DC
  Administered 2019-06-14: 40 mg via ORAL
  Filled 2019-06-14 (×2): qty 1

## 2019-06-14 MED ORDER — FLUTICASONE PROPIONATE 50 MCG/ACT NA SUSP
1.0000 | Freq: Every day | NASAL | Status: DC
  Filled 2019-06-14: qty 16

## 2019-06-14 MED ORDER — ZOLPIDEM TARTRATE 5 MG PO TABS
5.0000 mg | ORAL_TABLET | Freq: Every day | ORAL | Status: DC
  Administered 2019-06-14: 22:00:00 5 mg via ORAL
  Filled 2019-06-14: qty 1

## 2019-06-14 MED ORDER — ADULT MULTIVITAMIN W/MINERALS CH
1.0000 | ORAL_TABLET | Freq: Every day | ORAL | Status: DC
  Administered 2019-06-14: 1 via ORAL
  Filled 2019-06-14 (×2): qty 1

## 2019-06-14 MED ORDER — PREGABALIN 100 MG PO CAPS
100.0000 mg | ORAL_CAPSULE | Freq: Every day | ORAL | Status: DC
  Administered 2019-06-14: 22:00:00 100 mg via ORAL
  Filled 2019-06-14: qty 1

## 2019-06-14 MED ORDER — POTASSIUM CHLORIDE 20 MEQ PO PACK
20.0000 meq | PACK | Freq: Once | ORAL | Status: AC
  Administered 2019-06-14: 20 meq via ORAL
  Filled 2019-06-14: qty 1

## 2019-06-14 MED ORDER — SODIUM CHLORIDE 0.9 % IV BOLUS
1000.0000 mL | Freq: Once | INTRAVENOUS | Status: AC
  Administered 2019-06-14: 03:00:00 1000 mL via INTRAVENOUS

## 2019-06-14 MED ORDER — PANTOPRAZOLE SODIUM 40 MG PO TBEC
80.0000 mg | DELAYED_RELEASE_TABLET | Freq: Every day | ORAL | Status: DC
  Administered 2019-06-14 – 2019-06-15 (×2): 80 mg via ORAL
  Filled 2019-06-14 (×2): qty 2

## 2019-06-14 MED ORDER — DULOXETINE HCL 60 MG PO CPEP
60.0000 mg | ORAL_CAPSULE | Freq: Every day | ORAL | Status: DC
  Administered 2019-06-14: 22:00:00 60 mg via ORAL
  Filled 2019-06-14: qty 1

## 2019-06-14 MED ORDER — ACETAMINOPHEN 325 MG PO TABS
650.0000 mg | ORAL_TABLET | ORAL | Status: DC | PRN
  Administered 2019-06-14 – 2019-06-15 (×2): 650 mg via ORAL
  Filled 2019-06-14 (×3): qty 2

## 2019-06-14 NOTE — Plan of Care (Signed)
  Problem: Clinical Measurements: Goal: Ability to maintain clinical measurements within normal limits will improve Outcome: Progressing   Problem: Elimination: Goal: Will not experience complications related to urinary retention Outcome: Progressing   Problem: Pain Managment: Goal: General experience of comfort will improve Outcome: Progressing   Problem: Safety: Goal: Ability to remain free from injury will improve Outcome: Progressing

## 2019-06-14 NOTE — Evaluation (Signed)
Physical Therapy Evaluation Patient Details Name: Monica Ellis MRN: [id number] DOB: 17-Sep-1961 Today's Date: 06/14/2019   History of Present Illness  Pt is a 58 y.o. F with significant PMH of SLE, DM2, HTN, and autoimmune hepatitis. Pt recently taken off Cellcept (causing progressive generalized weakness) and switched to Imuran for auto immune hepatitis. Since first dose of medication, pt presents with nausea, vomiting and diarrhea and subsequent acute kidney injury.  Clinical Impression  Pt admitted with above. Prior to admission, pt reports 4 months of increasing debility/weakness due to side effect of Cellcept. She is a limited household ambulator and often "rolls," from room to room seated on a Rollator. She presents with decreased endurance and weakness (lower extremities weaker than upper extremities). Ambulating 12 feet with walker and min guard assist. Pt lives with her mother and has good family/friend support from her daughter and neighbors. Recommending HHPT to maximize functional independence upon discharge home.     Follow Up Recommendations Home health PT;Supervision for mobility/OOB    Equipment Recommendations  Rolling walker with 5" wheels (declining w/c)   Recommendations for Other Services OT consult     Precautions / Restrictions Precautions Precautions: Fall Restrictions Weight Bearing Restrictions: No      Mobility  Bed Mobility Overal bed mobility: Needs Assistance Bed Mobility: Supine to Sit;Sit to Supine     Supine to sit: Min guard Sit to supine: Min guard   General bed mobility comments: No physical assist required, use of bed rail with HOB elevated. Increased effort  Transfers Overall transfer level: Needs assistance Equipment used: Rolling walker (2 wheeled) Transfers: Sit to/from Stand Sit to Stand: Min guard         General transfer comment: Cues for hand placement  Ambulation/Gait Ambulation/Gait assistance: Min guard Gait  Distance (Feet): 12 Feet Assistive device: Rolling walker (2 wheeled) Gait Pattern/deviations: Step-through pattern;Decreased stride length;Decreased dorsiflexion - right;Decreased dorsiflexion - left;Shuffle Gait velocity: decreased Gait velocity interpretation: <1.31 ft/sec, indicative of household ambulator General Gait Details: Decreased bilateral foot clearance (R>L), fatigues very quickly  Stairs            Wheelchair Mobility    Modified Rankin (Stroke Patients Only)       Balance Overall balance assessment: Needs assistance Sitting-balance support: Feet supported Sitting balance-Leahy Scale: Good     Standing balance support: Bilateral upper extremity supported Standing balance-Leahy Scale: Poor Standing balance comment: reliant on walker in standing                             Pertinent Vitals/Pain Pain Assessment: No/denies pain    Home Living Family/patient expects to be discharged to:: Private residence Living Arrangements: Parent(mother) Available Help at Discharge: Family;Available PRN/intermittently;Friend(s) Type of Home: House Home Access: Stairs to enter Entrance Stairs-Rails: Left Entrance Stairs-Number of Steps: 5 Home Layout: One level Home Equipment: Walker - 4 wheels;Cane - single point;Shower seat      Prior Function Level of Independence: Needs assistance   Gait / Transfers Assistance Needed: progressive weakness since starting Cellcept 4 months ago. Has been a limited household ambulator with cane and using Rollator to "roll from room to room."   ADL's / Homemaking Assistance Needed: Independent with ADL's but reports increased difficulty.   Comments: Used to enjoy doing martial arts     Journalist, newspaper        Extremity/Trunk Assessment   Upper Extremity Assessment Upper Extremity Assessment: RUE deficits/detail;LUE deficits/detail RUE Deficits /  Details: MMT: shoulder flexion 5/5, elbow flexion/extension 3+/5 LUE  Deficits / Details: MMT: shoulder flexion 5/5, elbow extension/flexion 3+/5    Lower Extremity Assessment Lower Extremity Assessment: RLE deficits/detail;LLE deficits/detail RLE Deficits / Details: MMT: hip flexion 3+/5, knee extension 5/5, ankle dorsiflexion 2/5 LLE Deficits / Details: MMT: hip flexion 3+/5, knee extension 5/5, ankle dorsiflexion 5/5    Cervical / Trunk Assessment Cervical / Trunk Assessment: Other exceptions Cervical / Trunk Exceptions: increased body habitus  Communication   Communication: No difficulties  Cognition Arousal/Alertness: Awake/alert Behavior During Therapy: WFL for tasks assessed/performed Overall Cognitive Status: Within Functional Limits for tasks assessed                                        General Comments      Exercises     Assessment/Plan    PT Assessment Patient needs continued PT services  PT Problem List Decreased strength;Decreased activity tolerance;Decreased balance;Decreased mobility       PT Treatment Interventions DME instruction;Gait training;Stair training;Functional mobility training;Therapeutic activities;Therapeutic exercise;Balance training;Patient/family education    PT Goals (Current goals can be found in the Care Plan section)  Acute Rehab PT Goals Patient Stated Goal: "be independent for as long as possible." PT Goal Formulation: With patient Time For Goal Achievement: 06/28/19 Potential to Achieve Goals: Good    Frequency Min 3X/week   Barriers to discharge        Co-evaluation               AM-PAC PT "6 Clicks" Mobility  Outcome Measure Help needed turning from your back to your side while in a flat bed without using bedrails?: None Help needed moving from lying on your back to sitting on the side of a flat bed without using bedrails?: A Little Help needed moving to and from a bed to a chair (including a wheelchair)?: A Little Help needed standing up from a chair using your  arms (e.g., wheelchair or bedside chair)?: A Little Help needed to walk in hospital room?: A Little Help needed climbing 3-5 steps with a railing? : A Lot 6 Click Score: 18    End of Session Equipment Utilized During Treatment: Gait belt Activity Tolerance: Patient tolerated treatment well Patient left: in bed;with call bell/phone within reach   PT Visit Diagnosis: Unsteadiness on feet (R26.81);Muscle weakness (generalized) (M62.81);Difficulty in walking, not elsewhere classified (R26.2)    Time: 1595-3967 PT Time Calculation (min) (ACUTE ONLY): 33 min   Charges:   PT Evaluation $PT Eval Moderate Complexity: 1 Mod PT Treatments $Therapeutic Activity: 8-22 mins        Ellamae Sia, PT, DPT Acute Rehabilitation Services Pager [phone] Office [phone]   Willy Eddy 06/14/2019, 4:58 PM

## 2019-06-14 NOTE — Progress Notes (Signed)
I have seen and assessed patient and agree with Dr. Juleen China assessment and plan.  Patient is a 58 year old female history of SLE, diabetes, hypertension, autoimmune hepatitis recently taken off CellCept and switched to Imuran on the day of admission for autoimmune hepatitis.  Patient took first dose of Imuran and subsequently developed intractable nausea vomiting and diarrhea and presented to the ED.  Patient also noted to be in acute renal failure going up to 2.01 from a baseline of about 0.8.  Placed on IV fluids which we will continue for now.  Monitor renal function.  If no improvement with renal function may need evaluation by nephrology.  Advance diet to a full liquid diet.  Continue supportive care.  Follow.  No charge.

## 2019-06-14 NOTE — Progress Notes (Signed)
0300 Received pt from ED, AOX4, in mild pain of the back, given prn oxy 10 mg. Vital signs stable. Oriented to room and plan of care. Call bell at reach, left lying comfortably in bed. Will continue to monitor.

## 2019-06-14 NOTE — ED Notes (Signed)
ED TO INPATIENT HANDOFF REPORT  ED Nurse Name and Phone #: Nicole Cella Name/Age/Gender Monica Ellis 58 y.o. female Room/Bed: RESUSC/RESUSC  Code Status   Code Status: Full Code  Home/SNF/Other Home Patient oriented to: self, place, time and situation Is this baseline? Yes   Triage Complete: Triage complete  Chief Complaint Hypotention  Triage Note Pt to ED via [facility] with c/o nausea, vomiting and diarrhea.  Onset today after taking Azathioprine for the first time.  Pt st's she vomited multiple times and has had diarrhea multiple times.     Allergies Allergies  Allergen Reactions  . Ace Inhibitors Swelling  . Imuran [Azathioprine] Nausea And Vomiting    Severe N/V/D and AKI after starting the med.  . Lisinopril Swelling and Other (See Comments)    Other reaction(s): Angioedema (ALLERGY/intolerance), Face  . Tramadol Other (See Comments)    Other reaction(s): Mental Status Changes (intolerance)  . Iodinated Diagnostic Agents Swelling    When patient was in her 20's, swelled up all over after getting getting injection of contrast; did not have any other symptoms/ was given benadryl after that happened and had no further problems per patients/  . Iodine Swelling  . Janumet [Sitagliptin-Metformin Hcl] Other (See Comments)    Continuous yeast infection  . Victoza [Liraglutide] Nausea And Vomiting    Level of Care/Admitting Diagnosis ED Disposition    ED Disposition Condition Comment   Admit  Hospital Area: [facility] [[id number]]  Level of Care: Med-Surg [16]  I expect the patient will be discharged within 24 hours: Yes  LOW acuity---Tx typically complete <24 hrs---ACUTE conditions typically can be evaluated <24 hours---LABS likely to return to acceptable levels <24 hours---IS near functional baseline---EXPECTED to return to current living arrangement---NOT newly hypoxic: Meets criteria for 5C-Observation unit  Covid Evaluation: Asymptomatic Screening  Protocol (No Symptoms)  Diagnosis: Nausea vomiting and diarrhea [737106]  Admitting Physician: Etta Quill [[id number]]  Attending Physician: Etta Quill [[id number]]  PT Class (Do Not Modify): Observation [104]  PT Acc Code (Do Not Modify): Observation [10022]       B Medical/Surgery History Past Medical History:  Diagnosis Date  . Autoimmune hepatitis ([place])   . Diabetes mellitus without complication ([place])   . GERD (gastroesophageal reflux disease)   . Hypertension   . Lupus ([place])   . Morbid obesity ([place])   . Pneumonia   . Shortness of breath dyspnea   . Tobacco use disorder    Past Surgical History:  Procedure Laterality Date  . CHOLECYSTECTOMY    . DILATION AND CURETTAGE OF UTERUS    . EYE SURGERY Bilateral    laser surgery   . IR TRANSCATHETER BX  11/03/2018  . IR US GUIDE VASC ACCESS RIGHT  11/03/2018  . IR VENOGRAM HEPATIC W HEMODYNAMIC EVALUATION  11/03/2018     A IV Location/Drains/Wounds Patient Lines/Drains/Airways Status   Active Line/Drains/Airways    Name:   Placement date:   Placement time:   Site:   Days:   Peripheral IV 04/07/19 Left Forearm   04/07/19    1521    Forearm   68   Peripheral IV 06/13/19 Left Antecubital   06/13/19    -    Antecubital   1          Intake/Output Last 24 hours  Intake/Output Summary (Last 24 hours) at 06/14/2019 0230 Last data filed at 06/14/2019 0151 Gross per 24 hour  Intake 500 ml  Output -  Net  500 ml    Labs/Imaging Results for orders placed or performed during the hospital encounter of 06/13/19 (from the past 48 hour(s))  Comprehensive metabolic panel     Status: Abnormal   Collection Time: 06/13/19 11:52 PM  Result Value Ref Range   Sodium 140 135 - 145 mmol/L   Potassium 4.1 3.5 - 5.1 mmol/L    Comment: HEMOLYSIS AT THIS LEVEL MAY AFFECT RESULT   Chloride 107 98 - 111 mmol/L   CO2 20 (L) 22 - 32 mmol/L   Glucose, Bld 107 (H) 70 - 99 mg/dL   BUN 26 (H) 6 - 20 mg/dL   Creatinine, Ser 1.77 (H) 0.44 - 1.00  mg/dL   Calcium 8.6 (L) 8.9 - 10.3 mg/dL   Total Protein 8.4 (H) 6.5 - 8.1 g/dL   Albumin 3.4 (L) 3.5 - 5.0 g/dL   AST 83 (H) 15 - 41 U/L   ALT 64 (H) 0 - 44 U/L   Alkaline Phosphatase 34 (L) 38 - 126 U/L   Total Bilirubin 1.8 (H) 0.3 - 1.2 mg/dL   GFR calc non Af Amer 31 (L) >60 mL/min   GFR calc Af Amer 36 (L) >60 mL/min   Anion gap 13 5 - 15    Comment: Performed at [facility], [address], [place], [place] [address]  CBC with Differential     Status: Abnormal   Collection Time: 06/13/19 11:52 PM  Result Value Ref Range   WBC 13.1 (H) 4.0 - 10.5 K/uL   RBC 5.26 (H) 3.87 - 5.11 MIL/uL   Hemoglobin 17.2 (H) 12.0 - 15.0 g/dL   HCT 52.5 (H) 36.0 - 46.0 %   MCV 99.8 80.0 - 100.0 fL   MCH 32.7 26.0 - 34.0 pg   MCHC 32.8 30.0 - 36.0 g/dL   RDW 14.4 11.5 - 15.5 %   Platelets 216 150 - 400 K/uL   nRBC 0.3 (H) 0.0 - 0.2 %   Neutrophils Relative % 82 %   Neutro Abs 11.5 (H) 1.7 - 7.7 K/uL   Band Neutrophils 6 %   Lymphocytes Relative 3 %   Lymphs Abs 0.4 (L) 0.7 - 4.0 K/uL   Monocytes Relative 0 %   Monocytes Absolute 0.0 (L) 0.1 - 1.0 K/uL   Eosinophils Relative 0 %   Eosinophils Absolute 0.0 0.0 - 0.5 K/uL   Basophils Relative 1 %   Basophils Absolute 0.1 0.0 - 0.1 K/uL   WBC Morphology See Note     Comment: Mild Left Shift. 1 to 5% Metas and Myelos, Occ Pro Noted.   nRBC 1 (H) 0 /100 WBC   Metamyelocytes Relative 5 %   Myelocytes 3 %   Abs Immature Granulocytes 1.00 (H) 0.00 - 0.07 K/uL    Comment: Performed at [facility], [place] [address], [place], [place] [address]   No results found.  Pending Labs Unresulted Labs (From admission, onward)    Start     Ordered   06/14/19 5638  Basic metabolic panel  Tomorrow morning,   R     06/14/19 0214   06/14/19 0218  HIV antibody (Routine Testing)  Once,   STAT     06/14/19 0218   06/14/19 0214  Hemoglobin A1c  Once,   STAT    Comments: To assess prior glycemic control    06/14/19 0213   06/14/19 0202  SARS  Coronavirus 2 (CEPHEID - Performed in [facility] hospital lab), [place]  (  COVID Labs)  ONCE - STAT,   STAT    Question:  Pre-procedural testing  Answer:  Yes   06/14/19 0201          Vitals/Pain Today's Vitals   06/13/19 2221 06/13/19 2230 06/13/19 2300 06/13/19 2315  BP:  94/80 124/67 112/72  Pulse:  100 (!) 103 (!) 103  Resp:  (!) 24    Temp:      SpO2:  100% 98% 99%  Weight: (!) 138.3 kg     Height: _0  (1.676 m)     PainSc:        Isolation Precautions No active isolations  Medications Medications  sodium chloride 0.9 % bolus 1,000 mL (has no administration in time range)  nadolol (CORGARD) tablet 40 mg (has no administration in time range)  oxyCODONE (Oxy IR/ROXICODONE) immediate release tablet 10 mg (has no administration in time range)  predniSONE (DELTASONE) tablet 10 mg (has no administration in time range)  pantoprazole (PROTONIX) EC tablet 80 mg (has no administration in time range)  zolpidem (AMBIEN) tablet 5 mg (has no administration in time range)  pregabalin (LYRICA) capsule 100 mg (has no administration in time range)  magnesium oxide (MAG-OX) tablet 200 mg (has no administration in time range)  multivitamin with minerals tablet 1 tablet (has no administration in time range)  amLODipine (NORVASC) tablet 5 mg (has no administration in time range)  DULoxetine (CYMBALTA) DR capsule 60 mg (has no administration in time range)  fluticasone (FLONASE) 50 MCG/ACT nasal spray 1 spray (has no administration in time range)  acetaminophen (TYLENOL) tablet 650 mg (has no administration in time range)  insulin aspart (novoLOG) injection 0-15 Units (has no administration in time range)  0.9 %  sodium chloride infusion (has no administration in time range)  ondansetron (ZOFRAN) tablet 4 mg (has no administration in time range)    Or  ondansetron (ZOFRAN) injection 4 mg (has no administration in time range)  enoxaparin (LOVENOX) injection 40 mg (has no administration  in time range)  sodium chloride 0.9 % bolus 500 mL (0 mLs Intravenous Stopped 06/14/19 0151)  ondansetron (ZOFRAN) injection 4 mg (4 mg Intravenous Given 06/14/19 0003)    Mobility walks with person assist Low fall risk   Focused Assessments    R Recommendations: See Admitting Provider Note  Report given to:   Additional Notes:

## 2019-06-14 NOTE — Plan of Care (Signed)
  Problem: Education: Goal: Knowledge of General Education information will improve Description: Including pain rating scale, medication(s)/side effects and non-pharmacologic comfort measures Outcome: Progressing   Problem: Clinical Measurements: Goal: Ability to maintain clinical measurements within normal limits will improve Outcome: Progressing   Problem: Pain Managment: Goal: General experience of comfort will improve Outcome: Progressing

## 2019-06-14 NOTE — ED Notes (Signed)
ED Provider at bedside. 

## 2019-06-14 NOTE — H&P (Signed)
History and Physical    Monica Ellis GGY:[id number] DOB: April 05, 1961 DOA: 06/13/2019  PCP: Ferd Hibbs, NP  Patient coming from: Home  I have personally briefly reviewed patient's old medical records in [place]  Chief Complaint: N/V/D  HPI: Monica Ellis is a 58 y.o. female with medical history significant of SLE, DM2, HTN, and autoimmune hepatitis.  Patient was recently taken off of cellcept (was causing progressive generalized weakness which resolved when med was stopped) and switched to imuran just today for auto immune hepatitis.  Took first dose of this medication at 4pm in the Evening.  Since then she developed nausea, multiple episodes of vomiting, and diarrhea.  Came to ED.   ED Course: In ED no further episodes of vomiting nor diarrhea, but still with significant nausea.  Creat 1.77 up from 0.8 in May.   Review of Systems: As per HPI otherwise 10 point review of systems negative.   Past Medical History:  Diagnosis Date  . Autoimmune hepatitis ([place])   . Diabetes mellitus without complication ([place])   . GERD (gastroesophageal reflux disease)   . Hypertension   . Lupus ([place])   . Morbid obesity ([place])   . Pneumonia   . Shortness of breath dyspnea   . Tobacco use disorder     Past Surgical History:  Procedure Laterality Date  . CHOLECYSTECTOMY    . DILATION AND CURETTAGE OF UTERUS    . EYE SURGERY Bilateral    laser surgery   . IR TRANSCATHETER BX  11/03/2018  . IR US GUIDE VASC ACCESS RIGHT  11/03/2018  . IR VENOGRAM HEPATIC W HEMODYNAMIC EVALUATION  11/03/2018     reports that she has been smoking cigarettes. She has a 30.00 pack-year smoking history. She has never used smokeless tobacco. She reports that she does not drink alcohol or use drugs.  Allergies  Allergen Reactions  . Ace Inhibitors Swelling  . Imuran [Azathioprine] Nausea And Vomiting    Severe N/V/D and AKI after starting the med.  . Lisinopril Swelling and Other (See  Comments)    Other reaction(s): Angioedema (ALLERGY/intolerance), Face  . Tramadol Other (See Comments)    Other reaction(s): Mental Status Changes (intolerance)  . Iodinated Diagnostic Agents Swelling    When patient was in her 20's, swelled up all over after getting getting injection of contrast; did not have any other symptoms/ was given benadryl after that happened and had no further problems per patients/  . Iodine Swelling  . Janumet [Sitagliptin-Metformin Hcl] Other (See Comments)    Continuous yeast infection  . Victoza [Liraglutide] Nausea And Vomiting    Family History  Problem Relation Age of Onset  . Diabetes Father   . Hypertension Father   . Hypertension Mother      Prior to Admission medications   Medication Sig Start Date End Date Taking? Authorizing Provider  acetaminophen (TYLENOL) 325 MG tablet Take 2 tablets (650 mg total) by mouth every 4 (four) hours as needed for mild pain, fever or headache. 06/09/17  Yes Robbie Lis, MD  amLODipine (NORVASC) 5 MG tablet Take 5 mg by mouth every evening. 06/08/19  Yes [provider]  Cholecalciferol (VITAMIN D PO) Take 1 tablet by mouth every evening.    Yes [provider]  DULoxetine (CYMBALTA) 60 MG capsule Take 60 mg by mouth at bedtime.  08/26/16  Yes [provider]  fluticasone (FLONASE) 50 MCG/ACT nasal spray Place 1 spray into both nostrils daily. 02/11/17  Yes Caudill,  Gwynneth Aliment, MD  insulin lispro protamine-lispro (HUMALOG 75/25 MIX) (75-25) 100 UNIT/ML SUSP injection Inject 90 Units into the skin 3 (three) times daily with meals.   Yes [provider]  LYRICA 100 MG capsule Take 100 mg by mouth at bedtime. 01/23/18  Yes [provider]  magnesium 30 MG tablet Take 30 mg by mouth at bedtime.    Yes [provider]  Multiple Vitamin (MULTIVITAMIN) tablet Take 1 tablet by mouth at bedtime.    Yes [provider]  nadolol (CORGARD) 40 MG tablet Take 40  mg by mouth every evening.  09/16/16  Yes [provider]  Omega-3 Fatty Acids (FISH OIL PO) Take 1 capsule by mouth every evening.    Yes [provider]  omeprazole (PRILOSEC) 40 MG capsule Take 40 mg by mouth every evening.  06/03/17  Yes [provider]  Oxycodone HCl 10 MG TABS Take 10 mg by mouth every 6 (six) hours as needed (pain).   Yes [provider]  predniSONE (DELTASONE) 5 MG tablet Take 10 mg by mouth daily.   Yes [provider]  TURMERIC PO Take 400 mg by mouth every evening.    Yes [provider]  zolpidem (AMBIEN) 10 MG tablet Take 10 mg by mouth at bedtime. 05/15/19  Yes [provider]    Physical Exam: Vitals:   06/13/19 2221 06/13/19 2230 06/13/19 2300 06/13/19 2315  BP:  94/80 124/67 112/72  Pulse:  100 (!) 103 (!) 103  Resp:  (!) 24    Temp:      SpO2:  100% 98% 99%  Weight: (!) 138.3 kg     Height: _0  (1.676 m)       Constitutional: NAD, calm, comfortable Eyes: PERRL, lids and conjunctivae normal ENMT: Mucous membranes are moist. Posterior pharynx clear of any exudate or lesions.Normal dentition.  Neck: normal, supple, no masses, no thyromegaly Respiratory: clear to auscultation bilaterally, no wheezing, no crackles. Normal respiratory effort. No accessory muscle use.  Cardiovascular: Regular rate and rhythm, no murmurs / rubs / gallops. No extremity edema. 2+ pedal pulses. No carotid bruits.  Abdomen: no tenderness, no masses palpated. No hepatosplenomegaly. Bowel sounds positive.  Musculoskeletal: no clubbing / cyanosis. No joint deformity upper and lower extremities. Good ROM, no contractures. Normal muscle tone.  Skin: no rashes, lesions, ulcers. No induration Neurologic: CN 2-12 grossly intact. Sensation intact, DTR normal. Strength 5/5 in all 4.  Psychiatric: Normal judgment and insight. Alert and oriented x 3. Normal mood.    Labs on Admission: I have personally reviewed following labs  and imaging studies  CBC: Recent Labs  Lab 06/13/19 2352  WBC 13.1*  NEUTROABS 11.5*  HGB 17.2*  HCT 52.5*  MCV 99.8  PLT 413   Basic Metabolic Panel: Recent Labs  Lab 06/13/19 2352  NA 140  K 4.1  CL 107  CO2 20*  GLUCOSE 107*  BUN 26*  CREATININE 1.77*  CALCIUM 8.6*   GFR: Estimated Creatinine Clearance: 48.5 mL/min (A) (by C-G formula based on SCr of 1.77 mg/dL (H)). Liver Function Tests: Recent Labs  Lab 06/13/19 2352  AST 83*  ALT 64*  ALKPHOS 34*  BILITOT 1.8*  PROT 8.4*  ALBUMIN 3.4*   No results for input(s): LIPASE, AMYLASE in the last 168 hours. No results for input(s): AMMONIA in the last 168 hours. Coagulation Profile: No results for input(s): INR, PROTIME in the last 168 hours. Cardiac Enzymes: No results for input(s): CKTOTAL,  CKMB, CKMBINDEX, TROPONINI in the last 168 hours. BNP (last 3 results) No results for input(s): PROBNP in the last 8760 hours. HbA1C: No results for input(s): HGBA1C in the last 72 hours. CBG: No results for input(s): GLUCAP in the last 168 hours. Lipid Profile: No results for input(s): CHOL, HDL, LDLCALC, TRIG, CHOLHDL, LDLDIRECT in the last 72 hours. Thyroid Function Tests: No results for input(s): TSH, T4TOTAL, FREET4, T3FREE, THYROIDAB in the last 72 hours. Anemia Panel: No results for input(s): VITAMINB12, FOLATE, FERRITIN, TIBC, IRON, RETICCTPCT in the last 72 hours. Urine analysis:    Component Value Date/Time   COLORURINE YELLOW 04/07/2019 [place] 04/07/2019 1524   LABSPEC 1.019 04/07/2019 1524   PHURINE 6.0 04/07/2019 1524   GLUCOSEU NEGATIVE 04/07/2019 1524   HGBUR NEGATIVE 04/07/2019 1524   [place] 04/07/2019 1524   [place] 04/07/2019 1524   PROTEINUR 100 (A) 04/07/2019 1524   NITRITE NEGATIVE 04/07/2019 [place] 04/07/2019 1524    Radiological Exams on Admission: No results found.  EKG: Independently  reviewed.  Assessment/Plan Principal Problem:   Nausea vomiting and diarrhea Active Problems:   Chronic pain   Diabetes mellitus with complication (H[facility])   Autoimmune hepatitis ([place])   Essential hypertension   Class 3 severe obesity due to excess calories with serious comorbidity and body mass index (BMI) of 40.0 to 44.9 in adult ([place])    1. N/V/D - secondary to reaction to imuran 1. Stopping imuran 2. Zofran PRN 2. AKI - presumably secondary to above 1. IVF: NS at 150 cc/hr 2. Strict intake and output 3. Repeat BMP in AM 4. UA ordered and pending 5. If not improving with hydration and stopping imuran, then may need further work up (Lupus nephritis? though dont see shes had a history of SLE nephritis ever that I can see despite carying the SLE diagnosis for many years, wasn't on immunosuppressives at all until this past year it looks like) 3. Autoimmune hepatitis / SLE - 1. Continue prednisone 2. outpt provider likely to restart cellcept 4. DM2 - 1. Holding home 75/25 for the moment 2. Mod scale SSI Q4H 3. Resume home meds instead once she is eating 5. HTN - continue home BP meds 6. Chronic pain - continue home oxycodone PRN  DVT prophylaxis: Lovenox Code Status: Full Family Communication: No family in room Disposition Plan: Home after admit Consults called: None Admission status: Place in 66   Elgene Coral, [place] [facility]  How to contact the [facility] Attending or Consulting provider [place] or covering provider during after hours [place], for this patient?  1. Check the care team in [facility] and look for a) attending/consulting [facility] provider listed and b) the [facility] team listed 2. Log into [email or web address]  Amion Physician Scheduling and messaging for groups and whole hospitals  On call and physician scheduling software for group practices, residents, hospitalists and other medical providers for call, clinic, rotation and shift schedules. On[employer] is a hospital-wide  system for scheduling doctors and paging doctors on call. EasyPlot is for scientific plotting and data analysis.  [email or web address]  and use [place]'s universal password to access. If you do not have the password, please contact the hospital operator.  3. Locate the [facility] provider you are looking for under [facility] and page to a number that you can be directly reached. 4. If you still have difficulty reaching the provider, please page the [facility] (Director on Call) for the  Hospitalists listed on amion for assistance.  06/14/2019, 2:27 AM

## 2019-06-15 DIAGNOSIS — G894 Chronic pain syndrome: Secondary | ICD-10-CM | POA: Diagnosis not present

## 2019-06-15 DIAGNOSIS — K754 Autoimmune hepatitis: Secondary | ICD-10-CM | POA: Diagnosis not present

## 2019-06-15 DIAGNOSIS — N179 Acute kidney failure, unspecified: Secondary | ICD-10-CM | POA: Diagnosis not present

## 2019-06-15 DIAGNOSIS — R112 Nausea with vomiting, unspecified: Secondary | ICD-10-CM | POA: Diagnosis not present

## 2019-06-15 LAB — COMPREHENSIVE METABOLIC PANEL
ALT: 61 U/L — ABNORMAL HIGH (ref 0–44)
AST: 37 U/L (ref 15–41)
Albumin: 3.1 g/dL — ABNORMAL LOW (ref 3.5–5.0)
Alkaline Phosphatase: 34 U/L — ABNORMAL LOW (ref 38–126)
Anion gap: 10 (ref 5–15)
BUN: 26 mg/dL — ABNORMAL HIGH (ref 6–20)
CO2: 21 mmol/L — ABNORMAL LOW (ref 22–32)
Calcium: 7.6 mg/dL — ABNORMAL LOW (ref 8.9–10.3)
Chloride: 108 mmol/L (ref 98–111)
Creatinine, Ser: 1.15 mg/dL — ABNORMAL HIGH (ref 0.44–1.00)
GFR calc Af Amer: 60 mL/min — ABNORMAL LOW (ref >60)
GFR calc non Af Amer: 52 mL/min — ABNORMAL LOW (ref >60)
Glucose, Bld: 142 mg/dL — ABNORMAL HIGH (ref 70–99)
Potassium: 3.4 mmol/L — ABNORMAL LOW (ref 3.5–5.1)
Sodium: 139 mmol/L (ref 135–145)
Total Bilirubin: 1 mg/dL (ref 0.3–1.2)
Total Protein: 7.9 g/dL (ref 6.5–8.1)

## 2019-06-15 LAB — CBC WITH DIFFERENTIAL/PLATELET
Abs Immature Granulocytes: 0.1 10*3/uL — ABNORMAL HIGH (ref 0.00–0.07)
Basophils Absolute: 0 10*3/uL (ref 0.0–0.1)
Basophils Relative: 0 %
Eosinophils Absolute: 0.2 10*3/uL (ref 0.0–0.5)
Eosinophils Relative: 2 %
HCT: 43.6 % (ref 36.0–46.0)
Hemoglobin: 14 g/dL (ref 12.0–15.0)
Immature Granulocytes: 1 %
Lymphocytes Relative: 23 %
Lymphs Abs: 2.4 10*3/uL (ref 0.7–4.0)
MCH: 32 pg (ref 26.0–34.0)
MCHC: 32.1 g/dL (ref 30.0–36.0)
MCV: 99.5 fL (ref 80.0–100.0)
Monocytes Absolute: 0.8 10*3/uL (ref 0.1–1.0)
Monocytes Relative: 8 %
Neutro Abs: 6.8 10*3/uL (ref 1.7–7.7)
Neutrophils Relative %: 66 %
Platelets: 166 10*3/uL (ref 150–400)
RBC: 4.38 MIL/uL (ref 3.87–5.11)
RDW: 14.4 % (ref 11.5–15.5)
WBC: 10.4 10*3/uL (ref 4.0–10.5)
nRBC: 0 % (ref 0.0–0.2)

## 2019-06-15 LAB — GLUCOSE, CAPILLARY
Glucose-Capillary: 128 mg/dL — ABNORMAL HIGH (ref 70–99)
Glucose-Capillary: 135 mg/dL — ABNORMAL HIGH (ref 70–99)
Glucose-Capillary: 143 mg/dL — ABNORMAL HIGH (ref 70–99)
Glucose-Capillary: 152 mg/dL — ABNORMAL HIGH (ref 70–99)
Glucose-Capillary: 215 mg/dL — ABNORMAL HIGH (ref 70–99)

## 2019-06-15 LAB — HEMOGLOBIN A1C
Hgb A1c MFr Bld: 6.3 % — ABNORMAL HIGH (ref 4.8–5.6)
Mean Plasma Glucose: 134 mg/dL

## 2019-06-15 MED ORDER — POTASSIUM CHLORIDE CRYS ER 20 MEQ PO TBCR
40.0000 meq | EXTENDED_RELEASE_TABLET | Freq: Once | ORAL | Status: AC
  Administered 2019-06-15: 40 meq via ORAL
  Filled 2019-06-15: qty 2

## 2019-06-15 MED ORDER — INSULIN ASPART 100 UNIT/ML ~~LOC~~ SOLN
0.0000 [IU] | Freq: Three times a day (TID) | SUBCUTANEOUS | Status: DC
  Administered 2019-06-15: 13:00:00 5 [IU] via SUBCUTANEOUS

## 2019-06-15 MED ORDER — PROCHLORPERAZINE EDISYLATE 10 MG/2ML IJ SOLN
10.0000 mg | Freq: Once | INTRAMUSCULAR | Status: DC
  Filled 2019-06-15: qty 2

## 2019-06-15 NOTE — Evaluation (Signed)
Occupational Therapy Evaluation Patient Details Name: Monica Ellis MRN: [id number] DOB: 18-May-1961 Today's Date: 06/15/2019    History of Present Illness Pt is a 58 y.o. F with significant PMH of SLE, DM2, HTN, and autoimmune hepatitis. Pt recently taken off Cellcept (causing progressive generalized weakness) and switched to Imuran for auto immune hepatitis. Since first dose of medication, pt presents with nausea, vomiting and diarrhea and subsequent acute kidney injury.   Clinical Impression   PTA Pt was mod I for ADL with Rollator and shower chair- although increasing struggle daily. Pt today is supervision for transfers/mobility with RW. Mod A for managing LB ADL set up for UB ADL and fatigues quickly. Requires sitting for many ADL. Energy conservation education initiated, and she will require skilled OT acutely and post-acute at the [facility] level. She really wants an aide to come in and assist with ADL - explained that OT would look more at getting her to be able to do it on her own. OT will follow acutely. Bring EC handout next session.    Follow Up Recommendations  Home health OT    Equipment Recommendations  None recommended by OT(Pt has appropriate DME)    Recommendations for Other Services       Precautions / Restrictions Precautions Precautions: Fall Restrictions Weight Bearing Restrictions: No      Mobility Bed Mobility Overal bed mobility: Needs Assistance Bed Mobility: Supine to Sit;Sit to Supine     Supine to sit: Supervision Sit to supine: Supervision   General bed mobility comments: use of bed rails  Transfers Overall transfer level: Needs assistance Equipment used: Rolling walker (2 wheeled) Transfers: Sit to/from Stand Sit to Stand: Supervision         General transfer comment: Cues for hand placement    Balance Overall balance assessment: Needs assistance Sitting-balance support: Feet supported Sitting balance-Leahy Scale: Good      Standing balance support: Bilateral upper extremity supported Standing balance-Leahy Scale: Fair Standing balance comment: reliant on walker in standing                           ADL either performed or assessed with clinical judgement   ADL Overall ADL's : Needs assistance/impaired Eating/Feeding: Set up;Sitting   Grooming: Wash/dry hands;Wash/dry face;Set up;Sitting Grooming Details (indicate cue type and reason): at sink, on BSC Upper Body Bathing: Set up;Sitting Upper Body Bathing Details (indicate cue type and reason): has a shower chair at home Lower Body Bathing: Moderate assistance;Sitting/lateral leans Lower Body Bathing Details (indicate cue type and reason): educated on long handle sponge Upper Body Dressing : Set up;Sitting   Lower Body Dressing: Sit to/from stand;Moderate assistance   Toilet Transfer: Min guard;Ambulation;RW Toilet Transfer Details (indicate cue type and reason): increased time and effort Toileting- Water quality scientist and Hygiene: Min guard;Sit to/from stand Toileting - Clothing Manipulation Details (indicate cue type and reason): use of grab bars for balance     Functional mobility during ADLs: Supervision/safety;Rolling walker General ADL Comments: Pt asking about CNA/aide for assist, educated in energy conservation techniques     Vision         Perception     Praxis      Pertinent Vitals/Pain Pain Assessment: 0-10 Pain Score: 8  Pain Location: head Pain Descriptors / Indicators: Aching Pain Intervention(s): Monitored during session;Repositioned     Hand Dominance     Extremity/Trunk Assessment Upper Extremity Assessment Upper Extremity Assessment: Generalized weakness   Lower Extremity Assessment  Lower Extremity Assessment: Defer to PT evaluation   Cervical / Trunk Assessment Cervical / Trunk Assessment: Other exceptions Cervical / Trunk Exceptions: increased body habitus   Communication  Communication Communication: No difficulties   Cognition Arousal/Alertness: Awake/alert Behavior During Therapy: WFL for tasks assessed/performed Overall Cognitive Status: Within Functional Limits for tasks assessed                                     General Comments       Exercises     Shoulder Instructions      Home Living Family/patient expects to be discharged to:: Private residence Living Arrangements: Parent(mother) Available Help at Discharge: Family;Available PRN/intermittently;Friend(s) Type of Home: House Home Access: Stairs to enter [employer] of Steps: 5 Entrance Stairs-Rails: Left Home Layout: One level     Bathroom Shower/Tub: Teacher, early years/pre: Standard     Home Equipment: Environmental consultant - 4 wheels;Cane - single point;Shower seat          Prior Functioning/Environment Level of Independence: Needs assistance  Gait / Transfers Assistance Needed: progressive weakness since starting Cellcept 4 months ago. Has been a limited household ambulator with cane and using Rollator to "roll from room to room."  ADL's / Homemaking Assistance Needed: Independent with ADL's but reports increased difficulty.    Comments: Used to enjoy doing martial arts        OT Problem List: Decreased strength;Decreased activity tolerance;Impaired balance (sitting and/or standing);Decreased knowledge of use of DME or AE;Obesity      OT Treatment/Interventions: Self-care/ADL training;Energy conservation;Therapeutic exercise;Therapeutic activities;Patient/family education;Balance training    OT Goals(Current goals can be found in the care plan section) Acute Rehab OT Goals Patient Stated Goal: "To get an aide for home use." OT Goal Formulation: With patient Time For Goal Achievement: 06/29/19 Potential to Achieve Goals: Good ADL Goals Pt Will Perform Grooming: with modified independence;standing Pt Will Perform Upper Body Dressing: with  modified independence;sitting Pt Will Perform Lower Body Dressing: with modified independence;sit to/from stand Pt Will Transfer to Toilet: with modified independence;ambulating Pt Will Perform Toileting - Clothing Manipulation and hygiene: with modified independence;sit to/from stand Pt Will Perform Tub/Shower Transfer: Tub transfer;with supervision;ambulating;shower seat;rolling walker Additional ADL Goal #1: Pt will recall 3 ways of conserving energy during ADL at independent level  OT Frequency: Min 2X/week   Barriers to D/C:            Co-evaluation              AM-PAC OT "6 Clicks" Daily Activity     Outcome Measure Help from another person eating meals?: None Help from another person taking care of personal grooming?: A Little Help from another person toileting, which includes using toliet, bedpan, or urinal?: A Little Help from another person bathing (including washing, rinsing, drying)?: A Little Help from another person to put on and taking off regular upper body clothing?: A Little Help from another person to put on and taking off regular lower body clothing?: A Lot 6 Click Score: 18   End of Session Equipment Utilized During Treatment: Gait belt;Rolling walker Nurse Communication: Mobility status;Precautions  Activity Tolerance: Patient tolerated treatment well Patient left: in bed;with call bell/phone within reach  OT Visit Diagnosis: Unsteadiness on feet (R26.81);Other abnormalities of gait and mobility (R26.89);Muscle weakness (generalized) (M62.81)                Time: 2841-3244  OT Time Calculation (min): 23 min Charges:  OT General Charges $OT Visit: 1 Visit OT Evaluation $OT Eval Moderate Complexity: 1 Mod OT Treatments $Self Care/Home Management : 8-22 mins  Hulda Humphrey OTR/L Acute Rehabilitation Services Pager: [phone] Office: [phone]  Merri Ray Briony Parveen 06/15/2019, 2:04 PM

## 2019-06-15 NOTE — Discharge Summary (Signed)
Physician Discharge Summary  Monica Ellis TKW:[id number] DOB: Jun 06, 1961 DOA: 06/13/2019  PCP: Ferd Hibbs, NP  Admit date: 06/13/2019 Discharge date: 06/15/2019  Time spent: 60 minutes  Recommendations for Outpatient Follow-up:  1. Follow-up with Ferd Hibbs, NP in 1 week.  On follow-up patient will need a basic metabolic profile done to follow-up on electrolytes and renal function. 2. Follow-up with hepatologist for further recommendations on treatment for autoimmune hepatitis as patient unable to tolerate Imuran.   Discharge Diagnoses:  Principal Problem:   Nausea vomiting and diarrhea Active Problems:   Chronic pain   Diabetes mellitus with complication (HCC)   Autoimmune hepatitis ([place])   Essential hypertension   Class 3 severe obesity due to excess calories with serious comorbidity and body mass index (BMI) of 40.0 to 44.9 in adult [facility])   AKI (acute kidney injury) ([place])   Discharge Condition: Stable and improved  Diet recommendation: Carb modified  Filed Weights   06/13/19 2221 06/14/19 0300  Weight: (!) 138.3 kg (!) 138.3 kg    History of present illness:  HPI per Dr. Kirk Ruths Ellis is a 58 y.o. female with medical history significant of SLE, DM2, HTN, and autoimmune hepatitis.  Patient was recently taken off of cellcept (was causing progressive generalized weakness which resolved when med was stopped) and switched to imuran just today for auto immune hepatitis.  Took first dose of this medication at 4pm in the Evening.  Since then she developed nausea, multiple episodes of vomiting, and diarrhea.  Came to ED.   ED Course: In ED no further episodes of vomiting nor diarrhea, but still with significant nausea.  Creat 1.77 up from 0.8 in May.  Hospital Course:  1 nausea/ vomiting /diarrhea Likely secondary to reaction to Imuran.  Imuran was discontinued during the hospitalization.  Patient was placed on IV fluids, clear liquid diet,  supportive care monitored.  Patient improved clinically during the hospitalization did not have any further nausea vomiting or diarrhea.  Diet was advanced to full liquid diet which patient tolerated and subsequently a soft diet which patient tolerated.  Patient was hydrated with IV fluids and was euvolemic by day of discharge.  Patient was discharged home in stable and improved condition.  Outpatient follow-up with PCP.  2.  Acute kidney injury Felt secondary to a prerenal azotemia secondary to problem #1.  Patient was hydrated aggressively with IV fluids with improvement with renal function.  Patient's creatinine went up as high as 2.01 and subsequently improved to 1.15 by day of discharge.  Outpatient follow-up with PCP.  3.  Hypokalemia Repleted.  Outpatient follow-up.  4.  Autoimmune hepatitis/SLE  Patient maintained on home regimen of prednisone.  Patient CellCept was discontinued and patient was tried on Imuran however due to intractable nausea vomiting diarrhea to Imuran Imuran was discontinued during this hospitalization.  Outpatient follow-up with hepatologist.  Patient may need to be restarted back on CellCept however will defer to the outpatient setting.  5.  Diabetes mellitus type 2 Patient 75/25 was held during this hospitalization and patient maintained on a sliding scale insulin every 4 hours.  Patient's blood glucose remained controlled.  Patient's insulin regimen will be resumed on discharge.  Outpatient follow-up.  6.  Hypertension Remained stable.  Patient maintained on home regimen of antihypertensive medications.  7.  Chronic pain Patient maintained on home regimen of oxycodone as needed.  Procedures:  None  Consultations:  None  Discharge Exam: Vitals:   06/14/19 2334 06/15/19 0404  BP:  121/75 139/79  Pulse: 79 72  Resp: 14 16  Temp: 98.2 F (36.8 C) 98.3 F (36.8 C)  SpO2: 97% 93%    General: NAD Cardiovascular: RRR Respiratory: CTAB  Discharge  Instructions   Discharge Instructions    Diet Carb Modified   Complete by: As directed    Increase activity slowly   Complete by: As directed      Allergies as of 06/15/2019      Reactions   Ace Inhibitors Swelling   Imuran [azathioprine] Nausea And Vomiting   Severe N/V/D and AKI after starting the med.   Lisinopril Swelling, Other (See Comments)   Other reaction(s): Angioedema (ALLERGY/intolerance), Face   Tramadol Other (See Comments)   Other reaction(s): Mental Status Changes (intolerance)   Iodinated Diagnostic Agents Swelling   When patient was in her 20's, swelled up all over after getting getting injection of contrast; did not have any other symptoms/ was given benadryl after that happened and had no further problems per patients/   Iodine Swelling   Janumet [sitagliptin-metformin Hcl] Other (See Comments)   Continuous yeast infection   Victoza [liraglutide] Nausea And Vomiting      Medication List    STOP taking these medications   acetaminophen 325 MG tablet Commonly known as: TYLENOL     TAKE these medications   amLODipine 5 MG tablet Commonly known as: NORVASC Take 5 mg by mouth every evening.   DULoxetine 60 MG capsule Commonly known as: CYMBALTA Take 60 mg by mouth at bedtime.   FISH OIL PO Take 1 capsule by mouth every evening.   fluticasone 50 MCG/ACT nasal spray Commonly known as: FLONASE Place 1 spray into both nostrils daily.   insulin lispro protamine-lispro (75-25) 100 UNIT/ML Susp injection Commonly known as: HUMALOG 75/25 MIX Inject 90 Units into the skin 3 (three) times daily with meals.   Lyrica 100 MG capsule Generic drug: pregabalin Take 100 mg by mouth at bedtime.   magnesium 30 MG tablet Take 30 mg by mouth at bedtime.   multivitamin tablet Take 1 tablet by mouth at bedtime.   nadolol 40 MG tablet Commonly known as: CORGARD Take 40 mg by mouth every evening.   omeprazole 40 MG capsule Commonly known as: PRILOSEC Take 40  mg by mouth every evening.   Oxycodone HCl 10 MG Tabs Take 10 mg by mouth every 6 (six) hours as needed (pain).   predniSONE 5 MG tablet Commonly known as: DELTASONE Take 10 mg by mouth daily.   TURMERIC PO Take 400 mg by mouth every evening.   VITAMIN D PO Take 1 tablet by mouth every evening.   zolpidem 10 MG tablet Commonly known as: AMBIEN Take 10 mg by mouth at bedtime.            Durable Medical Equipment  (From admission, onward)         Start     Ordered   06/15/19 0737  For home use only DME 4 wheeled rolling walker with seat  Once    Question:  Patient needs a walker to treat with the following condition  Answer:  Debility   06/15/19 0737         Allergies  Allergen Reactions  . Ace Inhibitors Swelling  . Imuran [Azathioprine] Nausea And Vomiting    Severe N/V/D and AKI after starting the med.  . Lisinopril Swelling and Other (See Comments)    Other reaction(s): Angioedema (ALLERGY/intolerance), Face  . Tramadol Other (See Comments)  Other reaction(s): Mental Status Changes (intolerance)  . Iodinated Diagnostic Agents Swelling    When patient was in her 20's, swelled up all over after getting getting injection of contrast; did not have any other symptoms/ was given benadryl after that happened and had no further problems per patients/  . Iodine Swelling  . Janumet [Sitagliptin-Metformin Hcl] Other (See Comments)    Continuous yeast infection  . Victoza [Liraglutide] Nausea And Vomiting   Follow-up Information    Ferd Hibbs, NP. Schedule an appointment as soon as possible for a visit in 1 week(s).   Specialty: Nurse Practitioner Why: As soon as possible for follow up Contact information: [place] [address] [phone]        [facility], [place] Follow up.   Specialty: Home Health Services [phone]           The results of significant diagnostics from this hospitalization (including  imaging, microbiology, ancillary and laboratory) are listed below for reference.    Significant Diagnostic Studies: No results found.  Microbiology: Recent Results (from the past 240 hour(s))  SARS Coronavirus 2 (CEPHEID - Performed in [place] hospital lab), Hosp Order     Status: None   Collection Time: 06/14/19  2:02 AM   Specimen: Nasopharyngeal Swab  Result Value Ref Range Status   SARS Coronavirus 2 NEGATIVE NEGATIVE Final    Comment: (NOTE) If result is NEGATIVE SARS-CoV-2 target nucleic acids are NOT DETECTED. The SARS-CoV-2 RNA is generally detectable in upper and lower  respiratory specimens during the acute phase of infection. The lowest  concentration of SARS-CoV-2 viral copies this assay can detect is 250  copies / mL. A negative result does not preclude SARS-CoV-2 infection  and should not be used as the sole basis for treatment or other  patient management decisions.  A negative result may occur with  improper specimen collection / handling, submission of specimen other  than nasopharyngeal swab, presence of viral mutation(s) within the  areas targeted by this assay, and inadequate number of viral copies  (<250 copies / mL). A negative result must be combined with clinical  observations, patient history, and epidemiological information. If result is POSITIVE SARS-CoV-2 target nucleic acids are DETECTED. The SARS-CoV-2 RNA is generally detectable in upper and lower  respiratory specimens dur ing the acute phase of infection.  Positive  results are indicative of active infection with SARS-CoV-2.  Clinical  correlation with patient history and other diagnostic information is  necessary to determine patient infection status.  Positive results do  not rule out bacterial infection or co-infection with other viruses. If result is PRESUMPTIVE POSTIVE SARS-CoV-2 nucleic acids MAY BE PRESENT.   A presumptive positive result was obtained on the submitted specimen  and  confirmed on repeat testing.  While 2019 novel coronavirus  (SARS-CoV-2) nucleic acids may be present in the submitted sample  additional confirmatory testing may be necessary for epidemiological  and / or clinical management purposes  to differentiate between  SARS-CoV-2 and other Sarbecovirus currently known to infect humans.  If clinically indicated additional testing with an alternate test  methodology [phone]) is advised. The SARS-CoV-2 RNA is generally  detectable in upper and lower respiratory sp ecimens during the acute  phase of infection. The expected result is Negative. Fact Sheet for Patients:  StrictlyIdeas.no Fact Sheet for Healthcare Providers: BankingDealers.co.za This test is not yet approved or cleared by the [place] FDA and has been authorized for detection  and/or diagnosis of SARS-CoV-2 by FDA under an Emergency Use Authorization (EUA).  This EUA will remain in effect (meaning this test can be used) for the duration of the COVID-19 declaration under Section 564(b)(1) of the Act, 21 U.S.C. section 360bbb-3(b)(1), unless the authorization is terminated or revoked sooner. Performed at [facility], [place] [address], [place], [place] [address]      Labs: Basic Metabolic Panel: Recent Labs  Lab 06/13/19 2352 06/14/19 0230 06/15/19 0350  NA 140 137 139  K 4.1 3.4* 3.4*  CL 107 104 108  CO2 20* 21* 21*  GLUCOSE 107* 143* 142*  BUN 26* 31* 26*  CREATININE 1.77* 2.01* 1.15*  CALCIUM 8.6* 8.8* 7.6*   Liver Function Tests: Recent Labs  Lab 06/13/19 2352 06/15/19 0350  AST 83* 37  ALT 64* 61*  ALKPHOS 34* 34*  BILITOT 1.8* 1.0  PROT 8.4* 7.9  ALBUMIN 3.4* 3.1*   No results for input(s): LIPASE, AMYLASE in the last 168 hours. No results for input(s): AMMONIA in the last 168 hours. CBC: Recent Labs  Lab 06/13/19 2352 06/15/19 0350  WBC 13.1* 10.4  NEUTROABS 11.5* 6.8  HGB 17.2* 14.0  HCT  52.5* 43.6  MCV 99.8 99.5  PLT 216 166   Cardiac Enzymes: No results for input(s): CKTOTAL, CKMB, CKMBINDEX, TROPONINI in the last 168 hours. BNP: BNP (last 3 results) No results for input(s): BNP in the last 8760 hours.  ProBNP (last 3 results) No results for input(s): PROBNP in the last 8760 hours.  CBG: Recent Labs  Lab 06/14/19 2022 06/15/19 0008 06/15/19 0202 06/15/19 0359 06/15/19 0736  GLUCAP 128* 128* 143* 152* 135*       Signed:  Irine Seal MD.  [facility] 06/15/2019, 11:33 AM

## 2019-06-15 NOTE — TOC Initial Note (Addendum)
Transition of Care [facility]) - Initial/Assessment Note    Patient Details  Name: Monica Ellis MRN: [id number] Date of Birth: 03/13/1961  Transition of Care [facility]) CM/SW Contact:    Marilu Favre, RN Phone Number: 06/15/2019, 11:22 AM  Clinical Narrative:                 Patient from home with her 60 year old mother. Has a daughter close by.  PT recommending walker and wheelchair. Patient does not want wheel chair. MD ordered Rolator ( 4 wheeled walker with seat) patient in agreement.   Spoke with Zack with [facility] , he does not have a rolator for patient weight in stock at hospital. Patient can go to [employer] store on [address] to pick up or [employer] can ship it to her which will take 2 to 3 days. Patient wants rolator shipped.   Confirmed face sheet information. Patient has no preference in home health agency.  Dan with [facility] checking to see if he can accept referral. Dan accepted referral.  Expected Discharge Plan: [place] Barriers to Discharge: No Barriers Identified   Patient Goals and CMS Choice Patient states their goals for this hospitalization and ongoing recovery are:: to go home CMS Medicare.gov Compare Post Acute Care list provided to:: Patient Choice offered to / list presented to : Patient  Expected Discharge Plan and Services Expected Discharge Plan: [place]   Discharge Planning Services: CM Consult   Living arrangements for the past 2 months: Single Family Home                 DME Arranged: Walker rolling with seat DME Agency: [employer] Date DME Agency Contacted: 06/15/19 Time DME Agency Contacted: [phone] Representative spoke with at DME Agency: [place]: PT, OT [place] Agency: [place] ([place]) Date [place]: 06/15/19 Time HH Agency Contacted: [phone]    Prior Living Arrangements/Services Living arrangements for the past 2 months: [place] Lives  with:: Parents Patient language and need for interpreter reviewed:: Yes Do you feel safe going back to the place where you live?: Yes      Need for Family Participation in Patient Care: Yes (Comment) Care giver support system in place?: Yes (comment)   Criminal Activity/Legal Involvement Pertinent to Current Situation/Hospitalization: Yes - Comment as needed  Activities of Daily Living Home Assistive Devices/Equipment: None ADL Screening (condition at time of admission) Patient's cognitive ability adequate to safely complete daily activities?: Yes Is the patient deaf or have difficulty hearing?: No Does the patient have difficulty seeing, even when wearing glasses/contacts?: No Does the patient have difficulty concentrating, remembering, or making decisions?: No Patient able to express need for assistance with ADLs?: Yes Does the patient have difficulty dressing or bathing?: No Independently performs ADLs?: Yes (appropriate for developmental age) Does the patient have difficulty walking or climbing stairs?: No Weakness of Legs: None Weakness of Arms/Hands: None  Permission Sought/Granted   Permission granted to share information with : Yes, Verbal Permission Granted  Share Information with NAME: [employer]           Emotional Assessment Appearance:: Appears stated age Attitude/Demeanor/Rapport: Engaged Affect (typically observed): Accepting Orientation: : Oriented to Self, Oriented to Place, Oriented to  Time, Oriented to Situation Alcohol / Substance Use: Not Applicable Psych Involvement: No (comment)  Admission diagnosis:  AKI (acute kidney injury) ([place]) [N17.9] Nausea vomiting and diarrhea [R11.2, R19.7] Patient Active Problem List  Diagnosis Date Noted  . Nausea vomiting and diarrhea 06/14/2019  . AKI (acute kidney injury) ([place]) 06/14/2019  . CAP (community acquired pneumonia) 02/13/2018  . Dizziness 02/12/2018  . Autoimmune hepatitis ([place]) 11/18/2017  . Essential  hypertension 11/18/2017  . Class 3 severe obesity due to excess calories with serious comorbidity and body mass index (BMI) of 40.0 to 44.9 in adult ([facility]) 11/18/2017  . History of depression 11/18/2017  . Gastroesophageal reflux disease without esophagitis 11/18/2017  . Dyslipidemia 11/18/2017  . Vitamin D deficiency 11/18/2017  . History of migraine 11/18/2017  . History of meningioma 11/18/2017  . DDD (degenerative disc disease), cervical 11/18/2017  . DDD (degenerative disc disease), lumbar 11/18/2017  . Acute diverticulitis 06/07/2017  . Hypokalemia 06/07/2017  . Hypomagnesemia 06/07/2017  . Tobacco use disorder 06/07/2017  . Atelectasis of both lungs 06/07/2017  . Acute maxillary sinusitis 10/13/2016  . Abnormal LFTs   . Pain in the chest   . Diabetes mellitus with complication ([place])   . Hypoxia 12/29/2015  . Uncontrolled type 2 diabetes mellitus ([place]) 12/29/2015  . Dyspnea 12/29/2015  . LLQ pain 12/29/2015  . Lupus ([place]) 12/29/2015  . Chest pain 12/29/2015  . Transaminitis 12/29/2015  . Chronic pain 12/29/2015  . Depression 12/29/2015   PCP:  Ferd Hibbs, NP Pharmacy:   [facility] [place] [address] Phone: [phone] Fax: [phone]     Social Determinants of Health (SDOH) Interventions    Readmission Risk Interventions No flowsheet data found.

## 2019-06-15 NOTE — Care Management Obs Status (Signed)
[facility] NOTIFICATION   Patient Details  Name: Monica Ellis MRN: [id number] Date of Birth: 08/23/1961   Medicare Observation Status Notification Given:  Yes    Marilu Favre, RN 06/15/2019, 11:22 AM

## 2019-06-15 NOTE — Progress Notes (Signed)
Physical Therapy Treatment Patient Details Name: Monica Ellis MRN: [id number] DOB: 1961/08/28 Today's Date: 06/15/2019    History of Present Illness Pt is a 58 y.o. F with significant PMH of SLE, DM2, HTN, and autoimmune hepatitis. Pt recently taken off Cellcept (causing progressive generalized weakness) and switched to Imuran for auto immune hepatitis. Since first dose of medication, pt presents with nausea, vomiting and diarrhea and subsequent acute kidney injury.    PT Comments    Pt presents in L sidelying with complaints of HA on arrival.  Pt required max cues for encouragement to participate in stair training.  Pt is mildly unsetady and continues to benefit from RW for home use.  Progressed patient to stair training with close supervision for safety.  Pt will f/u with HHPT to continue to improve strength and mobility.  Pt inquired about getting a CNA for home.  Informed case management of request.  Pt left in bed resting.     Follow Up Recommendations  Home health PT;Supervision for mobility/OOB     Equipment Recommendations  Rolling walker with 5" wheels    Recommendations for Other Services       Precautions / Restrictions Precautions Precautions: Fall Restrictions Weight Bearing Restrictions: No    Mobility  Bed Mobility Overal bed mobility: Needs Assistance Bed Mobility: Supine to Sit;Sit to Supine     Supine to sit: Supervision Sit to supine: Supervision   General bed mobility comments: No assistance needed to mobilize into and OOB.  pt did utilized railings.  Transfers Overall transfer level: Needs assistance Equipment used: Rolling walker (2 wheeled) Transfers: Sit to/from Stand Sit to Stand: Supervision         General transfer comment: Cues for hand placement  Ambulation/Gait Ambulation/Gait assistance: Supervision Gait Distance (Feet): 12 Feet(6 ft x2.  Performed stair training in between 6 ft trials.) Assistive device: None(did hold to IV  pole and reach out for chair railings.) Gait Pattern/deviations: Step-through pattern;Decreased stride length;Decreased dorsiflexion - right;Decreased dorsiflexion - left;Shuffle Gait velocity: decreased   General Gait Details: Pt continues to fatigue quickly and refused to progress gt due to fatigue and head ache.  She continues to benefit from AD to improve safety and decreased "furniture cruising"   Stairs Stairs: Yes Stairs assistance: Supervision Stair Management: One rail Left;Forwards;Backwards;Step to pattern Number of Stairs: 5 General stair comments: Cues for sequencing and hand placement on railing.  Pt performed forward to ascend and backwards to descend.   Wheelchair Mobility    Modified Rankin (Stroke Patients Only)       Balance     Sitting balance-Leahy Scale: Good       Standing balance-Leahy Scale: Fair                              Cognition Arousal/Alertness: Awake/alert Behavior During Therapy: WFL for tasks assessed/performed Overall Cognitive Status: Within Functional Limits for tasks assessed                                        Exercises      General Comments        Pertinent Vitals/Pain Pain Assessment: 0-10 Pain Score: 8  Pain Location: head Pain Descriptors / Indicators: Aching Pain Intervention(s): Monitored during session;Limited activity within patient's tolerance    Home Living  Prior Function            PT Goals (current goals can now be found in the care plan section) Acute Rehab PT Goals Patient Stated Goal: "To get an aide for home use." Potential to Achieve Goals: Good Progress towards PT goals: Progressing toward goals    Frequency    Min 3X/week      PT Plan Current plan remains appropriate    Co-evaluation              AM-PAC PT "6 Clicks" Mobility   Outcome Measure  Help needed turning from your back to your side while in a flat bed  without using bedrails?: None Help needed moving from lying on your back to sitting on the side of a flat bed without using bedrails?: A Little Help needed moving to and from a bed to a chair (including a wheelchair)?: A Little Help needed standing up from a chair using your arms (e.g., wheelchair or bedside chair)?: A Little Help needed to walk in hospital room?: A Little Help needed climbing 3-5 steps with a railing? : A Little 6 Click Score: 19    End of Session Equipment Utilized During Treatment: Gait belt Activity Tolerance: Patient tolerated treatment well Patient left: in bed;with call bell/phone within reach Nurse Communication: Mobility status PT Visit Diagnosis: Unsteadiness on feet (R26.81);Muscle weakness (generalized) (M62.81);Difficulty in walking, not elsewhere classified (R26.2)     Time: 3729-0211 PT Time Calculation (min) (ACUTE ONLY): 18 min  Charges:  $Gait Training: 8-22 mins                     Governor Rooks, PTA Acute Rehabilitation Services Pager [phone] Office [phone]     Robertlee Rogacki Eli Hose 06/15/2019, 12:22 PM

## 2019-06-16 LAB — URINE CULTURE: Culture: >=100000 — AB

## 2019-06-28 ENCOUNTER — Other Ambulatory Visit: Payer: Self-pay

## 2019-06-28 ENCOUNTER — Ambulatory Visit
Admission: RE | Admit: 2019-06-28 | Discharge: 2019-06-28 | Disposition: A | Discharge status: Home / Self Care | Payer: Medicare Other | Source: Ambulatory Visit | Attending: Nurse Practitioner | Admitting: Nurse Practitioner

## 2019-06-28 DIAGNOSIS — K754 Autoimmune hepatitis: Secondary | ICD-10-CM

## 2019-08-03 ENCOUNTER — Other Ambulatory Visit: Payer: Self-pay | Admitting: Nurse Practitioner

## 2019-08-03 ENCOUNTER — Ambulatory Visit
Admission: RE | Admit: 2019-08-03 | Discharge: 2019-08-03 | Disposition: A | Discharge status: Home / Self Care | Payer: Medicare Other | Source: Ambulatory Visit | Attending: Nurse Practitioner | Admitting: Nurse Practitioner

## 2019-08-03 DIAGNOSIS — R05 Cough: Secondary | ICD-10-CM

## 2019-08-03 DIAGNOSIS — R059 Cough, unspecified: Secondary | ICD-10-CM

## 2019-08-03 DIAGNOSIS — U071 COVID-19: Secondary | ICD-10-CM

## 2019-08-20 ENCOUNTER — Other Ambulatory Visit: Payer: Self-pay | Admitting: Family Medicine

## 2019-08-20 ENCOUNTER — Other Ambulatory Visit: Payer: Self-pay | Admitting: Nurse Practitioner

## 2019-08-20 DIAGNOSIS — R7989 Other specified abnormal findings of blood chemistry: Secondary | ICD-10-CM

## 2019-08-20 DIAGNOSIS — R791 Abnormal coagulation profile: Secondary | ICD-10-CM

## 2019-08-20 DIAGNOSIS — R071 Chest pain on breathing: Secondary | ICD-10-CM

## 2019-08-22 ENCOUNTER — Emergency Department (HOSPITAL_COMMUNITY)
Admit: 2019-08-22 | Discharge: 2019-08-22 | Disposition: A | Discharge status: Home / Self Care | Payer: Medicare Other | Attending: Nurse Practitioner | Admitting: Nurse Practitioner

## 2019-08-22 ENCOUNTER — Other Ambulatory Visit (HOSPITAL_COMMUNITY): Payer: Self-pay | Admitting: Nurse Practitioner

## 2019-08-22 ENCOUNTER — Emergency Department (HOSPITAL_COMMUNITY): Payer: Medicare Other

## 2019-08-22 ENCOUNTER — Other Ambulatory Visit: Payer: Medicare Other

## 2019-08-22 ENCOUNTER — Emergency Department (HOSPITAL_BASED_OUTPATIENT_CLINIC_OR_DEPARTMENT_OTHER): Payer: Medicare Other

## 2019-08-22 ENCOUNTER — Other Ambulatory Visit: Payer: Self-pay | Admitting: Nurse Practitioner

## 2019-08-22 ENCOUNTER — Ambulatory Visit (HOSPITAL_COMMUNITY): Payer: Medicare Other

## 2019-08-22 ENCOUNTER — Encounter (HOSPITAL_COMMUNITY): Payer: Self-pay

## 2019-08-22 ENCOUNTER — Telehealth: Payer: Self-pay

## 2019-08-22 ENCOUNTER — Emergency Department (HOSPITAL_COMMUNITY)
Admission: EM | Admit: 2019-08-22 | Discharge: 2019-08-23 | Disposition: A | Discharge status: Home / Self Care | Payer: Medicare Other | Attending: Emergency Medicine | Admitting: Emergency Medicine

## 2019-08-22 ENCOUNTER — Other Ambulatory Visit: Payer: Self-pay

## 2019-08-22 DIAGNOSIS — E119 Type 2 diabetes mellitus without complications: Secondary | ICD-10-CM | POA: Diagnosis not present

## 2019-08-22 DIAGNOSIS — R7989 Other specified abnormal findings of blood chemistry: Secondary | ICD-10-CM

## 2019-08-22 DIAGNOSIS — Z20828 Contact with and (suspected) exposure to other viral communicable diseases: Secondary | ICD-10-CM | POA: Diagnosis not present

## 2019-08-22 DIAGNOSIS — M7989 Other specified soft tissue disorders: Secondary | ICD-10-CM | POA: Diagnosis not present

## 2019-08-22 DIAGNOSIS — M79661 Pain in right lower leg: Secondary | ICD-10-CM | POA: Diagnosis not present

## 2019-08-22 DIAGNOSIS — I10 Essential (primary) hypertension: Secondary | ICD-10-CM | POA: Insufficient documentation

## 2019-08-22 DIAGNOSIS — F17210 Nicotine dependence, cigarettes, uncomplicated: Secondary | ICD-10-CM | POA: Insufficient documentation

## 2019-08-22 DIAGNOSIS — R609 Edema, unspecified: Secondary | ICD-10-CM

## 2019-08-22 DIAGNOSIS — Z794 Long term (current) use of insulin: Secondary | ICD-10-CM | POA: Diagnosis not present

## 2019-08-22 DIAGNOSIS — J069 Acute upper respiratory infection, unspecified: Secondary | ICD-10-CM | POA: Insufficient documentation

## 2019-08-22 DIAGNOSIS — R2241 Localized swelling, mass and lump, right lower limb: Secondary | ICD-10-CM | POA: Insufficient documentation

## 2019-08-22 DIAGNOSIS — Z79899 Other long term (current) drug therapy: Secondary | ICD-10-CM | POA: Insufficient documentation

## 2019-08-22 DIAGNOSIS — R0789 Other chest pain: Secondary | ICD-10-CM | POA: Diagnosis present

## 2019-08-22 DIAGNOSIS — R0781 Pleurodynia: Secondary | ICD-10-CM

## 2019-08-22 LAB — CBC WITH DIFFERENTIAL/PLATELET
Abs Immature Granulocytes: 0.15 10*3/uL — ABNORMAL HIGH (ref 0.00–0.07)
Basophils Absolute: 0 10*3/uL (ref 0.0–0.1)
Basophils Relative: 0 %
Eosinophils Absolute: 0 10*3/uL (ref 0.0–0.5)
Eosinophils Relative: 0 %
HCT: 47.2 % — ABNORMAL HIGH (ref 36.0–46.0)
Hemoglobin: 15.8 g/dL — ABNORMAL HIGH (ref 12.0–15.0)
Immature Granulocytes: 1 %
Lymphocytes Relative: 23 %
Lymphs Abs: 3.1 10*3/uL (ref 0.7–4.0)
MCH: 33.1 pg (ref 26.0–34.0)
MCHC: 33.5 g/dL (ref 30.0–36.0)
MCV: 99 fL (ref 80.0–100.0)
Monocytes Absolute: 0.8 10*3/uL (ref 0.1–1.0)
Monocytes Relative: 6 %
Neutro Abs: 9.5 10*3/uL — ABNORMAL HIGH (ref 1.7–7.7)
Neutrophils Relative %: 70 %
Platelets: 214 10*3/uL (ref 150–400)
RBC: 4.77 MIL/uL (ref 3.87–5.11)
RDW: 15.2 % (ref 11.5–15.5)
WBC: 13.6 10*3/uL — ABNORMAL HIGH (ref 4.0–10.5)
nRBC: 0.3 % — ABNORMAL HIGH (ref 0.0–0.2)

## 2019-08-22 LAB — COMPREHENSIVE METABOLIC PANEL
ALT: 66 U/L — ABNORMAL HIGH (ref 0–44)
AST: 55 U/L — ABNORMAL HIGH (ref 15–41)
Albumin: 3.5 g/dL (ref 3.5–5.0)
Alkaline Phosphatase: 42 U/L (ref 38–126)
Anion gap: 9 (ref 5–15)
BUN: 16 mg/dL (ref 6–20)
CO2: 29 mmol/L (ref 22–32)
Calcium: 9.4 mg/dL (ref 8.9–10.3)
Chloride: 100 mmol/L (ref 98–111)
Creatinine, Ser: 0.9 mg/dL (ref 0.44–1.00)
GFR calc Af Amer: >60 mL/min (ref >60)
GFR calc non Af Amer: >60 mL/min (ref >60)
Glucose, Bld: 67 mg/dL — ABNORMAL LOW (ref 70–99)
Potassium: 4.2 mmol/L (ref 3.5–5.1)
Sodium: 138 mmol/L (ref 135–145)
Total Bilirubin: 1.3 mg/dL — ABNORMAL HIGH (ref 0.3–1.2)
Total Protein: 8.6 g/dL — ABNORMAL HIGH (ref 6.5–8.1)

## 2019-08-22 LAB — SARS CORONAVIRUS 2 BY RT PCR (HOSPITAL ORDER, PERFORMED IN ~~LOC~~ HOSPITAL LAB): SARS Coronavirus 2: NEGATIVE

## 2019-08-22 LAB — CBG MONITORING, ED
Glucose-Capillary: 111 mg/dL — ABNORMAL HIGH (ref 70–99)
Glucose-Capillary: 43 mg/dL — CL (ref 70–99)

## 2019-08-22 MED ORDER — DEXTROSE 50 % IV SOLN
INTRAVENOUS | Status: AC
  Filled 2019-08-22: qty 50

## 2019-08-22 MED ORDER — DOXYCYCLINE HYCLATE 100 MG PO TABS
100.0000 mg | ORAL_TABLET | Freq: Once | ORAL | Status: AC
  Administered 2019-08-22: 100 mg via ORAL
  Filled 2019-08-22: qty 1

## 2019-08-22 MED ORDER — TECHNETIUM TO 99M ALBUMIN AGGREGATED
1.5000 | Freq: Once | INTRAVENOUS | Status: AC | PRN
  Administered 2019-08-22: 23:00:00 1.5 via INTRAVENOUS

## 2019-08-22 MED ORDER — HYDROCODONE-ACETAMINOPHEN 5-325 MG PO TABS
1.0000 | ORAL_TABLET | Freq: Once | ORAL | Status: AC
  Administered 2019-08-22: 1 via ORAL
  Filled 2019-08-22: qty 1

## 2019-08-22 MED ORDER — DEXTROSE 50 % IV SOLN: 12.5000 g | Freq: Once | INTRAVENOUS | Status: DC

## 2019-08-22 MED ORDER — DEXTROSE 50 % IV SOLN
INTRAVENOUS | Status: AC
  Administered 2019-08-22: 20:00:00 12.5 g via INTRAVENOUS
  Filled 2019-08-22: qty 50

## 2019-08-22 MED ORDER — HYDROCODONE-ACETAMINOPHEN 5-325 MG PO TABS: 2.0000 | ORAL_TABLET | ORAL | 0 refills | Status: DC | PRN

## 2019-08-22 MED ORDER — DEXTROSE 50 % IV SOLN
12.5000 g | Freq: Once | INTRAVENOUS | Status: AC
  Administered 2019-08-22: 20:00:00 12.5 g via INTRAVENOUS

## 2019-08-22 MED ORDER — DOXYCYCLINE HYCLATE 100 MG PO CAPS: 100.0000 mg | ORAL_CAPSULE | Freq: Two times a day (BID) | ORAL | 0 refills | Status: DC

## 2019-08-22 NOTE — ED Triage Notes (Signed)
Per [facility], Pt released from quarantine for COVID on Aug. 30. Pt reports for the last 3 days having  worsening shortness of breath, dry cough and R side CP. Pt reports pain is worse with movement. Seen by PCP on Friday for follow-up, had elevated D-Dimer, concern for PE.

## 2019-08-22 NOTE — ED Notes (Addendum)
CBG of 57 at 23:08, RN Garlon Hatchet informed

## 2019-08-22 NOTE — Telephone Encounter (Signed)
13-hour prep called to [facility] (pt's pharmacy listed in epic).  Prednisone 50 mg PO 9/17020 @ 0100, 0700 and 1300.  Benadryl 50 mg PO 9/17 @ 1300.  Patient notified this has been called in.  Brita Romp, RN

## 2019-08-22 NOTE — ED Notes (Signed)
Pt c/o feeling like she had low blood sugar. CBG: 48. MD made aware and ordered 12.5 g dextrose plus food. Administered dextrose, cup of orange juice and 2 packets of gram crackers.

## 2019-08-22 NOTE — ED Notes (Signed)
Nuclear med tech returned call, Stated she would arrive for VQ scan in a little over an hour

## 2019-08-22 NOTE — Progress Notes (Signed)
Lower extremity venous has been completed.   Preliminary results in CV Proc.   Abram Sander 08/22/2019 6:06 PM

## 2019-08-22 NOTE — ED Notes (Signed)
Pt verbalized understanding of discharge instructions, scripts and s/s that would require return to ED.

## 2019-08-22 NOTE — ED Notes (Signed)
Pt taken to VQ scan.

## 2019-08-22 NOTE — ED Provider Notes (Signed)
[facility] EMERGENCY DEPARTMENT Provider Note   CSN: [id number] Arrival date & time: 08/22/19  1724     History   Chief Complaint Chief Complaint  Patient presents with  . Chest Pain  . Shortness of Breath    HPI Monica Ellis is a 58 y.o. female.     HPI Patient presents with right-sided chest pain which is worse with coughing and deep breathing, shortness of breath, cough which is nonproductive, subjective fevers and chills and right leg swelling.  Symptoms started 3 days ago.  Was seen by her PCP and had lab test performed.  Had elevated d-dimer and was referred to the emergency department for further work-up for possible PE.  Patient was diagnosed with COVID-19 in August. Past Medical History:  Diagnosis Date  . Autoimmune hepatitis ([place])   . Diabetes mellitus without complication ([place])   . GERD (gastroesophageal reflux disease)   . Hypertension   . Lupus ([place])   . Morbid obesity ([place])   . Pneumonia   . Shortness of breath dyspnea   . Tobacco use disorder     Patient Active Problem List   Diagnosis Date Noted  . Nausea vomiting and diarrhea 06/14/2019  . AKI (acute kidney injury) ([place]) 06/14/2019  . CAP (community acquired pneumonia) 02/13/2018  . Dizziness 02/12/2018  . Autoimmune hepatitis ([place]) 11/18/2017  . Essential hypertension 11/18/2017  . Class 3 severe obesity due to excess calories with serious comorbidity and body mass index (BMI) of 40.0 to 44.9 in adult ([place]) 11/18/2017  . History of depression 11/18/2017  . Gastroesophageal reflux disease without esophagitis 11/18/2017  . Dyslipidemia 11/18/2017  . Vitamin D deficiency 11/18/2017  . History of migraine 11/18/2017  . History of meningioma 11/18/2017  . DDD (degenerative disc disease), cervical 11/18/2017  . DDD (degenerative disc disease), lumbar 11/18/2017  . Acute diverticulitis 06/07/2017  . Hypokalemia 06/07/2017  . Hypomagnesemia 06/07/2017  . Tobacco use  disorder 06/07/2017  . Atelectasis of both lungs 06/07/2017  . Acute maxillary sinusitis 10/13/2016  . Abnormal LFTs   . Pain in the chest   . Diabetes mellitus with complication ([place])   . Hypoxia 12/29/2015  . Uncontrolled type 2 diabetes mellitus ([place]) 12/29/2015  . Dyspnea 12/29/2015  . LLQ pain 12/29/2015  . Lupus ([place]) 12/29/2015  . Chest pain 12/29/2015  . Transaminitis 12/29/2015  . Chronic pain 12/29/2015  . Depression 12/29/2015    Past Surgical History:  Procedure Laterality Date  . CHOLECYSTECTOMY    . DILATION AND CURETTAGE OF UTERUS    . EYE SURGERY Bilateral    laser surgery   . IR TRANSCATHETER BX  11/03/2018  . IR US GUIDE VASC ACCESS RIGHT  11/03/2018  . IR VENOGRAM HEPATIC W HEMODYNAMIC EVALUATION  11/03/2018     OB History   No obstetric history on file.      Home Medications    Prior to Admission medications   Medication Sig Start Date End Date Taking? Authorizing Provider  amLODipine (NORVASC) 5 MG tablet Take 5 mg by mouth every evening. 06/08/19  Yes [provider]  Cholecalciferol (VITAMIN D PO) Take 1 tablet by mouth every evening.    Yes [provider]  DULoxetine (CYMBALTA) 60 MG capsule Take 60 mg by mouth at bedtime.  08/26/16  Yes [provider]  fluticasone (FLONASE) 50 MCG/ACT nasal spray Place 1 spray into both nostrils daily. 02/11/17  Yes Caudill, Gwynneth Aliment, MD  insulin lispro protamine-lispro (HUMALOG 75/25 MIX) (  75-25) 100 UNIT/ML SUSP injection Inject 90 Units into the skin 3 (three) times daily with meals.   Yes [provider]  LYRICA 100 MG capsule Take 100 mg by mouth at bedtime. 01/23/18  Yes [provider]  magnesium 30 MG tablet Take 30 mg by mouth at bedtime.    Yes [provider]  Multiple Vitamin (MULTIVITAMIN) tablet Take 1 tablet by mouth daily.    Yes [provider]  nadolol (CORGARD) 40 MG tablet Take 40 mg by mouth every evening.  09/16/16  Yes  [provider]  Omega-3 Fatty Acids (FISH OIL PO) Take 1 capsule by mouth every evening.    Yes [provider]  omeprazole (PRILOSEC) 40 MG capsule Take 40 mg by mouth every evening.  06/03/17  Yes [provider]  Oxycodone HCl 10 MG TABS Take 10 mg by mouth every 6 (six) hours as needed (pain).   Yes [provider]  predniSONE (DELTASONE) 50 MG tablet Take 50 mg by mouth daily with breakfast.   Yes [provider]  TURMERIC PO Take 400 mg by mouth daily.    Yes [provider]  zolpidem (AMBIEN) 10 MG tablet Take 10 mg by mouth at bedtime. 05/15/19  Yes [provider]  doxycycline (VIBRAMYCIN) 100 MG capsule Take 1 capsule (100 mg total) by mouth 2 (two) times daily. One po bid x 7 days 08/23/19   Julianne Rice, MD  HYDROcodone-acetaminophen [facility]) 5-325 MG tablet Take 2 tablets by mouth every 4 (four) hours as needed. 08/22/19   Julianne Rice, MD    Family History Family History  Problem Relation Age of Onset  . Diabetes Father   . Hypertension Father   . Hypertension Mother     Social History Social History   Tobacco Use  . Smoking status: Current Every Day Smoker    Packs/day: 1.00    Years: 30.00    Pack years: 30.00    Types: Cigarettes  . Smokeless tobacco: Never Used  Substance Use Topics  . Alcohol use: No  . Drug use: No     Allergies   Ace inhibitors, Imuran [azathioprine], Lisinopril, Tramadol, Iodinated diagnostic agents, Iodine, Janumet [sitagliptin-metformin hcl], and Victoza [liraglutide]   Review of Systems Review of Systems  Constitutional: Positive for chills and fever.  HENT: Negative for sore throat and trouble swallowing.   Eyes: Negative for visual disturbance.  Respiratory: Positive for cough and shortness of breath. Negative for wheezing.   Cardiovascular: Positive for chest pain and leg swelling. Negative for palpitations.  Gastrointestinal: Negative for abdominal pain,  diarrhea, nausea and vomiting.  Musculoskeletal: Negative for back pain, neck pain and neck stiffness.  Skin: Negative for rash and wound.  Neurological: Negative for dizziness, weakness, light-headedness, numbness and headaches.  All other systems reviewed and are negative.    Physical Exam Updated Vital Signs BP 140/70   Pulse 69   Temp 97.6 F (36.4 C)   Resp 19   SpO2 96%   Physical Exam Vitals signs and nursing note reviewed.  Constitutional:      General: She is not in acute distress.    Appearance: Normal appearance. She is well-developed. She is not ill-appearing.  HENT:     Head: Normocephalic and atraumatic.     Nose: Nose normal.     Mouth/Throat:     Mouth: Mucous membranes are moist.  Eyes:     Pupils: Pupils are equal, round, and reactive to light.  Neck:  Musculoskeletal: Normal range of motion and neck supple. No neck rigidity or muscular tenderness.  Cardiovascular:     Rate and Rhythm: Normal rate and regular rhythm.     Heart sounds: No murmur. No friction rub. No gallop.   Pulmonary:     Effort: Pulmonary effort is normal.     Breath sounds: Rales present.     Comments: Few rales in bilateral bases.  No respiratory distress. Abdominal:     General: Bowel sounds are normal. There is no distension.     Palpations: Abdomen is soft. There is no mass.     Tenderness: There is no abdominal tenderness. There is no right CVA tenderness, left CVA tenderness, guarding or rebound.     Hernia: No hernia is present.  Musculoskeletal: Normal range of motion.        General: Tenderness present. No swelling, deformity or signs of injury.     Right lower leg: No edema.     Left lower leg: No edema.     Comments: Mild right calf tenderness.  Distal pulses are 2+.  No obvious asymmetry.  Lymphadenopathy:     Cervical: No cervical adenopathy.  Skin:    General: Skin is warm and dry.     Findings: No erythema or rash.  Neurological:     General: No focal  deficit present.     Mental Status: She is alert and oriented to person, place, and time.  Psychiatric:        Behavior: Behavior normal.      ED Treatments / Results  Labs (all labs ordered are listed, but only abnormal results are displayed) Labs Reviewed  CBC WITH DIFFERENTIAL/PLATELET - Abnormal; Notable for the following components:      Result Value   WBC 13.6 (*)    Hemoglobin 15.8 (*)    HCT 47.2 (*)    nRBC 0.3 (*)    Neutro Abs 9.5 (*)    Abs Immature Granulocytes 0.15 (*)    All other components within normal limits  COMPREHENSIVE METABOLIC PANEL - Abnormal; Notable for the following components:   Glucose, Bld 67 (*)    Total Protein 8.6 (*)    AST 55 (*)    ALT 66 (*)    Total Bilirubin 1.3 (*)    All other components within normal limits  CBG MONITORING, ED - Abnormal; Notable for the following components:   Glucose-Capillary 43 (*)    All other components within normal limits  CBG MONITORING, ED - Abnormal; Notable for the following components:   Glucose-Capillary 111 (*)    All other components within normal limits  SARS CORONAVIRUS 2 (HOSPITAL ORDER, [facility])    EKG None  Radiology Dg Chest 2 View  Result Date: 08/22/2019 CLINICAL DATA:  Elevated D-dimer evaluate for PE EXAM: CHEST - 2 VIEW COMPARISON:  08/22/2019 FINDINGS: The heart size and mediastinal contours are within normal limits. Mild aortic atherosclerosis. Both lungs are clear. The visualized skeletal structures are unremarkable. IMPRESSION: No active cardiopulmonary disease. Electronically Signed   By: Donavan Foil M.D.   On: 08/22/2019 23:18   Nm Pulmonary Perfusion  Result Date: 08/22/2019 CLINICAL DATA:  Positive D-dimer EXAM: NUCLEAR MEDICINE PERFUSION LUNG SCAN TECHNIQUE: Perfusion images were obtained in multiple projections after intravenous injection of radiopharmaceutical. Ventilation scans intentionally deferred if perfusion scan and chest x-ray  adequate for interpretation during COVID 19 epidemic. RADIOPHARMACEUTICALS:  1.5 mCi Tc-71mMAA IV COMPARISON:  Chest x-ray  08/22/2019, V/Q scan 12/29/2015 FINDINGS: Homogeneous perfusion to the lungs. No wedge-shaped or peripheral defects are identified. IMPRESSION: Negative.  No significant perfusion abnormalities. Electronically Signed   By: Donavan Foil M.D.   On: 08/22/2019 23:17   Dg Chest Port 1 View  Result Date: 08/22/2019 CLINICAL DATA:  Pt c/o generalized chest pain x 1 day. Patient states she was recently diagnosed with "double pna." Hx of PNA, HTN, GERD, AND DM. Pt is a current smoker. Patient wore a mask. Tech wore N95, contact gown, and face shield.chest pain EXAM: PORTABLE CHEST 1 VIEW COMPARISON:  08/03/2019 FINDINGS: Heart is normal in size. The lungs are free of focal consolidations and pleural effusions. No pulmonary edema. IMPRESSION: No active disease. Electronically Signed   By: Nolon Nations M.D.   On: 08/22/2019 18:54   Vas [place] Lower Extremity Venous (dvt) (only [facility])  Result Date: 08/22/2019  Lower Venous Study Indications: Swelling, and Edema.  Limitations: Poor ultrasound/tissue interface and body habitus. Comparison Study: no prior Performing Technologist: Abram Sander RVS  Examination Guidelines: A complete evaluation includes B-mode imaging, spectral Doppler, color Doppler, and power Doppler as needed of all accessible portions of each vessel. Bilateral testing is considered an integral part of a complete examination. Limited examinations for reoccurring indications may be performed as noted.  +---------+---------------+---------+-----------+----------+------------------+ RIGHT    CompressibilityPhasicitySpontaneityPropertiesThrombus Aging     +---------+---------------+---------+-----------+----------+------------------+ CFV      Full           Yes      Yes                                      +---------+---------------+---------+-----------+----------+------------------+ SFJ      Full                                                            +---------+---------------+---------+-----------+----------+------------------+ FV Prox  Full                                         limited                                                                  visualization      +---------+---------------+---------+-----------+----------+------------------+ FV Mid                                                Not visualized     +---------+---------------+---------+-----------+----------+------------------+ FV Distal                                             Not visualized     +---------+---------------+---------+-----------+----------+------------------+ PFV  Full                                                            +---------+---------------+---------+-----------+----------+------------------+ POP      Full           Yes      Yes                                     +---------+---------------+---------+-----------+----------+------------------+ PTV      Full                                                            +---------+---------------+---------+-----------+----------+------------------+ PERO                                                  Not visualized     +---------+---------------+---------+-----------+----------+------------------+     Summary: Right: There is no evidence of deep vein thrombosis in the lower extremity. However, portions of this examination were limited- see technologist comments above. No cystic structure found in the popliteal fossa.  *See table(s) above for measurements and observations.    Preliminary     Procedures Procedures (including critical care time)  Medications Ordered in ED Medications  dextrose 50 % solution 12.5 g (12.5 g Intravenous Given 08/22/19 2001)  technetium albumin aggregated (MAA)  injection solution 1.5 millicurie (1.5 millicuries Intravenous Contrast Given 08/22/19 2241)  doxycycline (VIBRA-TABS) tablet 100 mg (100 mg Oral Given 08/22/19 2333)  HYDROcodone-acetaminophen (NORCO/VICODIN) 5-325 MG per tablet 1 tablet (1 tablet Oral Given 08/22/19 2333)     Initial Impression / Assessment and Plan / ED Course  I have reviewed the triage vital signs and the nursing notes.  Pertinent labs & imaging results that were available during my care of the patient were reviewed by me and considered in my medical decision making (see chart for details).        Patient has allergy to IV contrast.  Will test for COVID-19 and get DVT study of the right leg.  Ultrasound right leg without evidence of DVT.  Chest x-ray without acute findings.  Patient with negative VQ scan.  Vital signs are stable.  COVID testing is negative.  Will treat for likely early pneumonia versus URI.   Chest pain is pleuritic in nature.  Low suspicion for CAD.  Likely related to cough or pleurisy.  Return precautions given. Final Clinical Impressions(s) / ED Diagnoses   Final diagnoses:  Upper respiratory tract infection, unspecified type  Pleuritic chest pain    ED Discharge Orders         Ordered    doxycycline (VIBRAMYCIN) 100 MG capsule  2 times daily     08/22/19 2327    HYDROcodone-acetaminophen (NORCO) 5-325 MG tablet  Every 4 hours PRN     08/22/19 2327           Julianne Rice, MD 08/23/19 1244

## 2019-08-23 ENCOUNTER — Inpatient Hospital Stay: Admission: RE | Admit: 2019-08-23 | Payer: Medicare Other | Source: Ambulatory Visit

## 2019-08-23 ENCOUNTER — Ambulatory Visit (HOSPITAL_COMMUNITY): Payer: Medicare Other

## 2019-08-23 LAB — GLUCOSE, CAPILLARY
Glucose-Capillary: 114 mg/dL — ABNORMAL HIGH (ref 70–99)
Glucose-Capillary: 57 mg/dL — ABNORMAL LOW (ref 70–99)
Glucose-Capillary: 61 mg/dL — ABNORMAL LOW (ref 70–99)

## 2019-08-23 NOTE — ED Notes (Signed)
Pt CBG at 2345 61. ashe ate some more food and juice. Refused d50. CBG now 114

## 2019-09-19 ENCOUNTER — Ambulatory Visit: Payer: Medicare Other | Admitting: Diagnostic Neuroimaging

## 2019-10-15 ENCOUNTER — Encounter: Payer: Self-pay | Admitting: *Deleted

## 2019-10-16 ENCOUNTER — Other Ambulatory Visit: Payer: Self-pay

## 2019-10-16 ENCOUNTER — Encounter: Payer: Self-pay | Admitting: Diagnostic Neuroimaging

## 2019-10-16 ENCOUNTER — Ambulatory Visit: Payer: Medicare Other | Admitting: Diagnostic Neuroimaging

## 2019-10-16 VITALS — BP 166/97 | HR 90 | Temp 97.4°F | Ht 66.0 in | Wt 330.0 lb

## 2019-10-16 DIAGNOSIS — D329 Benign neoplasm of meninges, unspecified: Secondary | ICD-10-CM | POA: Diagnosis not present

## 2019-10-16 DIAGNOSIS — R413 Other amnesia: Secondary | ICD-10-CM

## 2019-10-16 DIAGNOSIS — R29810 Facial weakness: Secondary | ICD-10-CM | POA: Diagnosis not present

## 2019-10-16 NOTE — Progress Notes (Signed)
[facility]  PATIENT: Monica Ellis DOB: [birth year]  REFERRING CLINICIAN: Blevins, A  HISTORY FROM: patient  REASON FOR VISIT: new consult    HISTORICAL  CHIEF COMPLAINT:  Chief Complaint  Patient presents with  . Hx frontal lobe tumor, facial weakness    rm 7, New Pt, "I think I may have had a stroke"    HISTORY OF PRESENT ILLNESS:   58 year old female with history of lupus, autoimmune hepatitis, hypertension, diabetes, depression, here for evaluation of left facial numbness and weakness and meningioma follow-up.  Patient previously had headaches, chronic migraine, had MRI of the brain demonstrating 2 small incidental meningiomas.  Patient also had recent hospitalization for reaction to azathioprine in July 2020.  Patient took first dose of medication and developed severe nausea and vomiting.  Now patient back on CellCept and prednisone. Around that time she also noticed some left lower facial weakness and abnormal sensation.  Symptoms have been stable since that time.  At age 34 years old patient had some type of weakness or paralysis of one side of her face which she describes as a "palsy" which subsequently resolved.   REVIEW OF SYSTEMS: Full 14 system review of systems performed and negative with exception of: Insomnia snoring memory loss weakness slurred speech dizziness depression joint pain aching muscles swelling in legs hearing loss fatigue.   ALLERGIES: Allergies  Allergen Reactions  . Ace Inhibitors Swelling  . Imuran [Azathioprine] Nausea And Vomiting    Severe N/V/D and AKI after starting the med.  . Lisinopril Swelling and Other (See Comments)    Other reaction(s): Angioedema (ALLERGY/intolerance), Face  . Tramadol Other (See Comments)    Other reaction(s): Mental Status Changes (intolerance)  . Iodinated Diagnostic Agents Swelling    When patient was in her 20's, swelled up all over after getting getting injection of contrast;  did not have any other symptoms/ was given benadryl after that happened and had no further problems per patients/  . Iodine Swelling  . Janumet [Sitagliptin-Metformin Hcl] Other (See Comments)    Continuous yeast infection  . Victoza [Liraglutide] Nausea And Vomiting    HOME MEDICATIONS: Outpatient Medications Prior to Visit  Medication Sig Dispense Refill  . amLODipine (NORVASC) 5 MG tablet Take 5 mg by mouth every evening.    . Cholecalciferol (VITAMIN D PO) Take 1 tablet by mouth every evening.     . DULoxetine (CYMBALTA) 60 MG capsule Take 60 mg by mouth at bedtime.     Marland Kitchen HYDROcodone-acetaminophen (NORCO) 5-325 MG tablet Take 2 tablets by mouth every 4 (four) hours as needed. 10 tablet 0  . insulin lispro protamine-lispro (HUMALOG 75/25 MIX) (75-25) 100 UNIT/ML SUSP injection Inject 90 Units into the skin 3 (three) times daily with meals.    Marland Kitchen LYRICA 100 MG capsule Take 100 mg by mouth at bedtime.  0  . magnesium 30 MG tablet Take 30 mg by mouth at bedtime.     . Multiple Vitamin (MULTIVITAMIN) tablet Take 1 tablet by mouth daily.     . mycophenolate (CELLCEPT) 250 MG capsule Take 1,000 mg by mouth daily.    . nadolol (CORGARD) 40 MG tablet Take 40 mg by mouth every evening.     . Omega-3 Fatty Acids (FISH OIL PO) Take 1 capsule by mouth every evening.     Marland Kitchen omeprazole (PRILOSEC) 40 MG capsule Take 40 mg by mouth every evening.     . Oxycodone HCl 10 MG TABS Take 10 mg by mouth  every 6 (six) hours as needed (pain).    . predniSONE (DELTASONE) 50 MG tablet Take 50 mg by mouth daily with breakfast.    . TURMERIC PO Take 400 mg by mouth daily.     Marland Kitchen zolpidem (AMBIEN) 10 MG tablet Take 10 mg by mouth at bedtime.    Marland Kitchen doxycycline (VIBRAMYCIN) 100 MG capsule Take 1 capsule (100 mg total) by mouth 2 (two) times daily. One po bid x 7 days 14 capsule 0  . fluticasone (FLONASE) 50 MCG/ACT nasal spray Place 1 spray into both nostrils daily. 16 g 0   No facility-administered medications prior to  visit.     PAST MEDICAL HISTORY: Past Medical History:  Diagnosis Date  . 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 Brain tumor ([place])    hx of  . Diabetes mellitus without complication ([place])   . GERD (gastroesophageal reflux disease)   . Hypertension   . Lupus ([place])   . Morbid obesity ([place])   . Pneumonia   . Shortness of breath dyspnea   . Tobacco use disorder     PAST SURGICAL HISTORY: Past Surgical History:  Procedure Laterality Date  . CHOLECYSTECTOMY    . DILATION AND CURETTAGE OF UTERUS    . EYE SURGERY Bilateral    laser surgery   . IR TRANSCATHETER BX  11/03/2018  . IR US GUIDE VASC ACCESS RIGHT  11/03/2018  . IR VENOGRAM HEPATIC W HEMODYNAMIC EVALUATION  11/03/2018    FAMILY HISTORY: Family History  Problem Relation Age of Onset  . Diabetes Father   . Hypertension Father   . Hypertension Mother   . Kidney cancer Maternal Uncle   . Cancer Maternal Uncle        brain    SOCIAL HISTORY: Social History   Socioeconomic History  . Marital status: Single    Spouse name: Not on file  . Number of children: 2  . Years of education: Not on file  . Highest education level: Bachelor's degree (e.g., BA, AB, BS)  Occupational History    Comment: disabled  Social Needs  . Financial resource strain: Not on file  . Food insecurity    Worry: Not on file    Inability: Not on file  . Transportation needs    Medical: Not on file    Non-medical: Not on file  Tobacco Use  . Smoking status: Current Every Day Smoker    Packs/day: 1.00    Years: 30.00    Pack years: 30.00    Types: Cigarettes  . Smokeless tobacco: Never Used  Substance and Sexual Activity  . Alcohol use: No  . Drug use: No  . Sexual activity: Not on file  Lifestyle  . Physical activity    Days per week: Not on file    Minutes per session: Not on file  . Stress: Not on file  Relationships  . Social Herbalist on phone: Not on file    Gets together: Not on  file    Attends religious service: Not on file    Active member of club or organization: Not on file    Attends meetings of clubs or organizations: Not on file    Relationship status: Not on file  . Intimate partner violence    Fear of current or ex partner: Not on file    Emotionally abused: Not on file    Physically abused: Not on file  Forced sexual activity: Not on file  Other Topics Concern  . Not on file  Social History Narrative   Lives with mother   Caffeine- Diet Coke, 1 Liter     PHYSICAL EXAM  GENERAL EXAM/CONSTITUTIONAL: Vitals:  Vitals:   10/16/19 1441  BP: (!) 166/97  Pulse: 90  Temp: (!) 97.4 F (36.3 C)  Weight: (!) 330 lb (149.7 kg)  Height: _0  (1.676 m)     Body mass index is 53.26 kg/m. Wt Readings from Last 3 Encounters:  10/16/19 (!) 330 lb (149.7 kg)  06/14/19 (!) 304 lb 14.3 oz (138.3 kg)  04/07/19 280 lb (127 kg)     Patient is in no distress; well developed, nourished and groomed; neck is supple  CARDIOVASCULAR:  Examination of carotid arteries is normal; no carotid bruits  Regular rate and rhythm, no murmurs  Examination of peripheral vascular system by observation and palpation is normal  EYES:  Ophthalmoscopic exam of optic discs and posterior segments is normal; no papilledema or hemorrhages  No exam data present  MUSCULOSKELETAL:  Gait, strength, tone, movements noted in Neurologic exam below  NEUROLOGIC: MENTAL STATUS:  No flowsheet data found.  awake, alert, oriented to person, place and time  recent and remote memory intact  normal attention and concentration  language fluent, comprehension intact, naming intact  fund of knowledge appropriate  CRANIAL NERVE:   2nd - no papilledema on fundoscopic exam  2nd, 3rd, 4th, 6th - pupils equal and reactive to light, visual fields full to confrontation, extraocular muscles intact, no nystagmus  5th - facial sensation symmetric  7th - facial strength  symmetric  8th - hearing intact  9th - palate elevates symmetrically, uvula midline  11th - shoulder shrug symmetric  12th - tongue protrusion midline  MOTOR:   normal bulk and tone, full strength in the BUE; DECR IN BLE (3 PROX; 4 DISTAL)  SENSORY:   normal and symmetric to light touch, temperature  COORDINATION:   finger-nose-finger, fine finger movements normal  REFLEXES:   deep tendon reflexes TRACE and symmetric  GAIT/STATION:   IN WHEEL CHAIR     DIAGNOSTIC DATA (LABS, IMAGING, TESTING) - I reviewed patient records, labs, notes, testing and imaging myself where available.  Lab Results  Component Value Date   WBC 13.6 (H) 08/22/2019   HGB 15.8 (H) 08/22/2019   HCT 47.2 (H) 08/22/2019   MCV 99.0 08/22/2019   PLT 214 08/22/2019      Component Value Date/Time   NA 138 08/22/2019 1747   K 4.2 08/22/2019 1747   CL 100 08/22/2019 1747   CO2 29 08/22/2019 1747   GLUCOSE 67 (L) 08/22/2019 1747   BUN 16 08/22/2019 1747   CREATININE 0.90 08/22/2019 1747   CREATININE 0.79 11/23/2017 0956   CALCIUM 9.4 08/22/2019 1747   PROT 8.6 (H) 08/22/2019 1747   ALBUMIN 3.5 08/22/2019 1747   AST 55 (H) 08/22/2019 1747   ALT 66 (H) 08/22/2019 1747   ALKPHOS 42 08/22/2019 1747   BILITOT 1.3 (H) 08/22/2019 1747   GFRNONAA >60 08/22/2019 1747   GFRNONAA 83 11/23/2017 0956   GFRAA >60 08/22/2019 1747   GFRAA 96 11/23/2017 0956   No results found for: CHOL, HDL, LDLCALC, LDLDIRECT, TRIG, CHOLHDL Lab Results  Component Value Date   HGBA1C 6.3 (H) 06/14/2019   No results found for: VITAMINB12 No results found for: TSH   02/03/16 MRI brain  1. Mild scattered subcortical T2 hyperintensities bilaterally are slightly greater  than expected for age. The finding is nonspecific but can be seen in the setting of chronic microvascular ischemia, a demyelinating process such as multiple sclerosis, vasculitis, complicated migraine headaches, or as the sequelae of a prior  infectious or inflammatory process. 2. Two small meningioma is along the posterior left frontal convexity and parietal lobe measure 4 x 10 mm and 13 x 6 mm respectively. 3. No acute intracranial abnormality. No focal vascular occlusion or flow disturbances evident.    ASSESSMENT AND PLAN  58 y.o. year old female here with history of lupus and autoimmune hepatitis, remote Bell's palsy, with new left lower facial weakness since July 2020.  Also with previously diagnosed small meningiomas in left frontal and left parietal region in 2017.  Dx:  1. Facial weakness   2. Memory loss   3. Meningioma [facility])       PLAN:  - check MRI brain (with and without)   Orders Placed This Encounter  Procedures  . MR BRAIN W WO CONTRAST   Return pending test results, for pending if symptoms worsen or fail to improve.    Penni Bombard, MD 71/16/5790, 3:83 PM Certified in Neurology, Neurophysiology and Neuroimaging  [facility] [address] [place], [place] [address] [phone]

## 2019-11-05 ENCOUNTER — Ambulatory Visit: Payer: Medicare Other

## 2019-11-05 ENCOUNTER — Other Ambulatory Visit: Payer: Medicare Other

## 2019-11-08 ENCOUNTER — Other Ambulatory Visit: Payer: Self-pay | Admitting: *Deleted

## 2019-11-08 DIAGNOSIS — B20 Human immunodeficiency virus [HIV] disease: Secondary | ICD-10-CM

## 2019-11-08 DIAGNOSIS — Z113 Encounter for screening for infections with a predominantly sexual mode of transmission: Secondary | ICD-10-CM

## 2019-11-12 ENCOUNTER — Other Ambulatory Visit (HOSPITAL_COMMUNITY)
Admission: RE | Admit: 2019-11-12 | Discharge: 2019-11-12 | Disposition: A | Discharge status: Home / Self Care | Payer: Medicare Other | Source: Ambulatory Visit | Attending: Infectious Diseases | Admitting: Infectious Diseases

## 2019-11-12 ENCOUNTER — Other Ambulatory Visit: Payer: Self-pay

## 2019-11-12 ENCOUNTER — Other Ambulatory Visit: Payer: Medicare Other

## 2019-11-12 DIAGNOSIS — Z113 Encounter for screening for infections with a predominantly sexual mode of transmission: Secondary | ICD-10-CM | POA: Diagnosis present

## 2019-11-12 DIAGNOSIS — B20 Human immunodeficiency virus [HIV] disease: Secondary | ICD-10-CM

## 2019-11-13 ENCOUNTER — Ambulatory Visit
Admission: RE | Admit: 2019-11-13 | Discharge: 2019-11-13 | Disposition: A | Discharge status: Home / Self Care | Payer: Medicare Other | Source: Ambulatory Visit | Attending: Diagnostic Neuroimaging | Admitting: Diagnostic Neuroimaging

## 2019-11-13 DIAGNOSIS — R413 Other amnesia: Secondary | ICD-10-CM

## 2019-11-13 LAB — T-HELPER CELL (CD4) - (RCID CLINIC ONLY)
CD4 % Helper T Cell: 43 % (ref 33–65)
CD4 T Cell Abs: 1323 /uL (ref 400–1790)

## 2019-11-13 LAB — URINALYSIS
Bilirubin Urine: NEGATIVE
Glucose, UA: NEGATIVE
Hgb urine dipstick: NEGATIVE
Ketones, ur: NEGATIVE
Leukocytes,Ua: NEGATIVE
Nitrite: NEGATIVE
Specific Gravity, Urine: 1.011 (ref 1.001–1.03)
pH: 5.5 (ref 5.0–8.0)

## 2019-11-13 MED ORDER — GADOBENATE DIMEGLUMINE 529 MG/ML IV SOLN
20.0000 mL | Freq: Once | INTRAVENOUS | Status: AC | PRN
  Administered 2019-11-13: 20 mL via INTRAVENOUS

## 2019-11-14 LAB — URINE CYTOLOGY ANCILLARY ONLY
Chlamydia: NEGATIVE
Comment: NEGATIVE
Comment: NORMAL
Neisseria Gonorrhea: NEGATIVE

## 2019-11-15 ENCOUNTER — Telehealth: Payer: Self-pay | Admitting: *Deleted

## 2019-11-15 NOTE — Telephone Encounter (Signed)
Called the patient to inform of provider change and had to leave a message for her to call the office if she has any conflict or questions.

## 2019-11-16 ENCOUNTER — Telehealth: Payer: Self-pay

## 2019-11-16 NOTE — Telephone Encounter (Signed)
Patient called office to see if we have any recent lab results for her. Informed patient that we are still waiting on one lab and that it should be resulted by Monday. Patient is okay with this. States if she is HIV negative she would like to cancel her upcoming appointment. [place]

## 2019-11-17 LAB — COMPLETE METABOLIC PANEL WITH GFR
AG Ratio: 0.8 (calc) — ABNORMAL LOW (ref 1.0–2.5)
ALT: 68 U/L — ABNORMAL HIGH (ref 6–29)
AST: 35 U/L (ref 10–35)
Albumin: 4.3 g/dL (ref 3.6–5.1)
Alkaline phosphatase (APISO): 49 U/L (ref 37–153)
BUN: 19 mg/dL (ref 7–25)
CO2: 30 mmol/L (ref 20–32)
Calcium: 10 mg/dL (ref 8.6–10.4)
Chloride: 100 mmol/L (ref 98–110)
Creat: 0.89 mg/dL (ref 0.50–0.99)
GFR, Est African American: 82 mL/min/{1.73_m2} (ref >=60)
GFR, Est Non African American: 70 mL/min/{1.73_m2} (ref >=60)
Globulin: 5.4 g/dL (calc) — ABNORMAL HIGH (ref 1.9–3.7)
Glucose, Bld: 117 mg/dL — ABNORMAL HIGH (ref 65–99)
Potassium: 3.4 mmol/L — ABNORMAL LOW (ref 3.5–5.3)
Sodium: 139 mmol/L (ref 135–146)
Total Bilirubin: 0.7 mg/dL (ref 0.2–1.2)
Total Protein: 9.7 g/dL — ABNORMAL HIGH (ref 6.1–8.1)

## 2019-11-17 LAB — CBC WITH DIFFERENTIAL/PLATELET
Absolute Monocytes: 873 cells/uL (ref 200–950)
Basophils Absolute: 35 cells/uL (ref 0–200)
Basophils Relative: 0.3 %
Eosinophils Absolute: 47 cells/uL (ref 15–500)
Eosinophils Relative: 0.4 %
HCT: 48.1 % — ABNORMAL HIGH (ref 35.0–45.0)
Hemoglobin: 16.1 g/dL — ABNORMAL HIGH (ref 11.7–15.5)
Lymphs Abs: 3021 cells/uL (ref 850–3900)
MCH: 31.4 pg (ref 27.0–33.0)
MCHC: 33.5 g/dL (ref 32.0–36.0)
MCV: 93.8 fL (ref 80.0–100.0)
MPV: 10.1 fL (ref 7.5–12.5)
Monocytes Relative: 7.4 %
Neutro Abs: 7823 cells/uL — ABNORMAL HIGH (ref 1500–7800)
Neutrophils Relative %: 66.3 %
Platelets: 215 10*3/uL (ref 140–400)
RBC: 5.13 10*6/uL — ABNORMAL HIGH (ref 3.80–5.10)
RDW: 13.5 % (ref 11.0–15.0)
Total Lymphocyte: 25.6 %
WBC: 11.8 10*3/uL — ABNORMAL HIGH (ref 3.8–10.8)

## 2019-11-17 LAB — HIV-1 RNA ULTRAQUANT REFLEX TO GENTYP+
HIV 1 RNA Quant: <20 copies/mL
HIV-1 RNA Quant, Log: <1.3 Log copies/mL

## 2019-11-17 LAB — LIPID PANEL
Cholesterol: 170 mg/dL (ref <200)
HDL: 48 mg/dL — ABNORMAL LOW (ref >=50)
LDL Cholesterol (Calc): 87 mg/dL (calc)
Non-HDL Cholesterol (Calc): 122 mg/dL (calc) (ref <130)
Total CHOL/HDL Ratio: 3.5 (calc) (ref <5.0)
Triglycerides: 293 mg/dL — ABNORMAL HIGH (ref <150)

## 2019-11-17 LAB — QUANTIFERON-TB GOLD PLUS
Mitogen-NIL: >10 IU/mL
NIL: 0.02 IU/mL
QuantiFERON-TB Gold Plus: NEGATIVE
TB1-NIL: 0 IU/mL
TB2-NIL: 0.02 IU/mL

## 2019-11-17 LAB — HEPATITIS A ANTIBODY, TOTAL: Hepatitis A AB,Total: NONREACTIVE

## 2019-11-17 LAB — HLA B*5701: HLA-B*5701 w/rflx HLA-B High: NEGATIVE

## 2019-11-17 LAB — RPR: RPR Ser Ql: NONREACTIVE

## 2019-11-17 LAB — HEPATITIS B SURFACE ANTIGEN
Confirmation: NONREACTIVE
Hepatitis B Surface Ag: REACTIVE — AB

## 2019-11-17 LAB — HEPATITIS B CORE ANTIBODY, TOTAL: Hep B Core Total Ab: NONREACTIVE

## 2019-11-17 LAB — HEPATITIS C ANTIBODY
Hepatitis C Ab: NONREACTIVE
SIGNAL TO CUT-OFF: 0.08 (ref <1.00)

## 2019-11-17 LAB — HEPATITIS B SURFACE ANTIBODY,QUALITATIVE: Hep B S Ab: NONREACTIVE

## 2019-11-17 LAB — HIV ANTIBODY (ROUTINE TESTING W REFLEX): HIV 1&2 Ab, 4th Generation: NONREACTIVE

## 2019-11-20 ENCOUNTER — Telehealth: Payer: Self-pay | Admitting: Diagnostic Neuroimaging

## 2019-11-20 ENCOUNTER — Telehealth: Payer: Self-pay

## 2019-11-20 NOTE — Telephone Encounter (Signed)
Pt called wanting RN to go over results that have been posted on line. Please advise.

## 2019-11-20 NOTE — Telephone Encounter (Signed)
Patient is aware of all labs. States she will no longer need this appointment due to non-reactive HIV results. Patient states she has an auto immune disease and she believes her test was a false positive.  Monica Ellis

## 2019-11-20 NOTE — Telephone Encounter (Signed)
I agree with canceling the appointment.

## 2019-11-21 ENCOUNTER — Telehealth: Payer: Self-pay | Admitting: Diagnostic Neuroimaging

## 2019-11-21 NOTE — Telephone Encounter (Signed)
LVM requesting call back for MRI results.

## 2019-11-21 NOTE — Telephone Encounter (Signed)
Pt is returning call to the nurse you can reach her @ [phone]

## 2019-11-21 NOTE — Telephone Encounter (Addendum)
Returned call to patient and informed her that the MRI looks unchanged per paper report of 2017 MRI. Dr Colen Darling will need disc from 2017 to compare images. I advised she can sign a release or she may call [facility] and have them send disk. She stated she'll call [facility] but would like a release e mailed to her. She then asked more specifically about report, and her questions were answered to her satisfaction. She verbalized understanding, appreciation. Note sent to Hilda Blades, MR to e mail patient a medical release form.

## 2019-11-22 ENCOUNTER — Encounter: Payer: Medicare Other | Admitting: Infectious Diseases

## 2019-11-22 ENCOUNTER — Ambulatory Visit: Payer: Medicare Other | Admitting: Internal Medicine

## 2019-11-22 NOTE — Telephone Encounter (Signed)
Received call from patient who stated she called [facility] and requested 2017 MRI disk be mailed to Dr Leta Baptist. I thanked her.

## 2019-11-22 NOTE — Telephone Encounter (Addendum)
Spoke with patient who stated she talked to someone who told her Dr Leta Baptist could see MRI images. I advised her he had tried but unable. She then stated she had MRI at [place], [place]. I informed Dr Leta Baptist. Called [facility] and they have never scanned patient. I called patient and informed her that per [facility] she had MRI at [facility]. I gave her their # to call and request disk and advised she can choose to fill out release we e mailed to her instead, and we'll request disk. She  verbalized understanding, appreciation.

## 2019-11-22 NOTE — Telephone Encounter (Signed)
Done.

## 2019-12-04 ENCOUNTER — Encounter (HOSPITAL_COMMUNITY): Payer: Self-pay | Admitting: Emergency Medicine

## 2019-12-04 ENCOUNTER — Emergency Department (HOSPITAL_COMMUNITY)
Admission: EM | Admit: 2019-12-04 | Discharge: 2019-12-04 | Discharge status: Against Medical Advice | Payer: Medicare Other | Attending: Emergency Medicine | Admitting: Emergency Medicine

## 2019-12-04 ENCOUNTER — Other Ambulatory Visit: Payer: Self-pay

## 2019-12-04 DIAGNOSIS — Z5321 Procedure and treatment not carried out due to patient leaving prior to being seen by health care provider: Secondary | ICD-10-CM | POA: Insufficient documentation

## 2019-12-04 DIAGNOSIS — R0602 Shortness of breath: Secondary | ICD-10-CM | POA: Diagnosis present

## 2019-12-04 NOTE — ED Notes (Signed)
No answer in waiting room

## 2019-12-04 NOTE — ED Notes (Signed)
Pt waiting outside of ed to be called to room

## 2019-12-04 NOTE — ED Notes (Signed)
Called pt for vitals, no answer x2

## 2019-12-04 NOTE — ED Triage Notes (Signed)
C/o SOB, productive cough with cream colored phlegm, and nausea x 3 days.  COVID + in August.  Hx of Lupus.  States it feels like her previous pneumonia.  Reports generalized body aches.

## 2019-12-04 NOTE — ED Notes (Addendum)
Called for recheck VS x2. No response.

## 2019-12-12 ENCOUNTER — Ambulatory Visit
Admission: RE | Admit: 2019-12-12 | Discharge: 2019-12-12 | Disposition: A | Discharge status: Home / Self Care | Payer: Medicare Other | Source: Ambulatory Visit | Attending: Nurse Practitioner | Admitting: Nurse Practitioner

## 2019-12-12 ENCOUNTER — Other Ambulatory Visit: Payer: Self-pay | Admitting: Nurse Practitioner

## 2019-12-12 DIAGNOSIS — R059 Cough, unspecified: Secondary | ICD-10-CM

## 2019-12-12 DIAGNOSIS — R05 Cough: Secondary | ICD-10-CM

## 2019-12-12 DIAGNOSIS — R062 Wheezing: Secondary | ICD-10-CM

## 2020-01-01 NOTE — Telephone Encounter (Signed)
Received fax copy of MRI brain report  dated 02/03/2016, from [place] wasn't received. Hilda Blades, MR will call [facility] to check on status of MRI disk.

## 2020-01-07 ENCOUNTER — Telehealth: Payer: Self-pay | Admitting: *Deleted

## 2020-01-07 NOTE — Telephone Encounter (Signed)
Refer to note 11/20/19. Received MRI head disk from [facility]; placed on Dr [facility] desk for review and comparison to recent MRI brain.

## 2020-01-09 NOTE — Telephone Encounter (Signed)
Scan looks stable compared to 02/03/16 MRI. -VRP

## 2020-01-10 NOTE — Telephone Encounter (Signed)
LVM informing patient that Dr Leta Baptist received MRI disk from [facility]. When compared to recent MRI he stated the scan looks stable compared to 02/03/16 MRI.  Left # for questions.

## 2020-06-10 ENCOUNTER — Inpatient Hospital Stay (HOSPITAL_COMMUNITY)
Admission: EM | Admit: 2020-06-10 | Discharge: 2020-06-17 | DRG: 189 | Disposition: A | Discharge status: Home Health | Payer: Medicare Other | Attending: Internal Medicine | Admitting: Internal Medicine

## 2020-06-10 ENCOUNTER — Emergency Department (HOSPITAL_COMMUNITY): Payer: Medicare Other

## 2020-06-10 ENCOUNTER — Other Ambulatory Visit: Payer: Self-pay

## 2020-06-10 ENCOUNTER — Encounter (HOSPITAL_COMMUNITY): Payer: Self-pay | Admitting: Emergency Medicine

## 2020-06-10 DIAGNOSIS — J449 Chronic obstructive pulmonary disease, unspecified: Secondary | ICD-10-CM | POA: Diagnosis present

## 2020-06-10 DIAGNOSIS — D84821 Immunodeficiency due to drugs: Secondary | ICD-10-CM | POA: Diagnosis present

## 2020-06-10 DIAGNOSIS — J40 Bronchitis, not specified as acute or chronic: Secondary | ICD-10-CM

## 2020-06-10 DIAGNOSIS — M329 Systemic lupus erythematosus, unspecified: Secondary | ICD-10-CM | POA: Diagnosis present

## 2020-06-10 DIAGNOSIS — K7460 Unspecified cirrhosis of liver: Secondary | ICD-10-CM | POA: Diagnosis present

## 2020-06-10 DIAGNOSIS — E1142 Type 2 diabetes mellitus with diabetic polyneuropathy: Secondary | ICD-10-CM | POA: Diagnosis present

## 2020-06-10 DIAGNOSIS — Z888 Allergy status to other drugs, medicaments and biological substances status: Secondary | ICD-10-CM

## 2020-06-10 DIAGNOSIS — E119 Type 2 diabetes mellitus without complications: Secondary | ICD-10-CM

## 2020-06-10 DIAGNOSIS — K219 Gastro-esophageal reflux disease without esophagitis: Secondary | ICD-10-CM

## 2020-06-10 DIAGNOSIS — R0902 Hypoxemia: Secondary | ICD-10-CM

## 2020-06-10 DIAGNOSIS — Z79899 Other long term (current) drug therapy: Secondary | ICD-10-CM

## 2020-06-10 DIAGNOSIS — E8779 Other fluid overload: Secondary | ICD-10-CM | POA: Diagnosis present

## 2020-06-10 DIAGNOSIS — E785 Hyperlipidemia, unspecified: Secondary | ICD-10-CM | POA: Diagnosis present

## 2020-06-10 DIAGNOSIS — F17210 Nicotine dependence, cigarettes, uncomplicated: Secondary | ICD-10-CM | POA: Diagnosis present

## 2020-06-10 DIAGNOSIS — Z8051 Family history of malignant neoplasm of kidney: Secondary | ICD-10-CM

## 2020-06-10 DIAGNOSIS — Z794 Long term (current) use of insulin: Secondary | ICD-10-CM

## 2020-06-10 DIAGNOSIS — Z91041 Radiographic dye allergy status: Secondary | ICD-10-CM

## 2020-06-10 DIAGNOSIS — F329 Major depressive disorder, single episode, unspecified: Secondary | ICD-10-CM | POA: Diagnosis present

## 2020-06-10 DIAGNOSIS — J9601 Acute respiratory failure with hypoxia: Secondary | ICD-10-CM | POA: Diagnosis not present

## 2020-06-10 DIAGNOSIS — I10 Essential (primary) hypertension: Secondary | ICD-10-CM | POA: Diagnosis present

## 2020-06-10 DIAGNOSIS — Z885 Allergy status to narcotic agent status: Secondary | ICD-10-CM

## 2020-06-10 DIAGNOSIS — E559 Vitamin D deficiency, unspecified: Secondary | ICD-10-CM | POA: Diagnosis present

## 2020-06-10 DIAGNOSIS — Z7952 Long term (current) use of systemic steroids: Secondary | ICD-10-CM

## 2020-06-10 DIAGNOSIS — Z8249 Family history of ischemic heart disease and other diseases of the circulatory system: Secondary | ICD-10-CM

## 2020-06-10 DIAGNOSIS — N179 Acute kidney failure, unspecified: Secondary | ICD-10-CM

## 2020-06-10 DIAGNOSIS — K754 Autoimmune hepatitis: Secondary | ICD-10-CM | POA: Diagnosis present

## 2020-06-10 DIAGNOSIS — Z8603 Personal history of neoplasm of uncertain behavior: Secondary | ICD-10-CM

## 2020-06-10 DIAGNOSIS — E662 Morbid (severe) obesity with alveolar hypoventilation: Secondary | ICD-10-CM | POA: Diagnosis present

## 2020-06-10 DIAGNOSIS — E872 Acidosis: Secondary | ICD-10-CM | POA: Diagnosis not present

## 2020-06-10 DIAGNOSIS — Z833 Family history of diabetes mellitus: Secondary | ICD-10-CM

## 2020-06-10 DIAGNOSIS — R040 Epistaxis: Secondary | ICD-10-CM | POA: Diagnosis not present

## 2020-06-10 DIAGNOSIS — J9621 Acute and chronic respiratory failure with hypoxia: Principal | ICD-10-CM | POA: Diagnosis present

## 2020-06-10 DIAGNOSIS — J9811 Atelectasis: Secondary | ICD-10-CM | POA: Diagnosis present

## 2020-06-10 DIAGNOSIS — Z8616 Personal history of COVID-19: Secondary | ICD-10-CM

## 2020-06-10 DIAGNOSIS — Z6841 Body Mass Index (BMI) 40.0 and over, adult: Secondary | ICD-10-CM

## 2020-06-10 DIAGNOSIS — Z9049 Acquired absence of other specified parts of digestive tract: Secondary | ICD-10-CM

## 2020-06-10 LAB — I-STAT ARTERIAL BLOOD GAS, ED
Acid-Base Excess: 5 mmol/L — ABNORMAL HIGH (ref 0.0–2.0)
Bicarbonate: 32.3 mmol/L — ABNORMAL HIGH (ref 20.0–28.0)
Calcium, Ion: 1.21 mmol/L (ref 1.15–1.40)
HCT: 46 % (ref 36.0–46.0)
Hemoglobin: 15.6 g/dL — ABNORMAL HIGH (ref 12.0–15.0)
O2 Saturation: 93 %
Patient temperature: 98.4
Potassium: 3.5 mmol/L (ref 3.5–5.1)
Sodium: 144 mmol/L (ref 135–145)
TCO2: 34 mmol/L — ABNORMAL HIGH (ref 22–32)
pCO2 arterial: 55.2 mmHg — ABNORMAL HIGH (ref 32.0–48.0)
pH, Arterial: 7.375 (ref 7.350–7.450)
pO2, Arterial: 69 mmHg — ABNORMAL LOW (ref 83.0–108.0)

## 2020-06-10 LAB — CBC
HCT: 45.4 % (ref 36.0–46.0)
Hemoglobin: 13.9 g/dL (ref 12.0–15.0)
MCH: 31.2 pg (ref 26.0–34.0)
MCHC: 30.6 g/dL (ref 30.0–36.0)
MCV: 101.8 fL — ABNORMAL HIGH (ref 80.0–100.0)
Platelets: 190 10*3/uL (ref 150–400)
RBC: 4.46 MIL/uL (ref 3.87–5.11)
RDW: 16.1 % — ABNORMAL HIGH (ref 11.5–15.5)
WBC: 9.3 10*3/uL (ref 4.0–10.5)
nRBC: 0.9 % — ABNORMAL HIGH (ref 0.0–0.2)

## 2020-06-10 LAB — BASIC METABOLIC PANEL
Anion gap: 9 (ref 5–15)
BUN: 13 mg/dL (ref 8–23)
CO2: 29 mmol/L (ref 22–32)
Calcium: 9 mg/dL (ref 8.9–10.3)
Chloride: 101 mmol/L (ref 98–111)
Creatinine, Ser: 0.96 mg/dL (ref 0.44–1.00)
GFR calc Af Amer: >60 mL/min (ref >60)
GFR calc non Af Amer: >60 mL/min (ref >60)
Glucose, Bld: 113 mg/dL — ABNORMAL HIGH (ref 70–99)
Potassium: 3.9 mmol/L (ref 3.5–5.1)
Sodium: 139 mmol/L (ref 135–145)

## 2020-06-10 LAB — CBG MONITORING, ED: Glucose-Capillary: 85 mg/dL (ref 70–99)

## 2020-06-10 LAB — BRAIN NATRIURETIC PEPTIDE: B Natriuretic Peptide: 88.4 pg/mL (ref 0.0–100.0)

## 2020-06-10 MED ORDER — IPRATROPIUM BROMIDE HFA 17 MCG/ACT IN AERS
2.0000 | INHALATION_SPRAY | Freq: Once | RESPIRATORY_TRACT | Status: AC
  Administered 2020-06-10: 2 via RESPIRATORY_TRACT
  Filled 2020-06-10: qty 12.9

## 2020-06-10 MED ORDER — ALBUTEROL SULFATE HFA 108 (90 BASE) MCG/ACT IN AERS
4.0000 | INHALATION_SPRAY | Freq: Once | RESPIRATORY_TRACT | Status: AC
  Administered 2020-06-10: 4 via RESPIRATORY_TRACT
  Filled 2020-06-10: qty 6.7

## 2020-06-10 MED ORDER — INSULIN ASPART 100 UNIT/ML ~~LOC~~ SOLN
0.0000 [IU] | Freq: Three times a day (TID) | SUBCUTANEOUS | Status: DC
  Administered 2020-06-11: 11 [IU] via SUBCUTANEOUS
  Administered 2020-06-11 (×3): 3 [IU] via SUBCUTANEOUS
  Administered 2020-06-12: 5 [IU] via SUBCUTANEOUS
  Administered 2020-06-12: 8 [IU] via SUBCUTANEOUS
  Administered 2020-06-12 (×2): 3 [IU] via SUBCUTANEOUS

## 2020-06-10 NOTE — ED Triage Notes (Signed)
Pt BIB [facility] from her PCP, c/o shortness of breath x 1 week that has gotten progressively worse. Pt reports she is easily winded with just minor tasks. Hx of COVID in August 2020, states she has been doing home health and rehab since. 94% on 4L

## 2020-06-10 NOTE — ED Provider Notes (Signed)
[facility] EMERGENCY DEPARTMENT Provider Note   CSN: [id number] Arrival date & time: 06/10/20  1615     History Chief Complaint  Patient presents with  . Shortness of Breath    Monica Ellis is a 59 y.o. female.  HPI Patient reports she has had chronic shortness of breath since having Covid about a year ago.  At baseline, she does not use any home oxygen.  She does not routinely use any inhalers.  Patient does smoke about a pack of cigarettes per day.  Sometimes she cuts back.  She denies any prior diagnosis of COPD.  However, over the past 3 days she has had severe shortness of breath with even minor exertion.  Patient reports that she has a cough that is productive of some yellow sputum.  No fevers or body aches.  No chest pain.  She went to see her doctor today and when she got into the office, walking from her car she was extremely short of breath.  They measured her oxygen saturation and it was 81%.  She was referred to the emergency department for further evaluation. Patient reports that she does take Lasix regularly for some swelling in her feet and legs.  She has been taking 40 mg daily.  No pain behind the calves.    Past Medical History:  Diagnosis Date  . 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 Brain tumor ([place])    hx of  . Diabetes mellitus without complication ([place])   . GERD (gastroesophageal reflux disease)   . Hypertension   . Lupus ([place])   . Morbid obesity ([place])   . Pneumonia   . Shortness of breath dyspnea   . Tobacco use disorder     Patient Active Problem List   Diagnosis Date Noted  . Nausea vomiting and diarrhea 06/14/2019  . AKI (acute kidney injury) ([place]) 06/14/2019  . CAP (community acquired pneumonia) 02/13/2018  . Dizziness 02/12/2018  . Autoimmune hepatitis ([place]) 11/18/2017  . Essential hypertension 11/18/2017  . Class 3 severe obesity due to excess calories with serious comorbidity and body mass  index (BMI) of 40.0 to 44.9 in adult ([place]) 11/18/2017  . History of depression 11/18/2017  . Gastroesophageal reflux disease without esophagitis 11/18/2017  . Dyslipidemia 11/18/2017  . Vitamin D deficiency 11/18/2017  . History of migraine 11/18/2017  . History of meningioma 11/18/2017  . DDD (degenerative disc disease), cervical 11/18/2017  . DDD (degenerative disc disease), lumbar 11/18/2017  . Acute diverticulitis 06/07/2017  . Hypokalemia 06/07/2017  . Hypomagnesemia 06/07/2017  . Tobacco use disorder 06/07/2017  . Atelectasis of both lungs 06/07/2017  . Acute maxillary sinusitis 10/13/2016  . Abnormal LFTs   . Pain in the chest   . Diabetes mellitus with complication ([place])   . Hypoxia 12/29/2015  . Uncontrolled type 2 diabetes mellitus ([place]) 12/29/2015  . Dyspnea 12/29/2015  . LLQ pain 12/29/2015  . Lupus ([place]) 12/29/2015  . Chest pain 12/29/2015  . Transaminitis 12/29/2015  . Chronic pain 12/29/2015  . Depression 12/29/2015    Past Surgical History:  Procedure Laterality Date  . CHOLECYSTECTOMY    . DILATION AND CURETTAGE OF UTERUS    . EYE SURGERY Bilateral    laser surgery   . IR TRANSCATHETER BX  11/03/2018  . IR US GUIDE VASC ACCESS RIGHT  11/03/2018  . IR VENOGRAM HEPATIC W HEMODYNAMIC EVALUATION  11/03/2018     OB History   No  obstetric history on file.     Family History  Problem Relation Age of Onset  . Diabetes Father   . Hypertension Father   . Hypertension Mother   . Kidney cancer Maternal Uncle   . Cancer Maternal Uncle        brain    Social History   Tobacco Use  . Smoking status: Current Every Day Smoker    Packs/day: 1.00    Years: 30.00    Pack years: 30.00    Types: Cigarettes  . Smokeless tobacco: Never Used  Vaping Use  . Vaping Use: Never used  Substance Use Topics  . Alcohol use: No  . Drug use: No    Home Medications Prior to Admission medications   Medication Sig Start Date End Date Taking? Authorizing Provider   amLODipine (NORVASC) 5 MG tablet Take 5 mg by mouth every evening. 06/08/19   [provider]  Cholecalciferol (VITAMIN D PO) Take 1 tablet by mouth every evening.     [provider]  DULoxetine (CYMBALTA) 60 MG capsule Take 60 mg by mouth at bedtime.  08/26/16   [provider]  HYDROcodone-acetaminophen (NORCO) 5-325 MG tablet Take 2 tablets by mouth every 4 (four) hours as needed. 08/22/19   Julianne Rice, MD  insulin lispro protamine-lispro (HUMALOG 75/25 MIX) (75-25) 100 UNIT/ML SUSP injection Inject 90 Units into the skin 3 (three) times daily with meals.    [provider]  LYRICA 100 MG capsule Take 100 mg by mouth at bedtime. 01/23/18   [provider]  magnesium 30 MG tablet Take 30 mg by mouth at bedtime.     [provider]  Multiple Vitamin (MULTIVITAMIN) tablet Take 1 tablet by mouth daily.     [provider]  mycophenolate (CELLCEPT) 250 MG capsule Take 1,000 mg by mouth daily. 10/02/19 10/01/20  [provider]  nadolol (CORGARD) 40 MG tablet Take 40 mg by mouth every evening.  09/16/16   [provider]  Omega-3 Fatty Acids (FISH OIL PO) Take 1 capsule by mouth every evening.     [provider]  omeprazole (PRILOSEC) 40 MG capsule Take 40 mg by mouth every evening.  06/03/17   [provider]  Oxycodone HCl 10 MG TABS Take 10 mg by mouth every 6 (six) hours as needed (pain).    [provider]  predniSONE (DELTASONE) 50 MG tablet Take 50 mg by mouth daily with breakfast.    [provider]  TURMERIC PO Take 400 mg by mouth daily.     [provider]  zolpidem (AMBIEN) 10 MG tablet Take 10 mg by mouth at bedtime. 05/15/19   [provider]    Allergies    Ace inhibitors, Imuran [azathioprine], Lisinopril, Tramadol, Iodinated diagnostic agents, Iodine, Janumet [sitagliptin-metformin hcl], and Victoza [liraglutide]  Review of Systems   Review of  Systems 10 systems reviewed and negative except as per HPI Physical Exam Updated Vital Signs BP 123/76 (BP Location: Right Arm)   Pulse 60   Temp 98.4 F (36.9 C) (Oral)   Resp 20   SpO2 95%   Physical Exam Constitutional:      Comments: Alert and nontoxic.  Mild increased work of breathing at rest.  Central obesity.  HENT:     Head: Normocephalic and atraumatic.     Mouth/Throat:     Pharynx: Oropharynx is clear.  Eyes:     Extraocular Movements: Extraocular movements intact.  Cardiovascular:  Rate and Rhythm: Normal rate and regular rhythm.     Heart sounds: Normal heart sounds.  Pulmonary:     Comments: Faint wheeze at the bases.  Decreased airflow to the bases. Abdominal:     General: There is no distension.     Palpations: Abdomen is soft.     Tenderness: There is no abdominal tenderness. There is no guarding.  Musculoskeletal:        General: No swelling or tenderness. Normal range of motion.     Right lower leg: No edema.     Left lower leg: No edema.     Comments: No calf tenderness.  No pitting edema.  Patient's feet are slightly puffy and suggest previous edema although at this time no pitting.  Skin:    General: Skin is warm and dry.  Neurological:     General: No focal deficit present.     Mental Status: She is oriented to person, place, and time.     Cranial Nerves: No cranial nerve deficit.     Motor: No weakness.     Coordination: Coordination normal.  Psychiatric:        Mood and Affect: Mood normal.     ED Results / Procedures / Treatments   Labs (all labs ordered are listed, but only abnormal results are displayed) Labs Reviewed  BASIC METABOLIC PANEL - Abnormal; Notable for the following components:      Result Value   Glucose, Bld 113 (*)    All other components within normal limits  CBC - Abnormal; Notable for the following components:   MCV 101.8 (*)    RDW 16.1 (*)    nRBC 0.9 (*)    All other components within normal limits  BRAIN  NATRIURETIC PEPTIDE  CBG MONITORING, ED    EKG EKG Interpretation  Date/Time:  Tuesday June 10 2020 16:23:13 EDT Ventricular Rate:  66 PR Interval:  144 QRS Duration: 84 QT Interval:  430 QTC Calculation: 450 R Axis:   73 Text Interpretation: Normal sinus rhythm Normal ECG agree, no change from old Confirmed by Charlesetta Shanks [phone]) on 06/10/2020 9:33:33 PM   Radiology DG Chest 2 View  Result Date: 06/10/2020 CLINICAL DATA:  Shortness of breath and dizziness x1 week. EXAM: CHEST - 2 VIEW COMPARISON:  December 12, 2019 FINDINGS: Very mild diffusely increased lung markings are seen, slightly more prominent in severity when compared to the prior exam. Mild linear atelectasis is seen within the left lung base. There is no evidence of a pleural effusion or pneumothorax. The cardiac silhouette is mildly enlarged. There is mild calcification of the aortic arch. The visualized skeletal structures are unremarkable. IMPRESSION: 1. Very mild diffusely increased lung markings which may reflect mild interstitial edema. 2. Mild left basilar linear atelectasis. Electronically Signed   By: Virgina Norfolk M.D.   On: 06/10/2020 16:55    Procedures Procedures (including critical care time)  Medications Ordered in ED Medications - No data to display  ED Course  I have reviewed the triage vital signs and the nursing notes.  Pertinent labs & imaging results that were available during my care of the patient were reviewed by me and considered in my medical decision making (see chart for details).  Clinical Course as of Jun 10 2324  Tue Jun 10, 2020  2324 Consult:Dr. Cyd Silence to admit   [MP]    Clinical Course User Index [MP] Charlesetta Shanks, MD   MDM Rules/Calculators/A&P  Patient was on 4 L of oxygen on arrival to the emergency department.  I did turn oxygen off and off oxygen patient at rest is at approximately 89% room air.  She reports at baseline her oxygen  saturation is in the mid 90s without any supplemental oxygen.  With acute worsening of shortness of breath, no chest pain and clinically no significant signs of volume overload, I have high suspicion for bronchitis.  Patient denies history of COPD although with smoking history and fairly chronic cough, I have suspicion of COPD.  Will obtain ABG and treat with inhalers.  Plan for admission for new oxygen requirement. Final Clinical Impression(s) / ED Diagnoses Final diagnoses:  Bronchitis  Hypoxia    Rx / DC Orders ED Discharge Orders    None       Charlesetta Shanks, MD 06/10/20 2332

## 2020-06-11 ENCOUNTER — Observation Stay (HOSPITAL_COMMUNITY): Payer: Medicare Other

## 2020-06-11 DIAGNOSIS — E872 Acidosis: Secondary | ICD-10-CM | POA: Diagnosis not present

## 2020-06-11 DIAGNOSIS — I10 Essential (primary) hypertension: Secondary | ICD-10-CM | POA: Diagnosis present

## 2020-06-11 DIAGNOSIS — J9601 Acute respiratory failure with hypoxia: Secondary | ICD-10-CM | POA: Diagnosis present

## 2020-06-11 DIAGNOSIS — Z6841 Body Mass Index (BMI) 40.0 and over, adult: Secondary | ICD-10-CM | POA: Diagnosis not present

## 2020-06-11 DIAGNOSIS — F17210 Nicotine dependence, cigarettes, uncomplicated: Secondary | ICD-10-CM

## 2020-06-11 DIAGNOSIS — M329 Systemic lupus erythematosus, unspecified: Secondary | ICD-10-CM

## 2020-06-11 DIAGNOSIS — K219 Gastro-esophageal reflux disease without esophagitis: Secondary | ICD-10-CM

## 2020-06-11 DIAGNOSIS — I5021 Acute systolic (congestive) heart failure: Secondary | ICD-10-CM | POA: Diagnosis not present

## 2020-06-11 DIAGNOSIS — J9811 Atelectasis: Secondary | ICD-10-CM | POA: Diagnosis present

## 2020-06-11 DIAGNOSIS — F329 Major depressive disorder, single episode, unspecified: Secondary | ICD-10-CM | POA: Diagnosis present

## 2020-06-11 DIAGNOSIS — E662 Morbid (severe) obesity with alveolar hypoventilation: Secondary | ICD-10-CM | POA: Diagnosis present

## 2020-06-11 DIAGNOSIS — E1142 Type 2 diabetes mellitus with diabetic polyneuropathy: Secondary | ICD-10-CM | POA: Diagnosis present

## 2020-06-11 DIAGNOSIS — K754 Autoimmune hepatitis: Secondary | ICD-10-CM | POA: Diagnosis present

## 2020-06-11 DIAGNOSIS — Z8616 Personal history of COVID-19: Secondary | ICD-10-CM | POA: Diagnosis not present

## 2020-06-11 DIAGNOSIS — J449 Chronic obstructive pulmonary disease, unspecified: Secondary | ICD-10-CM | POA: Diagnosis present

## 2020-06-11 DIAGNOSIS — K7460 Unspecified cirrhosis of liver: Secondary | ICD-10-CM | POA: Diagnosis present

## 2020-06-11 DIAGNOSIS — Z794 Long term (current) use of insulin: Secondary | ICD-10-CM

## 2020-06-11 DIAGNOSIS — Z7952 Long term (current) use of systemic steroids: Secondary | ICD-10-CM | POA: Diagnosis not present

## 2020-06-11 DIAGNOSIS — J9621 Acute and chronic respiratory failure with hypoxia: Secondary | ICD-10-CM | POA: Diagnosis present

## 2020-06-11 DIAGNOSIS — Z8051 Family history of malignant neoplasm of kidney: Secondary | ICD-10-CM | POA: Diagnosis not present

## 2020-06-11 DIAGNOSIS — E785 Hyperlipidemia, unspecified: Secondary | ICD-10-CM | POA: Diagnosis present

## 2020-06-11 DIAGNOSIS — Z8249 Family history of ischemic heart disease and other diseases of the circulatory system: Secondary | ICD-10-CM | POA: Diagnosis not present

## 2020-06-11 DIAGNOSIS — E559 Vitamin D deficiency, unspecified: Secondary | ICD-10-CM | POA: Diagnosis present

## 2020-06-11 DIAGNOSIS — D84821 Immunodeficiency due to drugs: Secondary | ICD-10-CM | POA: Diagnosis present

## 2020-06-11 DIAGNOSIS — E8779 Other fluid overload: Secondary | ICD-10-CM | POA: Diagnosis present

## 2020-06-11 DIAGNOSIS — R040 Epistaxis: Secondary | ICD-10-CM | POA: Diagnosis not present

## 2020-06-11 LAB — COMPREHENSIVE METABOLIC PANEL
ALT: 166 U/L — ABNORMAL HIGH (ref 0–44)
AST: 96 U/L — ABNORMAL HIGH (ref 15–41)
Albumin: 3.1 g/dL — ABNORMAL LOW (ref 3.5–5.0)
Alkaline Phosphatase: 48 U/L (ref 38–126)
Anion gap: 10 (ref 5–15)
BUN: 17 mg/dL (ref 8–23)
CO2: 28 mmol/L (ref 22–32)
Calcium: 8.6 mg/dL — ABNORMAL LOW (ref 8.9–10.3)
Chloride: 99 mmol/L (ref 98–111)
Creatinine, Ser: 0.95 mg/dL (ref 0.44–1.00)
GFR calc Af Amer: >60 mL/min (ref >60)
GFR calc non Af Amer: >60 mL/min (ref >60)
Glucose, Bld: 160 mg/dL — ABNORMAL HIGH (ref 70–99)
Potassium: 3.4 mmol/L — ABNORMAL LOW (ref 3.5–5.1)
Sodium: 137 mmol/L (ref 135–145)
Total Bilirubin: 1 mg/dL (ref 0.3–1.2)
Total Protein: 10.1 g/dL — ABNORMAL HIGH (ref 6.5–8.1)

## 2020-06-11 LAB — CBC WITH DIFFERENTIAL/PLATELET
Abs Immature Granulocytes: 0.09 10*3/uL — ABNORMAL HIGH (ref 0.00–0.07)
Basophils Absolute: 0.1 10*3/uL (ref 0.0–0.1)
Basophils Relative: 1 %
Eosinophils Absolute: 0.2 10*3/uL (ref 0.0–0.5)
Eosinophils Relative: 2 %
HCT: 43.8 % (ref 36.0–46.0)
Hemoglobin: 13.9 g/dL (ref 12.0–15.0)
Immature Granulocytes: 1 %
Lymphocytes Relative: 27 %
Lymphs Abs: 2.7 10*3/uL (ref 0.7–4.0)
MCH: 31.4 pg (ref 26.0–34.0)
MCHC: 31.7 g/dL (ref 30.0–36.0)
MCV: 99.1 fL (ref 80.0–100.0)
Monocytes Absolute: 0.7 10*3/uL (ref 0.1–1.0)
Monocytes Relative: 7 %
Neutro Abs: 6.1 10*3/uL (ref 1.7–7.7)
Neutrophils Relative %: 62 %
Platelets: 194 10*3/uL (ref 150–400)
RBC: 4.42 MIL/uL (ref 3.87–5.11)
RDW: 16 % — ABNORMAL HIGH (ref 11.5–15.5)
WBC: 9.7 10*3/uL (ref 4.0–10.5)
nRBC: 0.6 % — ABNORMAL HIGH (ref 0.0–0.2)

## 2020-06-11 LAB — ECHOCARDIOGRAM COMPLETE
Height: 65.5 in
Weight: 5421.55 oz

## 2020-06-11 LAB — GLUCOSE, CAPILLARY
Glucose-Capillary: 198 mg/dL — ABNORMAL HIGH (ref 70–99)
Glucose-Capillary: 173 mg/dL — ABNORMAL HIGH (ref 70–99)
Glucose-Capillary: 305 mg/dL — ABNORMAL HIGH (ref 70–99)
Glucose-Capillary: 142 mg/dL — ABNORMAL HIGH (ref 70–99)
Glucose-Capillary: 188 mg/dL — ABNORMAL HIGH (ref 70–99)

## 2020-06-11 LAB — HEPATIC FUNCTION PANEL
ALT: 167 U/L — ABNORMAL HIGH (ref 0–44)
AST: 98 U/L — ABNORMAL HIGH (ref 15–41)
Albumin: 3.1 g/dL — ABNORMAL LOW (ref 3.5–5.0)
Alkaline Phosphatase: 41 U/L (ref 38–126)
Bilirubin, Direct: 0.2 mg/dL (ref 0.0–0.2)
Indirect Bilirubin: 0.7 mg/dL (ref 0.3–0.9)
Total Bilirubin: 0.9 mg/dL (ref 0.3–1.2)
Total Protein: 10.7 g/dL — ABNORMAL HIGH (ref 6.5–8.1)

## 2020-06-11 LAB — TROPONIN I (HIGH SENSITIVITY)
Troponin I (High Sensitivity): 13 ng/L (ref <18)
Troponin I (High Sensitivity): 15 ng/L (ref <18)

## 2020-06-11 LAB — SARS CORONAVIRUS 2 BY RT PCR (HOSPITAL ORDER, PERFORMED IN ~~LOC~~ HOSPITAL LAB): SARS Coronavirus 2: NEGATIVE

## 2020-06-11 LAB — HEMOGLOBIN A1C
Hgb A1c MFr Bld: 6.4 % — ABNORMAL HIGH (ref 4.8–5.6)
Mean Plasma Glucose: 136.98 mg/dL

## 2020-06-11 LAB — D-DIMER, QUANTITATIVE: D-Dimer, Quant: 1.17 ug/mL-FEU — ABNORMAL HIGH (ref 0.00–0.50)

## 2020-06-11 LAB — C-REACTIVE PROTEIN: CRP: 1 mg/dL — ABNORMAL HIGH (ref <1.0)

## 2020-06-11 LAB — MAGNESIUM: Magnesium: 1.3 mg/dL — ABNORMAL LOW (ref 1.7–2.4)

## 2020-06-11 LAB — HIV ANTIBODY (ROUTINE TESTING W REFLEX): HIV Screen 4th Generation wRfx: REACTIVE — AB

## 2020-06-11 MED ORDER — ENOXAPARIN SODIUM 40 MG/0.4ML ~~LOC~~ SOLN
40.0000 mg | Freq: Every day | SUBCUTANEOUS | Status: DC
  Administered 2020-06-11: 40 mg via SUBCUTANEOUS
  Filled 2020-06-11: qty 0.4

## 2020-06-11 MED ORDER — POLYETHYLENE GLYCOL 3350 17 G PO PACK
17.0000 g | PACK | Freq: Every day | ORAL | Status: DC | PRN
  Administered 2020-06-13: 17 g via ORAL
  Filled 2020-06-11 (×2): qty 1

## 2020-06-11 MED ORDER — DULOXETINE HCL 60 MG PO CPEP
60.0000 mg | ORAL_CAPSULE | Freq: Every day | ORAL | Status: DC
  Administered 2020-06-11 – 2020-06-16 (×6): 60 mg via ORAL
  Filled 2020-06-11 (×6): qty 1

## 2020-06-11 MED ORDER — NADOLOL 40 MG PO TABS
40.0000 mg | ORAL_TABLET | Freq: Every evening | ORAL | Status: DC
  Administered 2020-06-11 – 2020-06-16 (×6): 40 mg via ORAL
  Filled 2020-06-11 (×7): qty 1

## 2020-06-11 MED ORDER — FUROSEMIDE 10 MG/ML IJ SOLN
60.0000 mg | Freq: Two times a day (BID) | INTRAMUSCULAR | Status: DC
  Administered 2020-06-11 – 2020-06-12 (×2): 60 mg via INTRAVENOUS
  Filled 2020-06-11 (×3): qty 6

## 2020-06-11 MED ORDER — AMLODIPINE BESYLATE 5 MG PO TABS: 5.0000 mg | ORAL_TABLET | Freq: Every evening | ORAL | Status: DC

## 2020-06-11 MED ORDER — PREDNISONE 5 MG PO TABS
12.5000 mg | ORAL_TABLET | Freq: Every day | ORAL | Status: DC
  Administered 2020-06-11 – 2020-06-17 (×7): 12.5 mg via ORAL
  Filled 2020-06-11 (×8): qty 1

## 2020-06-11 MED ORDER — POTASSIUM CHLORIDE CRYS ER 20 MEQ PO TBCR
40.0000 meq | EXTENDED_RELEASE_TABLET | Freq: Two times a day (BID) | ORAL | Status: DC
  Administered 2020-06-11 – 2020-06-12 (×3): 40 meq via ORAL
  Filled 2020-06-11 (×3): qty 2

## 2020-06-11 MED ORDER — FUROSEMIDE 10 MG/ML IJ SOLN
40.0000 mg | Freq: Two times a day (BID) | INTRAMUSCULAR | Status: DC
  Administered 2020-06-11 (×2): 40 mg via INTRAVENOUS
  Filled 2020-06-11: qty 4

## 2020-06-11 MED ORDER — OXYCODONE HCL 5 MG PO TABS
10.0000 mg | ORAL_TABLET | Freq: Four times a day (QID) | ORAL | Status: DC | PRN
  Administered 2020-06-11 – 2020-06-13 (×7): 10 mg via ORAL
  Filled 2020-06-11 (×7): qty 2

## 2020-06-11 MED ORDER — SODIUM CHLORIDE 0.9 % IV SOLN
2.0000 g | INTRAVENOUS | Status: DC
  Administered 2020-06-11: 2 g via INTRAVENOUS
  Filled 2020-06-11: qty 20

## 2020-06-11 MED ORDER — SODIUM CHLORIDE 0.9 % IV SOLN
INTRAVENOUS | Status: DC | PRN
  Administered 2020-06-11: 250 mL via INTRAVENOUS

## 2020-06-11 MED ORDER — ONDANSETRON HCL 4 MG/2ML IJ SOLN
4.0000 mg | Freq: Four times a day (QID) | INTRAMUSCULAR | Status: DC | PRN
  Administered 2020-06-11 – 2020-06-13 (×3): 4 mg via INTRAVENOUS
  Filled 2020-06-11 (×2): qty 2

## 2020-06-11 MED ORDER — ONDANSETRON HCL 4 MG PO TABS: 4.0000 mg | ORAL_TABLET | Freq: Four times a day (QID) | ORAL | Status: DC | PRN

## 2020-06-11 MED ORDER — ENOXAPARIN SODIUM 80 MG/0.8ML ~~LOC~~ SOLN
75.0000 mg | Freq: Every day | SUBCUTANEOUS | Status: DC
  Administered 2020-06-12 – 2020-06-17 (×6): 75 mg via SUBCUTANEOUS
  Filled 2020-06-11 (×7): qty 0.8

## 2020-06-11 MED ORDER — INSULIN ASPART PROT & ASPART (70-30 MIX) 100 UNIT/ML ~~LOC~~ SUSP
30.0000 [IU] | Freq: Two times a day (BID) | SUBCUTANEOUS | Status: DC
  Administered 2020-06-11: 30 [IU] via SUBCUTANEOUS
  Filled 2020-06-11: qty 10

## 2020-06-11 MED ORDER — ACETAMINOPHEN 650 MG RE SUPP: 325.0000 mg | Freq: Four times a day (QID) | RECTAL | Status: DC | PRN

## 2020-06-11 MED ORDER — ZOLPIDEM TARTRATE 5 MG PO TABS
5.0000 mg | ORAL_TABLET | Freq: Every day | ORAL | Status: DC
  Administered 2020-06-11 – 2020-06-16 (×7): 5 mg via ORAL
  Filled 2020-06-11 (×7): qty 1

## 2020-06-11 MED ORDER — PANTOPRAZOLE SODIUM 40 MG PO TBEC
40.0000 mg | DELAYED_RELEASE_TABLET | Freq: Every day | ORAL | Status: DC
  Filled 2020-06-11 (×3): qty 1

## 2020-06-11 MED ORDER — SODIUM CHLORIDE 0.9 % IV SOLN
500.0000 mg | INTRAVENOUS | Status: DC
  Administered 2020-06-11: 500 mg via INTRAVENOUS
  Filled 2020-06-11: qty 500

## 2020-06-11 MED ORDER — MYCOPHENOLATE MOFETIL 250 MG PO CAPS: 1000.0000 mg | ORAL_CAPSULE | Freq: Every day | ORAL | Status: DC

## 2020-06-11 MED ORDER — PREGABALIN 100 MG PO CAPS: 100.0000 mg | ORAL_CAPSULE | Freq: Every day | ORAL | Status: DC

## 2020-06-11 MED ORDER — TECHNETIUM TO 99M ALBUMIN AGGREGATED
4.3000 | Freq: Once | INTRAVENOUS | Status: AC | PRN
  Administered 2020-06-11: 4.3 via INTRAVENOUS

## 2020-06-11 MED ORDER — MYCOPHENOLATE SODIUM 180 MG PO TBEC
720.0000 mg | DELAYED_RELEASE_TABLET | Freq: Two times a day (BID) | ORAL | Status: DC
  Administered 2020-06-11 – 2020-06-17 (×13): 720 mg via ORAL
  Filled 2020-06-11 (×14): qty 4

## 2020-06-11 MED ORDER — ACETAMINOPHEN 325 MG PO TABS
325.0000 mg | ORAL_TABLET | Freq: Four times a day (QID) | ORAL | Status: DC | PRN
  Administered 2020-06-12 – 2020-06-16 (×6): 325 mg via ORAL
  Filled 2020-06-11 (×6): qty 1

## 2020-06-11 NOTE — ED Notes (Signed)
Attempted report

## 2020-06-11 NOTE — Progress Notes (Signed)
  Echocardiogram 2D Echocardiogram has been performed.  Monica Ellis 06/11/2020, 10:38 AM

## 2020-06-11 NOTE — Progress Notes (Signed)
Received report from [facility]. Room ready for patient.

## 2020-06-11 NOTE — Progress Notes (Signed)
Patient refused bed alarm. Patient advised to call for assistance before getting out of bed. Patient verbalized understanding. Call bell within reach.

## 2020-06-11 NOTE — Plan of Care (Signed)
  Problem: Clinical Measurements: Goal: Ability to maintain clinical measurements within normal limits will improve Outcome: Progressing   Problem: Safety: Goal: Ability to remain free from injury will improve Outcome: Progressing

## 2020-06-11 NOTE — Progress Notes (Signed)
Patient seen and examined, admitted earlier this morning by Dr. Cyd Silence, briefly Tempestt Silba is a 59 year old chronically ill female with past history of SLE, autoimmune hepatitis on chronic prednisone, liver cirrhosis, morbid obesity, BMI of 55, type 2 diabetes mellitus, long-term tobacco use presented to the emergency room 7/6 evening with worsening dyspnea over 3 days, previously reported worsening of fatigue and dyspnea ever since she had Covid in 2020. -Chest x-ray notable for fluid overload, hypoxic O2 sats of 81% at PCPs office yesterday and was sent to the ED  Acute hypoxic respiratory failure -Clinically suspect fluid overload which may be secondary to congestive heart failure or underlying cirrhosis, hypoxic requiring 4 L of O2 at this time -Also suspect underlying OSA/OHS and component of chronic lung disease from long-term tobacco abuse -Continue IV Lasix today -Monitor I's/O, daily weights -Follow-up 2D echocardiogram -Discontinue antibiotics, no evidence of pneumonia noted on CT chest or x-ray -Wean O2, ambulate  SLE Autoimmune hepatitis Liver cirrhosis -Followed by [facility] -Continue home regimen of prednisone 12.5 mg daily  Type 2 diabetes mellitus -Follow-up hemoglobin A1c continue insulin 70/30 at a lower dose  Tobacco abuse  -Counseled -Needs PFTs to determine if she has COPD  Super morbid obesity -BMI is 55.5 -Needs diet/lifestyle modification and eventually tapered off prednisone  Domenic Polite, MD

## 2020-06-11 NOTE — H&P (Addendum)
History and Physical    Monica Ellis MGQ:[id number] DOB: [birth year] DOA: 06/10/2020  PCP: Ferd Hibbs, NP  Patient coming from: PCP office   Chief Complaint:  Chief Complaint  Patient presents with  . Shortness of Breath     HPI:    59 year old female with past medical history of systemic lupus erythematosus, autoimmune hepatitis, hypertension, obesity, insulin-dependent diabetes mellitus type 2 and nicotine dependence who presents to [facility] emergency department with complaints of shortness of breath.  Patient explains that ever since she was diagnosed with Covid in 2020, she has felt generalized fatigue and dyspnea on exertion.  Patient has been blaming her symptoms on this past Covid infection.  In the past several days to weeks however, patient shortness of breath has progressively worsened, particularly in the past 3 days.  Shortness of breath has become severe, worse with minimal exertion and improved with rest.  With the patient's worsening symptoms, she has developed associated cough, productive with "tan" colored sputum.  Patient also complains of pillow orthopnea and episodes of paroxysmal nocturnal dyspnea.  Patient denies chest pain, fevers contacts, recent travel or confirmed contact with COVID-19 infection.  Of note, patient states that her usual regimen of Lasix was discontinued approximately 1 month ago.  Patient states that she has no past medical history of congestive heart failure and that she was taking this Lasix for blood pressure.  Patient symptoms continue to worsen until she presented to see her primary care provider earlier in the day on 7/6.  While being evaluated in her primary care provider's office patient was found to be hypoxic with oxygen saturations of 81%.  As of this point the patient was sent to [facility] emergency department for evaluation.  Upon evaluation in the emergency department, patient was confirmed to be hypoxic  requiring 3-1/2 L of supplemental oxygen via nasal cannula.  Chest x-ray was performed that did not reveal any obvious dense focal infiltrate.  Due to patient's persisting new oxygen requirement the hospitalist group has been called to assess the patient for admission the hospital.   Review of Systems: A 10-system review of systems has been performed and all systems are negative with the exception of what is listed in the HPI.    Past Medical History:  Diagnosis Date  . 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 Brain tumor ([place])    hx of  . Diabetes mellitus without complication ([place])   . GERD (gastroesophageal reflux disease)   . Hypertension   . Lupus ([place])   . Morbid obesity ([place])   . Pneumonia   . Shortness of breath dyspnea   . Tobacco use disorder     Past Surgical History:  Procedure Laterality Date  . CHOLECYSTECTOMY    . DILATION AND CURETTAGE OF UTERUS    . EYE SURGERY Bilateral    laser surgery   . IR TRANSCATHETER BX  11/03/2018  . IR US GUIDE VASC ACCESS RIGHT  11/03/2018  . IR VENOGRAM HEPATIC W HEMODYNAMIC EVALUATION  11/03/2018     reports that she has been smoking cigarettes. She has a 30.00 pack-year smoking history. She has never used smokeless tobacco. She reports that she does not drink alcohol and does not use drugs.  Allergies  Allergen Reactions  . Ace Inhibitors Swelling  . Imuran [Azathioprine] Nausea And Vomiting    Severe N/V/D and AKI after starting the med.  . Lisinopril Swelling and Other (See  Comments)    Other reaction(s): Angioedema (ALLERGY/intolerance), Face  . Tramadol Other (See Comments)    Other reaction(s): Mental Status Changes (intolerance)  . Iodinated Diagnostic Agents Swelling    When patient was in her 20's, swelled up all over after getting getting injection of contrast; did not have any other symptoms/ was given benadryl after that happened and had no further problems per patients/  . Iodine Swelling   . Janumet [Sitagliptin-Metformin Hcl] Other (See Comments)    Continuous yeast infection  . Victoza [Liraglutide] Nausea And Vomiting    Family History  Problem Relation Age of Onset  . Diabetes Father   . Hypertension Father   . Hypertension Mother   . Kidney cancer Maternal Uncle   . Cancer Maternal Uncle        brain     Prior to Admission medications   Medication Sig Start Date End Date Taking? Authorizing Provider  amLODipine (NORVASC) 5 MG tablet Take 5 mg by mouth every evening. 06/08/19   [provider]  Cholecalciferol (VITAMIN D PO) Take 1 tablet by mouth every evening.     [provider]  DULoxetine (CYMBALTA) 60 MG capsule Take 60 mg by mouth at bedtime.  08/26/16   [provider]  HYDROcodone-acetaminophen (NORCO) 5-325 MG tablet Take 2 tablets by mouth every 4 (four) hours as needed. 08/22/19   Julianne Rice, MD  insulin lispro protamine-lispro (HUMALOG 75/25 MIX) (75-25) 100 UNIT/ML SUSP injection Inject 90 Units into the skin 3 (three) times daily with meals.    [provider]  LYRICA 100 MG capsule Take 100 mg by mouth at bedtime. 01/23/18   [provider]  magnesium 30 MG tablet Take 30 mg by mouth at bedtime.     [provider]  Multiple Vitamin (MULTIVITAMIN) tablet Take 1 tablet by mouth daily.     [provider]  mycophenolate (CELLCEPT) 250 MG capsule Take 1,000 mg by mouth daily. 10/02/19 10/01/20  [provider]  nadolol (CORGARD) 40 MG tablet Take 40 mg by mouth every evening.  09/16/16   [provider]  Omega-3 Fatty Acids (FISH OIL PO) Take 1 capsule by mouth every evening.     [provider]  omeprazole (PRILOSEC) 40 MG capsule Take 40 mg by mouth every evening.  06/03/17   [provider]  Oxycodone HCl 10 MG TABS Take 10 mg by mouth every 6 (six) hours as needed (pain).    [provider]  predniSONE (DELTASONE) 50 MG tablet Take 50 mg by  mouth daily with breakfast.    [provider]  TURMERIC PO Take 400 mg by mouth daily.     [provider]  zolpidem (AMBIEN) 10 MG tablet Take 10 mg by mouth at bedtime. 05/15/19   [provider]    Physical Exam: Vitals:   06/10/20 1815 06/10/20 1910 06/10/20 1943 06/10/20 2024  BP: (!) 151/76 (!) 142/67 124/74 123/76  Pulse: 60 (!) 58 61 60  Resp: _0 Temp: 98.4 F (36.9 C) 98.4 F (36.9 C)    TempSrc: Oral Oral    SpO2: 96% 96% 94% 95%    Constitutional: Acute alert and oriented x3, no associated distress.  Patient is obese Skin: no rashes, no lesions, good skin turgor noted. Eyes: Pupils are equally reactive to light.  No evidence of scleral icterus or conjunctival pallor.  ENMT: Moist mucous membranes noted.  Posterior pharynx clear of any exudate or  lesions.   Neck: normal, supple, no masses, no thyromegaly.  No evidence of jugular venous distension.   Respiratory: Occasional expiratory wheezing noted, primarily in the upper lung fields bilaterally.  Markedly diminished breath sounds at the bases .  Faint bibasilar rales.  Normal respiratory effort. No accessory muscle use.  Cardiovascular: Regular rate and rhythm, no murmurs / rubs / gallops. No extremity edema. 2+ pedal pulses. No carotid bruits.  Chest:   Nontender without crepitus or deformity.   Back:   Nontender without crepitus or deformity. Abdomen: Abdomen is protuberant but soft and nontender.  No evidence of intra-abdominal masses.  Positive bowel sounds noted in all quadrants.   Musculoskeletal: No joint deformity upper and lower extremities. Good ROM, no contractures. Normal muscle tone.  Neurologic: CN 2-12 grossly intact. Sensation intact, strength noted to be 5 out of 5 in all 4 extremities.  Patient is following all commands.  Patient is responsive to verbal stimuli.   Psychiatric: Patient presents as a normal mood with appropriate affect.  Patient seems to possess insight as  to theircurrent situation.     Labs on Admission: I have personally reviewed following labs and imaging studies -   CBC: Recent Labs  Lab 06/10/20 1630 06/10/20 2343  WBC 9.3  --   HGB 13.9 15.6*  HCT 45.4 46.0  MCV 101.8*  --   PLT 190  --    Basic Metabolic Panel: Recent Labs  Lab 06/10/20 1630 06/10/20 2343  NA 139 144  K 3.9 3.5  CL 101  --   CO2 29  --   GLUCOSE 113*  --   BUN 13  --   CREATININE 0.96  --   CALCIUM 9.0  --    GFR: CrCl cannot be calculated (Unknown ideal weight.). Liver Function Tests: No results for input(s): AST, ALT, ALKPHOS, BILITOT, PROT, ALBUMIN in the last 168 hours. No results for input(s): LIPASE, AMYLASE in the last 168 hours. No results for input(s): AMMONIA in the last 168 hours. Coagulation Profile: No results for input(s): INR, PROTIME in the last 168 hours. Cardiac Enzymes: No results for input(s): CKTOTAL, CKMB, CKMBINDEX, TROPONINI in the last 168 hours. BNP (last 3 results) No results for input(s): PROBNP in the last 8760 hours. HbA1C: No results for input(s): HGBA1C in the last 72 hours. CBG: Recent Labs  Lab 06/10/20 1935  GLUCAP 85   Lipid Profile: No results for input(s): CHOL, HDL, LDLCALC, TRIG, CHOLHDL, LDLDIRECT in the last 72 hours. Thyroid Function Tests: No results for input(s): TSH, T4TOTAL, FREET4, T3FREE, THYROIDAB in the last 72 hours. Anemia Panel: No results for input(s): VITAMINB12, FOLATE, FERRITIN, TIBC, IRON, RETICCTPCT in the last 72 hours. Urine analysis:    Component Value Date/Time   COLORURINE YELLOW 11/12/2019 1450   APPEARANCEUR CLEAR 11/12/2019 1450   LABSPEC 1.011 11/12/2019 1450   PHURINE 5.5 11/12/2019 1450   GLUCOSEU NEGATIVE 11/12/2019 1450   HGBUR NEGATIVE 11/12/2019 1450   BILIRUBINUR NEGATIVE 06/14/2019 0751   KETONESUR NEGATIVE 11/12/2019 1450   PROTEINUR 2+ (A) 11/12/2019 1450   NITRITE NEGATIVE 11/12/2019 1450   LEUKOCYTESUR NEGATIVE 11/12/2019 1450    Radiological  Exams on Admission - Personally Reviewed: DG Chest 2 View  Result Date: 06/10/2020 CLINICAL DATA:  Shortness of breath and dizziness x1 week. EXAM: CHEST - 2 VIEW COMPARISON:  December 12, 2019 FINDINGS: Very mild diffusely increased lung markings are seen, slightly more prominent in severity when compared to the prior exam. Mild linear atelectasis  is seen within the left lung base. There is no evidence of a pleural effusion or pneumothorax. The cardiac silhouette is mildly enlarged. There is mild calcification of the aortic arch. The visualized skeletal structures are unremarkable. IMPRESSION: 1. Very mild diffusely increased lung markings which may reflect mild interstitial edema. 2. Mild left basilar linear atelectasis. Electronically Signed   By: Virgina Norfolk M.D.   On: 06/10/2020 16:55    EKG: Personally reviewed.  Rhythm is normal sinus rhythm with heart rate of 66 bpm.  No dynamic ST segment changes appreciated.  Assessment/Plan Principal Problem:   Acute respiratory failure with hypoxia [facility])   Patient is presenting with substantial new onset oxygen requirement.  ER provider has ordered an ABG on this patient however unfortunately the ABG was performed on supplemental oxygen mainly PaO2 is not truly indicative of the severity of the patient's hypoxia.  Evaluation the patient is obscured by multiple factors, including the fact that patient suffers from significant obesity and is extremely immunocompromised due to the presence of ongoing mycophenolate use.  Based on patient's presentation, most likely cause of the patient's symptoms would be possible new onset acute congestive heart failure.  Provided patient with a trial with intravenous Lasix every 12 to address this possibility.  Echocardiogram ordered for the morning  Additionally, patient's chest x-ray is difficult to read due to the patient's body habitus.  It is possible that patient has an early atypical pneumonia or lower lobe  pneumonia and is not presenting with typical symptoms due to her immunocompromised state.  Obtaining CT imaging of the chest to further evaluate for the possibility of underlying pneumonia.  We will treat patient with empiric antibiotic therapy for now until this imaging is complete which can be quickly de-escalated and discontinued if work-up is negative for pneumonia.  In the meantime, providing patient with bronchodilator therapy in case of associated bronchospasm.  Active Problems:   Lupus ([place])   Continue home regimen of mycophenolate  Continuing 12.5 mg of prednisone daily   Continue outpatient follow-up    Controlled type 2 diabetes mellitus with diabetic polyneuropathy, with long-term current use of insulin ([place])   Accu-Cheks before every meal and nightly with sliding scale insulin  Hemoglobin A1c ordered and is pending  Reordering substantially reduced dosing of patient's home regimen of Novolog 70/30 due to tenuous oral intake  Continue home regimen of Lyrica    Autoimmune hepatitis [facility])  Patient currently follows with [facility] gastroenterology for management  Continue outpatient follow-up  Care everywhere is not connecting at this time and therefore I am unable to verify information concerning patient's management at this time.    Nicotine dependence, cigarettes, uncomplicated   Counseling patient on smoking cessation  Patient declines nicotine replacement therapy    GERD without esophagitis  Continue home regimen of daily PPI   Code Status:  Full code Family Communication: deferred   Status is: Observation  The patient remains OBS appropriate and will d/c before 2 midnights.  Dispo: The patient is from: Home              Anticipated d/c is to: Home              Anticipated d/c date is: 2 days              Patient currently is not medically stable to d/c.        Vernelle Emerald MD [facility] Pager [phone]  If 7PM-7AM, please  contact  night-coverage [email or web address] Use universal [place] password for that web site. If you do not have the password, please call the hospital operator.  06/11/2020, 12:25 AM

## 2020-06-11 NOTE — Progress Notes (Signed)
New Admission Note:   Arrival Method: Arrived from ED via stretcher. Mental Orientation: Alert and oriented x4 Telemetry: Box #3 Assessment: Completed Skin: intact IV:Rt Hand-NSL Pain: 0/10 Tubes: n/A Safety Measures: Safety Fall Prevention Plan has been discussed.  Admission: Completed 5MW Orientation: Patient has been orientated to the room, unit and staff.  Family: None at bedside  Orders have been reviewed and implemented. Will continue to monitor the patient. Call light has been placed within reach and bed alarm has been activated.   Monica Ellis [employer], RN-BC Phone number: [phone]

## 2020-06-12 LAB — GLUCOSE, CAPILLARY
Glucose-Capillary: 185 mg/dL — ABNORMAL HIGH (ref 70–99)
Glucose-Capillary: 236 mg/dL — ABNORMAL HIGH (ref 70–99)
Glucose-Capillary: 259 mg/dL — ABNORMAL HIGH (ref 70–99)
Glucose-Capillary: 256 mg/dL — ABNORMAL HIGH (ref 70–99)
Glucose-Capillary: 163 mg/dL — ABNORMAL HIGH (ref 70–99)

## 2020-06-12 LAB — CBC
HCT: 45 % (ref 36.0–46.0)
Hemoglobin: 14.1 g/dL (ref 12.0–15.0)
MCH: 31.1 pg (ref 26.0–34.0)
MCHC: 31.3 g/dL (ref 30.0–36.0)
MCV: 99.3 fL (ref 80.0–100.0)
Platelets: 183 10*3/uL (ref 150–400)
RBC: 4.53 MIL/uL (ref 3.87–5.11)
RDW: 15.9 % — ABNORMAL HIGH (ref 11.5–15.5)
WBC: 8.3 10*3/uL (ref 4.0–10.5)
nRBC: 0.7 % — ABNORMAL HIGH (ref 0.0–0.2)

## 2020-06-12 LAB — BASIC METABOLIC PANEL
Anion gap: 9 (ref 5–15)
BUN: 18 mg/dL (ref 8–23)
CO2: 30 mmol/L (ref 22–32)
Calcium: 9 mg/dL (ref 8.9–10.3)
Chloride: 95 mmol/L — ABNORMAL LOW (ref 98–111)
Creatinine, Ser: 1.12 mg/dL — ABNORMAL HIGH (ref 0.44–1.00)
GFR calc Af Amer: >60 mL/min (ref >60)
GFR calc non Af Amer: 53 mL/min — ABNORMAL LOW (ref >60)
Glucose, Bld: 172 mg/dL — ABNORMAL HIGH (ref 70–99)
Potassium: 4.1 mmol/L (ref 3.5–5.1)
Sodium: 134 mmol/L — ABNORMAL LOW (ref 135–145)

## 2020-06-12 MED ORDER — FUROSEMIDE 10 MG/ML IJ SOLN
40.0000 mg | Freq: Two times a day (BID) | INTRAMUSCULAR | Status: DC
  Administered 2020-06-12: 40 mg via INTRAVENOUS
  Filled 2020-06-12 (×2): qty 4

## 2020-06-12 MED ORDER — INSULIN ASPART PROT & ASPART (70-30 MIX) 100 UNIT/ML ~~LOC~~ SUSP
60.0000 [IU] | Freq: Two times a day (BID) | SUBCUTANEOUS | Status: DC
  Administered 2020-06-12 (×2): 60 [IU] via SUBCUTANEOUS
  Filled 2020-06-12: qty 10

## 2020-06-12 MED ORDER — IPRATROPIUM-ALBUTEROL 0.5-2.5 (3) MG/3ML IN SOLN
3.0000 mL | Freq: Two times a day (BID) | RESPIRATORY_TRACT | Status: DC
  Administered 2020-06-12 – 2020-06-15 (×7): 3 mL via RESPIRATORY_TRACT
  Filled 2020-06-12 (×7): qty 3

## 2020-06-12 MED ORDER — IPRATROPIUM-ALBUTEROL 0.5-2.5 (3) MG/3ML IN SOLN
3.0000 mL | Freq: Four times a day (QID) | RESPIRATORY_TRACT | Status: DC | PRN
  Administered 2020-06-12: 3 mL via RESPIRATORY_TRACT
  Filled 2020-06-12: qty 3

## 2020-06-12 MED ORDER — POTASSIUM CHLORIDE CRYS ER 20 MEQ PO TBCR
40.0000 meq | EXTENDED_RELEASE_TABLET | Freq: Every day | ORAL | Status: DC
  Administered 2020-06-13 – 2020-06-17 (×5): 40 meq via ORAL
  Filled 2020-06-12 (×6): qty 2

## 2020-06-12 NOTE — Progress Notes (Addendum)
PROGRESS NOTE    Monica Ellis  HGD:[id number] DOB: 04/13/1961 DOA: 06/10/2020 PCP: Ferd Hibbs, NP  Brief Narrative: Monica Ellis is a 59 year old chronically ill female with past history of SLE, autoimmune hepatitis on chronic prednisone, liver cirrhosis, morbid obesity, BMI of 55, type 2 diabetes mellitus, long-term tobacco use presented to the emergency room 7/6 evening with worsening dyspnea over 3 days, previously reported worsening of fatigue and dyspnea ever since she had Covid in 2020. -Chest x-ray notable for fluid overload, hypoxic O2 sats of 81% at PCPs office  and was sent to the ED   Assessment & Plan:  Acute hypoxic respiratory failure -Clinically suspect fluid overload due to underlying cirrhosis, hypoxic requiring 3-4 L of O2 at this time -Also suspect underlying OSA/OHS and component of chronic lung disease from long-term tobacco abuse -2D echo with preserved EF, moderate LVH, no diastolic dysfunction noted -Continue IV Lasix today -She is 2.6 L negative -Wean O2 -Ambulate, PT eval -Low-grade temp, will monitor for now -Needs outpatient sleep study  Positive HIV screen -Follow-up confirmatory test  SLE Autoimmune hepatitis Liver cirrhosis -Followed by [facility] -Continue home regimen of prednisone 12.5 mg daily  Type 2 diabetes mellitus -hemoglobin A1c is 6.4, increase continue insulin 70/30 to 60 units twice daily  Tobacco abuse  -Counseled -Needs PFTs to determine if she has COPD  Super morbid obesity -BMI is 55.5 -Needs diet/lifestyle modification and eventually tapered off prednisone  DVT prophylaxis: Lovenox-held today due to nose bleed Code Status: Full code Family Communication: No family at bedside Disposition Plan:  Status is: Inpatient  Remains inpatient appropriate because:Inpatient level of care appropriate due to severity of illness   Dispo: The patient is from: Home              Anticipated d/c is to: Home               Anticipated d/c date is: 2 days              Patient currently is not medically stable to d/c.  Procedures:   Antimicrobials:    Subjective: -Complains of headache, reports that this happens when her blood sugars are high  Objective: Vitals:   06/12/20 0329 06/12/20 0502 06/12/20 0902 06/12/20 0924  BP:  121/66 (!) 148/89   Pulse:  78 83   Resp:  20    Temp: (!) 101.2 F (38.4 C) 98.9 F (37.2 C) 99.6 F (37.6 C)   TempSrc: Oral Oral Oral   SpO2:  94% (!) 88% 90%  Weight:      Height:        Intake/Output Summary (Last 24 hours) at 06/12/2020 1104 Last data filed at 06/12/2020 1000 Gross per 24 hour  Intake 460 ml  Output 2400 ml  Net -1940 ml   Filed Weights   06/11/20 0153  Weight: (!) 153.7 kg    Examination:  General exam: Obese pleasant chronically ill female sitting up in bed, AAOx3 Respiratory system: Distant breath sounds, decreased at the bases Cardiovascular system: S1 & S2 heard, RRR.  Gastrointestinal system: Abdomen is nondistended, soft and nontender.Normal bowel sounds heard. Central nervous system: Alert and oriented. No focal neurological deficits. Extremities: Trace edema Skin: No rashes on exposed Psychiatry:  Mood & affect appropriate.     Data Reviewed:   CBC: Recent Labs  Lab 06/10/20 1630 06/10/20 2343 06/11/20 0502 06/12/20 0346  WBC 9.3  --  9.7 8.3  NEUTROABS  --   --  6.1  --   HGB 13.9 15.6* 13.9 14.1  HCT 45.4 46.0 43.8 45.0  MCV 101.8*  --  99.1 99.3  PLT 190  --  194 875   Basic Metabolic Panel: Recent Labs  Lab 06/10/20 1630 06/10/20 2343 06/11/20 0502 06/12/20 0346  NA 139 144 137 134*  K 3.9 3.5 3.4* 4.1  CL 101  --  99 95*  CO2 29  --  28 30  GLUCOSE 113*  --  160* 172*  BUN 13  --  17 18  CREATININE 0.96  --  0.95 1.12*  CALCIUM 9.0  --  8.6* 9.0  MG  --   --  1.3*  --    GFR: Estimated Creatinine Clearance: 80.3 mL/min (A) (by C-G formula based on SCr of 1.12 mg/dL (H)). Liver Function  Tests: Recent Labs  Lab 06/11/20 0006 06/11/20 0502  AST 98* 96*  ALT 167* 166*  ALKPHOS 41 48  BILITOT 0.9 1.0  PROT 10.7* 10.1*  ALBUMIN 3.1* 3.1*   No results for input(s): LIPASE, AMYLASE in the last 168 hours. No results for input(s): AMMONIA in the last 168 hours. Coagulation Profile: No results for input(s): INR, PROTIME in the last 168 hours. Cardiac Enzymes: No results for input(s): CKTOTAL, CKMB, CKMBINDEX, TROPONINI in the last 168 hours. BNP (last 3 results) No results for input(s): PROBNP in the last 8760 hours. HbA1C: Recent Labs    06/11/20 0502  HGBA1C 6.4*   CBG: Recent Labs  Lab 06/11/20 0640 06/11/20 1307 06/11/20 1651 06/11/20 2116 06/12/20 0640  GLUCAP 173* 305* 142* 188* 185*   Lipid Profile: No results for input(s): CHOL, HDL, LDLCALC, TRIG, CHOLHDL, LDLDIRECT in the last 72 hours. Thyroid Function Tests: No results for input(s): TSH, T4TOTAL, FREET4, T3FREE, THYROIDAB in the last 72 hours. Anemia Panel: No results for input(s): VITAMINB12, FOLATE, FERRITIN, TIBC, IRON, RETICCTPCT in the last 72 hours. Urine analysis:    Component Value Date/Time   COLORURINE YELLOW 11/12/2019 1450   APPEARANCEUR CLEAR 11/12/2019 1450   LABSPEC 1.011 11/12/2019 1450   PHURINE 5.5 11/12/2019 1450   GLUCOSEU NEGATIVE 11/12/2019 1450   HGBUR NEGATIVE 11/12/2019 1450   BILIRUBINUR NEGATIVE 06/14/2019 0751   KETONESUR NEGATIVE 11/12/2019 1450   PROTEINUR 2+ (A) 11/12/2019 1450   NITRITE NEGATIVE 11/12/2019 1450   LEUKOCYTESUR NEGATIVE 11/12/2019 1450   Sepsis Labs: _0 (procalcitonin:4,lacticidven:4)  ) Recent Results (from the past 240 hour(s))  SARS Coronavirus 2 by RT PCR (hospital order, performed in [place] hospital lab) Nasopharyngeal Nasopharyngeal Swab     Status: None   Collection Time: 06/10/20 11:34 PM   Specimen: Nasopharyngeal Swab  Result Value Ref Range Status   SARS Coronavirus 2 NEGATIVE NEGATIVE Final    Comment:  (NOTE) SARS-CoV-2 target nucleic acids are NOT DETECTED.  The SARS-CoV-2 RNA is generally detectable in upper and lower respiratory specimens during the acute phase of infection. The lowest concentration of SARS-CoV-2 viral copies this assay can detect is 250 copies / mL. A negative result does not preclude SARS-CoV-2 infection and should not be used as the sole basis for treatment or other patient management decisions.  A negative result may occur with improper specimen collection / handling, submission of specimen other than nasopharyngeal swab, presence of viral mutation(s) within the areas targeted by this assay, and inadequate number of viral copies (<250 copies / mL). A negative result must be combined with clinical observations, patient history, and epidemiological information.  Fact Sheet for Patients:  StrictlyIdeas.no  Fact Sheet for Healthcare Providers: BankingDealers.co.za  This test is not yet approved or  cleared by the [place] FDA and has been authorized for detection and/or diagnosis of SARS-CoV-2 by FDA under an Emergency Use Authorization (EUA).  This EUA will remain in effect (meaning this test can be used) for the duration of the COVID-19 declaration under Section 564(b)(1) of the Act, 21 U.S.C. section 360bbb-3(b)(1), unless the authorization is terminated or revoked sooner.  Performed at [facility], [place] [address], [place], [place] [address]   Culture, blood (routine x 2) Call MD if unable to obtain prior to antibiotics being given     Status: None (Preliminary result)   Collection Time: 06/11/20  4:52 AM   Specimen: BLOOD  Result Value Ref Range Status   Specimen Description BLOOD LEFT ARM  Final   Special Requests   Final    BOTTLES DRAWN AEROBIC AND ANAEROBIC Blood Culture adequate volume   Culture   Final    NO GROWTH 1 DAY Performed at [facility], [place] [address], [place],  [place] [address]    Report Status PENDING  Incomplete  Culture, blood (routine x 2) Call MD if unable to obtain prior to antibiotics being given     Status: None (Preliminary result)   Collection Time: 06/11/20  5:07 AM   Specimen: BLOOD  Result Value Ref Range Status   Specimen Description BLOOD LEFT HAND  Final   Special Requests   Final    BOTTLES DRAWN AEROBIC AND ANAEROBIC Blood Culture adequate volume   Culture   Final    NO GROWTH 1 DAY Performed at [facility], [place] [address], [place], [place] [address]    Report Status PENDING  Incomplete         Radiology Studies: DG Chest 2 View  Result Date: 06/10/2020 CLINICAL DATA:  Shortness of breath and dizziness x1 week. EXAM: CHEST - 2 VIEW COMPARISON:  December 12, 2019 FINDINGS: Very mild diffusely increased lung markings are seen, slightly more prominent in severity when compared to the prior exam. Mild linear atelectasis is seen within the left lung base. There is no evidence of a pleural effusion or pneumothorax. The cardiac silhouette is mildly enlarged. There is mild calcification of the aortic arch. The visualized skeletal structures are unremarkable. IMPRESSION: 1. Very mild diffusely increased lung markings which may reflect mild interstitial edema. 2. Mild left basilar linear atelectasis. Electronically Signed   By: Virgina Norfolk M.D.   On: 06/10/2020 16:55   CT CHEST WO CONTRAST  Result Date: 06/11/2020 CLINICAL DATA:  Shortness of breath EXAM: CT CHEST WITHOUT CONTRAST TECHNIQUE: Multidetector CT imaging of the chest was performed following the standard protocol without IV contrast. COMPARISON:  Chest x-ray from the previous day. FINDINGS: Cardiovascular: Thoracic aorta demonstrates atherosclerotic calcifications. No cardiac enlargement is noted. Coronary calcifications are seen. Pulmonary artery is not significantly enlarged. Mediastinum/Nodes: Thoracic inlet is within normal limits. Scattered small lymph nodes are noted  although not significant by size criteria. The esophagus is within normal limits. Lungs/Pleura: Mild bibasilar atelectatic changes are noted. No sizable effusion is seen. No sizable parenchymal nodule is seen. No pneumothorax is noted. Upper Abdomen: Visualized upper abdomen demonstrates nodularity of the liver consistent with underlying cirrhosis. No definitive mass lesion is seen although evaluation is limited due to lack of IV contrast. Musculoskeletal: Degenerative changes of the thoracic spine are noted. IMPRESSION: Mild bibasilar atelectatic changes. No other focal abnormality  is noted. Aortic Atherosclerosis (ICD10-I70.0). Electronically Signed   By: Inez Catalina M.D.   On: 06/11/2020 00:51   NM Pulmonary Perfusion  Result Date: 06/11/2020 CLINICAL DATA:  Shortness of breath for 3 days. EXAM: NUCLEAR MEDICINE PERFUSION LUNG SCAN TECHNIQUE: Perfusion images were obtained in multiple projections after intravenous injection of radiopharmaceutical. Ventilation scans intentionally deferred if perfusion scan and chest x-ray adequate for interpretation during COVID 19 epidemic. RADIOPHARMACEUTICALS:  4.3 mCi Tc-47mMAA IV COMPARISON:  CT chest today and PA and lateral chest 06/10/2020. FINDINGS: No perfusion defect is seen. IMPRESSION: Negative for pulmonary embolus. Electronically Signed   By: TInge RiseM.D.   On: 06/11/2020 12:07   ECHOCARDIOGRAM COMPLETE  Result Date: 06/11/2020    ECHOCARDIOGRAM REPORT   Patient Name:   CCHASTELYNSMITH-MCIVER Date of Exam: 06/11/2020 Medical Rec #:  [id number]           Height:       65.5 in Accession #:    [id number]          Weight:       338.8 lb Date of Birth:  [birth year]           BSA:          2.489 m Patient Age:    89years             BP:           137/68 mmHg Patient Gender: F                    HR:           72 bpm. Exam Location:  Inpatient Procedure: 2D Echo Indications:    428.21 CHF  History:        Patient has prior history of Echocardiogram  examinations, most                 recent 06/08/2017. Risk Factors:Hypertension and Diabetes. Tobacco                 use disorder.  Sonographer:    VJannett CelestineRDCS (AE) Referring Phys: [id number] BurgerSTexas Regional Eye [facility] Asc LLC Sonographer Comments: Suboptimal parasternal window and patient is morbidly obese. Image acquisition challenging due to patient body habitus. off axis apical window IMPRESSIONS  1. Left ventricular ejection fraction, by estimation, is 60 to 65%. The left ventricle has normal function. The left ventricle has no regional wall motion abnormalities. There is moderate left ventricular hypertrophy. Left ventricular diastolic parameters were normal.  2. Right ventricular systolic function is normal. The right ventricular size is normal.  3. Left atrial size was mildly dilated.  4. The mitral valve is normal in structure. No evidence of mitral valve regurgitation. No evidence of mitral stenosis.  5. The aortic valve was not well visualized. Aortic valve regurgitation is not visualized. No aortic stenosis is present.  6. The inferior vena cava is normal in size with greater than 50% respiratory variability, suggesting right atrial pressure of 3 mmHg. FINDINGS  Left Ventricle: Left ventricular ejection fraction, by estimation, is 60 to 65%. The left ventricle has normal function. The left ventricle has no regional wall motion abnormalities. The left ventricular internal cavity size was normal in size. There is  moderate left ventricular hypertrophy. Left ventricular diastolic parameters were normal. Right Ventricle: The right ventricular size is normal. No increase in right ventricular wall thickness. Right ventricular systolic function is normal. Left Atrium: Left atrial size was  mildly dilated. Right Atrium: Right atrial size was normal in size. Pericardium: There is no evidence of pericardial effusion. Mitral Valve: The mitral valve is normal in structure. Normal mobility of the mitral valve leaflets. No evidence  of mitral valve regurgitation. No evidence of mitral valve stenosis. Tricuspid Valve: The tricuspid valve is normal in structure. Tricuspid valve regurgitation is not demonstrated. No evidence of tricuspid stenosis. Aortic Valve: The aortic valve was not well visualized. Aortic valve regurgitation is not visualized. No aortic stenosis is present. Pulmonic Valve: The pulmonic valve was normal in structure. Pulmonic valve regurgitation is not visualized. No evidence of pulmonic stenosis. Aorta: The aortic root is normal in size and structure. Venous: The inferior vena cava is normal in size with greater than 50% respiratory variability, suggesting right atrial pressure of 3 mmHg. IAS/Shunts: The interatrial septum was not well visualized.  LEFT VENTRICLE PLAX 2D LVIDd:         4.83 cm  Diastology LVIDs:         3.27 cm  LV e' lateral:   9.03 cm/s LV PW:         1.10 cm  LV E/e' lateral: 10.8 LV IVS:        1.60 cm LVOT diam:     2.00 cm LV SV:         39 LV SV Index:   16 LVOT Area:     3.14 cm  LEFT ATRIUM           Index LA diam:      4.30 cm 1.73 cm/m LA Vol (A2C): 55.7 ml 22.38 ml/m  AORTIC VALVE LVOT Vmax:   70.80 cm/s LVOT Vmean:  48.700 cm/s LVOT VTI:    0.125 m  AORTA Ao Root diam: 2.90 cm MITRAL VALVE MV Area (PHT): 2.95 cm    SHUNTS MV Decel Time: 257 msec    Systemic VTI:  0.12 m MV E velocity: 97.90 cm/s  Systemic Diam: 2.00 cm MV A velocity: 69.50 cm/s MV E/A ratio:  1.41 Jenkins Rouge MD Electronically signed by Jenkins Rouge MD Signature Date/Time: 06/11/2020/11:10:50 AM    Final         Scheduled Meds: . DULoxetine  60 mg Oral QHS  . enoxaparin (LOVENOX) injection  75 mg Subcutaneous Daily  . furosemide  60 mg Intravenous BID  . insulin aspart  0-15 Units Subcutaneous TID AC & HS  . insulin aspart protamine- aspart  60 Units Subcutaneous BID WC  . mycophenolate  720 mg Oral BID  . nadolol  40 mg Oral QPM  . pantoprazole  40 mg Oral Daily  . potassium chloride  40 mEq Oral BID  .  predniSONE  12.5 mg Oral Q breakfast  . zolpidem  5 mg Oral QHS   Continuous Infusions: . sodium chloride Stopped (06/11/20 0603)     LOS: 1 day    Time spent: 22mn    PDomenic Polite MD [facility]  06/12/2020, 11:04 AM

## 2020-06-12 NOTE — Progress Notes (Signed)
PT Cancellation Note  Patient Details Name: Monica Ellis MRN: [id number] DOB: December 13, 1960   Cancelled Treatment:    Reason Eval/Treat Not Completed: Other (comment) Pt declined PT today x 2 due to headache and nose bleed.  Will f/u as able. Abran Richard, PT Acute Rehab Services Pager [phone] [facility] [place] 06/12/2020, 1:12 PM

## 2020-06-12 NOTE — Plan of Care (Signed)
  Problem: Clinical Measurements: Goal: Respiratory complications will improve Outcome: Progressing   Problem: Activity: Goal: Risk for activity intolerance will decrease Outcome: Progressing

## 2020-06-13 LAB — BASIC METABOLIC PANEL
Anion gap: 9 (ref 5–15)
BUN: 29 mg/dL — ABNORMAL HIGH (ref 8–23)
CO2: 29 mmol/L (ref 22–32)
Calcium: 8.8 mg/dL — ABNORMAL LOW (ref 8.9–10.3)
Chloride: 97 mmol/L — ABNORMAL LOW (ref 98–111)
Creatinine, Ser: 1.15 mg/dL — ABNORMAL HIGH (ref 0.44–1.00)
GFR calc Af Amer: 59 mL/min — ABNORMAL LOW (ref >60)
GFR calc non Af Amer: 51 mL/min — ABNORMAL LOW (ref >60)
Glucose, Bld: 127 mg/dL — ABNORMAL HIGH (ref 70–99)
Potassium: 3.8 mmol/L (ref 3.5–5.1)
Sodium: 135 mmol/L (ref 135–145)

## 2020-06-13 LAB — BLOOD GAS, ARTERIAL
Acid-Base Excess: 9.9 mmol/L — ABNORMAL HIGH (ref 0.0–2.0)
Bicarbonate: 35.8 mmol/L — ABNORMAL HIGH (ref 20.0–28.0)
Drawn by: 336832
FIO2: 44
O2 Saturation: 92.8 %
Patient temperature: 37
pCO2 arterial: 68.8 mmHg (ref 32.0–48.0)
pH, Arterial: 7.337 — ABNORMAL LOW (ref 7.350–7.450)
pO2, Arterial: 71 mmHg — ABNORMAL LOW (ref 83.0–108.0)

## 2020-06-13 LAB — GLUCOSE, CAPILLARY
Glucose-Capillary: 124 mg/dL — ABNORMAL HIGH (ref 70–99)
Glucose-Capillary: 132 mg/dL — ABNORMAL HIGH (ref 70–99)
Glucose-Capillary: 147 mg/dL — ABNORMAL HIGH (ref 70–99)
Glucose-Capillary: 156 mg/dL — ABNORMAL HIGH (ref 70–99)
Glucose-Capillary: 200 mg/dL — ABNORMAL HIGH (ref 70–99)
Glucose-Capillary: 231 mg/dL — ABNORMAL HIGH (ref 70–99)

## 2020-06-13 LAB — CBC
HCT: 45 % (ref 36.0–46.0)
Hemoglobin: 14 g/dL (ref 12.0–15.0)
MCH: 31.1 pg (ref 26.0–34.0)
MCHC: 31.1 g/dL (ref 30.0–36.0)
MCV: 100 fL (ref 80.0–100.0)
Platelets: 184 10*3/uL (ref 150–400)
RBC: 4.5 MIL/uL (ref 3.87–5.11)
RDW: 15.5 % (ref 11.5–15.5)
WBC: 9.7 10*3/uL (ref 4.0–10.5)
nRBC: 0 % (ref 0.0–0.2)

## 2020-06-13 LAB — HIV-1 RNA QUANT-NO REFLEX-BLD
HIV 1 RNA Quant: <20 copies/mL
LOG10 HIV-1 RNA: UNDETERMINED log10copy/mL

## 2020-06-13 MED ORDER — FUROSEMIDE 10 MG/ML IJ SOLN
60.0000 mg | Freq: Two times a day (BID) | INTRAMUSCULAR | Status: DC
  Administered 2020-06-13 – 2020-06-14 (×3): 60 mg via INTRAVENOUS
  Filled 2020-06-13 (×2): qty 6

## 2020-06-13 MED ORDER — INSULIN ASPART PROT & ASPART (70-30 MIX) 100 UNIT/ML ~~LOC~~ SUSP
100.0000 [IU] | Freq: Two times a day (BID) | SUBCUTANEOUS | Status: DC
  Administered 2020-06-13 – 2020-06-14 (×3): 100 [IU] via SUBCUTANEOUS
  Filled 2020-06-13: qty 10

## 2020-06-13 MED ORDER — OXYCODONE HCL 5 MG PO TABS
5.0000 mg | ORAL_TABLET | Freq: Four times a day (QID) | ORAL | Status: AC | PRN
  Administered 2020-06-13 – 2020-06-14 (×2): 5 mg via ORAL
  Filled 2020-06-13 (×2): qty 1

## 2020-06-13 MED ORDER — INSULIN ASPART 100 UNIT/ML ~~LOC~~ SOLN
0.0000 [IU] | Freq: Three times a day (TID) | SUBCUTANEOUS | Status: DC
  Administered 2020-06-13: 3 [IU] via SUBCUTANEOUS
  Administered 2020-06-13: 1 [IU] via SUBCUTANEOUS
  Administered 2020-06-14: 2 [IU] via SUBCUTANEOUS
  Administered 2020-06-14: 3 [IU] via SUBCUTANEOUS
  Administered 2020-06-15 – 2020-06-16 (×2): 2 [IU] via SUBCUTANEOUS
  Administered 2020-06-16: 1 [IU] via SUBCUTANEOUS
  Administered 2020-06-16 – 2020-06-17 (×2): 2 [IU] via SUBCUTANEOUS
  Administered 2020-06-17: 5 [IU] via SUBCUTANEOUS

## 2020-06-13 MED ORDER — KETOROLAC TROMETHAMINE 15 MG/ML IJ SOLN
15.0000 mg | Freq: Once | INTRAMUSCULAR | Status: AC
  Administered 2020-06-13: 15 mg via INTRAVENOUS
  Filled 2020-06-13: qty 1

## 2020-06-13 NOTE — Evaluation (Signed)
Physical Therapy Evaluation Patient Details Name: Monica Ellis MRN: [id number] DOB: 10-02-1961 Today's Date: 06/13/2020   History of Present Illness  Patient is a 59 y/o female who presents from PCP due to hypoxia; also with SOB and fatigue. Admitted with acute respiratory failure with hypoxia. CXR-fluid overload. PMH includes SLE, morbid obesity, brain tumor, DM2, HTN, autoimmune hepatitis, COVID 19.  Clinical Impression  Patient presents with generalized weakness, dyspnea at rest- worsened with exertion, decreased activity tolerance, headache and impaired mobility s/p above. Pt lives with her mother and reports being independent with ADLs and uses rollator for ambulation short distances PTA. Limited mainly due to weakness and SOB. Has been working with [facility]. Today, session limited due to new and increased 02 needs, headache and drop in Sp02 with standing. Able to perform mobility at min guard level with use of RW. Sp02 90% at rest on 6L/min 02, dropped to 86% with standing on 6L/min 02. RN aware. Awaiting ABG blood test. Will follow acutely to maximize independence and mobility prior to return home.     Follow Up Recommendations Home health PT;Supervision for mobility/OOB (continue HHPT)    Equipment Recommendations  None recommended by PT    Recommendations for Other Services       Precautions / Restrictions Precautions Precautions: Other (comment);Fall Precaution Comments: watch 02, hx of falls Restrictions Weight Bearing Restrictions: No      Mobility  Bed Mobility Overal bed mobility: Needs Assistance Bed Mobility: Supine to Sit     Supine to sit: Supervision;HOB elevated     General bed mobility comments: Sitting up in bed without trunk support due to headache and SOB. Able to get to EOB without assist. SP02 90% at rest on 6L/min 02 [place].  Transfers Overall transfer level: Needs assistance Equipment used: Rolling walker (2 wheeled) Transfers: Sit to/from  Stand Sit to Stand: Supervision         General transfer comment: Supervision for safety. Stood from [employer]. SP02 dropped to 86% on 6L/min 02 [place]. Headache not worse with standing. Deferred walking due to awaiting ABG, new and increasing 02 needs this AM and Sp02.  Ambulation/Gait                Stairs            Wheelchair Mobility    Modified Rankin (Stroke Patients Only)       Balance Overall balance assessment: Mild deficits observed, not formally tested                                           Pertinent Vitals/Pain Pain Assessment: Faces Faces Pain Scale: Hurts whole lot Pain Location: head Pain Descriptors / Indicators: Headache Pain Intervention(s): Monitored during session;Repositioned;Limited activity within patient's tolerance;RN gave pain meds during session    Home Living Family/patient expects to be discharged to:: Private residence Living Arrangements: Parent Available Help at Discharge: Family;Available PRN/intermittently Type of Home: House Home Access: Stairs to enter Entrance Stairs-Rails: Left Entrance Stairs-Number of Steps: 5 Home Layout: One level Home Equipment: Walker - 4 wheels;Cane - single point;Shower seat;Grab bars - tub/shower      Prior Function Level of Independence: Needs assistance   Gait / Transfers Assistance Needed: Uses rollator PTA. Has been gettimg HHPT. Fell 2 weeks ago.  ADL's / Homemaking Assistance Needed: Independent with ADLs. Trouble showering due to SOB.  Hand Dominance        Extremity/Trunk Assessment   Upper Extremity Assessment Upper Extremity Assessment: Defer to OT evaluation    Lower Extremity Assessment Lower Extremity Assessment: Generalized weakness (but functional)    Cervical / Trunk Assessment Cervical / Trunk Assessment: Normal  Communication   Communication: No difficulties  Cognition Arousal/Alertness: Lethargic;Awake/alert Behavior During Therapy:  WFL for tasks assessed/performed Overall Cognitive Status: Impaired/Different from baseline                                 General Comments: Reports not feeling at cognitive baseline; lethargic at times. Distracted by headache. Reports slower processing.      General Comments General comments (skin integrity, edema, etc.): Sp02 90% at rest on 6L 02, dropped to 86% with standing on 6L. RN aware. Awaiting ABG blood test.    Exercises     Assessment/Plan    PT Assessment Patient needs continued PT services  PT Problem List Decreased strength;Decreased mobility;Pain;Decreased balance;Decreased activity tolerance;Cardiopulmonary status limiting activity       PT Treatment Interventions Therapeutic activities;Gait training;Therapeutic exercise;Patient/family education;Balance training;Functional mobility training;Stair training    PT Goals (Current goals can be found in the Care Plan section)  Acute Rehab PT Goals Patient Stated Goal: to be able to breathe PT Goal Formulation: With patient Time For Goal Achievement: 06/27/20 Potential to Achieve Goals: Good    Frequency Min 3X/week   Barriers to discharge        Co-evaluation               AM-PAC PT "6 Clicks" Mobility  Outcome Measure Help needed turning from your back to your side while in a flat bed without using bedrails?: None Help needed moving from lying on your back to sitting on the side of a flat bed without using bedrails?: A Little Help needed moving to and from a bed to a chair (including a wheelchair)?: A Little Help needed standing up from a chair using your arms (e.g., wheelchair or bedside chair)?: A Little Help needed to walk in hospital room?: A Little Help needed climbing 3-5 steps with a railing? : A Little 6 Click Score: 19    End of Session Equipment Utilized During Treatment: Oxygen Activity Tolerance: Treatment limited secondary to medical complications (Comment);Patient limited  by pain (drop in SP02, headache) Patient left: in bed;with call bell/phone within reach;with nursing/sitter in room Nurse Communication: Mobility status;Other (comment) (Sp02 with little mobility.) PT Visit Diagnosis: Pain;Muscle weakness (generalized) (M62.81);Other abnormalities of gait and mobility (R26.89) Pain - part of body:  (head)    Time: 8413-2440 PT Time Calculation (min) (ACUTE ONLY): 17 min   Charges:   PT Evaluation $PT Eval Moderate Complexity: 1 Mod          Marisa Severin, PT, DPT Acute Rehabilitation Services Pager [phone] Office [phone]      Marguarite Arbour A Cameron 06/13/2020, 10:45 AM

## 2020-06-13 NOTE — Plan of Care (Signed)
  Problem: Nutrition: Goal: Adequate nutrition will be maintained Outcome: Progressing

## 2020-06-13 NOTE — Progress Notes (Addendum)
PROGRESS NOTE    Monica Ellis  GBM:[id number] DOB: August 18, 1961 DOA: 06/10/2020 PCP: Ferd Hibbs, NP  Brief Narrative: Monica Ellis is a 59 year old chronically ill female with past history of SLE, autoimmune hepatitis on chronic prednisone, liver cirrhosis, morbid obesity, BMI of 55, type 2 diabetes mellitus, long-term tobacco use presented to the emergency room 7/6 evening with worsening dyspnea over 3 days, previously reported worsening of fatigue and dyspnea for a year ever since she had Covid in 2020. -Chest x-ray notable for mild interstitial edema, hypoxic O2 sats of 81% at PCPs office  and was sent to the ED. -CT chest noted basilar atelectasis without acute findings -VQ scan negative for PE, 2D echo normal EF, moderate LVH, no diastolic dysfunction   Assessment & Plan:  Acute on chronic hypoxic respiratory failure -Multifactorial, a component of fluid overload due to underlying cirrhosis -Also suspect underlying OSA/OHS and component of chronic lung disease/COPD from long-term tobacco abuse -2D echo with preserved EF, moderate LVH, no diastolic dysfunction noted, VQ scan negative for PE -Continue IV Lasix today -She is 2.7 L negative -Wean O2, will check ABG suspect a significant component of hypercarbia 2, a bit drowsy this morning, also stop home dose of Oxycodone -Ambulate, PT eval -Needs outpatient sleep study and pulmonary follow-up  Positive HIV screen -Follow-up confirmatory test  SLE Autoimmune hepatitis Liver cirrhosis -Followed by [facility] -Continued on home regimen of prednisone 12.5 mg daily, goal is to wean this down as tolerated  Type 2 diabetes mellitus -hemoglobin A1c is 6.4, increase continue insulin 70/30 to 100 units twice daily, closer to home regimen and change sliding scale insulin to sensitive  Tobacco abuse  -Counseled -Needs PFTs to determine if she has COPD  Super morbid obesity -BMI is 55.5 -Needs diet/lifestyle  modification and eventually tapered off prednisone  DVT prophylaxis: Lovenox- Code Status: Full code Family Communication: No family at bedside Disposition Plan:  Status is: Inpatient  Remains inpatient appropriate because:Inpatient level of care appropriate due to severity of illness   Dispo: The patient is from: Home              Anticipated d/c is to: Home              Anticipated d/c date is: 2-3 days              Patient currently is not medically stable to d/c.  Procedures:   Antimicrobials:    Subjective: -Reports headache, continues to have dyspnea with minimal activity  Objective: Vitals:   06/13/20 0528 06/13/20 0737 06/13/20 0924 06/13/20 1105  BP: 125/72  (!) 153/89   Pulse: 70  77 75  Resp: _0 Temp: 98.5 F (36.9 C)  98.7 F (37.1 C)   TempSrc:   Oral   SpO2: 92% 91% 92% 94%  Weight:      Height:        Intake/Output Summary (Last 24 hours) at 06/13/2020 1109 Last data filed at 06/13/2020 0800 Gross per 24 hour  Intake 600 ml  Output 1001 ml  Net -401 ml   Filed Weights   06/11/20 0153 06/12/20 2040  Weight: (!) 153.7 kg (!) 153.7 kg    Examination:  General exam: Morbidly obese chronically ill female sitting up in bed, AAOx3 HEENT: Neck obese unable to assess JVD CVS: S1-S2, regular rate rhythm Lungs: Distant breath sounds, few basilar rales Abdomen: Soft, nontender, bowel sounds present Extremities: No edema  Neuro : Somnolent but  arousable and interactive, oriented x3, no focal deficits  Skin: No rashes on exposed skin Psychiatry:  Mood & affect appropriate.     Data Reviewed:   CBC: Recent Labs  Lab 06/10/20 1630 06/10/20 2343 06/11/20 0502 06/12/20 0346 06/13/20 0505  WBC 9.3  --  9.7 8.3 9.7  NEUTROABS  --   --  6.1  --   --   HGB 13.9 15.6* 13.9 14.1 14.0  HCT 45.4 46.0 43.8 45.0 45.0  MCV 101.8*  --  99.1 99.3 100.0  PLT 190  --  194 183 287   Basic Metabolic Panel: Recent Labs  Lab 06/10/20 1630  06/10/20 2343 06/11/20 0502 06/12/20 0346 06/13/20 0505  NA 139 144 137 134* 135  K 3.9 3.5 3.4* 4.1 3.8  CL 101  --  99 95* 97*  CO2 29  --  _0 GLUCOSE 113*  --  160* 172* 127*  BUN 13  --  17 18 29*  CREATININE 0.96  --  0.95 1.12* 1.15*  CALCIUM 9.0  --  8.6* 9.0 8.8*  MG  --   --  1.3*  --   --    GFR: Estimated Creatinine Clearance: 78.2 mL/min (A) (by C-G formula based on SCr of 1.15 mg/dL (H)). Liver Function Tests: Recent Labs  Lab 06/11/20 0006 06/11/20 0502  AST 98* 96*  ALT 167* 166*  ALKPHOS 41 48  BILITOT 0.9 1.0  PROT 10.7* 10.1*  ALBUMIN 3.1* 3.1*   No results for input(s): LIPASE, AMYLASE in the last 168 hours. No results for input(s): AMMONIA in the last 168 hours. Coagulation Profile: No results for input(s): INR, PROTIME in the last 168 hours. Cardiac Enzymes: No results for input(s): CKTOTAL, CKMB, CKMBINDEX, TROPONINI in the last 168 hours. BNP (last 3 results) No results for input(s): PROBNP in the last 8760 hours. HbA1C: Recent Labs    06/11/20 0502  HGBA1C 6.4*   CBG: Recent Labs  Lab 06/12/20 1629 06/12/20 2034 06/13/20 0053 06/13/20 0528 06/13/20 0748  GLUCAP 256* 163* 156* 132* 200*   Lipid Profile: No results for input(s): CHOL, HDL, LDLCALC, TRIG, CHOLHDL, LDLDIRECT in the last 72 hours. Thyroid Function Tests: No results for input(s): TSH, T4TOTAL, FREET4, T3FREE, THYROIDAB in the last 72 hours. Anemia Panel: No results for input(s): VITAMINB12, FOLATE, FERRITIN, TIBC, IRON, RETICCTPCT in the last 72 hours. Urine analysis:    Component Value Date/Time   COLORURINE YELLOW 11/12/2019 1450   APPEARANCEUR CLEAR 11/12/2019 1450   LABSPEC 1.011 11/12/2019 1450   PHURINE 5.5 11/12/2019 1450   GLUCOSEU NEGATIVE 11/12/2019 1450   HGBUR NEGATIVE 11/12/2019 1450   BILIRUBINUR NEGATIVE 06/14/2019 0751   KETONESUR NEGATIVE 11/12/2019 1450   PROTEINUR 2+ (A) 11/12/2019 1450   NITRITE NEGATIVE 11/12/2019 1450   LEUKOCYTESUR  NEGATIVE 11/12/2019 1450   Sepsis Labs: _1 (procalcitonin:4,lacticidven:4)  ) Recent Results (from the past 240 hour(s))  SARS Coronavirus 2 by RT PCR (hospital order, performed in [place] hospital lab) Nasopharyngeal Nasopharyngeal Swab     Status: None   Collection Time: 06/10/20 11:34 PM   Specimen: Nasopharyngeal Swab  Result Value Ref Range Status   SARS Coronavirus 2 NEGATIVE NEGATIVE Final    Comment: (NOTE) SARS-CoV-2 target nucleic acids are NOT DETECTED.  The SARS-CoV-2 RNA is generally detectable in upper and lower respiratory specimens during the acute phase of infection. The lowest concentration of SARS-CoV-2 viral copies this assay can detect is 250 copies / mL. A negative result  does not preclude SARS-CoV-2 infection and should not be used as the sole basis for treatment or other patient management decisions.  A negative result may occur with improper specimen collection / handling, submission of specimen other than nasopharyngeal swab, presence of viral mutation(s) within the areas targeted by this assay, and inadequate number of viral copies (<250 copies / mL). A negative result must be combined with clinical observations, patient history, and epidemiological information.  Fact Sheet for Patients:   StrictlyIdeas.no  Fact Sheet for Healthcare Providers: BankingDealers.co.za  This test is not yet approved or  cleared by the [place] FDA and has been authorized for detection and/or diagnosis of SARS-CoV-2 by FDA under an Emergency Use Authorization (EUA).  This EUA will remain in effect (meaning this test can be used) for the duration of the COVID-19 declaration under Section 564(b)(1) of the Act, 21 U.S.C. section 360bbb-3(b)(1), unless the authorization is terminated or revoked sooner.  Performed at [facility], [place] [address] [place], [place] [address]   Culture, blood (routine x 2)  Call MD if unable to obtain prior to antibiotics being given     Status: None (Preliminary result)   Collection Time: 06/11/20  4:52 AM   Specimen: BLOOD  Result Value Ref Range Status   Specimen Description BLOOD LEFT ARM  Final   Special Requests   Final    BOTTLES DRAWN AEROBIC AND ANAEROBIC Blood Culture adequate volume   Culture   Final    NO GROWTH 1 DAY Performed at [facility], [place] [address], [place], [place] [address]    Report Status PENDING  Incomplete  Culture, blood (routine x 2) Call MD if unable to obtain prior to antibiotics being given     Status: None (Preliminary result)   Collection Time: 06/11/20  5:07 AM   Specimen: BLOOD  Result Value Ref Range Status   Specimen Description BLOOD LEFT HAND  Final   Special Requests   Final    BOTTLES DRAWN AEROBIC AND ANAEROBIC Blood Culture adequate volume   Culture   Final    NO GROWTH 1 DAY Performed at [facility], [place] [address] [place], [place] [address]    Report Status PENDING  Incomplete         Radiology Studies: NM Pulmonary Perfusion  Result Date: 06/11/2020 CLINICAL DATA:  Shortness of breath for 3 days. EXAM: NUCLEAR MEDICINE PERFUSION LUNG SCAN TECHNIQUE: Perfusion images were obtained in multiple projections after intravenous injection of radiopharmaceutical. Ventilation scans intentionally deferred if perfusion scan and chest x-ray adequate for interpretation during COVID 19 epidemic. RADIOPHARMACEUTICALS:  4.3 mCi Tc-31mMAA IV COMPARISON:  CT chest today and PA and lateral chest 06/10/2020. FINDINGS: No perfusion defect is seen. IMPRESSION: Negative for pulmonary embolus. Electronically Signed   By: TInge RiseM.D.   On: 06/11/2020 12:07        Scheduled Meds: . DULoxetine  60 mg Oral QHS  . enoxaparin (LOVENOX) injection  75 mg Subcutaneous Daily  . furosemide  60 mg Intravenous BID  . insulin aspart  0-9 Units Subcutaneous TID WC  . insulin aspart protamine- aspart  100  Units Subcutaneous BID WC  . ipratropium-albuterol  3 mL Nebulization BID  . mycophenolate  720 mg Oral BID  . nadolol  40 mg Oral QPM  . pantoprazole  40 mg Oral Daily  . potassium chloride  40 mEq Oral Daily  . predniSONE  12.5 mg Oral Q breakfast  .  zolpidem  5 mg Oral QHS   Continuous Infusions: . sodium chloride Stopped (06/11/20 0603)     LOS: 2 days    Time spent: 70mn    PDomenic Polite MD [facility]  06/13/2020, 11:09 AM

## 2020-06-13 NOTE — Progress Notes (Signed)
RN called earlier d/t pt having questions re: bipap tonight.  I explained procedure to pt prior to placing bipap and answered all questions.  PT currently appears to be tol bipap well, pt states her breathing currently feels comfortable.  I educated pt to take bipap mask off if she fees nauseated or feels like she may vomit.  I placed call bell within reach of pt and discussed call bell use in the event she needs to take bipap mask off.  VSS currently.

## 2020-06-13 NOTE — Progress Notes (Signed)
Occupational Therapy Evaluation Patient Details Name: Monica Ellis MRN: [id number] DOB: 12/05/1961 Today's Date: 06/13/2020    History of Present Illness Patient is a 59 y/o female who presents from PCP due to hypoxia; also with SOB and fatigue. Admitted with acute respiratory failure with hypoxia. CXR-fluid overload. PMH includes SLE, morbid obesity, brain tumor, DM2, HTN, autoimmune hepatitis, COVID 19.   Clinical Impression   PTA, pt lives with mother and reports Modified Independence with ADLs and mobility using Rollator, but had been experiencing increased difficulty completing these tasks. Pt received now on 6 L O2 (did not wear O2 at baseline) with impairments in endurance, strength, and dynamic standing balance. Pt over min guard for basic ADL transfers with RW with O2 at 88% on 6 L O2 during activity. Pt requires min guard to Mod A for LB ADLs at this time. Pt would benefit from further energy conservation strategy education and progression of endurance with ADL mobility as appropriate.     Follow Up Recommendations  Home health OT;Supervision/Assistance - 24 hour    Equipment Recommendations  Other (comment) (pt in need of new Rollator)    Recommendations for Other Services       Precautions / Restrictions Precautions Precautions: Other (comment);Fall Precaution Comments: watch 02, hx of falls Restrictions Weight Bearing Restrictions: No      Mobility Bed Mobility Overal bed mobility: Needs Assistance Bed Mobility: Supine to Sit     Supine to sit: Supervision;HOB elevated     General bed mobility comments: supervision for bed mobility to ensure safety and pacing. Pt able to sit EOB with no trunk support for > 5 minutes   Transfers Overall transfer level: Needs assistance Equipment used: Rolling walker (2 wheeled) Transfers: Sit to/from [facility] Sit to Stand: Supervision Stand pivot transfers: Min guard       General transfer  comment: min guard to maintain steadiness with SPT to BSC with RW. SpO2 dropped to 88% after transfer on 6 L O2.     Balance Overall balance assessment: Needs assistance Sitting-balance support: No upper extremity supported;Feet supported Sitting balance-Leahy Scale: Good Sitting balance - Comments: sitting EOB conversing with no trunk support, no LOB   Standing balance support: Single extremity supported;During functional activity Standing balance-Leahy Scale: Fair Standing balance comment: one handed support on RW during toileting hygiene                           ADL either performed or assessed with clinical judgement   ADL Overall ADL's : Needs assistance/impaired Eating/Feeding: Independent;Sitting   Grooming: Set up;Wash/dry face;Sitting   Upper Body Bathing: Minimal assistance;Sitting   Lower Body Bathing: Moderate assistance;Sit to/from stand   Upper Body Dressing : Set up;Sitting   Lower Body Dressing: Moderate assistance;Sit to/from stand   Toilet Transfer: Min Geophysical data processor Details (indicate cue type and reason): Pt min guard for BSC transfer with decreased use of RW Toileting- Clothing Manipulation and Hygiene: Min guard;Sit to/from stand Toileting - Clothing Manipulation Details (indicate cue type and reason): min guard for toileting hygiene in standing to ensure steadiness        General ADL Comments: Pt with lethargy, decreased cardiopulmonary tolerance (increasing O2 demands/CO2 levels), and decreased overall strength impacting ability to complete ADLs and basic mobility without physical assistance.      Vision Baseline Vision/History: Wears glasses Wears Glasses: Reading only Patient Visual Report: No change from baseline;Other (comment) (hx of failed cataract surgery  to L eye ) Vision Assessment?: No apparent visual deficits     Perception     Praxis Praxis Praxis tested?: Within functional limits    Pertinent  Vitals/Pain Pain Assessment: Faces Faces Pain Scale: Hurts little more Pain Location: head Pain Descriptors / Indicators: Headache Pain Intervention(s): RN gave pain meds during session;Monitored during session     Hand Dominance Right   Extremity/Trunk Assessment Upper Extremity Assessment Upper Extremity Assessment: Generalized weakness   Lower Extremity Assessment Lower Extremity Assessment: Defer to PT evaluation   Cervical / Trunk Assessment Cervical / Trunk Assessment: Normal   Communication Communication Communication: No difficulties   Cognition Arousal/Alertness: Awake/alert;Lethargic Behavior During Therapy: WFL for tasks assessed/performed Overall Cognitive Status: Impaired/Different from baseline Area of Impairment: Safety/judgement                         Safety/Judgement: Decreased awareness of deficits     General Comments: Decreased awareness of need for O2, wanted to remove for toileting task. Pt with slow initiation and processing   General Comments  Pt received on 6 L O2, stats at 90% at rest. Pt removed O2 for bed mobility , but SpO2 dropped to 85% on RA when conversing with therapist. After education, pt agreeable to use O2 for toileting. Pt decreased to 88% at lowest on 6 L O2, recovered within 45 seconds to 93%. Reinforced use of incentive spirometer with pt able to demo 1061m    Exercises     Shoulder Instructions      Home Living Family/patient expects to be discharged to:: Private residence Living Arrangements: Parent Available Help at Discharge: Family;Available PRN/intermittently Type of Home: House Home Access: Stairs to enter ECenterPoint Energyof Steps: 5 Entrance Stairs-Rails: Left Home Layout: One level     Bathroom Shower/Tub: TTeacher, early years/pre Standard     Home Equipment: WEnvironmental consultant- 4 wheels;Cane - single point;Shower seat;Grab bars - tub/shower (reports her 4WW broken, has been using her  mother's)          Prior Functioning/Environment Level of Independence: Independent with assistive device(s);Needs assistance  Gait / Transfers Assistance Needed: Uses rollator PTA. Has been gettimg HHPT. Fell 2 weeks ago. ADL's / Homemaking Assistance Needed: Independent with ADLs. Trouble showering due to SOB.            OT Problem List: Decreased strength;Decreased activity tolerance;Impaired balance (sitting and/or standing);Cardiopulmonary status limiting activity      OT Treatment/Interventions: Self-care/ADL training;Therapeutic exercise;Energy conservation;DME and/or AE instruction;Therapeutic activities;Patient/family education    OT Goals(Current goals can be found in the care plan section) Acute Rehab OT Goals Patient Stated Goal: decrease O2 needs, improve endurance OT Goal Formulation: With patient Time For Goal Achievement: 06/27/20 Potential to Achieve Goals: Good ADL Goals Pt Will Perform Grooming: with supervision;standing Pt Will Perform Lower Body Bathing: with min guard assist;sit to/from stand Pt Will Perform Lower Body Dressing: with min guard assist;sit to/from stand Pt Will Transfer to Toilet: with set-up;stand pivot transfer;bedside commode Pt Will Perform Toileting - Clothing Manipulation and hygiene: with modified independence;sit to/from stand Additional ADL Goal #1: Pt to verbalize at least 3 energy conservation strategies to implement during daily tasks to maximize independence.  OT Frequency: Min 2X/week   Barriers to D/C:            Co-evaluation              AM-PAC OT "6 Clicks" Daily Activity  Outcome Measure Help from another person eating meals?: None Help from another person taking care of personal grooming?: A Little Help from another person toileting, which includes using toliet, bedpan, or urinal?: A Little Help from another person bathing (including washing, rinsing, drying)?: A Lot Help from another person to put on and  taking off regular upper body clothing?: A Little Help from another person to put on and taking off regular lower body clothing?: A Lot 6 Click Score: 17   End of Session Equipment Utilized During Treatment: Rolling walker;Oxygen Nurse Communication: Mobility status;Other (comment) (O2 stats)  Activity Tolerance: Patient tolerated treatment well;Patient limited by lethargy Patient left: in bed;with call bell/phone within reach;with nursing/sitter in room  OT Visit Diagnosis: Unsteadiness on feet (R26.81);Other abnormalities of gait and mobility (R26.89);Muscle weakness (generalized) (M62.81);History of falling (Z91.81)                Time: 1779-3903 OT Time Calculation (min): 28 min Charges:  OT General Charges $OT Visit: 1 Visit OT Evaluation $OT Eval Moderate Complexity: 1 Mod OT Treatments $Self Care/Home Management : 8-22 mins  Layla Maw, OTR/L  Layla Maw 06/13/2020, 2:31 PM

## 2020-06-13 NOTE — Progress Notes (Signed)
Results for Monica Ellis, Monica Ellis (MRN [id number]) as of 06/13/2020 11:46  Ref. Range 06/13/2020 10:57  Sample type Unknown ARTERIAL DRAW  FIO2 Unknown 44.00  pH, Arterial Latest Ref Range: 7.35 - 7.45  7.337 (L)  pCO2 arterial Latest Ref Range: 32 - 48 mmHg 68.8 (HH)  pO2, Arterial Latest Ref Range: 83 - 108 mmHg 71.0 (L)  Acid-Base Excess Latest Ref Range: 0.0 - 2.0 mmol/L 9.9 (H)  Bicarbonate Latest Ref Range: 20.0 - 28.0 mmol/L 35.8 (H)  O2 Saturation Latest Units: % 92.8  Patient temperature Unknown 37.0  Collection site Unknown RIGHT RADIAL  Allens test (pass/fail) Latest Ref Range: PASS  PASS    Critical CO2 68.8 was called to doctor joseph. Transfer orders were put in due to patient needing Bipap . Patient updated.

## 2020-06-13 NOTE — Progress Notes (Signed)
Patient continues to exhibit signs of hypercapnia associated with morbid obesity that is causing thoracic restriction.  Interruption or failure to provide NIV would quickly lead to exacerbation of the patient's condition, hospital admission, and likely harm to the patient. Continued use is preferred.  The use of the NIV will treat patient's high PC02 levels and can reduce risk of exacerbations and future hospitalizations when used at night and during the day.  BiLevel/RAD has been considered and ruled out as patient requires continuous alarms, backup battery, and portability which are not possible with BiLevel/RAD devices.  Ventilation is required to decrease the work of breathing and improve pulmonary status. Interruption of ventilator support would lead to decline of health status.  Patient is able to protect their airways and clear secretions on their own.

## 2020-06-13 NOTE — TOC Initial Note (Addendum)
Transition of Care [facility]) - Initial/Assessment Note    Patient Details  Name: Monica Ellis MRN: [id number] Date of Birth: [birth year]  Transition of [facility]) CM/SW Contact:    Bartholomew Crews, RN Phone Number: [phone] 06/13/2020, 3:22 PM  Clinical Narrative:                  Spoke with patient at the bedside. PTA home with mom. States that a neighbor is checking in on mom, but that mom gets around physically better than she does. Has a rollator at home that needs to be replaced, and she has already been in contact with DME company who stated that they will replace, but will need the broken device. Encouraged her to follow up with DME company who provided rollator for replacement.   Discussed transfer to progressive floor d/t need for bipap. Advised of potential need to continue use at discharge. Patient agreeable to referral to [facility] for NIV. Discussed need for insurance approval prior to discharge - insurance approval will likely be at least 3 business days. MD aware. UPDATE: Order form signed by MD and emailed to [place]. Original order placed on shadow chart.   Discussed recommendations for home health. Patient is already active with [facility] for RN and PT. Patient will need [place] orders for RN and PT at discharge in order to resume services. Confirmed active with Tiffany at [place] [facility].   Patient states that she is also on a list for home care assistance. States that she doesn't qualify for Medicaid for this assistance.   TOC following for transition needs.   Expected Discharge Plan: [place] Barriers to Discharge: Continued Medical Work up   Patient Goals and CMS Choice Patient states their goals for this hospitalization and ongoing recovery are:: return home with mom CMS Medicare.gov Compare Post Acute Care list provided to:: Patient Choice offered to / list presented to : Patient  Expected Discharge Plan and Services Expected Discharge  Plan: [place] In-house Referral: NA Discharge Planning Services: CM Consult Post Acute Care Choice: Durable Medical Equipment, Home Health Living arrangements for the past 2 months: Single Family Home                 DME Arranged: NIV DME Agency: [facility] Date DME Agency Contacted: 06/13/20 Time DME Agency Contacted: 1520 Representative spoke with at DME Agency: Thedore Mins HH Arranged: RN, PT [place] Agency: [facility] (formerly [employer]) Date [place]: 06/13/20 Time [place]: 65 Representative spoke with at [place]: [place]  Prior Living Arrangements/Services Living arrangements for the past 2 months: [place] with:: Self, Parents Patient language and need for interpreter reviewed:: Yes        Need for Family Participation in Patient Care: Yes (Comment) Care giver support system in place?: Yes (comment) Current home services: DME (rollator (broken); cane) Criminal Activity/Legal Involvement Pertinent to Current Situation/Hospitalization: No - Comment as needed  Activities of Daily Living Home Assistive Devices/Equipment: CBG Meter, Cane (specify quad or straight), Walker (specify type) ADL Screening (condition at time of admission) Patient's cognitive ability adequate to safely complete daily activities?: Yes Is the patient deaf or have difficulty hearing?: No Does the patient have difficulty seeing, even when wearing glasses/contacts?: No Does the patient have difficulty concentrating, remembering, or making decisions?: No Patient able to express need for assistance with ADLs?: Yes Does the patient have difficulty dressing or bathing?: Yes Independently  performs ADLs?: Yes (appropriate for developmental age) Does the patient have difficulty walking or climbing stairs?: Yes Weakness of Legs: Both Weakness of Arms/Hands: None  Permission Sought/Granted                  Emotional  Assessment Appearance:: Appears stated age Attitude/Demeanor/Rapport: Engaged Affect (typically observed): Accepting Orientation: : Oriented to Self, Oriented to  Time, Oriented to Place, Oriented to Situation Alcohol / Substance Use: Tobacco Use Psych Involvement: No (comment)  Admission diagnosis:  Bronchitis [J40] Hypoxia [R09.02] Acute respiratory failure with hypoxia ([employer]) [J96.01] Patient Active Problem List   Diagnosis Date Noted  . Nicotine dependence, cigarettes, uncomplicated 88/50/2774  . Acute respiratory failure with hypoxia ([place]) 06/10/2020  . Nausea vomiting and diarrhea 06/14/2019  . AKI (acute kidney injury) ([place]) 06/14/2019  . CAP (community acquired pneumonia) 02/13/2018  . Dizziness 02/12/2018  . Autoimmune hepatitis ([place]) 11/18/2017  . Essential hypertension 11/18/2017  . Class 3 severe obesity due to excess calories with serious comorbidity and body mass index (BMI) of 40.0 to 44.9 in adult ([place]) 11/18/2017  . History of depression 11/18/2017  . GERD without esophagitis 11/18/2017  . Dyslipidemia 11/18/2017  . Vitamin D deficiency 11/18/2017  . History of migraine 11/18/2017  . History of meningioma 11/18/2017  . DDD (degenerative disc disease), cervical 11/18/2017  . DDD (degenerative disc disease), lumbar 11/18/2017  . Acute diverticulitis 06/07/2017  . Hypokalemia 06/07/2017  . Hypomagnesemia 06/07/2017  . Tobacco use disorder 06/07/2017  . Atelectasis of both lungs 06/07/2017  . Acute maxillary sinusitis 10/13/2016  . Abnormal LFTs   . Pain in the chest   . Controlled type 2 diabetes mellitus with diabetic polyneuropathy, with long-term current use of insulin ([place])   . Hypoxia 12/29/2015  . Uncontrolled type 2 diabetes mellitus ([place]) 12/29/2015  . Dyspnea 12/29/2015  . LLQ pain 12/29/2015  . Lupus ([place]) 12/29/2015  . Chest pain 12/29/2015  . Transaminitis 12/29/2015  . Chronic pain 12/29/2015  . Depression 12/29/2015   PCP:  Ferd Hibbs,  NP Pharmacy:   [facility], [address] [place] [address] Phone: [phone] Fax: [phone]     Social Determinants of Health (SDOH) Interventions    Readmission Risk Interventions No flowsheet data found.

## 2020-06-14 LAB — CBC
HCT: 44.5 % (ref 36.0–46.0)
Hemoglobin: 13.9 g/dL (ref 12.0–15.0)
MCH: 31.4 pg (ref 26.0–34.0)
MCHC: 31.2 g/dL (ref 30.0–36.0)
MCV: 100.5 fL — ABNORMAL HIGH (ref 80.0–100.0)
Platelets: 182 10*3/uL (ref 150–400)
RBC: 4.43 MIL/uL (ref 3.87–5.11)
RDW: 15.1 % (ref 11.5–15.5)
WBC: 8.1 10*3/uL (ref 4.0–10.5)
nRBC: 0 % (ref 0.0–0.2)

## 2020-06-14 LAB — RNA QUALITATIVE: HIV 1 RNA Qualitative: 1

## 2020-06-14 LAB — BASIC METABOLIC PANEL
Anion gap: 8 (ref 5–15)
BUN: 34 mg/dL — ABNORMAL HIGH (ref 8–23)
CO2: 31 mmol/L (ref 22–32)
Calcium: 8.7 mg/dL — ABNORMAL LOW (ref 8.9–10.3)
Chloride: 97 mmol/L — ABNORMAL LOW (ref 98–111)
Creatinine, Ser: 1.14 mg/dL — ABNORMAL HIGH (ref 0.44–1.00)
GFR calc Af Amer: >60 mL/min (ref >60)
GFR calc non Af Amer: 52 mL/min — ABNORMAL LOW (ref >60)
Glucose, Bld: 114 mg/dL — ABNORMAL HIGH (ref 70–99)
Potassium: 3.7 mmol/L (ref 3.5–5.1)
Sodium: 136 mmol/L (ref 135–145)

## 2020-06-14 LAB — GLUCOSE, CAPILLARY
Glucose-Capillary: 131 mg/dL — ABNORMAL HIGH (ref 70–99)
Glucose-Capillary: 195 mg/dL — ABNORMAL HIGH (ref 70–99)
Glucose-Capillary: 160 mg/dL — ABNORMAL HIGH (ref 70–99)
Glucose-Capillary: 239 mg/dL — ABNORMAL HIGH (ref 70–99)
Glucose-Capillary: 169 mg/dL — ABNORMAL HIGH (ref 70–99)

## 2020-06-14 LAB — HIV-1/2 AB - DIFFERENTIATION
HIV 1 Ab: NEGATIVE
HIV 2 Ab: NEGATIVE
Note: NEGATIVE

## 2020-06-14 MED ORDER — FUROSEMIDE 10 MG/ML IJ SOLN
40.0000 mg | Freq: Two times a day (BID) | INTRAMUSCULAR | Status: DC
  Administered 2020-06-14 – 2020-06-15 (×2): 40 mg via INTRAVENOUS
  Filled 2020-06-14 (×2): qty 4

## 2020-06-14 MED ORDER — OXYCODONE HCL 5 MG PO TABS
5.0000 mg | ORAL_TABLET | Freq: Four times a day (QID) | ORAL | Status: DC | PRN
  Administered 2020-06-14 – 2020-06-17 (×9): 5 mg via ORAL
  Filled 2020-06-14 (×9): qty 1

## 2020-06-14 MED ORDER — METOCLOPRAMIDE HCL 5 MG/ML IJ SOLN
10.0000 mg | Freq: Once | INTRAMUSCULAR | Status: AC | PRN
  Administered 2020-06-14: 10 mg via INTRAVENOUS
  Filled 2020-06-14: qty 2

## 2020-06-14 MED ORDER — KETOROLAC TROMETHAMINE 15 MG/ML IJ SOLN
15.0000 mg | Freq: Once | INTRAMUSCULAR | Status: AC | PRN
  Administered 2020-06-14: 15 mg via INTRAVENOUS
  Filled 2020-06-14: qty 1

## 2020-06-14 MED ORDER — INSULIN ASPART PROT & ASPART (70-30 MIX) 100 UNIT/ML ~~LOC~~ SUSP
110.0000 [IU] | Freq: Two times a day (BID) | SUBCUTANEOUS | Status: DC
  Administered 2020-06-14 – 2020-06-15 (×2): 110 [IU] via SUBCUTANEOUS
  Filled 2020-06-14: qty 10

## 2020-06-14 NOTE — Progress Notes (Signed)
PROGRESS NOTE    Monica Ellis  WJX:[id number] DOB: 1961/10/04 DOA: 06/10/2020 PCP: Ferd Hibbs, NP  Brief Narrative: Monica Ellis is a 59 year old chronically ill female with past history of SLE, autoimmune hepatitis on chronic prednisone, liver cirrhosis, morbid obesity, BMI of 55, type 2 diabetes mellitus, long-term tobacco use presented to the emergency room 7/6 evening with worsening dyspnea over 3 days, previously reported worsening of fatigue and dyspnea for a year ever since she had Covid in 2020. -Chest x-ray notable for mild interstitial edema, hypoxic O2 sats of 81% at PCPs office  and was sent to the ED. -CT chest noted basilar atelectasis without acute findings -VQ scan negative for PE, 2D echo normal EF, moderate LVH, no diastolic dysfunction -7/9 noted to be slightly more lethargic, ABG noted mild respiratory acidosis, started on BiPAP   Assessment & Plan:  Acute on chronic hypoxic respiratory failure -Multifactorial, a component of fluid overload due to underlying cirrhosis -Also suspect underlying OSA/OHS and component of chronic lung disease/COPD from long-term tobacco abuse -2D echo with preserved EF, moderate LVH, no diastolic dysfunction noted, VQ scan negative for PE -Continue IV Lasix today -She is 3.4 L negative -7/9 was noted to be more lethargic, ABG noted mild respiratory acidosis, started on BiPAP in the afternoon and at bedtime  -Cutdown home dose of oxycodone -Ambulate, PT eval -Needs outpatient sleep study and pulmonary follow-up  Positive HIV screen -Follow-up confirmatory test  SLE Autoimmune hepatitis Liver cirrhosis -Followed by [facility] -Continued on home regimen of prednisone 12.5 mg daily, goal is to wean this down as tolerated  Type 2 diabetes mellitus -hemoglobin A1c is 6.4, increase continue insulin 70/30 to 110 units twice daily, closer to home regimen and change sliding scale insulin to sensitive  Tobacco abuse   -Counseled -Needs PFTs to determine if she has COPD  Super morbid obesity -BMI is 55.5 -Needs diet/lifestyle modification and eventually tapered off prednisone  DVT prophylaxis: Lovenox- Code Status: Full code Family Communication: No family at bedside Disposition Plan:  Status is: Inpatient  Remains inpatient appropriate because:Inpatient level of care appropriate due to severity of illness   Dispo: The patient is from: Home              Anticipated d/c is to: Home              Anticipated d/c date is: 2-3 days              Patient currently is not medically stable to d/c.  Procedures:   Antimicrobials:    Subjective: -Complains of headache, tolerated BiPAP last night, breathing slightly better  Objective: Vitals:   06/14/20 0508 06/14/20 0509 06/14/20 0755 06/14/20 1159  BP: 128/70   110/62  Pulse: 66   88  Resp: 15   16  Temp: 97.7 F (36.5 C)   98 F (36.7 C)  TempSrc: Axillary   Oral  SpO2: 92%  93% 91%  Weight:  (!) 151.7 kg    Height:        Intake/Output Summary (Last 24 hours) at 06/14/2020 1356 Last data filed at 06/14/2020 0500 Gross per 24 hour  Intake 770 ml  Output 1125 ml  Net -355 ml   Filed Weights   06/11/20 0153 06/12/20 2040 06/14/20 0509  Weight: (!) 153.7 kg (!) 153.7 kg (!) 151.7 kg    Examination:  General exam: Morbidly obese chronically ill female sitting up in bed, more alert and interactive, oriented x3 HEENT: Neck  obese unable to assess JVD CVS: S1-S2, regular rate rhythm Lungs: Distant breath sounds, few basilar rales Abdomen: Soft, nontender, bowel sounds present Extremities: No edema  Neuro : Slightly more alert and interactive, oriented x3, no focal deficits  Skin: No rashes on exposed skin Psychiatry:  Mood & affect appropriate.     Data Reviewed:   CBC: Recent Labs  Lab 06/10/20 1630 06/10/20 1630 06/10/20 2343 06/11/20 0502 06/12/20 0346 06/13/20 0505 06/14/20 0320  WBC 9.3  --   --  9.7 8.3 9.7  8.1  NEUTROABS  --   --   --  6.1  --   --   --   HGB 13.9   < > 15.6* 13.9 14.1 14.0 13.9  HCT 45.4   < > 46.0 43.8 45.0 45.0 44.5  MCV 101.8*  --   --  99.1 99.3 100.0 100.5*  PLT 190  --   --  194 183 184 182   < > = values in this interval not displayed.   Basic Metabolic Panel: Recent Labs  Lab 06/10/20 1630 06/10/20 1630 06/10/20 2343 06/11/20 0502 06/12/20 0346 06/13/20 0505 06/14/20 0320  NA 139   < > 144 137 134* 135 136  K 3.9   < > 3.5 3.4* 4.1 3.8 3.7  CL 101  --   --  99 95* 97* 97*  CO2 29  --   --  _0 GLUCOSE 113*  --   --  160* 172* 127* 114*  BUN 13  --   --  17 18 29* 34*  CREATININE 0.96  --   --  0.95 1.12* 1.15* 1.14*  CALCIUM 9.0  --   --  8.6* 9.0 8.8* 8.7*  MG  --   --   --  1.3*  --   --   --    < > = values in this interval not displayed.   GFR: Estimated Creatinine Clearance: 78.2 mL/min (A) (by C-G formula based on SCr of 1.14 mg/dL (H)). Liver Function Tests: Recent Labs  Lab 06/11/20 0006 06/11/20 0502  AST 98* 96*  ALT 167* 166*  ALKPHOS 41 48  BILITOT 0.9 1.0  PROT 10.7* 10.1*  ALBUMIN 3.1* 3.1*   No results for input(s): LIPASE, AMYLASE in the last 168 hours. No results for input(s): AMMONIA in the last 168 hours. Coagulation Profile: No results for input(s): INR, PROTIME in the last 168 hours. Cardiac Enzymes: No results for input(s): CKTOTAL, CKMB, CKMBINDEX, TROPONINI in the last 168 hours. BNP (last 3 results) No results for input(s): PROBNP in the last 8760 hours. HbA1C: No results for input(s): HGBA1C in the last 72 hours. CBG: Recent Labs  Lab 06/13/20 1634 06/13/20 2156 06/14/20 0559 06/14/20 0753 06/14/20 1156  GLUCAP 231* 124* 131* 195* 160*   Lipid Profile: No results for input(s): CHOL, HDL, LDLCALC, TRIG, CHOLHDL, LDLDIRECT in the last 72 hours. Thyroid Function Tests: No results for input(s): TSH, T4TOTAL, FREET4, T3FREE, THYROIDAB in the last 72 hours. Anemia Panel: No results for input(s):  VITAMINB12, FOLATE, FERRITIN, TIBC, IRON, RETICCTPCT in the last 72 hours. Urine analysis:    Component Value Date/Time   COLORURINE YELLOW 11/12/2019 1450   APPEARANCEUR CLEAR 11/12/2019 1450   LABSPEC 1.011 11/12/2019 1450   PHURINE 5.5 11/12/2019 1450   GLUCOSEU NEGATIVE 11/12/2019 1450   HGBUR NEGATIVE 11/12/2019 1450   BILIRUBINUR NEGATIVE 06/14/2019 0751   KETONESUR NEGATIVE 11/12/2019 1450   PROTEINUR 2+ (A) 11/12/2019  [place] 11/12/2019 1450   LEUKOCYTESUR NEGATIVE 11/12/2019 1450   Sepsis Labs: _0 (procalcitonin:4,lacticidven:4)  ) Recent Results (from the past 240 hour(s))  SARS Coronavirus 2 by RT PCR (hospital order, performed in [facility] hospital lab) Nasopharyngeal Nasopharyngeal Swab     Status: None   Collection Time: 06/10/20 11:34 PM   Specimen: Nasopharyngeal Swab  Result Value Ref Range Status   SARS Coronavirus 2 NEGATIVE NEGATIVE Final    Comment: (NOTE) SARS-CoV-2 target nucleic acids are NOT DETECTED.  The SARS-CoV-2 RNA is generally detectable in upper and lower respiratory specimens during the acute phase of infection. The lowest concentration of SARS-CoV-2 viral copies this assay can detect is 250 copies / mL. A negative result does not preclude SARS-CoV-2 infection and should not be used as the sole basis for treatment or other patient management decisions.  A negative result may occur with improper specimen collection / handling, submission of specimen other than nasopharyngeal swab, presence of viral mutation(s) within the areas targeted by this assay, and inadequate number of viral copies (<250 copies / mL). A negative result must be combined with clinical observations, patient history, and epidemiological information.  Fact Sheet for Patients:   StrictlyIdeas.no  Fact Sheet for Healthcare Providers: BankingDealers.co.za  This test is not yet approved or  cleared by  the [place] FDA and has been authorized for detection and/or diagnosis of SARS-CoV-2 by FDA under an Emergency Use Authorization (EUA).  This EUA will remain in effect (meaning this test can be used) for the duration of the COVID-19 declaration under Section 564(b)(1) of the Act, 21 U.S.C. section 360bbb-3(b)(1), unless the authorization is terminated or revoked sooner.  Performed at [facility], [place] [address] [place], [place] [address]   Culture, blood (routine x 2) Call MD if unable to obtain prior to antibiotics being given     Status: None (Preliminary result)   Collection Time: 06/11/20  4:52 AM   Specimen: BLOOD  Result Value Ref Range Status   Specimen Description BLOOD LEFT ARM  Final   Special Requests   Final    BOTTLES DRAWN AEROBIC AND ANAEROBIC Blood Culture adequate volume   Culture   Final    NO GROWTH 2 DAYS Performed at [facility], [place] [address], [place], [place] [address]    Report Status PENDING  Incomplete  Culture, blood (routine x 2) Call MD if unable to obtain prior to antibiotics being given     Status: None (Preliminary result)   Collection Time: 06/11/20  5:07 AM   Specimen: BLOOD  Result Value Ref Range Status   Specimen Description BLOOD LEFT HAND  Final   Special Requests   Final    BOTTLES DRAWN AEROBIC AND ANAEROBIC Blood Culture adequate volume   Culture   Final    NO GROWTH 2 DAYS Performed at [facility], [place] [address], [place], [place] [address]    Report Status PENDING  Incomplete         Radiology Studies: No results found.      Scheduled Meds: . DULoxetine  60 mg Oral QHS  . enoxaparin (LOVENOX) injection  75 mg Subcutaneous Daily  . furosemide  40 mg Intravenous BID  . insulin aspart  0-9 Units Subcutaneous TID WC  . insulin aspart protamine- aspart  110 Units Subcutaneous BID WC  . ipratropium-albuterol  3 mL Nebulization BID  . mycophenolate  720 mg Oral BID  . nadolol  40 mg Oral QPM  .  potassium chloride  40 mEq Oral Daily  . predniSONE  12.5 mg Oral Q breakfast  . zolpidem  5 mg Oral QHS   Continuous Infusions: . sodium chloride Stopped (06/11/20 0603)     LOS: 3 days    Time spent: 23mn    PDomenic Polite MD [facility]  06/14/2020, 1:56 PM

## 2020-06-14 NOTE — Progress Notes (Signed)
Physical Therapy Treatment Patient Details Name: Monica Ellis MRN: [id number] DOB: [birth year] Today's Date: 06/14/2020    History of Present Illness Patient is a 59 y/o female who presents from PCP due to hypoxia; also with SOB and fatigue. Admitted with acute respiratory failure with hypoxia. CXR-fluid overload. PMH includes SLE, morbid obesity, brain tumor, DM2, HTN, autoimmune hepatitis, COVID 19.    PT Comments    Pt pleasant and reports feeling defeated with addition of more medical issues and "like an alien" currently wearing bipap. Active listening and reassurance provided that bipap is temporary. Pt able to perform repeated transfers and standing marching on bipap with SpO2 92-94% with HR 74. Pt educated for HEP, continued transfers and progression as pulmonary status allows.   BP 146/87 (102)    Follow Up Recommendations  Home health PT;Supervision for mobility/OOB     Equipment Recommendations  None recommended by PT    Recommendations for Other Services       Precautions / Restrictions Precautions Precautions: Other (comment);Fall Precaution Comments: watch 02    Mobility  Bed Mobility Overal bed mobility: Modified Independent Bed Mobility: Supine to Sit           General bed mobility comments: HOB 35 degrees with increased time pt able to pivot to right with use of rail, supervision for lines only  Transfers Overall transfer level: Needs assistance   Transfers: Sit to/from Stand;Stand Pivot Transfers Sit to Stand: Supervision Stand pivot transfers: Min guard       General transfer comment: pt stood from bed x 3 with additional stand pivot bed<>BSC with guarding for lines and safety. Pt able to perform pivot transfer with single UE support on surface and required RW support for repeated standing  Ambulation/Gait             General Gait Details: unable due to bipap   Stairs             Wheelchair Mobility    Modified Rankin  (Stroke Patients Only)       Balance Overall balance assessment: Needs assistance   Sitting balance-Leahy Scale: Good     Standing balance support: Single extremity supported;Bilateral upper extremity supported Standing balance-Leahy Scale: Poor Standing balance comment: UE support during standing                            Cognition Arousal/Alertness: Awake/alert Behavior During Therapy: WFL for tasks assessed/performed Overall Cognitive Status: Within Functional Limits for tasks assessed                                        Exercises General Exercises - Lower Extremity Long Arc Quad: AROM;Both;Seated;10 reps Hip Flexion/Marching: AROM;Both;Seated;10 reps Other Exercises Other Exercises: standing marching in RW x 5 reps each leg x 3 sets    General Comments        Pertinent Vitals/Pain Pain Assessment: No/denies pain    Home Living                      Prior Function            PT Goals (current goals can now be found in the care plan section) Progress towards PT goals: Progressing toward goals    Frequency           PT Plan Current plan remains  appropriate    Co-evaluation              AM-PAC PT "6 Clicks" Mobility   Outcome Measure  Help needed turning from your back to your side while in a flat bed without using bedrails?: None Help needed moving from lying on your back to sitting on the side of a flat bed without using bedrails?: None Help needed moving to and from a bed to a chair (including a wheelchair)?: A Little Help needed standing up from a chair using your arms (e.g., wheelchair or bedside chair)?: A Little Help needed to walk in hospital room?: A Little Help needed climbing 3-5 steps with a railing? : A Lot 6 Click Score: 19    End of Session Equipment Utilized During Treatment: Oxygen Activity Tolerance: Patient tolerated treatment well Patient left: in bed;with call bell/phone within  reach Nurse Communication: Mobility status PT Visit Diagnosis: Muscle weakness (generalized) (M62.81);Other abnormalities of gait and mobility (R26.89)     Time: 7218-2883 PT Time Calculation (min) (ACUTE ONLY): 29 min  Charges:  $Therapeutic Exercise: 8-22 mins $Therapeutic Activity: 8-22 mins                     Kadey Mihalic P, PT Acute Rehabilitation Services Pager: [phone] Office: [place] [facility] 06/14/2020, 1:18 PM

## 2020-06-15 LAB — COMPREHENSIVE METABOLIC PANEL
ALT: 170 U/L — ABNORMAL HIGH (ref 0–44)
AST: 105 U/L — ABNORMAL HIGH (ref 15–41)
Albumin: 2.9 g/dL — ABNORMAL LOW (ref 3.5–5.0)
Alkaline Phosphatase: 40 U/L (ref 38–126)
Anion gap: 8 (ref 5–15)
BUN: 31 mg/dL — ABNORMAL HIGH (ref 8–23)
CO2: 32 mmol/L (ref 22–32)
Calcium: 8.6 mg/dL — ABNORMAL LOW (ref 8.9–10.3)
Chloride: 96 mmol/L — ABNORMAL LOW (ref 98–111)
Creatinine, Ser: 1.03 mg/dL — ABNORMAL HIGH (ref 0.44–1.00)
GFR calc Af Amer: >60 mL/min (ref >60)
GFR calc non Af Amer: 59 mL/min — ABNORMAL LOW (ref >60)
Glucose, Bld: 185 mg/dL — ABNORMAL HIGH (ref 70–99)
Potassium: 3.3 mmol/L — ABNORMAL LOW (ref 3.5–5.1)
Sodium: 136 mmol/L (ref 135–145)
Total Bilirubin: 0.8 mg/dL (ref 0.3–1.2)
Total Protein: 10.5 g/dL — ABNORMAL HIGH (ref 6.5–8.1)

## 2020-06-15 LAB — CBC
HCT: 42.8 % (ref 36.0–46.0)
Hemoglobin: 13.4 g/dL (ref 12.0–15.0)
MCH: 31.1 pg (ref 26.0–34.0)
MCHC: 31.3 g/dL (ref 30.0–36.0)
MCV: 99.3 fL (ref 80.0–100.0)
Platelets: 179 10*3/uL (ref 150–400)
RBC: 4.31 MIL/uL (ref 3.87–5.11)
RDW: 15 % (ref 11.5–15.5)
WBC: 7.3 10*3/uL (ref 4.0–10.5)
nRBC: 0 % (ref 0.0–0.2)

## 2020-06-15 LAB — GLUCOSE, CAPILLARY
Glucose-Capillary: 110 mg/dL — ABNORMAL HIGH (ref 70–99)
Glucose-Capillary: 114 mg/dL — ABNORMAL HIGH (ref 70–99)
Glucose-Capillary: 185 mg/dL — ABNORMAL HIGH (ref 70–99)
Glucose-Capillary: 215 mg/dL — ABNORMAL HIGH (ref 70–99)
Glucose-Capillary: 79 mg/dL (ref 70–99)

## 2020-06-15 MED ORDER — POTASSIUM CHLORIDE CRYS ER 20 MEQ PO TBCR
40.0000 meq | EXTENDED_RELEASE_TABLET | Freq: Once | ORAL | Status: AC
  Administered 2020-06-15: 40 meq via ORAL

## 2020-06-15 MED ORDER — INSULIN ASPART PROT & ASPART (70-30 MIX) 100 UNIT/ML ~~LOC~~ SUSP
125.0000 [IU] | Freq: Two times a day (BID) | SUBCUTANEOUS | Status: DC
  Administered 2020-06-15: 110 [IU] via SUBCUTANEOUS

## 2020-06-15 MED ORDER — FUROSEMIDE 10 MG/ML IJ SOLN
60.0000 mg | Freq: Two times a day (BID) | INTRAMUSCULAR | Status: DC
  Administered 2020-06-15 – 2020-06-16 (×2): 60 mg via INTRAVENOUS
  Filled 2020-06-15 (×2): qty 6

## 2020-06-15 NOTE — Progress Notes (Signed)
PROGRESS NOTE    Monica Ellis  RRM:[id number] DOB: 1961/01/06 DOA: 06/10/2020 PCP: Ferd Hibbs, NP  Brief Narrative: Monica Ellis is a 59 year old chronically ill female with past history of SLE, autoimmune hepatitis on chronic prednisone, liver cirrhosis, morbid obesity, BMI of 55, type 2 diabetes mellitus, long-term tobacco use presented to the emergency room 7/6 evening with worsening dyspnea over 3 days, previously reported worsening of fatigue and dyspnea for a year ever since she had Covid in 2020. -Chest x-ray notable for mild interstitial edema, hypoxic O2 sats of 81% at PCPs office  and was sent to the ED. -CT chest noted basilar atelectasis without acute findings -VQ scan negative for PE, 2D echo normal EF, moderate LVH, no diastolic dysfunction -7/9 noted to be slightly more lethargic, ABG noted mild respiratory acidosis, started on BiPAP   Assessment & Plan:  Acute on chronic hypoxic respiratory failure -Multifactorial, a component of fluid overload due to underlying cirrhosis -Also suspect underlying OSA/OHS and component of chronic lung disease/COPD from long-term tobacco abuse -2D echo with preserved EF, moderate LVH, no diastolic dysfunction noted, VQ scan negative for PE -Continue IV Lasix today -She is 3.2 L negative -7/9 was noted to be more lethargic, ABG noted mild respiratory acidosis, started on BiPAP in the afternoon and at bedtime  -Cutdown home dose of oxycodone -Ambulate, PT eval -Needs outpatient sleep study and pulmonary follow-up -DC planning  Positive HIV screen -HIV 1 and 2 Ab are negative, viral load is negative  SLE Autoimmune hepatitis Liver cirrhosis -Followed by [facility] -Continued on home regimen of prednisone 12.5 mg daily, goal is to wean this down as tolerated  Elevated total protein -check SPEP -likely due to autoimmune hepatitis  Type 2 diabetes mellitus -hemoglobin A1c is 6.4, increase continue insulin 70/30 to  110 units twice daily, closer to home regimen and change sliding scale insulin to sensitive  Tobacco abuse  -Counseled -Needs PFTs to determine if she has COPD  Super morbid obesity -BMI is 55.5 -Needs diet/lifestyle modification and eventually tapered off prednisone  DVT prophylaxis: Lovenox- Code Status: Full code Family Communication: No family at bedside Disposition Plan:  Status is: Inpatient  Remains inpatient appropriate because:Inpatient level of care appropriate due to severity of illness   Dispo: The patient is from: Home              Anticipated d/c is to: Home              Anticipated d/c date is: 1-2days              Patient currently is not medically stable to d/c.  Procedures:   Antimicrobials:    Subjective: -Doing a little better today, tolerated BiPAP last night, headache improving  Objective: Vitals:   06/15/20 0757 06/15/20 0813 06/15/20 1212 06/15/20 1337  BP: 140/73  129/81 124/63  Pulse: 67 72 66 73  Resp: _0 Temp: 98.4 F (36.9 C)  98.4 F (36.9 C) 98.9 F (37.2 C)  TempSrc: Oral  Oral Oral  SpO2: 94% 92% 98% 98%  Weight:      Height:        Intake/Output Summary (Last 24 hours) at 06/15/2020 1353 Last data filed at 06/15/2020 1300 Gross per 24 hour  Intake 960 ml  Output 1300 ml  Net -340 ml   Filed Weights   06/12/20 2040 06/14/20 0509 06/15/20 0438  Weight: (!) 153.7 kg (!) 151.7 kg (!) 149.5 kg  Examination:  General exam: Morbidly obese pleasant chronically ill female sitting up in bed, awake alert oriented x3, much less drowsy today HEENT: Neck obese unable to assess JVD CVS: S1-S2, regular rate rhythm Lungs: Distant breath sounds, few basilar rales Abdomen: Soft, nontender, bowel sounds present Extremities: No edema Neuro : more alert and interactive, oriented x3, no focal deficits  Skin: No rashes on exposed skin Psychiatry:  Mood & affect appropriate.     Data Reviewed:   CBC: Recent Labs  Lab  06/11/20 0502 06/12/20 0346 06/13/20 0505 06/14/20 0320 06/15/20 0329  WBC 9.7 8.3 9.7 8.1 7.3  NEUTROABS 6.1  --   --   --   --   HGB 13.9 14.1 14.0 13.9 13.4  HCT 43.8 45.0 45.0 44.5 42.8  MCV 99.1 99.3 100.0 100.5* 99.3  PLT 194 183 184 182 583   Basic Metabolic Panel: Recent Labs  Lab 06/11/20 0502 06/12/20 0346 06/13/20 0505 06/14/20 0320 06/15/20 0329  NA 137 134* 135 136 136  K 3.4* 4.1 3.8 3.7 3.3*  CL 99 95* 97* 97* 96*  CO2 _0 32  GLUCOSE 160* 172* 127* 114* 185*  BUN 17 18 29* 34* 31*  CREATININE 0.95 1.12* 1.15* 1.14* 1.03*  CALCIUM 8.6* 9.0 8.8* 8.7* 8.6*  MG 1.3*  --   --   --   --    GFR: Estimated Creatinine Clearance: 85.7 mL/min (A) (by C-G formula based on SCr of 1.03 mg/dL (H)). Liver Function Tests: Recent Labs  Lab 06/11/20 0006 06/11/20 0502 06/15/20 0329  AST 98* 96* 105*  ALT 167* 166* 170*  ALKPHOS 41 48 40  BILITOT 0.9 1.0 0.8  PROT 10.7* 10.1* 10.5*  ALBUMIN 3.1* 3.1* 2.9*   No results for input(s): LIPASE, AMYLASE in the last 168 hours. No results for input(s): AMMONIA in the last 168 hours. Coagulation Profile: No results for input(s): INR, PROTIME in the last 168 hours. Cardiac Enzymes: No results for input(s): CKTOTAL, CKMB, CKMBINDEX, TROPONINI in the last 168 hours. BNP (last 3 results) No results for input(s): PROBNP in the last 8760 hours. HbA1C: No results for input(s): HGBA1C in the last 72 hours. CBG: Recent Labs  Lab 06/14/20 1640 06/14/20 2134 06/15/20 0037 06/15/20 0630 06/15/20 1151  GLUCAP 239* 169* 79 114* 110*   Lipid Profile: No results for input(s): CHOL, HDL, LDLCALC, TRIG, CHOLHDL, LDLDIRECT in the last 72 hours. Thyroid Function Tests: No results for input(s): TSH, T4TOTAL, FREET4, T3FREE, THYROIDAB in the last 72 hours. Anemia Panel: No results for input(s): VITAMINB12, FOLATE, FERRITIN, TIBC, IRON, RETICCTPCT in the last 72 hours. Urine analysis:    Component Value Date/Time    COLORURINE YELLOW 11/12/2019 1450   APPEARANCEUR CLEAR 11/12/2019 1450   LABSPEC 1.011 11/12/2019 1450   PHURINE 5.5 11/12/2019 1450   GLUCOSEU NEGATIVE 11/12/2019 1450   HGBUR NEGATIVE 11/12/2019 1450   BILIRUBINUR NEGATIVE 06/14/2019 0751   KETONESUR NEGATIVE 11/12/2019 1450   PROTEINUR 2+ (A) 11/12/2019 1450   NITRITE NEGATIVE 11/12/2019 1450   LEUKOCYTESUR NEGATIVE 11/12/2019 1450   Sepsis Labs: _1 (procalcitonin:4,lacticidven:4)  ) Recent Results (from the past 240 hour(s))  SARS Coronavirus 2 by RT PCR (hospital order, performed in [place] hospital lab) Nasopharyngeal Nasopharyngeal Swab     Status: None   Collection Time: 06/10/20 11:34 PM   Specimen: Nasopharyngeal Swab  Result Value Ref Range Status   SARS Coronavirus 2 NEGATIVE NEGATIVE Final    Comment: (NOTE) SARS-CoV-2 target nucleic acids  are NOT DETECTED.  The SARS-CoV-2 RNA is generally detectable in upper and lower respiratory specimens during the acute phase of infection. The lowest concentration of SARS-CoV-2 viral copies this assay can detect is 250 copies / mL. A negative result does not preclude SARS-CoV-2 infection and should not be used as the sole basis for treatment or other patient management decisions.  A negative result may occur with improper specimen collection / handling, submission of specimen other than nasopharyngeal swab, presence of viral mutation(s) within the areas targeted by this assay, and inadequate number of viral copies (<250 copies / mL). A negative result must be combined with clinical observations, patient history, and epidemiological information.  Fact Sheet for Patients:   StrictlyIdeas.no  Fact Sheet for Healthcare Providers: BankingDealers.co.za  This test is not yet approved or  cleared by the [place] FDA and has been authorized for detection and/or diagnosis of SARS-CoV-2 by FDA under an Emergency Use  Authorization (EUA).  This EUA will remain in effect (meaning this test can be used) for the duration of the COVID-19 declaration under Section 564(b)(1) of the Act, 21 U.S.C. section 360bbb-3(b)(1), unless the authorization is terminated or revoked sooner.  Performed at [facility], [place] [address], [place], [place] [address]   Culture, blood (routine x 2) Call MD if unable to obtain prior to antibiotics being given     Status: None (Preliminary result)   Collection Time: 06/11/20  4:52 AM   Specimen: BLOOD  Result Value Ref Range Status   Specimen Description BLOOD LEFT ARM  Final   Special Requests   Final    BOTTLES DRAWN AEROBIC AND ANAEROBIC Blood Culture adequate volume   Culture   Final    NO GROWTH 3 DAYS Performed at [facility], [place] [address] [place], [place] [address]    Report Status PENDING  Incomplete  Culture, blood (routine x 2) Call MD if unable to obtain prior to antibiotics being given     Status: None (Preliminary result)   Collection Time: 06/11/20  5:07 AM   Specimen: BLOOD  Result Value Ref Range Status   Specimen Description BLOOD LEFT HAND  Final   Special Requests   Final    BOTTLES DRAWN AEROBIC AND ANAEROBIC Blood Culture adequate volume   Culture   Final    NO GROWTH 3 DAYS Performed at [facility], [place] [address], [place], [place] [address]    Report Status PENDING  Incomplete         Radiology Studies: No results found.      Scheduled Meds: . DULoxetine  60 mg Oral QHS  . enoxaparin (LOVENOX) injection  75 mg Subcutaneous Daily  . furosemide  60 mg Intravenous BID  . insulin aspart  0-9 Units Subcutaneous TID WC  . insulin aspart protamine- aspart  110 Units Subcutaneous BID WC  . ipratropium-albuterol  3 mL Nebulization BID  . mycophenolate  720 mg Oral BID  . nadolol  40 mg Oral QPM  . potassium chloride  40 mEq Oral Daily  . predniSONE  12.5 mg Oral Q breakfast  . zolpidem  5 mg Oral QHS   Continuous  Infusions: . sodium chloride Stopped (06/11/20 0603)     LOS: 4 days    Time spent: 37mn    PDomenic Polite MD [facility]  06/15/2020, 1:53 PM

## 2020-06-16 LAB — CULTURE, BLOOD (ROUTINE X 2)
Culture: NO GROWTH
Culture: NO GROWTH
Special Requests: ADEQUATE
Special Requests: ADEQUATE

## 2020-06-16 LAB — CBC
HCT: 45.9 % (ref 36.0–46.0)
Hemoglobin: 14.2 g/dL (ref 12.0–15.0)
MCH: 30.8 pg (ref 26.0–34.0)
MCHC: 30.9 g/dL (ref 30.0–36.0)
MCV: 99.6 fL (ref 80.0–100.0)
Platelets: 179 10*3/uL (ref 150–400)
RBC: 4.61 MIL/uL (ref 3.87–5.11)
RDW: 15 % (ref 11.5–15.5)
WBC: 7.8 10*3/uL (ref 4.0–10.5)
nRBC: 0 % (ref 0.0–0.2)

## 2020-06-16 LAB — BASIC METABOLIC PANEL
Anion gap: 8 (ref 5–15)
BUN: 28 mg/dL — ABNORMAL HIGH (ref 8–23)
CO2: 32 mmol/L (ref 22–32)
Calcium: 8.9 mg/dL (ref 8.9–10.3)
Chloride: 96 mmol/L — ABNORMAL LOW (ref 98–111)
Creatinine, Ser: 1.08 mg/dL — ABNORMAL HIGH (ref 0.44–1.00)
GFR calc Af Amer: >60 mL/min (ref >60)
GFR calc non Af Amer: 55 mL/min — ABNORMAL LOW (ref >60)
Glucose, Bld: 83 mg/dL (ref 70–99)
Potassium: 3.4 mmol/L — ABNORMAL LOW (ref 3.5–5.1)
Sodium: 136 mmol/L (ref 135–145)

## 2020-06-16 LAB — GLUCOSE, CAPILLARY
Glucose-Capillary: 106 mg/dL — ABNORMAL HIGH (ref 70–99)
Glucose-Capillary: 145 mg/dL — ABNORMAL HIGH (ref 70–99)
Glucose-Capillary: 175 mg/dL — ABNORMAL HIGH (ref 70–99)
Glucose-Capillary: 250 mg/dL — ABNORMAL HIGH (ref 70–99)
Glucose-Capillary: 181 mg/dL — ABNORMAL HIGH (ref 70–99)
Glucose-Capillary: 152 mg/dL — ABNORMAL HIGH (ref 70–99)

## 2020-06-16 MED ORDER — SPIRONOLACTONE 25 MG PO TABS
25.0000 mg | ORAL_TABLET | Freq: Every day | ORAL | Status: DC
  Administered 2020-06-16 – 2020-06-17 (×2): 25 mg via ORAL
  Filled 2020-06-16 (×2): qty 1

## 2020-06-16 MED ORDER — FUROSEMIDE 40 MG PO TABS
40.0000 mg | ORAL_TABLET | Freq: Two times a day (BID) | ORAL | Status: DC
  Administered 2020-06-16 – 2020-06-17 (×2): 40 mg via ORAL
  Filled 2020-06-16 (×2): qty 1

## 2020-06-16 MED ORDER — INSULIN ASPART PROT & ASPART (70-30 MIX) 100 UNIT/ML ~~LOC~~ SUSP
110.0000 [IU] | Freq: Two times a day (BID) | SUBCUTANEOUS | Status: DC
  Administered 2020-06-16 – 2020-06-17 (×3): 110 [IU] via SUBCUTANEOUS

## 2020-06-16 MED ORDER — POTASSIUM CHLORIDE CRYS ER 20 MEQ PO TBCR
40.0000 meq | EXTENDED_RELEASE_TABLET | Freq: Once | ORAL | Status: AC
  Administered 2020-06-16: 40 meq via ORAL

## 2020-06-16 NOTE — Plan of Care (Signed)
  Problem: Clinical Measurements: Goal: Will remain free from infection Outcome: Progressing Goal: Diagnostic test results will improve Outcome: Progressing Goal: Respiratory complications will improve Outcome: Progressing   Problem: Clinical Measurements: Goal: Cardiovascular complication will be avoided Outcome: Completed/Met

## 2020-06-16 NOTE — Progress Notes (Signed)
PROGRESS NOTE    Monica Ellis  ZOX:[id number] DOB: Jun 21, 1961 DOA: 06/10/2020 PCP: Ferd Hibbs, NP  Brief Narrative: Monica Ellis is a 59 year old chronically ill female with past history of SLE, autoimmune hepatitis on chronic prednisone, liver cirrhosis, morbid obesity, BMI of 55, type 2 diabetes mellitus, long-term tobacco use presented to the emergency room 7/6 evening with worsening dyspnea over 3 days, previously reported worsening of fatigue and dyspnea for a year ever since she had Covid in 2020. -Chest x-ray notable for mild interstitial edema, hypoxic O2 sats of 81% at PCPs office  and was sent to the ED. -CT chest noted basilar atelectasis without acute findings -VQ scan negative for PE, 2D echo normal EF, moderate LVH, no diastolic dysfunction -7/9 noted to be slightly more lethargic, ABG noted mild respiratory acidosis, started on BiPAP   Assessment & Plan:  Acute on chronic hypoxic respiratory failure -Multifactorial, a component of fluid overload due to underlying cirrhosis -Also suspect underlying OSA/OHS and component of chronic lung disease/COPD from long-term tobacco abuse -2D echo with preserved EF, moderate LVH, no diastolic dysfunction noted, VQ scan negative for PE -Diuresed with IV Lasix, 5.2 L negative, transition to oral Lasix and Aldactone today -7/9 was noted to be more lethargic, ABG noted mild respiratory acidosis, started on BiPAP in the afternoon and at bedtime  -Cutdown home dose of oxycodone -Ambulating with PT, check ambulatory O2 sats -Needs outpatient sleep study and pulmonary follow-up -DC planning, home health PT OT RN, O2  Positive HIV screen -HIV 1 and 2 Ab are negative, viral load is negative  SLE Autoimmune hepatitis Liver cirrhosis -Followed by [facility] -Continued on home regimen of prednisone 12.5 mg daily, goal is to wean this down as tolerated -Diuretics as above  Elevated total protein -check SPEP -likely due to  autoimmune hepatitis  Type 2 diabetes mellitus -hemoglobin A1c is 6.4, increased continue insulin 70/30 to 110 units twice daily, closer to home regimen and change sliding scale insulin to sensitive  Tobacco abuse  -Counseled -Needs PFTs to determine if she has COPD  Super morbid obesity -BMI is 55.5 -Needs diet/lifestyle modification and eventually tapered off prednisone  DVT prophylaxis: Lovenox- Code Status: Full code Family Communication: No family at bedside Disposition Plan:  Status is: Inpatient  Remains inpatient appropriate because:Inpatient level of care appropriate due to severity of illness   Dispo: The patient is from: Home              Anticipated d/c is to: Home              Anticipated d/c date is: 7/13              Patient currently is not medically stable to d/c.  Procedures:   Antimicrobials:    Subjective: -Feels a little better overall, wore BiPAP for 3 hours yesterday, headache  Objective: Vitals:   06/16/20 0809 06/16/20 0951 06/16/20 1140 06/16/20 1246  BP: 132/82  (!) 141/81 123/68  Pulse: 74  75 75  Resp: _0 Temp: 98.4 F (36.9 C)  98.4 F (36.9 C) 98.7 F (37.1 C)  TempSrc: Oral  Oral Oral  SpO2: 100% 92% 100% 96%  Weight:      Height:        Intake/Output Summary (Last 24 hours) at 06/16/2020 1520 Last data filed at 06/16/2020 1517 Gross per 24 hour  Intake 240 ml  Output 1500 ml  Net -1260 ml   [employer]  06/14/20 0509 06/15/20 0438 06/16/20 0707  Weight: (!) 151.7 kg (!) 149.5 kg (!) 148.6 kg    Examination:  General exam: Morbidly obese pleasant chronically ill-appearing female, sitting up in bed, awake alert oriented x3, less drowsy HEENT: Neck obese unable to assess JVD CVS: S1-S2, regular rate rhythm Lungs: Distant breath sounds, few basilar rales Abdomen: Soft, nontender, bowel sounds present Extremities: No edema  Neuro : more alert and interactive, oriented x3, no focal deficits  Skin: No  rashes on exposed skin Psychiatry:  Mood & affect appropriate.     Data Reviewed:   CBC: Recent Labs  Lab 06/11/20 0502 06/11/20 0502 06/12/20 0346 06/13/20 0505 06/14/20 0320 06/15/20 0329 06/16/20 0337  WBC 9.7   < > 8.3 9.7 8.1 7.3 7.8  NEUTROABS 6.1  --   --   --   --   --   --   HGB 13.9   < > 14.1 14.0 13.9 13.4 14.2  HCT 43.8   < > 45.0 45.0 44.5 42.8 45.9  MCV 99.1   < > 99.3 100.0 100.5* 99.3 99.6  PLT 194   < > 183 184 182 179 179   < > = values in this interval not displayed.   Basic Metabolic Panel: Recent Labs  Lab 06/11/20 0502 06/11/20 0502 06/12/20 0346 06/13/20 0505 06/14/20 0320 06/15/20 0329 06/16/20 0337  NA 137   < > 134* 135 136 136 136  K 3.4*   < > 4.1 3.8 3.7 3.3* 3.4*  CL 99   < > 95* 97* 97* 96* 96*  CO2 28   < > _0 32 32  GLUCOSE 160*   < > 172* 127* 114* 185* 83  BUN 17   < > 18 29* 34* 31* 28*  CREATININE 0.95   < > 1.12* 1.15* 1.14* 1.03* 1.08*  CALCIUM 8.6*   < > 9.0 8.8* 8.7* 8.6* 8.9  MG 1.3*  --   --   --   --   --   --    < > = values in this interval not displayed.   GFR: Estimated Creatinine Clearance: 81.5 mL/min (A) (by C-G formula based on SCr of 1.08 mg/dL (H)). Liver Function Tests: Recent Labs  Lab 06/11/20 0006 06/11/20 0502 06/15/20 0329  AST 98* 96* 105*  ALT 167* 166* 170*  ALKPHOS 41 48 40  BILITOT 0.9 1.0 0.8  PROT 10.7* 10.1* 10.5*  ALBUMIN 3.1* 3.1* 2.9*   No results for input(s): LIPASE, AMYLASE in the last 168 hours. No results for input(s): AMMONIA in the last 168 hours. Coagulation Profile: No results for input(s): INR, PROTIME in the last 168 hours. Cardiac Enzymes: No results for input(s): CKTOTAL, CKMB, CKMBINDEX, TROPONINI in the last 168 hours. BNP (last 3 results) No results for input(s): PROBNP in the last 8760 hours. HbA1C: No results for input(s): HGBA1C in the last 72 hours. CBG: Recent Labs  Lab 06/15/20 2041 06/16/20 0421 06/16/20 0808 06/16/20 1148 06/16/20 1243   GLUCAP 215* 106* 145* 175* 250*   Lipid Profile: No results for input(s): CHOL, HDL, LDLCALC, TRIG, CHOLHDL, LDLDIRECT in the last 72 hours. Thyroid Function Tests: No results for input(s): TSH, T4TOTAL, FREET4, T3FREE, THYROIDAB in the last 72 hours. Anemia Panel: No results for input(s): VITAMINB12, FOLATE, FERRITIN, TIBC, IRON, RETICCTPCT in the last 72 hours. Urine analysis:    Component Value Date/Time   COLORURINE YELLOW 11/12/2019 1450   APPEARANCEUR CLEAR 11/12/2019 1450  LABSPEC 1.011 11/12/2019 1450   PHURINE 5.5 11/12/2019 1450   GLUCOSEU NEGATIVE 11/12/2019 1450   HGBUR NEGATIVE 11/12/2019 1450   BILIRUBINUR NEGATIVE 06/14/2019 0751   KETONESUR NEGATIVE 11/12/2019 1450   PROTEINUR 2+ (A) 11/12/2019 1450   NITRITE NEGATIVE 11/12/2019 1450   LEUKOCYTESUR NEGATIVE 11/12/2019 1450   Sepsis Labs: _0 (procalcitonin:4,lacticidven:4)  ) Recent Results (from the past 240 hour(s))  SARS Coronavirus 2 by RT PCR (hospital order, performed in [place] hospital lab) Nasopharyngeal Nasopharyngeal Swab     Status: None   Collection Time: 06/10/20 11:34 PM   Specimen: Nasopharyngeal Swab  Result Value Ref Range Status   SARS Coronavirus 2 NEGATIVE NEGATIVE Final    Comment: (NOTE) SARS-CoV-2 target nucleic acids are NOT DETECTED.  The SARS-CoV-2 RNA is generally detectable in upper and lower respiratory specimens during the acute phase of infection. The lowest concentration of SARS-CoV-2 viral copies this assay can detect is 250 copies / mL. A negative result does not preclude SARS-CoV-2 infection and should not be used as the sole basis for treatment or other patient management decisions.  A negative result may occur with improper specimen collection / handling, submission of specimen other than nasopharyngeal swab, presence of viral mutation(s) within the areas targeted by this assay, and inadequate number of viral copies (<250 copies / mL). A negative result  must be combined with clinical observations, patient history, and epidemiological information.  Fact Sheet for Patients:   StrictlyIdeas.no  Fact Sheet for Healthcare Providers: BankingDealers.co.za  This test is not yet approved or  cleared by the [place] FDA and has been authorized for detection and/or diagnosis of SARS-CoV-2 by FDA under an Emergency Use Authorization (EUA).  This EUA will remain in effect (meaning this test can be used) for the duration of the COVID-19 declaration under Section 564(b)(1) of the Act, 21 U.S.C. section 360bbb-3(b)(1), unless the authorization is terminated or revoked sooner.  Performed at [facility], [place] [address] [place], [place] [address]   Culture, blood (routine x 2) Call MD if unable to obtain prior to antibiotics being given     Status: None   Collection Time: 06/11/20  4:52 AM   Specimen: BLOOD  Result Value Ref Range Status   Specimen Description BLOOD LEFT ARM  Final   Special Requests   Final    BOTTLES DRAWN AEROBIC AND ANAEROBIC Blood Culture adequate volume   Culture   Final    NO GROWTH 5 DAYS Performed at [facility], [place] [address], [place], [place] [address]    Report Status 06/16/2020 FINAL  Final  Culture, blood (routine x 2) Call MD if unable to obtain prior to antibiotics being given     Status: None   Collection Time: 06/11/20  5:07 AM   Specimen: BLOOD  Result Value Ref Range Status   Specimen Description BLOOD LEFT HAND  Final   Special Requests   Final    BOTTLES DRAWN AEROBIC AND ANAEROBIC Blood Culture adequate volume   Culture   Final    NO GROWTH 5 DAYS Performed at [facility], [place] [address], [place],  [address]    Report Status 06/16/2020 FINAL  Final         Radiology Studies: No results found.      Scheduled Meds: . DULoxetine  60 mg Oral QHS  . enoxaparin (LOVENOX) injection  75 mg Subcutaneous Daily  .  furosemide  40 mg Oral BID  .  insulin aspart  0-9 Units Subcutaneous TID WC  . insulin aspart protamine- aspart  110 Units Subcutaneous BID WC  . mycophenolate  720 mg Oral BID  . nadolol  40 mg Oral QPM  . potassium chloride  40 mEq Oral Daily  . predniSONE  12.5 mg Oral Q breakfast  . spironolactone  25 mg Oral Daily  . zolpidem  5 mg Oral QHS   Continuous Infusions: . sodium chloride Stopped (06/11/20 0603)     LOS: 5 days    Time spent: 52mn PDomenic Polite MD [facility]  06/16/2020, 3:20 PM

## 2020-06-16 NOTE — Progress Notes (Signed)
Pt requested to take Bipap off. Nasal cannula replaced -3L. Will continue to monitor. Jessie Foot, RN

## 2020-06-16 NOTE — Progress Notes (Signed)
RN to place pt on BIPAP once night time meds are given. Pt made aware that RT would be stopping by every few hours to check on her. RT will continue to monitor.

## 2020-06-16 NOTE — Progress Notes (Signed)
Patient oxygen weaned to room air with oxygen saturations staying above 94%.  Patient ambulated to door and back on room air with oxygen saturations staying above 90%.  Patient called nurse 15 minutes after walking c/o feeling SOB, oxygen saturation 88%, lungs clear but diminished,  placed back on 1 L [place].

## 2020-06-16 NOTE — Progress Notes (Signed)
Occupational Therapy Treatment Patient Details Name: Temeka Pore MRN: [id number] DOB: 1961/08/20 Today's Date: 06/16/2020    History of present illness Patient is a 59 y/o female who presents from PCP due to hypoxia; also with SOB and fatigue. Admitted with acute respiratory failure with hypoxia. CXR-fluid overload. PMH includes SLE, morbid obesity, brain tumor, DM2, HTN, autoimmune hepatitis, COVID 19.   OT comments  Patient continues to make steady progress towards goals in skilled OT session. Patient's session encompassed education with regard to energy conservation techniques, ADLs, and functional ambulation to increase overall activity tolerance and endurance. Pt remains frustrated with current situation and emotional at times due to need for increased level of assist, however continues to make gains in transferring to complete basic ADLs. Education provided via energy conservation handout with pt understanding. Pt advocated for 3in1 BSC as she is now on increased medication causing voiding throughout the night and wants to promote overall safety (equipment recommendation updated). Discharge remains appropriate at this time; will continue to follow acutely.    Follow Up Recommendations  Home health OT;Supervision/Assistance - 24 hour    Equipment Recommendations  3 in 1 bedside commode (Pt in need of new rollator)    Recommendations for Other Services      Precautions / Restrictions Precautions Precautions: Fall;Other (comment) Precaution Comments: watch 02 Restrictions Weight Bearing Restrictions: No       Mobility Bed Mobility Overal bed mobility: Modified Independent Bed Mobility: Supine to Sit     Supine to sit: Supervision;HOB elevated        Transfers Overall transfer level: Needs assistance Equipment used: 4-wheeled walker Transfers: Sit to/from [facility] Sit to Stand: Min guard Stand pivot transfers: Min guard       General  transfer comment: min guard for stand pivot transfers, able to complete without rollator with use of bed rail for stability, dependetnt on BUE support to ambulate further distances    Balance Overall balance assessment: Needs assistance Sitting-balance support: No upper extremity supported;Feet supported Sitting balance-Leahy Scale: Good Sitting balance - Comments: sitting EOB conversing with no trunk support, no LOB   Standing balance support: Single extremity supported;Bilateral upper extremity supported Standing balance-Leahy Scale: Poor Standing balance comment: UE support during standing                           ADL either performed or assessed with clinical judgement   ADL Overall ADL's : Needs assistance/impaired     Grooming: Set up;Wash/dry face;Sitting                   Toilet Transfer: Min Statistician Details (indicate cue type and reason): Pt min guard for [facility] transfer no need for HHA or RW Toileting- Clothing Manipulation and Hygiene: Min guard;Sit to/from stand Toileting - Clothing Manipulation Details (indicate cue type and reason): min guard for toileting hygiene in standing to ensure steadiness      Functional mobility during ADLs: Rolling walker;Min guard;Minimal assistance General ADL Comments: Pt with ability to transfer to [facility] without use of RW, however requires increased assist to travel further distances due to decreased endurance and O2 levels     Vision       Perception     Praxis      Cognition Arousal/Alertness: Awake/alert Behavior During Therapy: WFL for tasks assessed/performed Overall Cognitive Status: Within Functional Limits for tasks assessed  Exercises     Shoulder Instructions       General Comments      Pertinent Vitals/ Pain       Pain Assessment: No/denies pain  Home Living                                           Prior Functioning/Environment              Frequency  Min 2X/week        Progress Toward Goals  OT Goals(current goals can now be found in the care plan section)  Progress towards OT goals: Progressing toward goals  Acute Rehab OT Goals Patient Stated Goal: decrease O2 needs, improve endurance OT Goal Formulation: With patient Time For Goal Achievement: 06/27/20 Potential to Achieve Goals: Good  Plan Discharge plan remains appropriate    Co-evaluation                 AM-PAC OT "6 Clicks" Daily Activity     Outcome Measure   Help from another person eating meals?: None Help from another person taking care of personal grooming?: A Little Help from another person toileting, which includes using toliet, bedpan, or urinal?: A Little Help from another person bathing (including washing, rinsing, drying)?: A Lot Help from another person to put on and taking off regular upper body clothing?: A Little Help from another person to put on and taking off regular lower body clothing?: A Lot 6 Click Score: 17    End of Session Equipment Utilized During Treatment: Rolling walker;Oxygen  OT Visit Diagnosis: Unsteadiness on feet (R26.81);Other abnormalities of gait and mobility (R26.89);Muscle weakness (generalized) (M62.81);History of falling (Z91.81)   Activity Tolerance Patient tolerated treatment well;Patient limited by lethargy   Patient Left in bed;with call bell/phone within reach   Nurse Communication Mobility status        Time: 2929-0903 OT Time Calculation (min): 18 min  Charges: OT General Charges $OT Visit: 1 Visit OT Treatments $Self Care/Home Management : 8-22 mins  Corinne Ports E. Rory Xiang, COTA/L Acute Rehabilitation Services [phone] [place] 06/16/2020, 9:59 AM

## 2020-06-17 LAB — BASIC METABOLIC PANEL
Anion gap: 10 (ref 5–15)
BUN: 29 mg/dL — ABNORMAL HIGH (ref 8–23)
CO2: 28 mmol/L (ref 22–32)
Calcium: 9.4 mg/dL (ref 8.9–10.3)
Chloride: 95 mmol/L — ABNORMAL LOW (ref 98–111)
Creatinine, Ser: 1.1 mg/dL — ABNORMAL HIGH (ref 0.44–1.00)
GFR calc Af Amer: >60 mL/min (ref >60)
GFR calc non Af Amer: 54 mL/min — ABNORMAL LOW (ref >60)
Glucose, Bld: 121 mg/dL — ABNORMAL HIGH (ref 70–99)
Potassium: 4.9 mmol/L (ref 3.5–5.1)
Sodium: 133 mmol/L — ABNORMAL LOW (ref 135–145)

## 2020-06-17 LAB — PROTEIN ELECTROPHORESIS, SERUM
A/G Ratio: 0.5 — ABNORMAL LOW (ref 0.7–1.7)
Albumin ELP: 3.6 g/dL (ref 2.9–4.4)
Alpha-1-Globulin: 0.2 g/dL (ref 0.0–0.4)
Alpha-2-Globulin: 1 g/dL (ref 0.4–1.0)
Beta Globulin: 1 g/dL (ref 0.7–1.3)
Gamma Globulin: 5.1 g/dL — ABNORMAL HIGH (ref 0.4–1.8)
Globulin, Total: 7.3 g/dL — ABNORMAL HIGH (ref 2.2–3.9)
Total Protein ELP: 10.9 g/dL — ABNORMAL HIGH (ref 6.0–8.5)

## 2020-06-17 LAB — GLUCOSE, CAPILLARY
Glucose-Capillary: 154 mg/dL — ABNORMAL HIGH (ref 70–99)
Glucose-Capillary: 265 mg/dL — ABNORMAL HIGH (ref 70–99)
Glucose-Capillary: 170 mg/dL — ABNORMAL HIGH (ref 70–99)

## 2020-06-17 MED ORDER — OXYCODONE HCL 10 MG PO TABS: 5.0000 mg | ORAL_TABLET | Freq: Four times a day (QID) | ORAL | 0 refills | Status: DC | PRN

## 2020-06-17 MED ORDER — INSULIN LISPRO PROT & LISPRO (75-25 MIX) 100 UNIT/ML ~~LOC~~ SUSP: 120.0000 [IU] | Freq: Three times a day (TID) | SUBCUTANEOUS | Status: AC

## 2020-06-17 MED ORDER — NADOLOL 40 MG PO TABS: 40.0000 mg | ORAL_TABLET | Freq: Two times a day (BID) | ORAL | Status: DC

## 2020-06-17 MED ORDER — FUROSEMIDE 40 MG PO TABS: 40.0000 mg | ORAL_TABLET | Freq: Two times a day (BID) | ORAL | 0 refills | Status: DC

## 2020-06-17 MED ORDER — SPIRONOLACTONE 25 MG PO TABS: 25.0000 mg | ORAL_TABLET | Freq: Every day | ORAL | 0 refills | Status: DC

## 2020-06-17 NOTE — TOC Transition Note (Signed)
Transition of Care [facility]) - CM/SW Discharge Note   Patient Details  Name: Monica Ellis MRN: [id number] Date of Birth: Jan 08, 1961  Transition of Care [facility]) CM/SW Contact:  Bethena Roys, RN Phone Number: 06/17/2020, 11:44 AM   Clinical Narrative: Case Manager spoke with patient- plan will be to return home with home health services via [facility]. Case Manager called [facility] to make them aware of discharge home. Patient does not have oxygen at home this time. Patient is agreeable to home oxygen via [employer]. [employer] looking at notes to see if they qualify the patient. [facility] has not been approved by insurance at this time- awaiting for authorization. 3n1 will be delivered to the room. Per patient- her daughter will be able to provide transportation for home. No further needs from Case Manager at this time.   Final next level of care: [place] Barriers to Discharge: Barriers Resolved  Patient Goals and CMS Choice Patient states their goals for this hospitalization and ongoing recovery are:: return home with mom CMS Medicare.gov Compare Post Acute Care list provided to:: Patient Choice offered to / list presented to : Patient   Discharge Plan and Services In-house Referral: NA Discharge Planning Services: CM Consult Post Acute Care Choice: Durable Medical Equipment, Home Health          DME Arranged: 3-N-1, NIV, Oxygen DME Agency: [employer] Date DME Agency Contacted: 06/17/20 Time DME Agency Contacted: 0045 Representative spoke with at DME Agency: [place]: OT, [place], PT [place] Agency: [facility] (formerly [employer]) Date [place]: 06/13/20 Time [place]: 19 Representative spoke with at [place]: [place]  Readmission Risk Interventions No flowsheet data found.

## 2020-06-17 NOTE — Care Management Important Message (Signed)
Important Message  Patient Details  Name: Monica Ellis MRN: [id number] Date of Birth: 1961/10/09   Medicare Important Message Given:  Yes     Shelda Altes 06/17/2020, 3:23 PM

## 2020-06-17 NOTE — Progress Notes (Signed)
Pt is off  Bipap at this time.  Pt is on 1L [place] and no distress noted.

## 2020-06-26 NOTE — Discharge Summary (Signed)
Physician Discharge Summary  Monica Ellis MGN:[id number] DOB: 02-21-1961 DOA: 06/10/2020  PCP: Ferd Hibbs, NP  Admit date: 06/10/2020 Discharge date: 06/17/2020  Time spent: 35 minutes  Recommendations for Outpatient Follow-up:  1. [facility] for PFTS and Sleep study 2. Home Health PT/RN 3. PCP in 1 week, please check BMP at FU   Discharge Diagnoses:  Principal Problem:   Acute respiratory failure with hypoxia (HCC)   Fluid overload   Morbid Obesity   Suspected OSA/OHS   Suspected COPD   Liver Cirrhosis   Lupus ([place])   Controlled type 2 diabetes mellitus with diabetic polyneuropathy, with long-term current use of insulin (HCC)   Autoimmune hepatitis ([place])   GERD without esophagitis   Nicotine dependence, cigarettes, uncomplicated   Discharge Condition: stable  Diet recommendation: DM low sodium  Filed Weights   06/15/20 0438 06/16/20 0707 06/17/20 0456  Weight: (!) 149.5 kg (!) 148.6 kg (!) 148.3 kg    History of present illness:  Monica Ellis is a 59 year old chronically ill female with past history of SLE, autoimmune hepatitis on chronic prednisone, liver cirrhosis, morbid obesity, BMI of 55, type 2 diabetes mellitus, long-term tobacco use presented to the emergency room 7/6 evening with worsening dyspnea over 3 days, previously reported worsening of fatigue and dyspnea for a year ever since she had Covid in 2020. -Chest x-ray notable for mild interstitial edema, hypoxic O2 sats of 81% at PCPs office  and was sent to the ED. -CT chest noted basilar atelectasis without acute findings -VQ scan negative for PE, 2D echo normal EF, moderate LVH, no diastolic dysfunction -7/9 noted to be slightly more lethargic, ABG noted mild respiratory acidosis, started BIPAP  Hospital Course:   Acute on chronic hypoxic respiratory failure -Multifactorial, a component of fluid overload due to underlying cirrhosis -Also suspect underlying OSA/OHS and component of  chronic lung disease/COPD from long-term tobacco abuse -2D echo with preserved EF, moderate LVH, no diastolic dysfunction noted, VQ scan negative for PE -Diuresed with IV Lasix, 6.7 L negative, transitioned to oral Lasix and Aldactone  -On 7/9 was noted to be more lethargic, ABG noted mild respiratory acidosis, started on BiPAP in the afternoon and at bedtime  -Cutdown home dose of oxycodone -qualified for home O2 and set up with [facility] for Nocturnal ventilation -Needs outpatient sleep study and pulmonary follow-up, obtained FU -Discharged home with home health PT OT RN, O2  SLE Autoimmune hepatitis Liver cirrhosis -Followed by [facility] -Continued on home regimen of prednisone 12.5 mg daily, goal is to wean this down as tolerated -Diuretics as above  Elevated total protein - negative for monoclonal protein,  -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  Type 2 diabetes mellitus -hemoglobin A1c is 6.4, increased continue insulin 70/30 to 110 units twice daily, closer to home regimen   Tobacco abuse -Counseled, 35-40+ years of smoking -Needs PFTs to determine if she has COPD  Super morbid obesity -BMI is 55.5 -Needs diet/lifestyle modification and eventually tapered off prednisone   Discharge Exam: Vitals:   06/16/20 2252 06/17/20 0456  BP:  (!) 144/91  Pulse: 65   Resp: 12 16  Temp:  98.4 F (36.9 C)  SpO2: 99%     General: AAOx3, morbidly obese Cardiovascular: S1S2/RRR Respiratory: distant breath sounds  Discharge Instructions   Discharge Instructions    Diet - low sodium heart healthy   Complete by: As directed    Diet Carb Modified   Complete by: As directed  Increase activity slowly   Complete by: As directed      Allergies as of 06/17/2020      Reactions   Ace Inhibitors Swelling   Imuran [azathioprine] Nausea And Vomiting   Severe N/V/D and AKI after starting the med.   Lisinopril Swelling, Other (See Comments)   Other  reaction(s): Angioedema (ALLERGY/intolerance), Face   Protonix [pantoprazole] Swelling   Pt states that it causes her lips to swell.    Tramadol Other (See Comments)   Other reaction(s): Mental Status Changes (intolerance)   Iodinated Diagnostic Agents Swelling   When patient was in her 20's, swelled up all over after getting getting injection of contrast; did not have any other symptoms/ was given benadryl after that happened and had no further problems per patients/   Iodine Swelling   Janumet [sitagliptin-metformin Hcl] Other (See Comments)   Continuous yeast infection   Omeprazole Swelling   Lips will swell   Victoza [liraglutide] Nausea And Vomiting      Medication List    STOP taking these medications   amLODipine 10 MG tablet Commonly known as: NORVASC   HYDROcodone-acetaminophen 5-325 MG tablet Commonly known as: Norco   promethazine 25 MG tablet Commonly known as: PHENERGAN     TAKE these medications   ascorbic acid 500 MG tablet Commonly known as: VITAMIN C Take 2,000 mg by mouth daily.   buPROPion 150 MG 24 hr tablet Commonly known as: WELLBUTRIN XL Take 150 mg by mouth daily.   DULoxetine 60 MG capsule Commonly known as: CYMBALTA Take 60 mg by mouth at bedtime.   fexofenadine-pseudoephedrine 180-240 MG 24 hr tablet Commonly known as: ALLEGRA-D 24 Take 1 tablet by mouth daily.   FISH OIL PO Take 1 capsule by mouth every evening.   furosemide 40 MG tablet Commonly known as: LASIX Take 1 tablet (40 mg total) by mouth 2 (two) times daily. What changed:   medication strength  how much to take  when to take this   insulin lispro protamine-lispro (75-25) 100 UNIT/ML Susp injection Commonly known as: HUMALOG 75/25 MIX Inject 120 Units into the skin 3 (three) times daily with meals.   lactulose 10 GM/15ML solution Commonly known as: CHRONULAC Take 30 mLs by mouth 3 (three) times daily as needed for constipation.   magnesium 30 MG tablet Take 30  mg by mouth at bedtime.   multivitamin tablet Take 1 tablet by mouth daily.   mycophenolate 360 MG Tbec EC tablet Commonly known as: MYFORTIC Take 720 mg by mouth 2 (two) times daily.   nadolol 40 MG tablet Commonly known as: CORGARD Take 1 tablet (40 mg total) by mouth 2 (two) times daily. What changed: how much to take   NASAL SALINE NA Place 1 spray into the nose daily as needed (Congestion and Allergies).   Oxycodone HCl 10 MG Tabs Take 0.5 tablets (5 mg total) by mouth every 6 (six) hours as needed (pain). What changed: how much to take   potassium chloride SA 20 MEQ tablet Commonly known as: KLOR-CON Take 20 mEq by mouth daily.   predniSONE 5 MG tablet Commonly known as: DELTASONE Take 12.5 mg by mouth daily with breakfast.   spironolactone 25 MG tablet Commonly known as: ALDACTONE Take 1 tablet (25 mg total) by mouth daily.   TURMERIC PO Take 400 mg by mouth daily.   VITAMIN D PO Take 1 tablet by mouth every evening.   zolpidem 10 MG tablet Commonly known as: AMBIEN Take 10  mg by mouth at bedtime.      Allergies  Allergen Reactions  . Ace Inhibitors Swelling  . Imuran [Azathioprine] Nausea And Vomiting    Severe N/V/D and AKI after starting the med.  . Lisinopril Swelling and Other (See Comments)    Other reaction(s): Angioedema (ALLERGY/intolerance), Face  . Protonix [Pantoprazole] Swelling    Pt states that it causes her lips to swell.   . Tramadol Other (See Comments)    Other reaction(s): Mental Status Changes (intolerance)  . Iodinated Diagnostic Agents Swelling    When patient was in her 20's, swelled up all over after getting getting injection of contrast; did not have any other symptoms/ was given benadryl after that happened and had no further problems per patients/  . Iodine Swelling  . Janumet [Sitagliptin-Metformin Hcl] Other (See Comments)    Continuous yeast infection  . Omeprazole Swelling    Lips will swell  . Victoza [Liraglutide]  Nausea And Vomiting    Follow-up Information    [facility] Follow up.   Specialty: Home Health Services Why: the office to call to schedule home health visits- Registered Nurse, Physical Therapy, Occupational Therapy.  Contact information: [address] [place] [address] [phone]        Ferd Hibbs, NP. Schedule an appointment as soon as possible for a visit in 1 week(s).   Specialty: Nurse Practitioner Contact information: [place] [address] [phone]        Collene Gobble, MD Follow up on 07/31/2020.   Specialty: Pulmonary Disease Why: at 11:30, please call weekly to check for earlier appointments for cancellations Contact information: [place] [address] [phone]        [facility] Follow up.   Why: [facility] needs to approve.  Oxygen and 3n1 to be delivered via [employer].  Contact information: [place] [address] [phone]                The results of significant diagnostics from this hospitalization (including imaging, microbiology, ancillary and laboratory) are listed below for reference.    Significant Diagnostic Studies: DG Chest 2 View  Result Date: 06/10/2020 CLINICAL DATA:  Shortness of breath and dizziness x1 week. EXAM: CHEST - 2 VIEW COMPARISON:  December 12, 2019 FINDINGS: Very mild diffusely increased lung markings are seen, slightly more prominent in severity when compared to the prior exam. Mild linear atelectasis is seen within the left lung base. There is no evidence of a pleural effusion or pneumothorax. The cardiac silhouette is mildly enlarged. There is mild calcification of the aortic arch. The visualized skeletal structures are unremarkable. IMPRESSION: 1. Very mild diffusely increased lung markings which may reflect mild interstitial edema. 2. Mild left basilar linear atelectasis. Electronically Signed   By: Virgina Norfolk M.D.   On: 06/10/2020 16:55   CT CHEST WO CONTRAST  Result Date: 06/11/2020 CLINICAL DATA:  Shortness of breath EXAM: CT CHEST WITHOUT CONTRAST TECHNIQUE: Multidetector CT imaging of the chest was performed following the standard protocol without IV contrast. COMPARISON:  Chest x-ray from the previous day. FINDINGS: Cardiovascular: Thoracic aorta demonstrates atherosclerotic calcifications. No cardiac enlargement is noted. Coronary calcifications are seen. Pulmonary artery is not significantly enlarged. Mediastinum/Nodes: Thoracic inlet is within normal limits. Scattered small lymph nodes are noted although not significant by size criteria. The esophagus is within normal limits. Lungs/Pleura: Mild bibasilar atelectatic changes are noted. No  sizable effusion is seen. No sizable parenchymal nodule is seen. No pneumothorax is noted. Upper Abdomen: Visualized upper abdomen demonstrates nodularity of the liver consistent with underlying cirrhosis. No definitive mass lesion is seen although evaluation is limited due to lack of IV contrast. Musculoskeletal: Degenerative changes of the thoracic spine are noted. IMPRESSION: Mild bibasilar atelectatic changes. No other focal abnormality is noted. Aortic Atherosclerosis (ICD10-I70.0). Electronically Signed   By: Inez Catalina M.D.   On: 06/11/2020 00:51   NM Pulmonary Perfusion  Result Date: 06/11/2020 CLINICAL DATA:  Shortness of breath for 3 days. EXAM: NUCLEAR MEDICINE PERFUSION LUNG SCAN TECHNIQUE: Perfusion images were obtained in multiple projections after intravenous injection of radiopharmaceutical. Ventilation scans intentionally deferred if perfusion scan and chest x-ray adequate for interpretation during COVID 19 epidemic. RADIOPHARMACEUTICALS:  4.3 mCi Tc-22mMAA IV COMPARISON:  CT chest today and PA and lateral chest 06/10/2020. FINDINGS: No perfusion defect is seen. IMPRESSION: Negative for pulmonary embolus. Electronically Signed   By: TInge RiseM.D.   On: 06/11/2020 12:07   ECHOCARDIOGRAM COMPLETE  Result Date: 06/11/2020    ECHOCARDIOGRAM REPORT   Patient Name:   CZONNIESMITH-MCIVER Date of Exam: 06/11/2020 Medical Rec #:  [id number]           Height:       65.5 in Accession #:    [id number]          Weight:       338.8 lb Date of Birth:  [birth year]           BSA:          2.489 m Patient Age:    [age]years             BP:           137/68 mmHg Patient Gender: F                    HR:           72 bpm. Exam Location:  Inpatient Procedure: 2D Echo Indications:    428.21 CHF  History:        Patient has prior history of Echocardiogram examinations, most                 recent 06/08/2017. Risk Factors:Hypertension and Diabetes. Tobacco                 use disorder.  Sonographer:    VJannett CelestineRDCS (AE) Referring Phys: [id number] BurgerSSurprise [facility] Sonographer Comments: Suboptimal parasternal window and patient is morbidly obese. Image acquisition challenging due to patient body habitus. off axis apical window IMPRESSIONS  1. Left ventricular ejection fraction, by estimation, is 60 to 65%. The left ventricle has normal function. The left ventricle has no regional wall motion abnormalities. There is moderate left ventricular hypertrophy. Left ventricular diastolic parameters were normal.  2. Right ventricular systolic function is normal. The right ventricular size is normal.  3. Left atrial size was mildly dilated.  4. The mitral valve is normal in structure. No evidence of mitral valve regurgitation. No evidence of mitral stenosis.  5. The aortic valve was not well visualized. Aortic valve regurgitation is not visualized. No aortic stenosis is present.  6. The inferior vena cava is normal in size with greater than 50% respiratory variability, suggesting right atrial pressure of 3 mmHg. FINDINGS  Left Ventricle: Left ventricular ejection fraction, by estimation, is 60 to 65%. The left ventricle  has normal function. The left ventricle has no regional  wall motion abnormalities. The left ventricular internal cavity size was normal in size. There is  moderate left ventricular hypertrophy. Left ventricular diastolic parameters were normal. Right Ventricle: The right ventricular size is normal. No increase in right ventricular wall thickness. Right ventricular systolic function is normal. Left Atrium: Left atrial size was mildly dilated. Right Atrium: Right atrial size was normal in size. Pericardium: There is no evidence of pericardial effusion. Mitral Valve: The mitral valve is normal in structure. Normal mobility of the mitral valve leaflets. No evidence of mitral valve regurgitation. No evidence of mitral valve stenosis. Tricuspid Valve: The tricuspid valve is normal in structure. Tricuspid valve regurgitation is not demonstrated. No evidence of tricuspid stenosis. Aortic Valve: The aortic valve was not well visualized. Aortic valve regurgitation is not visualized. No aortic stenosis is present. Pulmonic Valve: The pulmonic valve was normal in structure. Pulmonic valve regurgitation is not visualized. No evidence of pulmonic stenosis. Aorta: The aortic root is normal in size and structure. Venous: The inferior vena cava is normal in size with greater than 50% respiratory variability, suggesting right atrial pressure of 3 mmHg. IAS/Shunts: The interatrial septum was not well visualized.  LEFT VENTRICLE PLAX 2D LVIDd:         4.83 cm  Diastology LVIDs:         3.27 cm  LV e' lateral:   9.03 cm/s LV PW:         1.10 cm  LV E/e' lateral: 10.8 LV IVS:        1.60 cm LVOT diam:     2.00 cm LV SV:         39 LV SV Index:   16 LVOT Area:     3.14 cm  LEFT ATRIUM           Index LA diam:      4.30 cm 1.73 cm/m LA Vol (A2C): 55.7 ml 22.38 ml/m  AORTIC VALVE LVOT Vmax:   70.80 cm/s LVOT Vmean:  48.700 cm/s LVOT VTI:    0.125 m  AORTA Ao Root diam: 2.90 cm MITRAL VALVE MV Area (PHT): 2.95 cm    SHUNTS MV Decel Time: 257 msec    Systemic VTI:  0.12 m MV E velocity: 97.90  cm/s  Systemic Diam: 2.00 cm MV A velocity: 69.50 cm/s MV E/A ratio:  1.41 Jenkins Rouge MD Electronically signed by Jenkins Rouge MD Signature Date/Time: 06/11/2020/11:10:50 AM    Final     Microbiology: No results found for this or any previous visit (from the past 240 hour(s)).   Labs: Basic Metabolic Panel: No results for input(s): NA, K, CL, CO2, GLUCOSE, BUN, CREATININE, CALCIUM, MG, PHOS in the last 168 hours. Liver Function Tests: No results for input(s): AST, ALT, ALKPHOS, BILITOT, PROT, ALBUMIN in the last 168 hours. No results for input(s): LIPASE, AMYLASE in the last 168 hours. No results for input(s): AMMONIA in the last 168 hours. CBC: No results for input(s): WBC, NEUTROABS, HGB, HCT, MCV, PLT in the last 168 hours. Cardiac Enzymes: No results for input(s): CKTOTAL, CKMB, CKMBINDEX, TROPONINI in the last 168 hours. BNP: BNP (last 3 results) Recent Labs    06/10/20 1630  BNP 88.4    ProBNP (last 3 results) No results for input(s): PROBNP in the last 8760 hours.  CBG: No results for input(s): GLUCAP in the last 168 hours.     Signed:  Domenic Polite MD.  [facility]  06/26/2020, 7:27 AM

## 2020-07-31 ENCOUNTER — Institutional Professional Consult (permissible substitution): Payer: Medicare Other | Admitting: Emergency Medicine

## 2020-08-28 ENCOUNTER — Encounter (HOSPITAL_COMMUNITY): Payer: Self-pay | Admitting: Internal Medicine

## 2020-08-28 ENCOUNTER — Other Ambulatory Visit: Payer: Self-pay

## 2020-08-28 ENCOUNTER — Emergency Department (HOSPITAL_COMMUNITY)

## 2020-08-28 ENCOUNTER — Inpatient Hospital Stay (HOSPITAL_COMMUNITY)
Admission: EM | Admit: 2020-08-28 | Discharge: 2020-09-05 | DRG: 189 | Disposition: A | Discharge status: Home / Self Care | Attending: Internal Medicine | Admitting: Internal Medicine

## 2020-08-28 DIAGNOSIS — Z91041 Radiographic dye allergy status: Secondary | ICD-10-CM

## 2020-08-28 DIAGNOSIS — I5082 Biventricular heart failure: Secondary | ICD-10-CM | POA: Diagnosis present

## 2020-08-28 DIAGNOSIS — I110 Hypertensive heart disease with heart failure: Secondary | ICD-10-CM | POA: Diagnosis present

## 2020-08-28 DIAGNOSIS — K754 Autoimmune hepatitis: Secondary | ICD-10-CM | POA: Diagnosis present

## 2020-08-28 DIAGNOSIS — J9811 Atelectasis: Secondary | ICD-10-CM | POA: Diagnosis present

## 2020-08-28 DIAGNOSIS — I10 Essential (primary) hypertension: Secondary | ICD-10-CM | POA: Diagnosis present

## 2020-08-28 DIAGNOSIS — R0602 Shortness of breath: Secondary | ICD-10-CM | POA: Diagnosis present

## 2020-08-28 DIAGNOSIS — Z8616 Personal history of COVID-19: Secondary | ICD-10-CM

## 2020-08-28 DIAGNOSIS — R519 Headache, unspecified: Secondary | ICD-10-CM | POA: Diagnosis present

## 2020-08-28 DIAGNOSIS — Z9981 Dependence on supplemental oxygen: Secondary | ICD-10-CM

## 2020-08-28 DIAGNOSIS — Z888 Allergy status to other drugs, medicaments and biological substances status: Secondary | ICD-10-CM | POA: Diagnosis not present

## 2020-08-28 DIAGNOSIS — J9622 Acute and chronic respiratory failure with hypercapnia: Secondary | ICD-10-CM | POA: Diagnosis present

## 2020-08-28 DIAGNOSIS — R400 Somnolence: Secondary | ICD-10-CM | POA: Diagnosis present

## 2020-08-28 DIAGNOSIS — R0902 Hypoxemia: Secondary | ICD-10-CM

## 2020-08-28 DIAGNOSIS — Z8249 Family history of ischemic heart disease and other diseases of the circulatory system: Secondary | ICD-10-CM

## 2020-08-28 DIAGNOSIS — J440 Chronic obstructive pulmonary disease with acute lower respiratory infection: Secondary | ICD-10-CM | POA: Diagnosis present

## 2020-08-28 DIAGNOSIS — F17210 Nicotine dependence, cigarettes, uncomplicated: Secondary | ICD-10-CM | POA: Diagnosis present

## 2020-08-28 DIAGNOSIS — Z9049 Acquired absence of other specified parts of digestive tract: Secondary | ICD-10-CM

## 2020-08-28 DIAGNOSIS — Z66 Do not resuscitate: Secondary | ICD-10-CM | POA: Diagnosis not present

## 2020-08-28 DIAGNOSIS — J9621 Acute and chronic respiratory failure with hypoxia: Secondary | ICD-10-CM | POA: Diagnosis present

## 2020-08-28 DIAGNOSIS — J189 Pneumonia, unspecified organism: Secondary | ICD-10-CM | POA: Diagnosis present

## 2020-08-28 DIAGNOSIS — E1165 Type 2 diabetes mellitus with hyperglycemia: Secondary | ICD-10-CM | POA: Diagnosis present

## 2020-08-28 DIAGNOSIS — E871 Hypo-osmolality and hyponatremia: Secondary | ICD-10-CM | POA: Diagnosis present

## 2020-08-28 DIAGNOSIS — I2729 Other secondary pulmonary hypertension: Secondary | ICD-10-CM | POA: Diagnosis present

## 2020-08-28 DIAGNOSIS — I5033 Acute on chronic diastolic (congestive) heart failure: Secondary | ICD-10-CM | POA: Diagnosis present

## 2020-08-28 DIAGNOSIS — Z20822 Contact with and (suspected) exposure to covid-19: Secondary | ICD-10-CM | POA: Diagnosis present

## 2020-08-28 DIAGNOSIS — Z8051 Family history of malignant neoplasm of kidney: Secondary | ICD-10-CM

## 2020-08-28 DIAGNOSIS — R001 Bradycardia, unspecified: Secondary | ICD-10-CM | POA: Diagnosis not present

## 2020-08-28 DIAGNOSIS — R945 Abnormal results of liver function studies: Secondary | ICD-10-CM | POA: Diagnosis present

## 2020-08-28 DIAGNOSIS — J9601 Acute respiratory failure with hypoxia: Secondary | ICD-10-CM | POA: Diagnosis not present

## 2020-08-28 DIAGNOSIS — Z79899 Other long term (current) drug therapy: Secondary | ICD-10-CM

## 2020-08-28 DIAGNOSIS — R7989 Other specified abnormal findings of blood chemistry: Secondary | ICD-10-CM | POA: Diagnosis present

## 2020-08-28 DIAGNOSIS — I700 Atherosclerosis of aorta: Secondary | ICD-10-CM | POA: Diagnosis present

## 2020-08-28 DIAGNOSIS — Z885 Allergy status to narcotic agent status: Secondary | ICD-10-CM

## 2020-08-28 DIAGNOSIS — Z794 Long term (current) use of insulin: Secondary | ICD-10-CM

## 2020-08-28 DIAGNOSIS — I5023 Acute on chronic systolic (congestive) heart failure: Secondary | ICD-10-CM

## 2020-08-28 DIAGNOSIS — M329 Systemic lupus erythematosus, unspecified: Secondary | ICD-10-CM | POA: Diagnosis present

## 2020-08-28 DIAGNOSIS — Z6841 Body Mass Index (BMI) 40.0 and over, adult: Secondary | ICD-10-CM | POA: Diagnosis not present

## 2020-08-28 DIAGNOSIS — K7460 Unspecified cirrhosis of liver: Secondary | ICD-10-CM | POA: Diagnosis present

## 2020-08-28 DIAGNOSIS — E662 Morbid (severe) obesity with alveolar hypoventilation: Secondary | ICD-10-CM | POA: Diagnosis present

## 2020-08-28 DIAGNOSIS — D84821 Immunodeficiency due to drugs: Secondary | ICD-10-CM | POA: Diagnosis present

## 2020-08-28 DIAGNOSIS — I509 Heart failure, unspecified: Secondary | ICD-10-CM

## 2020-08-28 DIAGNOSIS — K219 Gastro-esophageal reflux disease without esophagitis: Secondary | ICD-10-CM | POA: Diagnosis present

## 2020-08-28 DIAGNOSIS — Z7952 Long term (current) use of systemic steroids: Secondary | ICD-10-CM

## 2020-08-28 DIAGNOSIS — K7290 Hepatic failure, unspecified without coma: Secondary | ICD-10-CM | POA: Diagnosis present

## 2020-08-28 DIAGNOSIS — E876 Hypokalemia: Secondary | ICD-10-CM | POA: Diagnosis present

## 2020-08-28 DIAGNOSIS — IMO0002 Reserved for concepts with insufficient information to code with codable children: Secondary | ICD-10-CM | POA: Diagnosis present

## 2020-08-28 DIAGNOSIS — Z833 Family history of diabetes mellitus: Secondary | ICD-10-CM

## 2020-08-28 HISTORY — DX: Morbid (severe) obesity with alveolar hypoventilation: E66.2

## 2020-08-28 LAB — COMPREHENSIVE METABOLIC PANEL
ALT: 186 U/L — ABNORMAL HIGH (ref 0–44)
AST: 111 U/L — ABNORMAL HIGH (ref 15–41)
Albumin: 3.2 g/dL — ABNORMAL LOW (ref 3.5–5.0)
Alkaline Phosphatase: 41 U/L (ref 38–126)
Anion gap: 9 (ref 5–15)
BUN: 15 mg/dL (ref 8–23)
CO2: 26 mmol/L (ref 22–32)
Calcium: 8.5 mg/dL — ABNORMAL LOW (ref 8.9–10.3)
Chloride: 101 mmol/L (ref 98–111)
Creatinine, Ser: 0.93 mg/dL (ref 0.44–1.00)
GFR calc Af Amer: >60 mL/min (ref >60)
GFR calc non Af Amer: >60 mL/min (ref >60)
Glucose, Bld: 60 mg/dL — ABNORMAL LOW (ref 70–99)
Potassium: 3.1 mmol/L — ABNORMAL LOW (ref 3.5–5.1)
Sodium: 136 mmol/L (ref 135–145)
Total Bilirubin: 0.9 mg/dL (ref 0.3–1.2)
Total Protein: 10.9 g/dL — ABNORMAL HIGH (ref 6.5–8.1)

## 2020-08-28 LAB — I-STAT ARTERIAL BLOOD GAS, ED
Acid-Base Excess: 3 mmol/L — ABNORMAL HIGH (ref 0.0–2.0)
Bicarbonate: 30.7 mmol/L — ABNORMAL HIGH (ref 20.0–28.0)
Calcium, Ion: 1.18 mmol/L (ref 1.15–1.40)
HCT: 40 % (ref 36.0–46.0)
Hemoglobin: 13.6 g/dL (ref 12.0–15.0)
O2 Saturation: 93 %
Patient temperature: 99
Potassium: 3.4 mmol/L — ABNORMAL LOW (ref 3.5–5.1)
Sodium: 145 mmol/L (ref 135–145)
TCO2: 33 mmol/L — ABNORMAL HIGH (ref 22–32)
pCO2 arterial: 61.8 mmHg — ABNORMAL HIGH (ref 32.0–48.0)
pH, Arterial: 7.305 — ABNORMAL LOW (ref 7.350–7.450)
pO2, Arterial: 78 mmHg — ABNORMAL LOW (ref 83.0–108.0)

## 2020-08-28 LAB — URINALYSIS, ROUTINE W REFLEX MICROSCOPIC
Bilirubin Urine: NEGATIVE
Glucose, UA: NEGATIVE mg/dL
Ketones, ur: NEGATIVE mg/dL
Leukocytes,Ua: NEGATIVE
Nitrite: NEGATIVE
Protein, ur: 100 mg/dL — AB
Specific Gravity, Urine: 1.015 (ref 1.005–1.030)
pH: 5 (ref 5.0–8.0)

## 2020-08-28 LAB — CBC WITH DIFFERENTIAL/PLATELET
Abs Immature Granulocytes: 0.1 10*3/uL — ABNORMAL HIGH (ref 0.00–0.07)
Basophils Absolute: 0.1 10*3/uL (ref 0.0–0.1)
Basophils Relative: 0 %
Eosinophils Absolute: 0 10*3/uL (ref 0.0–0.5)
Eosinophils Relative: 0 %
HCT: 38.8 % (ref 36.0–46.0)
Hemoglobin: 12.2 g/dL (ref 12.0–15.0)
Immature Granulocytes: 1 %
Lymphocytes Relative: 21 %
Lymphs Abs: 2.4 10*3/uL (ref 0.7–4.0)
MCH: 32.4 pg (ref 26.0–34.0)
MCHC: 31.4 g/dL (ref 30.0–36.0)
MCV: 103.2 fL — ABNORMAL HIGH (ref 80.0–100.0)
Monocytes Absolute: 1 10*3/uL (ref 0.1–1.0)
Monocytes Relative: 9 %
Neutro Abs: 8 10*3/uL — ABNORMAL HIGH (ref 1.7–7.7)
Neutrophils Relative %: 69 %
Platelets: 226 10*3/uL (ref 150–400)
RBC: 3.76 MIL/uL — ABNORMAL LOW (ref 3.87–5.11)
RDW: 15.3 % (ref 11.5–15.5)
WBC: 11.5 10*3/uL — ABNORMAL HIGH (ref 4.0–10.5)
nRBC: 0.6 % — ABNORMAL HIGH (ref 0.0–0.2)

## 2020-08-28 LAB — CBC
HCT: 38.3 % (ref 36.0–46.0)
Hemoglobin: 11.7 g/dL — ABNORMAL LOW (ref 12.0–15.0)
MCH: 31.5 pg (ref 26.0–34.0)
MCHC: 30.5 g/dL (ref 30.0–36.0)
MCV: 103 fL — ABNORMAL HIGH (ref 80.0–100.0)
Platelets: 212 10*3/uL (ref 150–400)
RBC: 3.72 MIL/uL — ABNORMAL LOW (ref 3.87–5.11)
RDW: 15.3 % (ref 11.5–15.5)
WBC: 11.6 10*3/uL — ABNORMAL HIGH (ref 4.0–10.5)
nRBC: 0.4 % — ABNORMAL HIGH (ref 0.0–0.2)

## 2020-08-28 LAB — CBG MONITORING, ED
Glucose-Capillary: 58 mg/dL — ABNORMAL LOW (ref 70–99)
Glucose-Capillary: 125 mg/dL — ABNORMAL HIGH (ref 70–99)
Glucose-Capillary: 56 mg/dL — ABNORMAL LOW (ref 70–99)
Glucose-Capillary: 131 mg/dL — ABNORMAL HIGH (ref 70–99)
Glucose-Capillary: 225 mg/dL — ABNORMAL HIGH (ref 70–99)

## 2020-08-28 LAB — PROCALCITONIN: Procalcitonin: <0.1 ng/mL

## 2020-08-28 LAB — HEMOGLOBIN A1C
Hgb A1c MFr Bld: 6.2 % — ABNORMAL HIGH (ref 4.8–5.6)
Mean Plasma Glucose: 131.24 mg/dL

## 2020-08-28 LAB — CREATININE, SERUM
Creatinine, Ser: 0.91 mg/dL (ref 0.44–1.00)
GFR calc Af Amer: >60 mL/min (ref >60)
GFR calc non Af Amer: >60 mL/min (ref >60)

## 2020-08-28 LAB — TROPONIN I (HIGH SENSITIVITY)
Troponin I (High Sensitivity): 16 ng/L (ref <18)
Troponin I (High Sensitivity): 18 ng/L — ABNORMAL HIGH (ref <18)
Troponin I (High Sensitivity): 15 ng/L (ref <18)

## 2020-08-28 LAB — SARS CORONAVIRUS 2 BY RT PCR (HOSPITAL ORDER, PERFORMED IN ~~LOC~~ HOSPITAL LAB): SARS Coronavirus 2: NEGATIVE

## 2020-08-28 LAB — BRAIN NATRIURETIC PEPTIDE: B Natriuretic Peptide: 167.7 pg/mL — ABNORMAL HIGH (ref 0.0–100.0)

## 2020-08-28 LAB — TSH: TSH: 0.736 u[IU]/mL (ref 0.350–4.500)

## 2020-08-28 LAB — STREP PNEUMONIAE URINARY ANTIGEN: Strep Pneumo Urinary Antigen: NEGATIVE

## 2020-08-28 LAB — GLUCOSE, CAPILLARY: Glucose-Capillary: 228 mg/dL — ABNORMAL HIGH (ref 70–99)

## 2020-08-28 LAB — LACTIC ACID, PLASMA: Lactic Acid, Venous: 0.9 mmol/L (ref 0.5–1.9)

## 2020-08-28 MED ORDER — ONDANSETRON HCL 4 MG PO TABS: 4.0000 mg | ORAL_TABLET | Freq: Four times a day (QID) | ORAL | Status: DC | PRN

## 2020-08-28 MED ORDER — OMEGA-3-ACID ETHYL ESTERS 1 G PO CAPS
1.0000 g | ORAL_CAPSULE | Freq: Every day | ORAL | Status: DC
  Administered 2020-08-29 – 2020-09-05 (×8): 1 g via ORAL
  Filled 2020-08-28 (×9): qty 1

## 2020-08-28 MED ORDER — VANCOMYCIN HCL 10 G IV SOLR
2500.0000 mg | Freq: Once | INTRAVENOUS | Status: AC
  Administered 2020-08-28: 2500 mg via INTRAVENOUS
  Filled 2020-08-28: qty 2500

## 2020-08-28 MED ORDER — LACTULOSE 10 GM/15ML PO SOLN
20.0000 g | Freq: Three times a day (TID) | ORAL | Status: DC
  Administered 2020-08-28 – 2020-09-05 (×14): 20 g via ORAL
  Filled 2020-08-28 (×21): qty 30

## 2020-08-28 MED ORDER — MYCOPHENOLATE SODIUM 180 MG PO TBEC
720.0000 mg | DELAYED_RELEASE_TABLET | Freq: Two times a day (BID) | ORAL | Status: DC
  Administered 2020-08-28 – 2020-09-05 (×17): 720 mg via ORAL
  Filled 2020-08-28 (×18): qty 4

## 2020-08-28 MED ORDER — BUPROPION HCL ER (XL) 150 MG PO TB24
150.0000 mg | ORAL_TABLET | Freq: Every day | ORAL | Status: DC
  Administered 2020-08-28 – 2020-09-05 (×9): 150 mg via ORAL
  Filled 2020-08-28 (×9): qty 1

## 2020-08-28 MED ORDER — METHYLPREDNISOLONE SODIUM SUCC 40 MG IJ SOLR
40.0000 mg | Freq: Every day | INTRAMUSCULAR | Status: DC
  Administered 2020-08-28 – 2020-08-29 (×2): 40 mg via INTRAVENOUS
  Filled 2020-08-28 (×2): qty 1

## 2020-08-28 MED ORDER — OXYCODONE HCL 10 MG PO TABS: 5.0000 mg | ORAL_TABLET | Freq: Four times a day (QID) | ORAL | Status: DC | PRN

## 2020-08-28 MED ORDER — MAGNESIUM OXIDE 400 (241.3 MG) MG PO TABS
200.0000 mg | ORAL_TABLET | Freq: Every day | ORAL | Status: DC
  Administered 2020-08-28 – 2020-09-04 (×8): 200 mg via ORAL
  Filled 2020-08-28 (×8): qty 1

## 2020-08-28 MED ORDER — POTASSIUM CHLORIDE 10 MEQ/100ML IV SOLN
10.0000 meq | INTRAVENOUS | Status: AC
  Administered 2020-08-28 (×2): 10 meq via INTRAVENOUS
  Filled 2020-08-28 (×2): qty 100

## 2020-08-28 MED ORDER — NADOLOL 40 MG PO TABS
40.0000 mg | ORAL_TABLET | Freq: Two times a day (BID) | ORAL | Status: DC
  Administered 2020-08-28 – 2020-08-29 (×2): 40 mg via ORAL
  Filled 2020-08-28 (×3): qty 1

## 2020-08-28 MED ORDER — POTASSIUM CHLORIDE CRYS ER 20 MEQ PO TBCR
20.0000 meq | EXTENDED_RELEASE_TABLET | Freq: Every day | ORAL | Status: DC
  Administered 2020-08-28 – 2020-09-01 (×5): 20 meq via ORAL
  Filled 2020-08-28 (×5): qty 1

## 2020-08-28 MED ORDER — ONDANSETRON HCL 4 MG/2ML IJ SOLN
4.0000 mg | Freq: Four times a day (QID) | INTRAMUSCULAR | Status: DC | PRN
  Administered 2020-08-28: 4 mg via INTRAVENOUS
  Filled 2020-08-28: qty 2

## 2020-08-28 MED ORDER — IPRATROPIUM-ALBUTEROL 0.5-2.5 (3) MG/3ML IN SOLN
3.0000 mL | RESPIRATORY_TRACT | Status: DC
  Administered 2020-08-28 (×3): 3 mL via RESPIRATORY_TRACT
  Filled 2020-08-28 (×4): qty 3

## 2020-08-28 MED ORDER — ENOXAPARIN SODIUM 80 MG/0.8ML ~~LOC~~ SOLN
75.0000 mg | SUBCUTANEOUS | Status: DC
  Administered 2020-08-28 – 2020-09-05 (×9): 75 mg via SUBCUTANEOUS
  Filled 2020-08-28: qty 0.75
  Filled 2020-08-28 (×7): qty 0.8
  Filled 2020-08-28: qty 0.75
  Filled 2020-08-28: qty 0.8

## 2020-08-28 MED ORDER — POTASSIUM CHLORIDE CRYS ER 20 MEQ PO TBCR: 40.0000 meq | EXTENDED_RELEASE_TABLET | Freq: Once | ORAL | Status: DC

## 2020-08-28 MED ORDER — SPIRONOLACTONE 25 MG PO TABS
25.0000 mg | ORAL_TABLET | Freq: Every day | ORAL | Status: DC
  Administered 2020-08-29 – 2020-09-05 (×8): 25 mg via ORAL
  Filled 2020-08-28 (×9): qty 1

## 2020-08-28 MED ORDER — INSULIN ASPART 100 UNIT/ML ~~LOC~~ SOLN
0.0000 [IU] | Freq: Three times a day (TID) | SUBCUTANEOUS | Status: DC
  Administered 2020-08-29 (×2): 2 [IU] via SUBCUTANEOUS
  Administered 2020-08-29 – 2020-08-30 (×3): 1 [IU] via SUBCUTANEOUS
  Administered 2020-08-30 – 2020-08-31 (×2): 2 [IU] via SUBCUTANEOUS
  Administered 2020-08-31: 1 [IU] via SUBCUTANEOUS
  Administered 2020-08-31: 2 [IU] via SUBCUTANEOUS
  Administered 2020-09-01: 16 [IU] via SUBCUTANEOUS
  Administered 2020-09-01: 6 [IU] via SUBCUTANEOUS

## 2020-08-28 MED ORDER — DEXTROSE 50 % IV SOLN
1.0000 | Freq: Once | INTRAVENOUS | Status: AC
  Administered 2020-08-28: 50 mL via INTRAVENOUS
  Filled 2020-08-28: qty 50

## 2020-08-28 MED ORDER — DULOXETINE HCL 60 MG PO CPEP
60.0000 mg | ORAL_CAPSULE | Freq: Every day | ORAL | Status: DC
  Administered 2020-08-28 – 2020-09-04 (×8): 60 mg via ORAL
  Filled 2020-08-28 (×9): qty 1

## 2020-08-28 MED ORDER — SODIUM CHLORIDE 0.9 % IV SOLN
2.0000 g | INTRAVENOUS | Status: DC
  Administered 2020-08-28: 2 g via INTRAVENOUS
  Filled 2020-08-28: qty 20

## 2020-08-28 MED ORDER — INSULIN ASPART PROT & ASPART (70-30 MIX) 100 UNIT/ML ~~LOC~~ SUSP
90.0000 [IU] | Freq: Three times a day (TID) | SUBCUTANEOUS | Status: DC
  Administered 2020-08-28 – 2020-09-02 (×10): 90 [IU] via SUBCUTANEOUS
  Filled 2020-08-28 (×3): qty 10

## 2020-08-28 MED ORDER — SODIUM CHLORIDE 0.9 % IV SOLN
500.0000 mg | INTRAVENOUS | Status: DC
  Administered 2020-08-28: 500 mg via INTRAVENOUS
  Filled 2020-08-28: qty 500

## 2020-08-28 MED ORDER — VANCOMYCIN HCL 1250 MG/250ML IV SOLN: 1250.0000 mg | Freq: Two times a day (BID) | INTRAVENOUS | Status: DC

## 2020-08-28 MED ORDER — SODIUM CHLORIDE 0.9 % IV SOLN
2.0000 g | Freq: Three times a day (TID) | INTRAVENOUS | Status: DC
  Administered 2020-08-28: 2 g via INTRAVENOUS
  Filled 2020-08-28: qty 2

## 2020-08-28 MED ORDER — FUROSEMIDE 10 MG/ML IJ SOLN
60.0000 mg | Freq: Two times a day (BID) | INTRAMUSCULAR | Status: DC
  Administered 2020-08-28 – 2020-08-30 (×4): 60 mg via INTRAVENOUS
  Filled 2020-08-28 (×4): qty 6

## 2020-08-28 NOTE — ED Notes (Signed)
Pure wick was placed on pt

## 2020-08-28 NOTE — ED Notes (Signed)
MD page for sugar 58, MD [place] gave verbal for amp of D50

## 2020-08-28 NOTE — Progress Notes (Signed)
Pt  c/o not labored breathing very diaphoretic 02 sats dropped 88-84% put on 15L nrb and cont pulse ox. Sats returned to normal 95% 02 other vss. Awaiting new orders if any. Updated MD.

## 2020-08-28 NOTE — Progress Notes (Signed)
RT went into room to put pt on BIPAP pt was drinking coke and we will to wait for 1 hr for  pt to go on bipap. BIPAP standby. RN is aware.

## 2020-08-28 NOTE — Progress Notes (Signed)
  Pharmacy Antibiotic Note  Monica Ellis is a 59 y.o. female admitted on 08/28/2020 with pneumonia.  Pharmacy has been consulted for vancomycin and cefepime dosing.  WBC elevated, good renal fx, BMI 54.  Plan: Vancomycin 2583m x1 then 12544mQ12hr Cefepime 2g Q 8hr  Monitor cultures, clinical status, renal fx Narrow abx as able and f/u duration    Height: _0  (165.1 cm) Weight: (!) 149.7 kg (330 lb) IBW/kg (Calculated) : 57  Temp (24hrs), Avg:99.2 F (37.3 C), Min:99 F (37.2 C), Max:99.3 F (37.4 C)  Recent Labs  Lab 08/28/20 0155  WBC 11.5*  CREATININE 0.93  LATICACIDVEN 0.9    Estimated Creatinine Clearance: 94.4 mL/min (by C-G formula based on SCr of 0.93 mg/dL).    Antimicrobials this admission: Cefepime 9/23 >>  Vanc 9/23 >>    Microbiology results: 9/23 BCx: pend   Thank you for allowing pharmacy to be a part of this patient's care.  LyBenetta SparPharmD, BCPS, BCCP Clinical Pharmacist  Please check AMION for all [facility] numbers After 10:00 PM, call [phone]

## 2020-08-28 NOTE — ED Triage Notes (Signed)
Pt BIB [facility] from home c/o worsening SOB. Reports malaise and weakness for past week with worsening SOB and fever today. C/o dry cough. Pt took tylenol PTA.  Hx Lupus with chronic 2 L O2.

## 2020-08-28 NOTE — H&P (Addendum)
History and Physical    Monica Ellis MCN:[id number] DOB: November 21, 1961 DOA: 08/28/2020  PCP: Monica Hibbs, NP   Patient coming from: Home.  Chief Complaint: Shortness of breath.  HPI: Monica Ellis is a 59 y.o. female with history of autoimmune hepatitis/lupus, morbid obesity, diabetes mellitus type 2, tobacco abuse, obesity hypoventilation syndrome/COPD presents to the ER because of increasing shortness of breath over the last 2 days.  Patient states she has become progressively short of breath over the last few days but in the last 2 days with some productive cough.  Has been some chest pressure and also has gained about 100 pounds per the last few months as per the patient.  Patient was just recently discharged 2 months ago after being admitted for respiratory failure at the time patient was diuresed.  ED Course: In the ER patient's troponins were negative BNP was 167 Covid test was negative chest x-ray shows bilateral infiltrates patient was hypoxic requiring nonrebreather mildly lethargic but answering questions appropriately.  Patient's labs are showing mildly elevated LFTs consistent with her patient's known history of autoimmune hepatitis.  Since chest x-ray showing bilateral infiltrates patient was started on empiric antibiotics for pneumonia and admitted for further management.  Review of Systems: As per HPI, rest all negative.   Past Medical History:  Diagnosis Date  . 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 Brain tumor ([place])    hx of  . Diabetes mellitus without complication ([place])   . GERD (gastroesophageal reflux disease)   . Hypertension   . Lupus ([place])   . Morbid obesity ([place])   . Pneumonia   . Shortness of breath dyspnea   . Tobacco use disorder     Past Surgical History:  Procedure Laterality Date  . CHOLECYSTECTOMY    . DILATION AND CURETTAGE OF UTERUS    . EYE SURGERY Bilateral    laser surgery   . IR TRANSCATHETER BX   11/03/2018  . IR US GUIDE VASC ACCESS RIGHT  11/03/2018  . IR VENOGRAM HEPATIC W HEMODYNAMIC EVALUATION  11/03/2018     reports that she has been smoking cigarettes. She has a 30.00 pack-year smoking history. She has never used smokeless tobacco. She reports that she does not drink alcohol and does not use drugs.  Allergies  Allergen Reactions  . Ace Inhibitors Swelling  . Imuran [Azathioprine] Nausea And Vomiting    Severe N/V/D and AKI after starting the med.  . Lisinopril Swelling and Other (See Comments)    Other reaction(s): Angioedema (ALLERGY/intolerance), Face  . Protonix [Pantoprazole] Swelling    Pt states that it causes her lips to swell.   . Tramadol Other (See Comments)    Other reaction(s): Mental Status Changes (intolerance)  . Iodinated Diagnostic Agents Swelling    When patient was in her 20's, swelled up all over after getting getting injection of contrast; did not have any other symptoms/ was given benadryl after that happened and had no further problems per patients/  . Iodine Swelling  . Janumet [Sitagliptin-Metformin Hcl] Other (See Comments)    Continuous yeast infection  . Omeprazole Swelling    Lips will swell  . Victoza [Liraglutide] Nausea And Vomiting    Family History  Problem Relation Age of Onset  . Diabetes Father   . Hypertension Father   . Hypertension Mother   . Kidney cancer Maternal Uncle   . Cancer Maternal Uncle        brain  Prior to Admission medications   Medication Sig Start Date End Date Taking? Authorizing Provider  ascorbic acid (VITAMIN C) 500 MG tablet Take 2,000 mg by mouth daily.    [provider]  buPROPion (WELLBUTRIN XL) 150 MG 24 hr tablet Take 150 mg by mouth daily. 05/21/20   [provider]  Cholecalciferol (VITAMIN D PO) Take 1 tablet by mouth every evening.     [provider]  DULoxetine (CYMBALTA) 60 MG capsule Take 60 mg by mouth at bedtime.  08/26/16   [provider]   fexofenadine-pseudoephedrine (ALLEGRA-D 24) 180-240 MG 24 hr tablet Take 1 tablet by mouth daily.    [provider]  furosemide (LASIX) 40 MG tablet Take 1 tablet (40 mg total) by mouth 2 (two) times daily. 06/17/20   Domenic Polite, MD  insulin lispro protamine-lispro (HUMALOG 75/25 MIX) (75-25) 100 UNIT/ML SUSP injection Inject 120 Units into the skin 3 (three) times daily with meals. 06/17/20   Domenic Polite, MD  lactulose (CHRONULAC) 10 GM/15ML solution Take 30 mLs by mouth 3 (three) times daily as needed for constipation. 11/22/19   [provider]  magnesium 30 MG tablet Take 30 mg by mouth at bedtime.     [provider]  Multiple Vitamin (MULTIVITAMIN) tablet Take 1 tablet by mouth daily.     [provider]  mycophenolate (MYFORTIC) 360 MG TBEC EC tablet Take 720 mg by mouth 2 (two) times daily. 05/13/20   [provider]  nadolol (CORGARD) 40 MG tablet Take 1 tablet (40 mg total) by mouth 2 (two) times daily. 06/17/20   Domenic Polite, MD  NASAL SALINE NA Place 1 spray into the nose daily as needed (Congestion and Allergies).    [provider]  Omega-3 Fatty Acids (FISH OIL PO) Take 1 capsule by mouth every evening.     [provider]  Oxycodone HCl 10 MG TABS Take 0.5 tablets (5 mg total) by mouth every 6 (six) hours as needed (pain). 06/17/20   Domenic Polite, MD  potassium chloride SA (KLOR-CON) 20 MEQ tablet Take 20 mEq by mouth daily. 03/12/20   [provider]  predniSONE (DELTASONE) 5 MG tablet Take 12.5 mg by mouth daily with breakfast.     [provider]  spironolactone (ALDACTONE) 25 MG tablet Take 1 tablet (25 mg total) by mouth daily. 06/18/20   Domenic Polite, MD  TURMERIC PO Take 400 mg by mouth daily.     [provider]  zolpidem (AMBIEN) 10 MG tablet Take 10 mg by mouth at bedtime. 05/15/19   [provider]    Physical Exam: Constitutional: Moderately built and  nourished. Vitals:   08/28/20 0315 08/28/20 0345 08/28/20 0400 08/28/20 0500  BP: (!) 112/96 129/77 130/62 137/77  Pulse: 72 69 66 64  Resp: (!) 24 (!) 25 (!) 25 (!) 24  Temp: 99 F (37.2 C)     TempSrc: Oral     SpO2: 94% 94% 95% 95%  Weight:      Height:       Eyes: Anicteric no pallor. ENMT: No discharge from the ears eyes nose or mouth. Neck: No mass felt.  No neck rigidity.  JVD not appreciated. Respiratory: No rhonchi or crepitations. Cardiovascular: S1-S2 heard. Abdomen: Soft nontender bowel sound present. Musculoskeletal: Lateral lower extremity edema present. Skin: No rash. Neurologic: Mildly lethargic but answers questions appropriately and is oriented to time place and person moves all extremities. Psychiatric: Mildly lethargic but answers questions  appropriately.   Labs on Admission: I have personally reviewed following labs and imaging studies  CBC: Recent Labs  Lab 08/28/20 0155  WBC 11.5*  NEUTROABS 8.0*  HGB 12.2  HCT 38.8  MCV 103.2*  PLT 951   Basic Metabolic Panel: Recent Labs  Lab 08/28/20 0155  NA 136  K 3.1*  CL 101  CO2 26  GLUCOSE 60*  BUN 15  CREATININE 0.93  CALCIUM 8.5*   GFR: Estimated Creatinine Clearance: 94.4 mL/min (by C-G formula based on SCr of 0.93 mg/dL). Liver Function Tests: Recent Labs  Lab 08/28/20 0155  AST 111*  ALT 186*  ALKPHOS 41  BILITOT 0.9  PROT 10.9*  ALBUMIN 3.2*   No results for input(s): LIPASE, AMYLASE in the last 168 hours. No results for input(s): AMMONIA in the last 168 hours. Coagulation Profile: No results for input(s): INR, PROTIME in the last 168 hours. Cardiac Enzymes: No results for input(s): CKTOTAL, CKMB, CKMBINDEX, TROPONINI in the last 168 hours. BNP (last 3 results) No results for input(s): PROBNP in the last 8760 hours. HbA1C: No results for input(s): HGBA1C in the last 72 hours. CBG: No results for input(s): GLUCAP in the last 168 hours. Lipid Profile: No results for  input(s): CHOL, HDL, LDLCALC, TRIG, CHOLHDL, LDLDIRECT in the last 72 hours. Thyroid Function Tests: No results for input(s): TSH, T4TOTAL, FREET4, T3FREE, THYROIDAB in the last 72 hours. Anemia Panel: No results for input(s): VITAMINB12, FOLATE, FERRITIN, TIBC, IRON, RETICCTPCT in the last 72 hours. Urine analysis:    Component Value Date/Time   COLORURINE YELLOW 11/12/2019 1450   APPEARANCEUR CLEAR 11/12/2019 1450   LABSPEC 1.011 11/12/2019 1450   PHURINE 5.5 11/12/2019 1450   GLUCOSEU NEGATIVE 11/12/2019 1450   HGBUR NEGATIVE 11/12/2019 1450   BILIRUBINUR NEGATIVE 06/14/2019 0751   KETONESUR NEGATIVE 11/12/2019 1450   PROTEINUR 2+ (A) 11/12/2019 1450   NITRITE NEGATIVE 11/12/2019 1450   LEUKOCYTESUR NEGATIVE 11/12/2019 1450   Sepsis Labs: _0 (procalcitonin:4,lacticidven:4) ) Recent Results (from the past 240 hour(s))  SARS Coronavirus 2 by RT PCR (hospital order, performed in [place] hospital lab) Nasopharyngeal Nasopharyngeal Swab     Status: None   Collection Time: 08/28/20  1:38 AM   Specimen: Nasopharyngeal Swab  Result Value Ref Range Status   SARS Coronavirus 2 NEGATIVE NEGATIVE Final    Comment: (NOTE) SARS-CoV-2 target nucleic acids are NOT DETECTED.  The SARS-CoV-2 RNA is generally detectable in upper and lower respiratory specimens during the acute phase of infection. The lowest concentration of SARS-CoV-2 viral copies this assay can detect is 250 copies / mL. A negative result does not preclude SARS-CoV-2 infection and should not be used as the sole basis for treatment or other patient management decisions.  A negative result may occur with improper specimen collection / handling, submission of specimen other than nasopharyngeal swab, presence of viral mutation(s) within the areas targeted by this assay, and inadequate number of viral copies (<250 copies / mL). A negative result must be combined with clinical observations, patient history, and  epidemiological information.  Fact Sheet for Patients:   StrictlyIdeas.no  Fact Sheet for Healthcare Providers: BankingDealers.co.za  This test is not yet approved or  cleared by the [place] FDA and has been authorized for detection and/or diagnosis of SARS-CoV-2 by FDA under an Emergency Use Authorization (EUA).  This EUA will remain in effect (meaning this test can be used) for the duration of the COVID-19 declaration under Section 564(b)(1) of the Act, 21 U.S.C.  section 360bbb-3(b)(1), unless the authorization is terminated or revoked sooner.  Performed at [facility], [place] [address] [place], [place] [address]      Radiological Exams on Admission: DG Chest Port 1 View  Result Date: 08/28/2020 CLINICAL DATA:  Shortness of breath and fevers EXAM: PORTABLE CHEST 1 VIEW COMPARISON:  06/10/2020 FINDINGS: Cardiac shadow is prominent but stable. Aortic calcifications are noted. Bibasilar infiltrates are noted new from the prior exam. No sizable effusion is seen. No bony abnormality is noted. IMPRESSION: Bibasilar airspace opacities.  Correlate with COVID-19 testing. Electronically Signed   By: Inez Catalina M.D.   On: 08/28/2020 02:14    EKG: Independently reviewed.  Normal sinus rhythm.  Assessment/Plan Principal Problem:   Acute on chronic respiratory failure with hypoxia (HCC) Active Problems:   Uncontrolled type 2 diabetes mellitus (HCC)   Lupus (HCC)   Autoimmune hepatitis ([place])   Essential hypertension   Class 3 severe obesity due to excess calories with serious comorbidity and body mass index (BMI) of 40.0 to 44.9 in adult ([place])    1. Acute on chronic respiratory failure with hypoxia likely a combination of fluid overload and possible pneumonia and obesity hypoventilation.  I have placed patient on Lasix 60 mg IV every 12.  Last EF measured in July 2021 was 60 to 65%.  Closely follow intake output metabolic panel.   Also placed on antibiotics and check procalcitonin.  If negative may discontinue antibiotics.  I will get an ABG will place patient on BiPAP.  Continue nebulizer for COPD.  Check D-dimer and high sensitive troponin. 2. Mildly lethargic will check ABG for hypercarbia and also ammonia levels.  Will hold off patient's oxycodone and will continue lactulose. 3. Autoimmune hepatitis with elevated LFTs on mycophenolate and prednisone.  Presently monitoring prednisone as IV Solu-Medrol. 4. Diabetes mellitus type 2 am decreasing patient's NovoLog 70/30 from 120 units to 90 units 3 times daily for now until we make sure patient blood sugar stable.  Closely follow CBGs at patient is also on steroids. 5. History of lupus presently on immunosuppressants. 6. COPD will continue nebulizer and steroids. 7. Morbid obesity will need counseling.  Since patient is acute respiratory failure with hypoxia will need close monitoring for any further deterioration in inpatient status.   DVT prophylaxis: Lovenox. Code Status: Full code. Family Communication: Discussed with patient. Disposition Plan: Home. Consults called: None. Admission status: Inpatient.   Rise Patience MD [facility] Pager [phone].  If 7PM-7AM, please contact night-coverage [email or web address] Password [email or web address]  08/28/2020, 5:45 AM

## 2020-08-28 NOTE — Progress Notes (Signed)
PROGRESS NOTE    Monica Ellis  EOF:[id number] DOB: [birth year] DOA: 08/28/2020 PCP: Ferd Hibbs, NP   Brief Narrative:  Monica Ellis is a 59 y.o. female with history of autoimmune hepatitis/lupus, morbid obesity, diabetes mellitus type 2, tobacco abuse, obesity hypoventilation syndrome/COPD presents to the ER because of increasing shortness of breath over the last 2 days.  Patient states she has become progressively short of breath over the last few days but in the last 2 days with some productive cough.  Has been some chest pressure and also has gained about 100 pounds per the last few months as per the patient.  Patient was just recently discharged 2 months ago after being admitted for respiratory failure at the time patient was diuresed. In the ER patient's troponins were negative BNP was 167 Covid test was negative chest x-ray shows bilateral infiltrates patient was hypoxic requiring nonrebreather mildly lethargic but answering questions appropriately.  Patient's labs are showing mildly elevated LFTs consistent with her patient's known history of autoimmune hepatitis.  Since chest x-ray showing bilateral infiltrates patient was started on empiric antibiotics for pneumonia and admitted for further management.   Assessment & Plan:   Principal Problem:   Acute on chronic respiratory failure with hypoxia (HCC) Active Problems:   Uncontrolled type 2 diabetes mellitus (HCC)   Lupus (HCC)   Autoimmune hepatitis ([place])   Essential hypertension   Class 3 severe obesity due to excess calories with serious comorbidity and body mass index (BMI) of 40.0 to 44.9 in adult [facility])   Acute on chronic respiratory failure with hypoxia and hypercarbia, POA In the setting of volume overload/pulmonary edema - Lower extremity edema, orthopnea, bibasilar rales all consistent with volume overload; recent history of similar episode in presentation with resolution on diuretics - Continue Lasix 60  IV twice daily - Last EF measured in July 2021 was 60 to 25% without systolic or diastolic dysfunction.   - Procalcitonin negative, discontinue antibiotics -Continue to wean oxygen down to baseline which appears to be 2 to 3 L nasal cannula around-the-clock SpO2: 96 % O2 Flow Rate (L/min): 15 L/min  Autoimmune hepatitis with elevated LFTs on mycophenolate and prednisone.  Currently on IV Solu-Medrol.  Diabetes mellitus type 2  Decreasing patient's NovoLog 70/30 from 120 units to 90 units 3 times daily for now until we make sure patient blood sugar stable.   Closely follow CBGs at patient is also on steroids. Continue hypoglycemic protocol, sliding scale insulin  History of lupus presently on immunosuppressants.  COPD, not in acute exacerbation will continue nebulizer and steroids.  Morbid obesity Estimated body mass index is 54.91 kg/m as calculated from the following:   Height as of this encounter: _0  (1.651 m).   Weight as of this encounter: 149.7 kg. Lengthy discussion at bedside on need for dietary and lifestyle changes Likely exacerbating patient's above hypoxia with obesity hypoventilation syndrome   DVT prophylaxis: Lovenox. Code Status: Full code. Family Communication: None present  Status is: Patient  Dispo: The patient is from: Home              Anticipated d/c is to: To be determined              Anticipated d/c date is: 72 to 96 hours pending clinical course              Patient currently not medically stable for discharge in the setting of acute hypoxic hypercarbic respiratory failure volume overload need for close monitoring and  supplemental oxygen well above baseline  Consultants:   None  Procedures:   None  Antimicrobials:  Azithromycin/ceftriaxone discontinued 08/28/2020  Subjective: No acute issues this morning, tolerating BiPAP mask well, states she feels markedly improved since admission but not yet back to baseline.  She otherwise denies  chest pain, headache, fever, chills.  She does report orthopnea and lower extremity edema but that her orthopnea is markedly improved on BiPAP.  Objective: Vitals:   08/28/20 0530 08/28/20 0600 08/28/20 0630 08/28/20 0745  BP: (!) 141/75 (!) 137/46 133/76 136/69  Pulse: 64 62 62 61  Resp: (!) 21 (!) 21 (!) 22 20  Temp:      TempSrc:      SpO2: 93% 94% 95% 97%  Weight:      Height:        Intake/Output Summary (Last 24 hours) at 08/28/2020 0810 Last data filed at 08/28/2020 0634 Gross per 24 hour  Intake 350 ml  Output --  Net 350 ml   Filed Weights   08/28/20 0135  Weight: (!) 149.7 kg    Examination:  General:  Pleasantly resting in bed, No acute distress. HEENT:  Normocephalic atraumatic.  Sclerae nonicteric, noninjected.  Extraocular movements intact bilaterally. Neck:  Without mass or deformity.  Trachea is midline. Lungs: Bibasilar rales Heart:  Regular rate and rhythm.  Without murmurs, rubs, or gallops. Abdomen:  Soft, nontender, nondistended.  Without guarding or rebound. Extremities: 2+ pitting edema bilateral lower extremity Vascular:  Dorsalis pedis and posterior tibial pulses palpable bilaterally. Skin:  Warm and dry, no erythema, no ulcerations.    Data Reviewed: I have personally reviewed following labs and imaging studies  CBC: Recent Labs  Lab 08/28/20 0155 08/28/20 0611 08/28/20 0647  WBC 11.5*  --  11.6*  NEUTROABS 8.0*  --   --   HGB 12.2 13.6 11.7*  HCT 38.8 40.0 38.3  MCV 103.2*  --  103.0*  PLT 226  --  323   Basic Metabolic Panel: Recent Labs  Lab 08/28/20 0155 08/28/20 0611 08/28/20 0647  NA 136 145  --   K 3.1* 3.4*  --   CL 101  --   --   CO2 26  --   --   GLUCOSE 60*  --   --   BUN 15  --   --   CREATININE 0.93  --  0.91  CALCIUM 8.5*  --   --    GFR: Estimated Creatinine Clearance: 96.4 mL/min (by C-G formula based on SCr of 0.91 mg/dL). Liver Function Tests: Recent Labs  Lab 08/28/20 0155  AST 111*  ALT 186*   ALKPHOS 41  BILITOT 0.9  PROT 10.9*  ALBUMIN 3.2*   No results for input(s): LIPASE, AMYLASE in the last 168 hours. No results for input(s): AMMONIA in the last 168 hours. Coagulation Profile: No results for input(s): INR, PROTIME in the last 168 hours. Cardiac Enzymes: No results for input(s): CKTOTAL, CKMB, CKMBINDEX, TROPONINI in the last 168 hours. BNP (last 3 results) No results for input(s): PROBNP in the last 8760 hours. HbA1C: Recent Labs    08/28/20 0647  HGBA1C 6.2*   CBG: Recent Labs  Lab 08/28/20 0728 08/28/20 0741 08/28/20 0804  GLUCAP 58* 56* 125*   Lipid Profile: No results for input(s): CHOL, HDL, LDLCALC, TRIG, CHOLHDL, LDLDIRECT in the last 72 hours. Thyroid Function Tests: No results for input(s): TSH, T4TOTAL, FREET4, T3FREE, THYROIDAB in the last 72 hours. Anemia Panel: No results for input(s):  VITAMINB12, FOLATE, FERRITIN, TIBC, IRON, RETICCTPCT in the last 72 hours. Sepsis Labs: Recent Labs  Lab 08/28/20 0155  LATICACIDVEN 0.9    Recent Results (from the past 240 hour(s))  SARS Coronavirus 2 by RT PCR (hospital order, performed in [facility] hospital lab) Nasopharyngeal Nasopharyngeal Swab     Status: None   Collection Time: 08/28/20  1:38 AM   Specimen: Nasopharyngeal Swab  Result Value Ref Range Status   SARS Coronavirus 2 NEGATIVE NEGATIVE Final    Comment: (NOTE) SARS-CoV-2 target nucleic acids are NOT DETECTED.  The SARS-CoV-2 RNA is generally detectable in upper and lower respiratory specimens during the acute phase of infection. The lowest concentration of SARS-CoV-2 viral copies this assay can detect is 250 copies / mL. A negative result does not preclude SARS-CoV-2 infection and should not be used as the sole basis for treatment or other patient management decisions.  A negative result may occur with improper specimen collection / handling, submission of specimen other than nasopharyngeal swab, presence of viral mutation(s)  within the areas targeted by this assay, and inadequate number of viral copies (<250 copies / mL). A negative result must be combined with clinical observations, patient history, and epidemiological information.  Fact Sheet for Patients:   StrictlyIdeas.no  Fact Sheet for Healthcare Providers: BankingDealers.co.za  This test is not yet approved or  cleared by the [place] FDA and has been authorized for detection and/or diagnosis of SARS-CoV-2 by FDA under an Emergency Use Authorization (EUA).  This EUA will remain in effect (meaning this test can be used) for the duration of the COVID-19 declaration under Section 564(b)(1) of the Act, 21 U.S.C. section 360bbb-3(b)(1), unless the authorization is terminated or revoked sooner.  Performed at [facility], [place] [address], [place], [place] [address]          Radiology Studies: DG Chest Port 1 View  Result Date: 08/28/2020 CLINICAL DATA:  Shortness of breath and fevers EXAM: PORTABLE CHEST 1 VIEW COMPARISON:  06/10/2020 FINDINGS: Cardiac shadow is prominent but stable. Aortic calcifications are noted. Bibasilar infiltrates are noted new from the prior exam. No sizable effusion is seen. No bony abnormality is noted. IMPRESSION: Bibasilar airspace opacities.  Correlate with COVID-19 testing. Electronically Signed   By: Inez Catalina M.D.   On: 08/28/2020 02:14        Scheduled Meds: . buPROPion  150 mg Oral Daily  . DULoxetine  60 mg Oral QHS  . enoxaparin (LOVENOX) injection  75 mg Subcutaneous Q24H  . furosemide  60 mg Intravenous Q12H  . insulin aspart  0-6 Units Subcutaneous TID WC  . insulin aspart protamine- aspart  90 Units Subcutaneous TID AC  . ipratropium-albuterol  3 mL Nebulization Q4H  . lactulose  20 g Oral TID  . magnesium  30 mg Oral QHS  . methylPREDNISolone (SOLU-MEDROL) injection  40 mg Intravenous Daily  . mycophenolate  720 mg Oral BID  . nadolol   40 mg Oral BID  . omega-3 acid ethyl esters  1 g Oral Daily  . potassium chloride SA  20 mEq Oral Daily  . spironolactone  25 mg Oral Daily   Continuous Infusions: . azithromycin    . cefTRIAXone (ROCEPHIN)  IV    . potassium chloride 10 mEq (08/28/20 0727)     LOS: 0 days   Time spent: 69mn  Lewin Pellow C Marshae Azam, DO [facility]  If 7PM-7AM, please contact night-coverage [email or web address]  08/28/2020, 8:10 AM

## 2020-08-28 NOTE — ED Provider Notes (Signed)
[facility] EMERGENCY DEPARTMENT Provider Note   CSN: [id number] Arrival date & time: 08/28/20  0126     History Chief Complaint  Patient presents with  . Shortness of Breath    Monica Ellis is a 59 y.o. female.  The history is provided by the patient and medical records.   58 y.o. F with hx of AI hepatitis (currently on cellcept and prednisone), lupus, DM, GERD, HTN, obesity, presenting to the ED for SOB.  Patient has had 1 week of body aches, fatigue, overall malaise, productive cough developed yesterday along with SOB.  Found to have fever and worsening SOB today.  She is chronically on 2L O2, was in the 80's on EMS.  Placed on NRB with EMS and sats improved to upper 90's.  They attempted to wean her down to 10L but became increasingly SOB.  Patient has not been vaccinated against covid-- states PCP encouraged her not do because of her Lupus and other AI disease.  Her other family members have been vaccinated but she continues to try and isolate in the house as much as possible.  Past Medical History:  Diagnosis Date  . 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 Brain tumor ([place])    hx of  . Diabetes mellitus without complication ([place])   . GERD (gastroesophageal reflux disease)   . Hypertension   . Lupus ([place])   . Morbid obesity ([place])   . Pneumonia   . Shortness of breath dyspnea   . Tobacco use disorder     Patient Active Problem List   Diagnosis Date Noted  . Nicotine dependence, cigarettes, uncomplicated 74/16/3845  . Acute respiratory failure with hypoxia ([place]) 06/10/2020  . Nausea vomiting and diarrhea 06/14/2019  . AKI (acute kidney injury) ([place]) 06/14/2019  . CAP (community acquired pneumonia) 02/13/2018  . Dizziness 02/12/2018  . Autoimmune hepatitis ([place]) 11/18/2017  . Essential hypertension 11/18/2017  . Class 3 severe obesity due to excess calories with serious comorbidity and body mass index (BMI) of 40.0 to  44.9 in adult ([place]) 11/18/2017  . History of depression 11/18/2017  . GERD without esophagitis 11/18/2017  . Dyslipidemia 11/18/2017  . Vitamin D deficiency 11/18/2017  . History of migraine 11/18/2017  . History of meningioma 11/18/2017  . DDD (degenerative disc disease), cervical 11/18/2017  . DDD (degenerative disc disease), lumbar 11/18/2017  . Acute diverticulitis 06/07/2017  . Hypokalemia 06/07/2017  . Hypomagnesemia 06/07/2017  . Tobacco use disorder 06/07/2017  . Atelectasis of both lungs 06/07/2017  . Acute maxillary sinusitis 10/13/2016  . Abnormal LFTs   . Pain in the chest   . Controlled type 2 diabetes mellitus with diabetic polyneuropathy, with long-term current use of insulin ([place])   . Hypoxia 12/29/2015  . Uncontrolled type 2 diabetes mellitus ([place]) 12/29/2015  . Dyspnea 12/29/2015  . LLQ pain 12/29/2015  . Lupus ([place]) 12/29/2015  . Chest pain 12/29/2015  . Transaminitis 12/29/2015  . Chronic pain 12/29/2015  . Depression 12/29/2015    Past Surgical History:  Procedure Laterality Date  . CHOLECYSTECTOMY    . DILATION AND CURETTAGE OF UTERUS    . EYE SURGERY Bilateral    laser surgery   . IR TRANSCATHETER BX  11/03/2018  . IR US GUIDE VASC ACCESS RIGHT  11/03/2018  . IR VENOGRAM HEPATIC W HEMODYNAMIC EVALUATION  11/03/2018     OB History   No obstetric history on file.     Family History  Problem Relation Age of Onset  . Diabetes Father   . Hypertension Father   . Hypertension Mother   . Kidney cancer Maternal Uncle   . Cancer Maternal Uncle        brain    Social History   Tobacco Use  . Smoking status: Current Every Day Smoker    Packs/day: 1.00    Years: 30.00    Pack years: 30.00    Types: Cigarettes  . Smokeless tobacco: Never Used  Vaping Use  . Vaping Use: Never used  Substance Use Topics  . Alcohol use: No  . Drug use: No    Home Medications Prior to Admission medications   Medication Sig Start Date End Date Taking?  Authorizing Provider  ascorbic acid (VITAMIN C) 500 MG tablet Take 2,000 mg by mouth daily.    [provider]  buPROPion (WELLBUTRIN XL) 150 MG 24 hr tablet Take 150 mg by mouth daily. 05/21/20   [provider]  Cholecalciferol (VITAMIN D PO) Take 1 tablet by mouth every evening.     [provider]  DULoxetine (CYMBALTA) 60 MG capsule Take 60 mg by mouth at bedtime.  08/26/16   [provider]  fexofenadine-pseudoephedrine (ALLEGRA-D 24) 180-240 MG 24 hr tablet Take 1 tablet by mouth daily.    [provider]  furosemide (LASIX) 40 MG tablet Take 1 tablet (40 mg total) by mouth 2 (two) times daily. 06/17/20   Domenic Polite, MD  insulin lispro protamine-lispro (HUMALOG 75/25 MIX) (75-25) 100 UNIT/ML SUSP injection Inject 120 Units into the skin 3 (three) times daily with meals. 06/17/20   Domenic Polite, MD  lactulose (CHRONULAC) 10 GM/15ML solution Take 30 mLs by mouth 3 (three) times daily as needed for constipation. 11/22/19   [provider]  magnesium 30 MG tablet Take 30 mg by mouth at bedtime.     [provider]  Multiple Vitamin (MULTIVITAMIN) tablet Take 1 tablet by mouth daily.     [provider]  mycophenolate (MYFORTIC) 360 MG TBEC EC tablet Take 720 mg by mouth 2 (two) times daily. 05/13/20   [provider]  nadolol (CORGARD) 40 MG tablet Take 1 tablet (40 mg total) by mouth 2 (two) times daily. 06/17/20   Domenic Polite, MD  NASAL SALINE NA Place 1 spray into the nose daily as needed (Congestion and Allergies).    [provider]  Omega-3 Fatty Acids (FISH OIL PO) Take 1 capsule by mouth every evening.     [provider]  Oxycodone HCl 10 MG TABS Take 0.5 tablets (5 mg total) by mouth every 6 (six) hours as needed (pain). 06/17/20   Domenic Polite, MD  potassium chloride SA (KLOR-CON) 20 MEQ tablet Take 20 mEq by mouth daily. 03/12/20   [provider]  predniSONE (DELTASONE) 5  MG tablet Take 12.5 mg by mouth daily with breakfast.     [provider]  spironolactone (ALDACTONE) 25 MG tablet Take 1 tablet (25 mg total) by mouth daily. 06/18/20   Domenic Polite, MD  TURMERIC PO Take 400 mg by mouth daily.     [provider]  zolpidem (AMBIEN) 10 MG tablet Take 10 mg by mouth at bedtime. 05/15/19   [provider]    Allergies    Ace inhibitors, Imuran [azathioprine], Lisinopril, Protonix [pantoprazole], Tramadol, Iodinated diagnostic agents, Iodine, Janumet [sitagliptin-metformin hcl], Omeprazole, and Victoza [liraglutide]  Review of Systems   Review of Systems  Constitutional: Positive for fever.  Respiratory: Positive for cough and shortness of breath.   All other systems reviewed and are negative.   Physical Exam Updated Vital Signs BP 137/77   Pulse 64   Temp 99 F (37.2 C) (Oral)   Resp (!) 24   Ht _0  (1.651 m)   Wt (!) 149.7 kg   SpO2 95%   BMI 54.91 kg/m   Physical Exam Vitals and nursing note reviewed.  Constitutional:      Appearance: She is well-developed.     Comments: obese  HENT:     Head: Normocephalic and atraumatic.  Eyes:     Conjunctiva/sclera: Conjunctivae normal.     Pupils: Pupils are equal, round, and reactive to light.  Cardiovascular:     Rate and Rhythm: Normal rate and regular rhythm.     Heart sounds: Normal heart sounds.  Pulmonary:     Effort: Pulmonary effort is normal.     Breath sounds: Normal breath sounds. No wheezing or rhonchi.     Comments: NRB in use, O2 sats 97%, winded with minimal movement and conversation Abdominal:     General: Bowel sounds are normal.     Palpations: Abdomen is soft.  Musculoskeletal:        General: Normal range of motion.     Cervical back: Normal range of motion.     Comments: 1+ pitting edema BLE and feet, no erythema noted  Skin:    General: Skin is warm and dry.  Neurological:     Mental Status: She is alert and oriented to person, place, and  time.     ED Results / Procedures / Treatments   Labs (all labs ordered are listed, but only abnormal results are displayed) Labs Reviewed  CBC WITH DIFFERENTIAL/PLATELET - Abnormal; Notable for the following components:      Result Value   WBC 11.5 (*)    RBC 3.76 (*)    MCV 103.2 (*)    nRBC 0.6 (*)    Neutro Abs 8.0 (*)    Abs Immature Granulocytes 0.10 (*)    All other components within normal limits  COMPREHENSIVE METABOLIC PANEL - Abnormal; Notable for the following components:   Potassium 3.1 (*)    Glucose, Bld 60 (*)    Calcium 8.5 (*)    Total Protein 10.9 (*)    Albumin 3.2 (*)    AST 111 (*)    ALT 186 (*)    All other components within normal limits  BRAIN NATRIURETIC PEPTIDE - Abnormal; Notable for the following components:   B Natriuretic Peptide 167.7 (*)    All other components within normal limits  SARS CORONAVIRUS 2 BY RT PCR (HOSPITAL ORDER, [facility])  CULTURE, BLOOD (ROUTINE X 2)  CULTURE, BLOOD (ROUTINE X 2)  URINE CULTURE  LACTIC ACID, PLASMA  URINALYSIS, ROUTINE W REFLEX MICROSCOPIC  TROPONIN I (HIGH SENSITIVITY)    EKG EKG Interpretation  Date/Time:  Thursday August 28 2020 01:36:05 EDT Ventricular Rate:  78 PR Interval:    QRS Duration: 110 QT Interval:  384 QTC Calculation: 435 R Axis:   81 Text Interpretation: Sinus rhythm Borderline right axis deviation Baseline wander When compared with ECG of 06/10/2020, No significant change was found Confirmed by Delora Fuel ([id number]) on 08/28/2020 1:49:55 AM   Radiology DG Chest Port 1 View  Result Date: 08/28/2020 CLINICAL DATA:  Shortness of breath and fevers EXAM: PORTABLE CHEST 1 VIEW COMPARISON:  06/10/2020 FINDINGS: Cardiac shadow is  prominent but stable. Aortic calcifications are noted. Bibasilar infiltrates are noted new from the prior exam. No sizable effusion is seen. No bony abnormality is noted. IMPRESSION: Bibasilar airspace opacities.  Correlate with  COVID-19 testing. Electronically Signed   By: Inez Catalina M.D.   On: 08/28/2020 02:14    Procedures Procedures (including critical care time)  CRITICAL CARE Performed by: Larene Pickett   Total critical care time: 40 minutes  Critical care time was exclusive of separately billable procedures and treating other patients.  Critical care was necessary to treat or prevent imminent or life-threatening deterioration.  Critical care was time spent personally by me on the following activities: development of treatment plan with patient and/or surrogate as well as nursing, discussions with consultants, evaluation of patient's response to treatment, examination of patient, obtaining history from patient or surrogate, ordering and performing treatments and interventions, ordering and review of laboratory studies, ordering and review of radiographic studies, pulse oximetry and re-evaluation of patient's condition.   Medications Ordered in ED Medications  ceFEPIme (MAXIPIME) 2 g in sodium chloride 0.9 % 100 mL IVPB (0 g Intravenous Stopped 08/28/20 0425)  vancomycin (VANCOCIN) 2,500 mg in sodium chloride 0.9 % 500 mL IVPB (2,500 mg Intravenous New Bag/Given 08/28/20 0403)  vancomycin (VANCOREADY) IVPB 1250 mg/250 mL (has no administration in time range)    ED Course  I have reviewed the triage vital signs and the nursing notes.  Pertinent labs & imaging results that were available during my care of the patient were reviewed by me and considered in my medical decision making (see chart for details).    MDM Rules/Calculators/A&P  59 y.o. F here with SOB.  1 week of body aches, fatigue, productive cough and SOB began yesterday.  She has not been vaccinated against covid-19-- PCP encouraged her not to because of her lupus and other autoimmune disease.  Febrile with EMS and was given Tylenol.  Remains warm to touch on arrival to ED.  She is currently on nonrebreather and maintaining saturations in the  upper 90s.  She is alert and oriented, does not appear confused.  Labs pending along with chest x-ray and Covid test.  Will hold on IV fluids pending results.  CXR with bibasilar opacities.  Labs grossly reassuring--  WBC count 11.5, normal lactate.  Does have elevated LFT's, however this is chronic and consistent with prior values.  covid screen is negative.  Patient remains on NRB, saturations stable in 90's currently.  Remains AAOx3.  Will start broad spectrum abx and admit for ongoing care.  As she is on immunosuppressants, will start vanc/cefepime.  Spoke with hospitalist, Dr. Hal Hope-- will admit for ongoing care.  Final Clinical Impression(s) / ED Diagnoses Final diagnoses:  Pneumonia of both lower lobes due to infectious organism  Acute on chronic respiratory failure with hypoxia [facility])    Rx / DC Orders ED Discharge Orders    None       Larene Pickett, PA-C 86/16/83 [id number]    Delora Fuel, MD 21/11/55 [id number]

## 2020-08-29 LAB — URINE CULTURE: Culture: NO GROWTH

## 2020-08-29 LAB — BASIC METABOLIC PANEL WITH GFR
Anion gap: 7 (ref 5–15)
BUN: 26 mg/dL — ABNORMAL HIGH (ref 8–23)
CO2: 28 mmol/L (ref 22–32)
Calcium: 7.9 mg/dL — ABNORMAL LOW (ref 8.9–10.3)
Chloride: 101 mmol/L (ref 98–111)
Creatinine, Ser: 0.99 mg/dL (ref 0.44–1.00)
GFR calc Af Amer: >60 mL/min
GFR calc non Af Amer: >60 mL/min
Glucose, Bld: 175 mg/dL — ABNORMAL HIGH (ref 70–99)
Potassium: 3.6 mmol/L (ref 3.5–5.1)
Sodium: 136 mmol/L (ref 135–145)

## 2020-08-29 LAB — CBC
HCT: 35.4 % — ABNORMAL LOW (ref 36.0–46.0)
Hemoglobin: 11.2 g/dL — ABNORMAL LOW (ref 12.0–15.0)
MCH: 32.3 pg (ref 26.0–34.0)
MCHC: 31.6 g/dL (ref 30.0–36.0)
MCV: 102 fL — ABNORMAL HIGH (ref 80.0–100.0)
Platelets: 183 10*3/uL (ref 150–400)
RBC: 3.47 MIL/uL — ABNORMAL LOW (ref 3.87–5.11)
RDW: 15.1 % (ref 11.5–15.5)
WBC: 11.6 10*3/uL — ABNORMAL HIGH (ref 4.0–10.5)
nRBC: 0 % (ref 0.0–0.2)

## 2020-08-29 LAB — GLUCOSE, CAPILLARY
Glucose-Capillary: 147 mg/dL — ABNORMAL HIGH (ref 70–99)
Glucose-Capillary: 197 mg/dL — ABNORMAL HIGH (ref 70–99)
Glucose-Capillary: 222 mg/dL — ABNORMAL HIGH (ref 70–99)
Glucose-Capillary: 233 mg/dL — ABNORMAL HIGH (ref 70–99)
Glucose-Capillary: 227 mg/dL — ABNORMAL HIGH (ref 70–99)

## 2020-08-29 MED ORDER — HYOSCYAMINE SULFATE 0.125 MG PO TABS
0.1250 mg | ORAL_TABLET | ORAL | Status: DC | PRN
  Filled 2020-08-29: qty 1

## 2020-08-29 MED ORDER — PREDNISONE 5 MG PO TABS
12.5000 mg | ORAL_TABLET | Freq: Every day | ORAL | Status: DC
  Administered 2020-08-30 – 2020-08-31 (×2): 12.5 mg via ORAL
  Filled 2020-08-29 (×2): qty 1

## 2020-08-29 MED ORDER — NADOLOL 40 MG PO TABS
80.0000 mg | ORAL_TABLET | Freq: Two times a day (BID) | ORAL | Status: DC
  Administered 2020-08-29 – 2020-09-05 (×12): 80 mg via ORAL
  Filled 2020-08-29 (×15): qty 2

## 2020-08-29 MED ORDER — IPRATROPIUM-ALBUTEROL 0.5-2.5 (3) MG/3ML IN SOLN
3.0000 mL | Freq: Three times a day (TID) | RESPIRATORY_TRACT | Status: DC
  Administered 2020-08-29 (×2): 3 mL via RESPIRATORY_TRACT
  Filled 2020-08-29 (×4): qty 3

## 2020-08-29 MED ORDER — HYDROCODONE-ACETAMINOPHEN 5-325 MG PO TABS
1.0000 | ORAL_TABLET | Freq: Four times a day (QID) | ORAL | Status: DC | PRN
  Administered 2020-08-29: 1 via ORAL
  Filled 2020-08-29: qty 1

## 2020-08-29 MED ORDER — AMLODIPINE BESYLATE 10 MG PO TABS
10.0000 mg | ORAL_TABLET | Freq: Every day | ORAL | Status: DC
  Administered 2020-08-29 – 2020-09-05 (×8): 10 mg via ORAL
  Filled 2020-08-29 (×8): qty 1

## 2020-08-29 NOTE — Progress Notes (Signed)
Pt arrived on the floor from ED A&Ox4 on 5L  Non rebreather. Pt had a red jacket, purse, cell phoneand 1pair of leopard printed slipper and a 1 purple slipper.  Pt had no c/o of pain and meal was order for dinner.

## 2020-08-29 NOTE — TOC Progression Note (Signed)
Transition of Care [facility]) - Progression Note    Patient Details  Name: Monica Ellis MRN: [id number] Date of Birth: 10/05/1961  Transition of Care [facility]) CM/SW [place], RN Phone Number: [phone]  08/29/2020, 10:02 AM  Clinical Narrative:    Patient is active with [place] person Pricilla Handler [phone].         Expected Discharge Plan and Services                                                 Social Determinants of Health (SDOH) Interventions    Readmission Risk Interventions No flowsheet data found.

## 2020-08-29 NOTE — Progress Notes (Signed)
PROGRESS NOTE    Monica Ellis  TDD:[id number] DOB: Jan 03, 1961 DOA: 08/28/2020 PCP: Ferd Hibbs, NP   Brief Narrative:  Monica Ellis is a 59 y.o. female with history of autoimmune hepatitis/lupus, morbid obesity, diabetes mellitus type 2, tobacco abuse, obesity hypoventilation syndrome/COPD presents to the ER because of increasing shortness of breath over the last 2 days.  Patient states she has become progressively short of breath over the last few days but in the last 2 days with some productive cough.  Has been some chest pressure and also has gained about 100 pounds per the last few months as per the patient.  Patient was just recently discharged 2 months ago after being admitted for respiratory failure at the time patient was diuresed. In the ER patient's troponins were negative BNP was 167 Covid test was negative chest x-ray shows bilateral infiltrates patient was hypoxic requiring nonrebreather mildly lethargic but answering questions appropriately.  Patient's labs are showing mildly elevated LFTs consistent with her patient's known history of autoimmune hepatitis.  Since chest x-ray showing bilateral infiltrates patient was started on empiric antibiotics for pneumonia and admitted for further management.   Assessment & Plan:   Principal Problem:   Acute on chronic respiratory failure with hypoxia (HCC) Active Problems:   Uncontrolled type 2 diabetes mellitus (HCC)   Lupus (HCC)   Autoimmune hepatitis ([place])   Essential hypertension   Class 3 severe obesity due to excess calories with serious comorbidity and body mass index (BMI) of 40.0 to 44.9 in adult [facility])   Acute on chronic respiratory failure with hypoxia and hypercarbia, POA In the setting of volume overload/pulmonary edema, improving Likely concurrent obstructive sleep apnea and obesity hypoventilation, POA - Lower extremity edema, orthopnea, bibasilar rales all consistent with volume overload; recent  history of similar episode in presentation with resolution on diuretics - Restrict diet, fluid restrict and salt restriction to improve fluid mobility - Continue Lasix 60 IV twice daily, creatinine stable - Last EF measured in July 2021 was 60 to 62% without systolic or diastolic dysfunction.   - Procalcitonin negative, discontinue antibiotics -Continue to wean oxygen down to baseline which appears to be 2 to 3 L nasal cannula around-the-clock -Patient admits to morning headaches, somnolence at baseline and feeling poorly rested consistent with productive sleep apnea, previously unable to follow-up for sleep study, will offer CPAP here at night and as needed with sleeping, recommend close monitoring with PCP and pulmonology for sleep study as soon as can be scheduled. SpO2: 92 % O2 Flow Rate (L/min): 2 L/min  Autoimmune hepatitis with elevated LFTs on mycophenolate and prednisone.  Currently on IV Solu-Medrol.  Diabetes mellitus type 2  Decreasing patient's NovoLog 70/30 from 120 units to 90 units 3 times daily for now until we make sure patient blood sugar stable.   Closely follow CBGs at patient is also on steroids. Continue hypoglycemic protocol, sliding scale insulin Lab Results  Component Value Date   HGBA1C 6.2 (H) 08/28/2020   History of lupus  Continue mycophenolate, prednisone, discontinue methylprednisolone as above  COPD, not in acute exacerbation  Continue nebulizer and steroids.  Morbid obesity Estimated body mass index is 54.92 kg/m as calculated from the following:   Height as of this encounter: _0  (1.651 m).   Weight as of this encounter: 149.7 kg. Lengthy discussion at bedside on need for dietary and lifestyle changes Likely exacerbating patient's above hypoxia with obesity hypoventilation syndrome as well as likely obstructive sleep apnea   DVT prophylaxis:  Lovenox. Code Status: Full code. Family Communication: Daughter at bedside  Status is:  Patient  Dispo: The patient is from: Home              Anticipated d/c is to: To be determined              Anticipated d/c date is: 72 to 96 hours pending clinical course              Patient currently not medically stable for discharge in the setting of acute hypoxic hypercarbic respiratory failure volume overload need for close monitoring and supplemental oxygen well above baseline  Consultants:   None  Procedures:   None  Antimicrobials:  Azithromycin/ceftriaxone discontinued 08/28/2020  Subjective: No acute issues or events overnight, patient indicates headache and feeling unrested consistent with sleep apnea and respiratory problems, this appears to be somewhat chronic for her.  He otherwise denies chest pain, nausea, vomiting, diarrhea, constipation, fevers, chills.  Shortness of breath is ongoing but markedly improved, orthopnea somewhat improved from admission.  Objective: Vitals:   08/28/20 2333 08/29/20 0500 08/29/20 0819 08/29/20 0827  BP: (!) 141/73   132/76  Pulse: 64   75  Resp: 20   (!) 22  Temp: 98 F (36.7 C)   99.2 F (37.3 C)  TempSrc:      SpO2: 99%  92%   Weight:  (!) 149.7 kg    Height:        Intake/Output Summary (Last 24 hours) at 08/29/2020 1212 Last data filed at 08/28/2020 1920 Gross per 24 hour  Intake 220 ml  Output --  Net 220 ml   Filed Weights   08/28/20 0135 08/29/20 0500  Weight: (!) 149.7 kg (!) 149.7 kg    Examination:  General:  Pleasantly resting in bed, No acute distress. HEENT:  Normocephalic atraumatic.  Sclerae nonicteric, noninjected.  Extraocular movements intact bilaterally. Neck:  Without mass or deformity.  Trachea is midline. Lungs: Bibasilar rales without overt rhonchi or wheeze Heart:  Regular rate and rhythm.  Without murmurs, rubs, or gallops. Abdomen:  Soft, nontender, nondistended.  Without guarding or rebound. Extremities: 2+ pitting edema bilateral lower extremity Vascular:  Dorsalis pedis and posterior  tibial pulses palpable bilaterally. Skin:  Warm and dry, no erythema, no ulcerations.  Data Reviewed: I have personally reviewed following labs and imaging studies  CBC: Recent Labs  Lab 08/28/20 0155 08/28/20 0611 08/28/20 0647 08/29/20 0111  WBC 11.5*  --  11.6* 11.6*  NEUTROABS 8.0*  --   --   --   HGB 12.2 13.6 11.7* 11.2*  HCT 38.8 40.0 38.3 35.4*  MCV 103.2*  --  103.0* 102.0*  PLT 226  --  212 071   Basic Metabolic Panel: Recent Labs  Lab 08/28/20 0155 08/28/20 0611 08/28/20 0647 08/29/20 0111  NA 136 145  --  136  K 3.1* 3.4*  --  3.6  CL 101  --   --  101  CO2 26  --   --  28  GLUCOSE 60*  --   --  175*  BUN 15  --   --  26*  CREATININE 0.93  --  0.91 0.99  CALCIUM 8.5*  --   --  7.9*   GFR: Estimated Creatinine Clearance: 88.6 mL/min (by C-G formula based on SCr of 0.99 mg/dL). Liver Function Tests: Recent Labs  Lab 08/28/20 0155  AST 111*  ALT 186*  ALKPHOS 41  BILITOT 0.9  PROT 10.9*  ALBUMIN 3.2*   No results for input(s): LIPASE, AMYLASE in the last 168 hours. No results for input(s): AMMONIA in the last 168 hours. Coagulation Profile: No results for input(s): INR, PROTIME in the last 168 hours. Cardiac Enzymes: No results for input(s): CKTOTAL, CKMB, CKMBINDEX, TROPONINI in the last 168 hours. BNP (last 3 results) No results for input(s): PROBNP in the last 8760 hours. HbA1C: Recent Labs    08/28/20 0647  HGBA1C 6.2*   CBG: Recent Labs  Lab 08/28/20 1722 08/28/20 2141 08/29/20 0751 08/29/20 0936 08/29/20 1209  GLUCAP 225* 228* 147* 197* 222*   Lipid Profile: No results for input(s): CHOL, HDL, LDLCALC, TRIG, CHOLHDL, LDLDIRECT in the last 72 hours. Thyroid Function Tests: Recent Labs    08/28/20 0647  TSH 0.736   Anemia Panel: No results for input(s): VITAMINB12, FOLATE, FERRITIN, TIBC, IRON, RETICCTPCT in the last 72 hours. Sepsis Labs: Recent Labs  Lab 08/28/20 0155 08/28/20 0647  PROCALCITON  --  <0.10   LATICACIDVEN 0.9  --     Recent Results (from the past 240 hour(s))  SARS Coronavirus 2 by RT PCR (hospital order, performed in [facility] hospital lab) Nasopharyngeal Nasopharyngeal Swab     Status: None   Collection Time: 08/28/20  1:38 AM   Specimen: Nasopharyngeal Swab  Result Value Ref Range Status   SARS Coronavirus 2 NEGATIVE NEGATIVE Final    Comment: (NOTE) SARS-CoV-2 target nucleic acids are NOT DETECTED.  The SARS-CoV-2 RNA is generally detectable in upper and lower respiratory specimens during the acute phase of infection. The lowest concentration of SARS-CoV-2 viral copies this assay can detect is 250 copies / mL. A negative result does not preclude SARS-CoV-2 infection and should not be used as the sole basis for treatment or other patient management decisions.  A negative result may occur with improper specimen collection / handling, submission of specimen other than nasopharyngeal swab, presence of viral mutation(s) within the areas targeted by this assay, and inadequate number of viral copies (<250 copies / mL). A negative result must be combined with clinical observations, patient history, and epidemiological information.  Fact Sheet for Patients:   StrictlyIdeas.no  Fact Sheet for Healthcare Providers: BankingDealers.co.za  This test is not yet approved or  cleared by the [place] FDA and has been authorized for detection and/or diagnosis of SARS-CoV-2 by FDA under an Emergency Use Authorization (EUA).  This EUA will remain in effect (meaning this test can be used) for the duration of the COVID-19 declaration under Section 564(b)(1) of the Act, 21 U.S.C. section 360bbb-3(b)(1), unless the authorization is terminated or revoked sooner.  Performed at [facility], [place] [address], [place], [place] [address]   Blood culture (routine x 2)     Status: None (Preliminary result)   Collection Time:  08/28/20  1:55 AM   Specimen: BLOOD  Result Value Ref Range Status   Specimen Description BLOOD SITE NOT SPECIFIED  Final   Special Requests   Final    BOTTLES DRAWN AEROBIC AND ANAEROBIC Blood Culture adequate volume   Culture   Final    NO GROWTH < 12 HOURS Performed at [facility], [place] [address] [place], [place] [address]    Report Status PENDING  Incomplete  Urine culture     Status: None   Collection Time: 08/28/20  1:55 AM   Specimen: Urine, Random  Result Value Ref Range Status   Specimen Description URINE, RANDOM  Final   Special Requests  NONE  Final   Culture   Final    NO GROWTH Performed at [facility], [place] [address] [place], [place] [address]    Report Status 08/29/2020 FINAL  Final  Blood culture (routine x 2)     Status: None (Preliminary result)   Collection Time: 08/28/20  3:15 AM   Specimen: BLOOD RIGHT HAND  Result Value Ref Range Status   Specimen Description BLOOD RIGHT HAND  Final   Special Requests   Final    BOTTLES DRAWN AEROBIC AND ANAEROBIC Blood Culture adequate volume   Culture   Final    NO GROWTH < 12 HOURS Performed at [facility], [place] [address], [place],  [address]    Report Status PENDING  Incomplete         Radiology Studies: DG Chest Port 1 View  Result Date: 08/28/2020 CLINICAL DATA:  Shortness of breath and fevers EXAM: PORTABLE CHEST 1 VIEW COMPARISON:  06/10/2020 FINDINGS: Cardiac shadow is prominent but stable. Aortic calcifications are noted. Bibasilar infiltrates are noted new from the prior exam. No sizable effusion is seen. No bony abnormality is noted. IMPRESSION: Bibasilar airspace opacities.  Correlate with COVID-19 testing. Electronically Signed   By: Inez Catalina M.D.   On: 08/28/2020 02:14        Scheduled Meds: . amLODipine  10 mg Oral Daily  . buPROPion  150 mg Oral Daily  . DULoxetine  60 mg Oral QHS  . enoxaparin (LOVENOX) injection  75 mg Subcutaneous Q24H  . furosemide  60  mg Intravenous Q12H  . insulin aspart  0-6 Units Subcutaneous TID WC  . insulin aspart protamine- aspart  90 Units Subcutaneous TID AC  . ipratropium-albuterol  3 mL Nebulization TID  . lactulose  20 g Oral TID  . magnesium oxide  200 mg Oral QHS  . mycophenolate  720 mg Oral BID  . nadolol  40 mg Oral BID  . nadolol  80 mg Oral BID  . omega-3 acid ethyl esters  1 g Oral Daily  . potassium chloride SA  20 mEq Oral Daily  . [START ON 08/30/2020] predniSONE  12.5 mg Oral Q breakfast  . spironolactone  25 mg Oral Daily     LOS: 1 day   Time spent: 63mn  Rasheen Bells C Lawsen Arnott, DO [facility]  If 7PM-7AM, please contact night-coverage [email or web address]  08/29/2020, 12:12 PM

## 2020-08-30 ENCOUNTER — Inpatient Hospital Stay (HOSPITAL_COMMUNITY)

## 2020-08-30 LAB — GLUCOSE, CAPILLARY
Glucose-Capillary: 161 mg/dL — ABNORMAL HIGH (ref 70–99)
Glucose-Capillary: 182 mg/dL — ABNORMAL HIGH (ref 70–99)
Glucose-Capillary: 227 mg/dL — ABNORMAL HIGH (ref 70–99)
Glucose-Capillary: 186 mg/dL — ABNORMAL HIGH (ref 70–99)

## 2020-08-30 LAB — BASIC METABOLIC PANEL
Anion gap: 9 (ref 5–15)
BUN: 28 mg/dL — ABNORMAL HIGH (ref 8–23)
CO2: 28 mmol/L (ref 22–32)
Calcium: 7.8 mg/dL — ABNORMAL LOW (ref 8.9–10.3)
Chloride: 97 mmol/L — ABNORMAL LOW (ref 98–111)
Creatinine, Ser: 0.87 mg/dL (ref 0.44–1.00)
GFR calc Af Amer: >60 mL/min (ref >60)
GFR calc non Af Amer: >60 mL/min (ref >60)
Glucose, Bld: 124 mg/dL — ABNORMAL HIGH (ref 70–99)
Potassium: 3.4 mmol/L — ABNORMAL LOW (ref 3.5–5.1)
Sodium: 134 mmol/L — ABNORMAL LOW (ref 135–145)

## 2020-08-30 LAB — CBC
HCT: 33.8 % — ABNORMAL LOW (ref 36.0–46.0)
Hemoglobin: 10.7 g/dL — ABNORMAL LOW (ref 12.0–15.0)
MCH: 32.4 pg (ref 26.0–34.0)
MCHC: 31.7 g/dL (ref 30.0–36.0)
MCV: 102.4 fL — ABNORMAL HIGH (ref 80.0–100.0)
Platelets: 196 10*3/uL (ref 150–400)
RBC: 3.3 MIL/uL — ABNORMAL LOW (ref 3.87–5.11)
RDW: 14.7 % (ref 11.5–15.5)
WBC: 10.4 10*3/uL (ref 4.0–10.5)
nRBC: 0.3 % — ABNORMAL HIGH (ref 0.0–0.2)

## 2020-08-30 MED ORDER — POTASSIUM CHLORIDE CRYS ER 20 MEQ PO TBCR
40.0000 meq | EXTENDED_RELEASE_TABLET | Freq: Once | ORAL | Status: AC
  Administered 2020-08-30: 40 meq via ORAL
  Filled 2020-08-30: qty 2

## 2020-08-30 MED ORDER — ALPRAZOLAM 0.25 MG PO TABS
0.2500 mg | ORAL_TABLET | Freq: Two times a day (BID) | ORAL | Status: DC | PRN
  Administered 2020-08-30 – 2020-09-04 (×3): 0.25 mg via ORAL
  Filled 2020-08-30 (×3): qty 1

## 2020-08-30 MED ORDER — FUROSEMIDE 10 MG/ML IJ SOLN
80.0000 mg | Freq: Two times a day (BID) | INTRAMUSCULAR | Status: DC
  Administered 2020-08-30 – 2020-09-01 (×4): 80 mg via INTRAVENOUS
  Filled 2020-08-30 (×4): qty 8

## 2020-08-30 MED ORDER — IPRATROPIUM-ALBUTEROL 0.5-2.5 (3) MG/3ML IN SOLN: 3.0000 mL | Freq: Four times a day (QID) | RESPIRATORY_TRACT | Status: DC | PRN

## 2020-08-30 NOTE — Progress Notes (Signed)
Called to PT room, in Moderate resp distress. Placed PT on BiPAP per prn order. Pt tollerating well at this time,

## 2020-08-30 NOTE — Progress Notes (Addendum)
PROGRESS NOTE    Monica Ellis  NTI:[id number] DOB: 06-29-1961 DOA: 08/28/2020 PCP: Ferd Hibbs, NP   Brief Narrative:  Monica Ellis is a 59 y.o. female with history of autoimmune hepatitis/lupus, morbid obesity, diabetes mellitus type 2, tobacco abuse, obesity hypoventilation syndrome/COPD presents to the ER because of increasing shortness of breath over the last 2 days.  Patient states she has become progressively short of breath over the last few days but in the last 2 days with some productive cough.  Has been some chest pressure and also has gained about 100 pounds per the last few months as per the patient.  Patient was just recently discharged 2 months ago after being admitted for respiratory failure at the time patient was diuresed. In the ER patient's troponins were negative BNP was 167 Covid test was negative chest x-ray shows bilateral infiltrates patient was hypoxic requiring nonrebreather mildly lethargic but answering questions appropriately.  Patient's labs are showing mildly elevated LFTs consistent with her patient's known history of autoimmune hepatitis.  Since chest x-ray showing bilateral infiltrates patient was started on empiric antibiotics for pneumonia and admitted for further management.   Assessment & Plan:   Principal Problem:   Acute on chronic respiratory failure with hypoxia (HCC) Active Problems:   Uncontrolled type 2 diabetes mellitus (HCC)   Lupus (HCC)   Autoimmune hepatitis ([place])   Essential hypertension   Class 3 severe obesity due to excess calories with serious comorbidity and body mass index (BMI) of 40.0 to 44.9 in adult [facility])   Acute on chronic respiratory failure with hypoxia and hypercarbia, POA In the setting of volume overload/pulmonary edema, improving Likely concurrent obstructive sleep apnea and obesity hypoventilation, POA - Lower extremity edema, orthopnea, bibasilar rales all consistent with volume overload; recent  history of similar episode in presentation with resolution on diuretics - Restrict diet, fluid restrict and salt restriction to improve fluid mobility - Increase lasix 80 IV twice daily, creatinine stable - Repeat chest x-ray still appears to be quite edematous - Last EF measured in July 2021 was 60 to 86% without systolic or diastolic dysfunction.   - Procalcitonin negative, discontinue antibiotics, remains afebrile -Continue to wean oxygen down to baseline which appears to be 2 to 3 L nasal cannula around-the-clock -Patient admits to morning headaches, somnolence at baseline and feeling poorly rested consistent with productive sleep apnea, previously unable to follow-up for sleep study, will offer CPAP here at night and as needed with sleeping, recommend close monitoring with PCP and pulmonology for sleep study as soon as can be scheduled. -Offered very low-dose anxiolytic for BiPAP tolerance SpO2: 91 % O2 Flow Rate (L/min): 15 L/min  Autoimmune hepatitis with elevated LFTs on mycophenolate and prednisone.  Continue home medications Off methylprednisolone given unlikely COPD or infectious process given above  Diabetes mellitus type 2  Decreasing patient's NovoLog 70/30 from 120 units to 90 units 3 times daily for now until we make sure patient blood sugar stable.   Closely follow CBGs at patient is also on steroids. Continue hypoglycemic protocol, sliding scale insulin Lab Results  Component Value Date   HGBA1C 6.2 (H) 08/28/2020   History of lupus  Continue prednisone  COPD, not in acute exacerbation  Continue nebulizer and steroids.  Morbid obesity Estimated body mass index is 54.92 kg/m as calculated from the following:   Height as of this encounter: _0  (1.651 m).   Weight as of this encounter: 149.7 kg. Lengthy discussion at bedside on need for dietary  and lifestyle changes Likely exacerbating patient's above hypoxia with obesity hypoventilation syndrome as well as likely  obstructive sleep apnea   DVT prophylaxis: Lovenox. Code Status: Full code. Family Communication: Called patient's daughter x2, no answer  Status is: Patient  Dispo: The patient is from: Home              Anticipated d/c is to: To be determined              Anticipated d/c date is: 72 to 96 hours pending clinical course              Patient currently not medically stable for discharge in the setting of acute hypoxic hypercarbic respiratory failure volume overload need for close monitoring and supplemental oxygen well above baseline  Consultants:   None  Procedures:   None  Antimicrobials:  Azithromycin/ceftriaxone discontinued 08/28/2020  Subjective: Patient reports transient episode of epigastric or midsternal pressure this morning that lasted maybe a few seconds and resolved, EKG and vitals at the time were unremarkable.  She otherwise denies nausea, vomiting, diaphoresis.  Her dyspnea with exertion is ongoing, she was able to tolerate BiPAP somewhat poorly overnight but states that her respiratory status is improving on BiPAP.  Objective: Vitals:   08/29/20 1504 08/29/20 2157 08/30/20 0712 08/30/20 0804  BP:  135/75  (!) 144/78  Pulse:  70 67   Resp:  20 (!) 31   Temp:  98.9 F (37.2 C)  97.8 F (36.6 C)  TempSrc:  Oral  Axillary  SpO2: 92% 92% 91% 91%  Weight:      Height:       No intake or output data in the 24 hours ending 08/30/20 1156 Filed Weights   08/28/20 0135 08/29/20 0500  Weight: (!) 149.7 kg (!) 149.7 kg    Examination:  General:  Pleasantly resting in bed, No acute distress. HEENT:  Normocephalic atraumatic.  Sclerae nonicteric, noninjected.  Extraocular movements intact bilaterally. Neck:  Without mass or deformity.  Trachea is midline. Lungs: Bibasilar rales without overt rhonchi or wheeze Heart:  Regular rate and rhythm.  Without murmurs, rubs, or gallops. Abdomen:  Soft, nontender, nondistended.  Without guarding or rebound. Extremities:  2+ pitting edema bilateral lower extremity Vascular:  Dorsalis pedis and posterior tibial pulses palpable bilaterally. Skin:  Warm and dry, no erythema, no ulcerations.  Data Reviewed: I have personally reviewed following labs and imaging studies  CBC: Recent Labs  Lab 08/28/20 0155 08/28/20 0611 08/28/20 0647 08/29/20 0111 08/30/20 0457  WBC 11.5*  --  11.6* 11.6* 10.4  NEUTROABS 8.0*  --   --   --   --   HGB 12.2 13.6 11.7* 11.2* 10.7*  HCT 38.8 40.0 38.3 35.4* 33.8*  MCV 103.2*  --  103.0* 102.0* 102.4*  PLT 226  --  212 183 250   Basic Metabolic Panel: Recent Labs  Lab 08/28/20 0155 08/28/20 0611 08/28/20 0647 08/29/20 0111 08/30/20 0457  NA 136 145  --  136 134*  K 3.1* 3.4*  --  3.6 3.4*  CL 101  --   --  101 97*  CO2 26  --   --  28 28  GLUCOSE 60*  --   --  175* 124*  BUN 15  --   --  26* 28*  CREATININE 0.93  --  0.91 0.99 0.87  CALCIUM 8.5*  --   --  7.9* 7.8*   GFR: Estimated Creatinine Clearance: 100.9 mL/min (by C-G formula  based on SCr of 0.87 mg/dL). Liver Function Tests: Recent Labs  Lab 08/28/20 0155  AST 111*  ALT 186*  ALKPHOS 41  BILITOT 0.9  PROT 10.9*  ALBUMIN 3.2*   No results for input(s): LIPASE, AMYLASE in the last 168 hours. No results for input(s): AMMONIA in the last 168 hours. Coagulation Profile: No results for input(s): INR, PROTIME in the last 168 hours. Cardiac Enzymes: No results for input(s): CKTOTAL, CKMB, CKMBINDEX, TROPONINI in the last 168 hours. BNP (last 3 results) No results for input(s): PROBNP in the last 8760 hours. HbA1C: Recent Labs    08/28/20 0647  HGBA1C 6.2*   CBG: Recent Labs  Lab 08/29/20 0936 08/29/20 1209 08/29/20 1641 08/29/20 2135 08/30/20 0748  GLUCAP 197* 222* 233* 227* 161*   Lipid Profile: No results for input(s): CHOL, HDL, LDLCALC, TRIG, CHOLHDL, LDLDIRECT in the last 72 hours. Thyroid Function Tests: Recent Labs    08/28/20 0647  TSH 0.736   Anemia Panel: No results for  input(s): VITAMINB12, FOLATE, FERRITIN, TIBC, IRON, RETICCTPCT in the last 72 hours. Sepsis Labs: Recent Labs  Lab 08/28/20 0155 08/28/20 0647  PROCALCITON  --  <0.10  LATICACIDVEN 0.9  --     Recent Results (from the past 240 hour(s))  SARS Coronavirus 2 by RT PCR (hospital order, performed in [facility] hospital lab) Nasopharyngeal Nasopharyngeal Swab     Status: None   Collection Time: 08/28/20  1:38 AM   Specimen: Nasopharyngeal Swab  Result Value Ref Range Status   SARS Coronavirus 2 NEGATIVE NEGATIVE Final    Comment: (NOTE) SARS-CoV-2 target nucleic acids are NOT DETECTED.  The SARS-CoV-2 RNA is generally detectable in upper and lower respiratory specimens during the acute phase of infection. The lowest concentration of SARS-CoV-2 viral copies this assay can detect is 250 copies / mL. A negative result does not preclude SARS-CoV-2 infection and should not be used as the sole basis for treatment or other patient management decisions.  A negative result may occur with improper specimen collection / handling, submission of specimen other than nasopharyngeal swab, presence of viral mutation(s) within the areas targeted by this assay, and inadequate number of viral copies (<250 copies / mL). A negative result must be combined with clinical observations, patient history, and epidemiological information.  Fact Sheet for Patients:   StrictlyIdeas.no  Fact Sheet for Healthcare Providers: BankingDealers.co.za  This test is not yet approved or  cleared by the [place] FDA and has been authorized for detection and/or diagnosis of SARS-CoV-2 by FDA under an Emergency Use Authorization (EUA).  This EUA will remain in effect (meaning this test can be used) for the duration of the COVID-19 declaration under Section 564(b)(1) of the Act, 21 U.S.C. section 360bbb-3(b)(1), unless the authorization is terminated or revoked  sooner.  Performed at [facility], [place] [address], [place], [place] [address]   Blood culture (routine x 2)     Status: None (Preliminary result)   Collection Time: 08/28/20  1:55 AM   Specimen: BLOOD  Result Value Ref Range Status   Specimen Description BLOOD SITE NOT SPECIFIED  Final   Special Requests   Final    BOTTLES DRAWN AEROBIC AND ANAEROBIC Blood Culture adequate volume   Culture   Final    NO GROWTH < 12 HOURS Performed at [facility], [place] [address] [place], [place] [address]    Report Status PENDING  Incomplete  Urine culture     Status: None  Collection Time: 08/28/20  1:55 AM   Specimen: Urine, Random  Result Value Ref Range Status   Specimen Description URINE, RANDOM  Final   Special Requests NONE  Final   Culture   Final    NO GROWTH Performed at [facility], [address] [place], [place] [address]    Report Status 08/29/2020 FINAL  Final  Blood culture (routine x 2)     Status: None (Preliminary result)   Collection Time: 08/28/20  3:15 AM   Specimen: BLOOD RIGHT HAND  Result Value Ref Range Status   Specimen Description BLOOD RIGHT HAND  Final   Special Requests   Final    BOTTLES DRAWN AEROBIC AND ANAEROBIC Blood Culture adequate volume   Culture   Final    NO GROWTH < 12 HOURS Performed at [facility], [place] [address] [place], [place] [address]    Report Status PENDING  Incomplete         Radiology Studies: DG Chest Port 1 View  Result Date: 08/30/2020 CLINICAL DATA:  Hypoxia EXAM: PORTABLE CHEST 1 VIEW COMPARISON:  Two days ago FINDINGS: Cardiomegaly and vascular pedicle widening. Diffuse interstitial opacity with cephalized blood flow. Streaky and hazy density at the lung bases. No visible effusion or pneumothorax. IMPRESSION: Infiltrates at the lung bases. Superimposed edema is also suspected. Electronically Signed   By: Monte Fantasia M.D.   On: 08/30/2020 10:23        Scheduled Meds: . amLODipine  10  mg Oral Daily  . buPROPion  150 mg Oral Daily  . DULoxetine  60 mg Oral QHS  . enoxaparin (LOVENOX) injection  75 mg Subcutaneous Q24H  . furosemide  80 mg Intravenous Q12H  . insulin aspart  0-6 Units Subcutaneous TID WC  . insulin aspart protamine- aspart  90 Units Subcutaneous TID AC  . lactulose  20 g Oral TID  . magnesium oxide  200 mg Oral QHS  . mycophenolate  720 mg Oral BID  . nadolol  80 mg Oral BID  . omega-3 acid ethyl esters  1 g Oral Daily  . potassium chloride SA  20 mEq Oral Daily  . predniSONE  12.5 mg Oral Q breakfast  . spironolactone  25 mg Oral Daily     LOS: 2 days   Time spent: 45mn  Monica Trosper C Kashlynn Kundert, DO [facility]  If 7PM-7AM, please contact night-coverage [email or web address]  08/30/2020, 11:56 AM

## 2020-08-30 NOTE — Progress Notes (Signed)
Pt taken off bipap to eat her dinner but had increased wob and decreased spo2 on hfnc. Pt placed on NRB with improvement in about 10 minutes. Pt to remain on NRB while eating dinner then will go back on bipap when safe to do so to avoid aspiration. RN aware.

## 2020-08-31 ENCOUNTER — Inpatient Hospital Stay (HOSPITAL_COMMUNITY)

## 2020-08-31 LAB — BASIC METABOLIC PANEL
Anion gap: 7 (ref 5–15)
BUN: 22 mg/dL (ref 8–23)
CO2: 30 mmol/L (ref 22–32)
Calcium: 7.7 mg/dL — ABNORMAL LOW (ref 8.9–10.3)
Chloride: 97 mmol/L — ABNORMAL LOW (ref 98–111)
Creatinine, Ser: 0.83 mg/dL (ref 0.44–1.00)
GFR calc Af Amer: >60 mL/min (ref >60)
GFR calc non Af Amer: >60 mL/min (ref >60)
Glucose, Bld: 177 mg/dL — ABNORMAL HIGH (ref 70–99)
Potassium: 3.5 mmol/L (ref 3.5–5.1)
Sodium: 134 mmol/L — ABNORMAL LOW (ref 135–145)

## 2020-08-31 LAB — CBC
HCT: 33.1 % — ABNORMAL LOW (ref 36.0–46.0)
Hemoglobin: 10.3 g/dL — ABNORMAL LOW (ref 12.0–15.0)
MCH: 31 pg (ref 26.0–34.0)
MCHC: 31.1 g/dL (ref 30.0–36.0)
MCV: 99.7 fL (ref 80.0–100.0)
Platelets: 211 10*3/uL (ref 150–400)
RBC: 3.32 MIL/uL — ABNORMAL LOW (ref 3.87–5.11)
RDW: 14.7 % (ref 11.5–15.5)
WBC: 9.3 10*3/uL (ref 4.0–10.5)
nRBC: 0.2 % (ref 0.0–0.2)

## 2020-08-31 LAB — GLUCOSE, CAPILLARY
Glucose-Capillary: 195 mg/dL — ABNORMAL HIGH (ref 70–99)
Glucose-Capillary: 201 mg/dL — ABNORMAL HIGH (ref 70–99)
Glucose-Capillary: 201 mg/dL — ABNORMAL HIGH (ref 70–99)
Glucose-Capillary: 218 mg/dL — ABNORMAL HIGH (ref 70–99)
Glucose-Capillary: 324 mg/dL — ABNORMAL HIGH (ref 70–99)

## 2020-08-31 LAB — SEDIMENTATION RATE: Sed Rate: 117 mm/hr — ABNORMAL HIGH (ref 0–22)

## 2020-08-31 MED ORDER — METHYLPREDNISOLONE SODIUM SUCC 125 MG IJ SOLR
80.0000 mg | Freq: Three times a day (TID) | INTRAMUSCULAR | Status: DC
  Administered 2020-08-31 – 2020-09-04 (×11): 80 mg via INTRAVENOUS
  Filled 2020-08-31 (×12): qty 2

## 2020-08-31 MED ORDER — DIPHENHYDRAMINE HCL 50 MG/ML IJ SOLN: 50.0000 mg | Freq: Once | INTRAMUSCULAR | Status: AC

## 2020-08-31 MED ORDER — HYDROCORTISONE NA SUCCINATE PF 250 MG IJ SOLR
200.0000 mg | Freq: Once | INTRAMUSCULAR | Status: AC
  Administered 2020-08-31: 200 mg via INTRAVENOUS
  Filled 2020-08-31: qty 200

## 2020-08-31 MED ORDER — IOHEXOL 350 MG/ML SOLN
75.0000 mL | Freq: Once | INTRAVENOUS | Status: AC | PRN
  Administered 2020-08-31: 75 mL via INTRAVENOUS

## 2020-08-31 MED ORDER — METHYLPREDNISOLONE SODIUM SUCC 125 MG IJ SOLR: 80.0000 mg | Freq: Three times a day (TID) | INTRAMUSCULAR | Status: DC

## 2020-08-31 MED ORDER — DIPHENHYDRAMINE HCL 25 MG PO CAPS
50.0000 mg | ORAL_CAPSULE | Freq: Once | ORAL | Status: AC
  Administered 2020-08-31: 50 mg via ORAL
  Filled 2020-08-31: qty 2

## 2020-08-31 NOTE — Progress Notes (Signed)
Pt transported to CT and back on Bipap

## 2020-08-31 NOTE — Progress Notes (Addendum)
PROGRESS NOTE    Monica Ellis             UEK:[id number] DOB: 07-13-1961 DOA: 08/28/2020 PCP: Ferd Hibbs, NP   Brief Narrative:  Monica Smith-McIveris a 59 y.o.femalewithhistory of autoimmune hepatitis/lupus, morbid obesity, diabetes mellitus type 2, tobacco abuse, obesity hypoventilation syndrome/COPD presents to the ER because of increasing shortness of breath over the last 2 days. Patient states she has become progressively short of breath over the last few days but in the last 2 days with some productive cough. Has been some chest pressure and also has gained about 100 pounds per the last few months as per the patient. Patient was just recently discharged 2 months ago after being admitted for respiratory failure at the time patient was diuresed. In the ER patient's troponins were negative BNP was 167 Covid test was negative chest x-ray shows bilateral infiltrates patient was hypoxic requiring nonrebreather mildly lethargic but answering questions appropriately. Patient's labs are showing mildly elevated LFTs consistent with her patient's known history of autoimmune hepatitis. Since chest x-ray showing bilateral infiltrates patient was started on empiric antibiotics for pneumonia and admitted for further management.    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 and obesity hypoventilation, POA - Lower extremity edema, orthopnea, bibasilar rales all consistent with volume  overload; recent history of similar episode in presentation with resolution on diuretics - Previous echo unremarkable -Cardiology consulted for further insight and recommendations -CTA negative for PE, notable for septal thickening and moderate edema - Restrict diet, fluid restrict and salt restriction to improve fluid mobility - Increase lasix 80 IV twice daily, creatinine stable -Pulmonology consulted -CTA negative - Last EF measured in July 2021 was 60 to 15% without systolic or diastolic dysfunction.  - Procalcitonin negative, discontinue antibiotics, remains afebrile -Continue to wean oxygen down to baseline which appears to be 2 to 3 L nasal cannula around-the-clock -Patient admits to morning headaches, somnolence at baseline and feeling poorly rested consistent with productive sleep apnea, previously unable to follow-up for sleep study, will offer CPAP here at night and as needed with sleeping, recommend close monitoring with PCP and pulmonology for sleep study as soon as can be scheduled. - Offered very low-dose anxiolytic for BiPAP tolerance -Markedly improving oxygen over the past 24 hours now off of BiPAP on nasal cannula at 6 L with sats in the mid to high 90s.  Will likely require ongoing oxygen ambulation studies to ensure no profound hypoxia with exertion prior to disposition. SpO2: 94 % O2 Flow Rate (L/min): 6 L/min FiO2 (%): 50 %  Autoimmune hepatitis with elevated LFTs on mycophenolate and prednisone.  Continue home medications nadolol, spironolactone, lactulose Continue methylprednisolone per PCCM  Diabetes mellitus type 2  Decreasing patient's NovoLog 70/30 from 120 units to 90 units 3 times daily for now until we make sure patient blood sugar stable.  Closely follow CBGs at patient is also on steroids. Continue hypoglycemic protocol, sliding scale insulin Recent Labs  Lab Results  Component Value Date  HGBA1C 6.2 (H) 08/28/2020     History of lupus   Continue methylprednisolone as above Unclear if related to or exacerbating patient's pulmonary status as above  COPD, not in acute exacerbation  Continue nebulizer and steroids.  Morbid obesity Estimated body mass index is 54.92 kg/m as calculated from the following:   Height as of this encounter: _0  (1.651 m).   Weight as of this encounter: 149.7 kg. Lengthy discussion at bedside on need for dietary and lifestyle changes Likely exacerbating patient's above hypoxia with obesity hypoventilation syndrome as well as likely obstructive sleep apnea   DVT prophylaxis:Lovenox. Code Status:Full code. Family Communication: Called patient's daughter at bedside  Status is: Patient  Dispo: The patient is from: Home  Anticipated d/c is to: To be determined  Anticipated d/c date is: 72 to 96 hours pending clinical course  Patient currently not medically stable for discharge in the setting of acute hypoxic hypercarbic respiratory failure volume overload need for close monitoring and supplemental oxygen well above baseline.  Consultants:   None  Procedures:   None  Antimicrobials:  Azithromycin/ceftriaxone discontinued 08/28/2020  Subjective: No acute issues or events overnight, patient tolerated BiPAP well overnight, now on 6 L nasal cannula and feels markedly improved from admission.  She still reports orthopnea and marked dyspnea with exertion but again markedly improved from previous just not yet back to baseline.  She otherwise denies nausea, vomiting, diarrhea, constipation, headache, fevers, chills.  Objective:       Vitals:   09/01/20 0741 09/01/20 0743 09/01/20 0800 09/01/20 1219  BP: 117/88   (!) 143/54  Pulse: 60 (!) 59  63  Resp: (!) _1 Temp: 98.5 F (36.9 C)   98.6 F (37 C)  TempSrc: Oral   Oral  SpO2: 99%  93% 94%  Weight:      Height:       No intake or output data in the 24 hours ending  09/01/20 1509     Filed Weights   08/28/20 0135 08/29/20 0500  Weight: (!) 149.7 kg (!) 149.7 kg    Examination:  General: Pleasantly resting in bed, no acute distress, tolerating BiPAP well HEENT: Normocephalic atraumatic.  Sclerae nonicteric, noninjected.  Extraocular movements intact bilaterally. Neck: Without mass or deformity.  Trachea is midline. Lungs: Bibasilar rales without overt rhonchi or wheeze Heart: Regular rate and rhythm.  Without murmurs, rubs, or gallops. Abdomen: Soft, nontender, nondistended.  Without guarding or rebound. Extremities: 1+ pitting edema bilateral lower extremity Vascular: Dorsalis pedis and posterior tibial pulses palpable bilaterally. Skin: Warm and dry, no erythema, no ulcerations.  Data Reviewed: I have personally reviewed following labs and imaging studies  CBC: Last Labs            Recent Labs  Lab 08/28/20 0155 08/28/20 0611 08/28/20 0647 08/29/20 0111 08/30/20 0457 08/31/20 0230 09/01/20 0808  WBC 11.5*  --  11.6* 11.6* 10.4 9.3 9.9  NEUTROABS 8.0*  --   --   --   --   --   --   HGB 12.2   < > 11.7* 11.2* 10.7* 10.3* 11.5*  HCT 38.8   < > 38.3 35.4* 33.8* 33.1* 36.1  MCV 103.2*  --  103.0* 102.0* 102.4* 99.7 100.3*  PLT 226  --  212 183 196 211 162   < > = values in this interval not displayed.     Basic Metabolic Panel: Last Labs  Recent Labs  Lab 08/28/20 0155 08/28/20 0155 08/28/20 8527 08/28/20 0647 08/29/20 0111 08/30/20 0457 08/31/20 0230 09/01/20 0808   NA 136   < > 145  --  136 134* 134* 130*   K 3.1*   < > 3.4*  --  3.6 3.4* 3.5 4.0   CL 101  --   --   --  101 97* 97* 95*   CO2 26  --   --   --  _0 GLUCOSE 60*  --   --   --  175* 124* 177* 325*   BUN 15  --   --   --  26* 28* 22 22   CREATININE 0.93   < >  --  0.91 0.99 0.87 0.83 0.85   CALCIUM 8.5*  --   --   --  7.9* 7.8* 7.7* 7.9*   MG  --   --   --   --   --   --   --  1.8   PHOS  --   --   --   --   --   --   --  1.9*     < > = values in this interval not displayed.     GFR: Estimated Creatinine Clearance: 103.2 mL/min (by C-G formula based on SCr of 0.85 mg/dL). Liver Function Tests: Last Labs      Recent Labs  Lab 08/28/20 0155  AST 111*  ALT 186*  ALKPHOS 41  BILITOT 0.9  PROT 10.9*  ALBUMIN 3.2*     Last Labs   No results for input(s): LIPASE, AMYLASE in the last 168 hours.   Last Labs   No results for input(s): AMMONIA in the last 168 hours.   Coagulation Profile: Last Labs   No results for input(s): INR, PROTIME in the last 168 hours.   Cardiac Enzymes: Last Labs   No results for input(s): CKTOTAL, CKMB, CKMBINDEX, TROPONINI in the last 168 hours.   BNP (last 3 results) Recent Labs (within last 365 days)  No results for input(s): PROBNP in the last 8760 hours.   HbA1C: Recent Labs (last 2 labs)   No results for input(s): HGBA1C in the last 72 hours.   CBG: Last Labs          Recent Labs  Lab 08/31/20 1638 08/31/20 2139 09/01/20 0739 09/01/20 1221 09/01/20 1442  GLUCAP 218* 324* 322* 409* 375*     Lipid Profile: Recent Labs (last 2 labs)   No results for input(s): CHOL, HDL, LDLCALC, TRIG, CHOLHDL, LDLDIRECT in the last 72 hours.   Thyroid Function Tests: Recent Labs (last 2 labs)   No results for input(s): TSH, T4TOTAL, FREET4, T3FREE, THYROIDAB in the last 72 hours.   Anemia Panel: Recent Labs (last 2 labs)   No results for input(s): VITAMINB12, FOLATE, FERRITIN, TIBC, IRON, RETICCTPCT in the last 72 hours.   Sepsis Labs: Last Labs   Recent Labs  Lab 08/28/20 0155 08/28/20 0647  PROCALCITON  --  <0.10  LATICACIDVEN 0.9  --              Recent Results (from the past 240 hour(s))  SARS Coronavirus 2 by RT PCR (hospital order, performed in [facility] hospital lab) Nasopharyngeal Nasopharyngeal Swab     Status: None   Collection Time: 08/28/20  1:38 AM   Specimen: Nasopharyngeal Swab  Result Value Ref Range Status   SARS Coronavirus 2  NEGATIVE NEGATIVE  Final    Comment: (NOTE) SARS-CoV-2 target nucleic acids are NOT DETECTED.  The SARS-CoV-2 RNA is generally detectable in upper and lower respiratory specimens during the acute phase of infection. The lowest concentration of SARS-CoV-2 viral copies this assay can detect is 250 copies / mL. A negative result does not preclude SARS-CoV-2 infection and should not be used as the sole basis for treatment or other patient management decisions.  A negative result may occur with improper specimen collection / handling, submission of specimen other than nasopharyngeal swab, presence of viral mutation(s) within the areas targeted by this assay, and inadequate number of viral copies (<250 copies / mL). A negative result must be combined with clinical observations, patient history, and epidemiological information.  Fact Sheet for Patients:   StrictlyIdeas.no  Fact Sheet for Healthcare Providers: BankingDealers.co.za  This test is not yet approved or  cleared by the [place] FDA and has been authorized for detection and/or diagnosis of SARS-CoV-2 by FDA under an Emergency Use Authorization (EUA).  This EUA will remain in effect (meaning this test can be used) for the duration of the COVID-19 declaration under Section 564(b)(1) of the Act, 21 U.S.C. section 360bbb-3(b)(1), unless the authorization is terminated or revoked sooner.  Performed at [facility], [place] [address], [place], [place] [address]   Blood culture (routine x 2)     Status: None (Preliminary result)   Collection Time: 08/28/20  1:55 AM   Specimen: BLOOD  Result Value Ref Range Status   Specimen Description BLOOD SITE NOT SPECIFIED  Final   Special Requests   Final    BOTTLES DRAWN AEROBIC AND ANAEROBIC Blood Culture adequate volume   Culture   Final    NO GROWTH 4 DAYS Performed at [facility], [place] [address]  [place], [place] [address]    Report Status PENDING  Incomplete  Urine culture     Status: None   Collection Time: 08/28/20  1:55 AM   Specimen: Urine, Random  Result Value Ref Range Status   Specimen Description URINE, RANDOM  Final   Special Requests NONE  Final   Culture   Final    NO GROWTH Performed at [facility], [address] [place], [place] [address]    Report Status 08/29/2020 FINAL  Final  Blood culture (routine x 2)     Status: None (Preliminary result)   Collection Time: 08/28/20  3:15 AM   Specimen: BLOOD RIGHT HAND  Result Value Ref Range Status   Specimen Description BLOOD RIGHT HAND  Final   Special Requests   Final    BOTTLES DRAWN AEROBIC AND ANAEROBIC Blood Culture adequate volume   Culture   Final    NO GROWTH 4 DAYS Performed at [facility], [place] [address], [place], [place] [address]    Report Status PENDING  Incomplete    Radiology Studies:  Imaging Results (Last 48 hours)  CT ANGIO CHEST PE W OR WO CONTRAST  Result Date: 08/31/2020 CLINICAL DATA:  Increasing shortness of breath over the past 2 days. EXAM: CT ANGIOGRAPHY CHEST WITH CONTRAST TECHNIQUE: Multidetector CT imaging of the chest was performed using the standard protocol during bolus administration of intravenous contrast. Multiplanar CT image reconstructions and MIPs were obtained to evaluate the vascular anatomy. CONTRAST:  89m OMNIPAQUE IOHEXOL 350 MG/ML SOLN Patient was pre-medicated prior to the exam for contrast allergy. COMPARISON:  Radiograph yesterday.  Chest CT 06/11/2020 FINDINGS: Cardiovascular: There are no  filling defects within the pulmonary arteries to suggest pulmonary embolus. Dilated main pulmonary artery at 3.7 cm. There is multi chamber cardiomegaly. No pericardial effusion. Aortic atherosclerosis and tortuosity. No aortic aneurysm. Mediastinum/Nodes: Prominent lower paratracheal nodes measure up to 12 mm short axis. There are small  bilateral hilar lymph nodes. No suspicious thyroid nodule. No esophageal wall thickening. Lungs/Pleura: There are multifocal ground-glass and patchy opacities throughout both lungs. Areas of septal thickening. Minimal pleural thickening without significant effusion. Trachea and central bronchi are grossly patent, imaging obtained in expiration limiting assessment. Upper Abdomen: Enlarged liver with nodular contours, partially included. No acute findings. Musculoskeletal: Degenerative change in the spine. There are no acute or suspicious osseous abnormalities. Review of the MIP images confirms the above findings. IMPRESSION: 1. No pulmonary embolus. 2. Multifocal ground-glass and patchy opacities throughout both lungs. There is also septal thickening. Findings may represent pulmonary edema, multifocal pneumonia, or combination there of. 3. Cardiomegaly. Dilated main pulmonary artery, can be seen with pulmonary arterial hypertension. 4. Prominent lower paratracheal and hilar lymph nodes are likely reactive Aortic Atherosclerosis (ICD10-I70.0). Electronically Signed   By: Keith Rake M.D.   On: 08/31/2020 21:23     Scheduled Meds: . amLODipine  10 mg Oral Daily  . buPROPion  150 mg Oral Daily  . DULoxetine  60 mg Oral QHS  . enoxaparin (LOVENOX) injection  75 mg Subcutaneous Q24H  . furosemide  80 mg Intravenous Q12H  . insulin aspart  0-15 Units Subcutaneous TID WC  . insulin aspart  0-5 Units Subcutaneous QHS  . insulin aspart protamine- aspart  90 Units Subcutaneous TID AC  . lactulose  20 g Oral TID  . magnesium oxide  200 mg Oral QHS  . methylPREDNISolone (SOLU-MEDROL) injection  80 mg Intravenous Q8H  . mycophenolate  720 mg Oral BID  . nadolol  80 mg Oral BID  . omega-3 acid ethyl esters  1 g Oral Daily  . potassium chloride SA  20 mEq Oral Daily  . spironolactone  25 mg Oral Daily    LOS: 4 days   Time spent: 7mn  Vonita Calloway C Saverio Kader, DO [facility]  If 7PM-7AM,  please contact night-coverage [email or web address]  09/01/2020, 3:09 PM

## 2020-08-31 NOTE — Consult Note (Signed)
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  History of brain tumor meningioma.  Poorly controlled diabetes mellitus.  Gastroesophageal reflux disease.  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  She presents with increasing shortness of breath and volume overload it was treated recently with aggressive diuresis and corrected.  Since admission she has been treated daily with twice daily Lasix reportedly is diuresis although there is no INO to cooperate but she is remained refractory to treatment and has been on BiPAP for in excess of 48 hours.  Pulmonary critical care was called to evaluate.  We have suggested a CTA since patients with her history have all tendency towards pulmonary embolism.  We will also do a lupus complement for further work-up.  We agree with aggressive diuresis we will increase her steroids to Solu-Medrol 80 mg 3 times daily and stop her prednisone.  She is stated that she would want to be intubated if needed currently she is in the stepdown unit but there is a low threshold to transfer her to ICU if she worsens.  Past Medical History   Past Medical History:  Diagnosis Date   Autoimmune hepatitis ([place])    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   Brain tumor ([place])    hx of   Diabetes mellitus without complication (HCC)    GERD  (gastroesophageal reflux disease)    Hypertension    Lupus (HCC)    Morbid obesity (HCC)    Pneumonia    Shortness of breath dyspnea    Tobacco use disorder      Significant Hospital Events   08/31/2020 BiPAP dependent for 48 hours  Consults:  08/31/2020 pulmonary critical care  Procedures:    Significant Diagnostic Tests:   08/31/2020 CTA>> Micro Data:  08/28/2020 blood cultures x2 08/28/2020 urine culture without growth  Antimicrobials:    Interim history/subjective:  She has been BiPAP dependent for greater than 48 hours  Objective   Blood pressure (!) 141/54, pulse (!) 58, temperature 98.6 F (37 C), temperature source Oral, resp. rate 19, height _0  (1.651 m), weight (!) 149.7 kg, SpO2 94 %.        Intake/Output Summary (Last 24 hours) at 08/31/2020 1403 Last data filed at 08/31/2020 0900 Gross per 24 hour  Intake 240 ml  Output --  Net 240 ml   Filed Weights   08/28/20 0135 08/29/20 0500  Weight: (!) 149.7 kg (!) 149.7 kg    Examination: General: Morbidly obese female who is not in acute distress while on BiPAP HENT: No JVD is appreciated Lungs: Decreased movement throughout Cardiovascular: Heart sounds are distant Abdomen: Obese soft nontender Extremities: 3-4+ edema Neuro: No focal defects GU: Voids  Resolved Hospital Problem list     Assessment & Plan:  Acute on chronic hypoxic respiratory failure in the setting of lupus with ongoing liver failure and with pulmonary involvement.  Requiring high flow oxygen improved  refractory to aggressive diuresis. O2 as needed Noninvasive as needed Agree with aggressive diuresis Strict INO Stop prednisone and start Solu-Medrol 80 mg 3 times daily in the setting of lupus steroid-dependent. CT of the chest to rule out PE Lupus complement evaluation Low threshold to transfer to intensive care unit as she has been on noninvasive mechanical ventilatory support for approximately 48 hours unable to come  off.  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 See above  Morbid obesity Weight loss  Continued tobacco abuse Smoking cessation   Diabetes mellitus CBG (last 3)  Recent Labs    08/31/20 0815 08/31/20 1209 08/31/20 1403  GLUCAP 195* 201* 201*   Per primary    Best practice:  Diet: Heart healthy diet Pain/Anxiety/Delirium protocol (if indicated): Not indicated VAP protocol (if indicated): In place DVT prophylaxis: In place GI prophylaxis: PPI Glucose control: Sliding scale insulin Mobility: Out of bed as tolerated Code Status: Full Family Communication: 08/31/2020 patient updated at bedside Disposition: Currently in progressive care unit but low threshold to transfer to intensive care unit  Labs   CBC: Recent Labs  Lab 08/28/20 0155 08/28/20 0155 08/28/20 0611 08/28/20 0647 08/29/20 0111 08/30/20 0457 08/31/20 0230  WBC 11.5*  --   --  11.6* 11.6* 10.4 9.3  NEUTROABS 8.0*  --   --   --   --   --   --   HGB 12.2   < > 13.6 11.7* 11.2* 10.7* 10.3*  HCT 38.8   < > 40.0 38.3 35.4* 33.8* 33.1*  MCV 103.2*  --   --  103.0* 102.0* 102.4* 99.7  PLT 226  --   --  212 183 196 211   < > = values in this interval not displayed.    Basic Metabolic Panel: Recent Labs  Lab 08/28/20 0155 08/28/20 0611 08/28/20 0647 08/29/20 0111 08/30/20 0457 08/31/20 0230  NA 136 145  --  136 134* 134*  K 3.1* 3.4*  --  3.6 3.4* 3.5  CL 101  --   --  101 97* 97*  CO2 26  --   --  _0 GLUCOSE 60*  --   --  175* 124* 177*  BUN 15  --   --  26* 28* 22  CREATININE 0.93  --  0.91 0.99 0.87 0.83  CALCIUM 8.5*  --   --  7.9* 7.8* 7.7*   GFR: Estimated Creatinine Clearance: 105.7 mL/min (by C-G formula based on SCr of 0.83 mg/dL). Recent Labs  Lab 08/28/20 0155 08/28/20 0155 08/28/20 0647 08/29/20 0111 08/30/20 0457 08/31/20 0230  PROCALCITON  --   --  <0.10  --   --   --   WBC 11.5*   < > 11.6* 11.6* 10.4 9.3  LATICACIDVEN 0.9  --   --    --   --   --    < > = values in this interval not displayed.    Liver Function Tests: Recent Labs  Lab 08/28/20 0155  AST 111*  ALT 186*  ALKPHOS 41  BILITOT 0.9  PROT 10.9*  ALBUMIN 3.2*   No results for input(s): LIPASE, AMYLASE in the last 168 hours. No results for input(s): AMMONIA in the last 168 hours.  ABG    Component Value Date/Time   PHART 7.305 (L) 08/28/2020 0611   PCO2ART 61.8 (H) 08/28/2020 0611   PO2ART 78 (L) 08/28/2020 0611   HCO3 30.7 (H) 08/28/2020 0611   TCO2 33 (  H) 08/28/2020 1610   O2SAT 93.0 08/28/2020 0611     Coagulation Profile: No results for input(s): INR, PROTIME in the last 168 hours.  Cardiac Enzymes: No results for input(s): CKTOTAL, CKMB, CKMBINDEX, TROPONINI in the last 168 hours.  HbA1C: Hgb A1c MFr Bld  Date/Time Value Ref Range Status  08/28/2020 06:47 AM 6.2 (H) 4.8 - 5.6 % Final    Comment:    (NOTE) Pre diabetes:          5.7%-6.4%  Diabetes:              >6.4%  Glycemic control for   <7.0% adults with diabetes   06/11/2020 05:02 AM 6.4 (H) 4.8 - 5.6 % Final    Comment:    (NOTE) Pre diabetes:          5.7%-6.4%  Diabetes:              >6.4%  Glycemic control for   <7.0% adults with diabetes     CBG: Recent Labs  Lab 08/30/20 1206 08/30/20 1726 08/30/20 2125 08/31/20 0815 08/31/20 1209  GLUCAP 182* 227* 186* 195* 201*    Review of Systems:   10 point review of system taken, please see HPI for positives and negatives.   Past Medical History  She,  has a past medical history of Autoimmune hepatitis ([place]), Brain tumor ([place]), Diabetes mellitus without complication ([place]), GERD (gastroesophageal reflux disease), Hypertension, Lupus ([place]), Morbid obesity ([place]), Pneumonia, Shortness of breath dyspnea, and Tobacco use disorder.   Surgical History    Past Surgical History:  Procedure Laterality Date   CHOLECYSTECTOMY     DILATION AND CURETTAGE OF UTERUS     EYE SURGERY Bilateral    laser surgery     IR TRANSCATHETER BX  11/03/2018   IR US GUIDE VASC ACCESS RIGHT  11/03/2018   IR VENOGRAM HEPATIC W HEMODYNAMIC EVALUATION  11/03/2018     Social History   reports that she has been smoking cigarettes. She has a 30.00 pack-year smoking history. She has never used smokeless tobacco. She reports that she does not drink alcohol and does not use drugs.   Family History   Her family history includes Cancer in her maternal uncle; Diabetes in her father; Hypertension in her father and mother; Kidney cancer in her maternal uncle.   Allergies Allergies  Allergen Reactions   Ace Inhibitors Swelling   Imuran [Azathioprine] Nausea And Vomiting    Severe N/V/D and AKI after starting the med.   Lisinopril Swelling and Other (See Comments)    Other reaction(s): Angioedema (ALLERGY/intolerance), Face   Protonix [Pantoprazole] Swelling    Pt states that it causes her lips to swell.    Tramadol Other (See Comments)    Other reaction(s): Mental Status Changes (intolerance)   Iodinated Diagnostic Agents Swelling    When patient was in her 20's, swelled up all over after getting getting injection of contrast; did not have any other symptoms/ was given benadryl after that happened and had no further problems per patients/   Iodine Swelling   Janumet [Sitagliptin-Metformin Hcl] Other (See Comments)    Continuous yeast infection   Omeprazole Swelling    Lips will swell   Victoza [Liraglutide] Nausea And Vomiting     Home Medications  Prior to Admission medications   Medication Sig Start Date End Date Taking? Authorizing Provider  amLODipine (NORVASC) 10 MG tablet Take 10 mg by mouth daily. 08/04/20  Yes [provider]  ascorbic acid (  VITAMIN C) 500 MG tablet Take 2,000 mg by mouth daily.   Yes [provider]  buPROPion (WELLBUTRIN XL) 150 MG 24 hr tablet Take 150 mg by mouth daily. 05/21/20  Yes [provider]  Cholecalciferol (VITAMIN D PO) Take 1 tablet by  mouth every evening.    Yes [provider]  DULoxetine (CYMBALTA) 60 MG capsule Take 60 mg by mouth at bedtime.  08/26/16  Yes [provider]  fexofenadine-pseudoephedrine (ALLEGRA-D 24) 180-240 MG 24 hr tablet Take 1 tablet by mouth daily.   Yes [provider]  furosemide (LASIX) 40 MG tablet Take 1 tablet (40 mg total) by mouth 2 (two) times daily. 06/17/20  Yes Domenic Polite, MD  insulin lispro protamine-lispro (HUMALOG 75/25 MIX) (75-25) 100 UNIT/ML SUSP injection Inject 120 Units into the skin 3 (three) times daily with meals. 06/17/20  Yes Domenic Polite, MD  lactulose (CHRONULAC) 10 GM/15ML solution Take 30 mLs by mouth 3 (three) times daily as needed for constipation. 11/22/19  Yes [provider]  LEVSIN 0.125 MG tablet Take 0.125 mg by mouth every 4 (four) hours as needed for spasms. 08/08/20  Yes [provider]  LORazepam (ATIVAN) 0.5 MG tablet Take 0.5 mg by mouth 3 (three) times daily as needed for anxiety. 08/27/20  Yes [provider]  magnesium 30 MG tablet Take 30 mg by mouth at bedtime.    Yes [provider]  Multiple Vitamin (MULTIVITAMIN) tablet Take 1 tablet by mouth daily.    Yes [provider]  mycophenolate (MYFORTIC) 360 MG TBEC EC tablet Take 720 mg by mouth 2 (two) times daily. 05/13/20  Yes [provider]  nadolol (CORGARD) 80 MG tablet Take 80 mg by mouth 2 (two) times daily. 07/06/20  Yes [provider]  NASAL SALINE NA Place 1 spray into the nose daily as needed (Congestion and Allergies).   Yes [provider]  Omega-3 Fatty Acids (FISH OIL PO) Take 1 capsule by mouth every evening.    Yes [provider]  oxyCODONE (OXYCONTIN) 10 mg 12 hr tablet Take 10 mg by mouth every 6 (six) hours as needed for pain.   Yes [provider]  potassium chloride SA (KLOR-CON) 20 MEQ tablet Take 20 mEq by mouth daily. 03/12/20  Yes [provider]  predniSONE  (DELTASONE) 5 MG tablet Take 12.5 mg by mouth daily with breakfast.    Yes [provider]  spironolactone (ALDACTONE) 25 MG tablet Take 1 tablet (25 mg total) by mouth daily. 06/18/20  Yes Domenic Polite, MD  TURMERIC PO Take 400 mg by mouth daily.    Yes [provider]  zolpidem (AMBIEN) 10 MG tablet Take 10 mg by mouth at bedtime. 05/15/19  Yes [provider]     Critical care time: 35 min     Richardson Landry Joni Colegrove ACNP Acute Care Nurse Practitioner [place] Please consult Amion 08/31/2020, 2:03 PM

## 2020-09-01 ENCOUNTER — Encounter (HOSPITAL_COMMUNITY): Payer: Self-pay | Admitting: Internal Medicine

## 2020-09-01 ENCOUNTER — Encounter (HOSPITAL_COMMUNITY): Payer: Medicare Other

## 2020-09-01 ENCOUNTER — Encounter (HOSPITAL_COMMUNITY)
Admission: EM | Disposition: A | Discharge status: Home / Self Care | Payer: Self-pay | Source: Home / Self Care | Attending: Internal Medicine

## 2020-09-01 DIAGNOSIS — J9621 Acute and chronic respiratory failure with hypoxia: Principal | ICD-10-CM

## 2020-09-01 HISTORY — PX: RIGHT HEART CATH: CATH118263

## 2020-09-01 LAB — BASIC METABOLIC PANEL
Anion gap: 8 (ref 5–15)
BUN: 22 mg/dL (ref 8–23)
CO2: 27 mmol/L (ref 22–32)
Calcium: 7.9 mg/dL — ABNORMAL LOW (ref 8.9–10.3)
Chloride: 95 mmol/L — ABNORMAL LOW (ref 98–111)
Creatinine, Ser: 0.85 mg/dL (ref 0.44–1.00)
GFR calc Af Amer: >60 mL/min (ref >60)
GFR calc non Af Amer: >60 mL/min (ref >60)
Glucose, Bld: 325 mg/dL — ABNORMAL HIGH (ref 70–99)
Potassium: 4 mmol/L (ref 3.5–5.1)
Sodium: 130 mmol/L — ABNORMAL LOW (ref 135–145)

## 2020-09-01 LAB — GLUCOSE, CAPILLARY
Glucose-Capillary: 188 mg/dL — ABNORMAL HIGH (ref 70–99)
Glucose-Capillary: 215 mg/dL — ABNORMAL HIGH (ref 70–99)
Glucose-Capillary: 322 mg/dL — ABNORMAL HIGH (ref 70–99)
Glucose-Capillary: 375 mg/dL — ABNORMAL HIGH (ref 70–99)
Glucose-Capillary: 409 mg/dL — ABNORMAL HIGH (ref 70–99)

## 2020-09-01 LAB — HIV ANTIBODY (ROUTINE TESTING W REFLEX): HIV Screen 4th Generation wRfx: REACTIVE — AB

## 2020-09-01 LAB — CBC
HCT: 36.1 % (ref 36.0–46.0)
Hemoglobin: 11.5 g/dL — ABNORMAL LOW (ref 12.0–15.0)
MCH: 31.9 pg (ref 26.0–34.0)
MCHC: 31.9 g/dL (ref 30.0–36.0)
MCV: 100.3 fL — ABNORMAL HIGH (ref 80.0–100.0)
Platelets: 162 10*3/uL (ref 150–400)
RBC: 3.6 MIL/uL — ABNORMAL LOW (ref 3.87–5.11)
RDW: 14.4 % (ref 11.5–15.5)
WBC: 9.9 10*3/uL (ref 4.0–10.5)
nRBC: 0 % (ref 0.0–0.2)

## 2020-09-01 LAB — PHOSPHORUS: Phosphorus: 1.9 mg/dL — ABNORMAL LOW (ref 2.5–4.6)

## 2020-09-01 LAB — POCT I-STAT EG7
Acid-Base Excess: 5 mmol/L — ABNORMAL HIGH (ref 0.0–2.0)
Acid-Base Excess: 5 mmol/L — ABNORMAL HIGH (ref 0.0–2.0)
Acid-Base Excess: 8 mmol/L — ABNORMAL HIGH (ref 0.0–2.0)
Bicarbonate: 30.8 mmol/L — ABNORMAL HIGH (ref 20.0–28.0)
Bicarbonate: 31.1 mmol/L — ABNORMAL HIGH (ref 20.0–28.0)
Bicarbonate: 33.5 mmol/L — ABNORMAL HIGH (ref 20.0–28.0)
Calcium, Ion: 1.01 mmol/L — ABNORMAL LOW (ref 1.15–1.40)
Calcium, Ion: 1.07 mmol/L — ABNORMAL LOW (ref 1.15–1.40)
Calcium, Ion: 1.07 mmol/L — ABNORMAL LOW (ref 1.15–1.40)
HCT: 38 % (ref 36.0–46.0)
HCT: 39 % (ref 36.0–46.0)
HCT: 40 % (ref 36.0–46.0)
Hemoglobin: 12.9 g/dL (ref 12.0–15.0)
Hemoglobin: 13.3 g/dL (ref 12.0–15.0)
Hemoglobin: 13.6 g/dL (ref 12.0–15.0)
O2 Saturation: 60 %
O2 Saturation: 62 %
O2 Saturation: 66 %
Potassium: 3.3 mmol/L — ABNORMAL LOW (ref 3.5–5.1)
Potassium: 3.5 mmol/L (ref 3.5–5.1)
Potassium: 3.5 mmol/L (ref 3.5–5.1)
Sodium: 140 mmol/L (ref 135–145)
Sodium: 141 mmol/L (ref 135–145)
Sodium: 142 mmol/L (ref 135–145)
TCO2: 32 mmol/L (ref 22–32)
TCO2: 33 mmol/L — ABNORMAL HIGH (ref 22–32)
TCO2: 35 mmol/L — ABNORMAL HIGH (ref 22–32)
pCO2, Ven: 49.5 mmHg (ref 44.0–60.0)
pCO2, Ven: 50.4 mmHg (ref 44.0–60.0)
pCO2, Ven: 50.9 mmHg (ref 44.0–60.0)
pH, Ven: 7.394 (ref 7.250–7.430)
pH, Ven: 7.406 (ref 7.250–7.430)
pH, Ven: 7.425 (ref 7.250–7.430)
pO2, Ven: 32 mmHg (ref 32.0–45.0)
pO2, Ven: 33 mmHg (ref 32.0–45.0)
pO2, Ven: 34 mmHg (ref 32.0–45.0)

## 2020-09-01 LAB — BLOOD GAS, ARTERIAL
Acid-Base Excess: 6.9 mmol/L — ABNORMAL HIGH (ref 0.0–2.0)
Bicarbonate: 31.2 mmol/L — ABNORMAL HIGH (ref 20.0–28.0)
Drawn by: 28340
FIO2: 50
O2 Saturation: 94 %
Patient temperature: 37
pCO2 arterial: 46.5 mmHg (ref 32.0–48.0)
pH, Arterial: 7.441 (ref 7.350–7.450)
pO2, Arterial: 70.8 mmHg — ABNORMAL LOW (ref 83.0–108.0)

## 2020-09-01 LAB — MAGNESIUM: Magnesium: 1.8 mg/dL (ref 1.7–2.4)

## 2020-09-01 SURGERY — RIGHT HEART CATH
Anesthesia: LOCAL

## 2020-09-01 MED ORDER — HEPARIN (PORCINE) IN NACL 1000-0.9 UT/500ML-% IV SOLN
INTRAVENOUS | Status: DC | PRN
  Administered 2020-09-01: 500 mL

## 2020-09-01 MED ORDER — SODIUM CHLORIDE 0.9 % IV SOLN: 250.0000 mL | INTRAVENOUS | Status: DC | PRN

## 2020-09-01 MED ORDER — SODIUM CHLORIDE 0.9 % IV SOLN: INTRAVENOUS | Status: DC

## 2020-09-01 MED ORDER — FUROSEMIDE 10 MG/ML IJ SOLN
120.0000 mg | Freq: Two times a day (BID) | INTRAVENOUS | Status: DC
  Administered 2020-09-01 – 2020-09-04 (×5): 120 mg via INTRAVENOUS
  Filled 2020-09-01: qty 12
  Filled 2020-09-01: qty 10
  Filled 2020-09-01 (×5): qty 12

## 2020-09-01 MED ORDER — POTASSIUM CHLORIDE CRYS ER 20 MEQ PO TBCR
20.0000 meq | EXTENDED_RELEASE_TABLET | Freq: Two times a day (BID) | ORAL | Status: DC
  Administered 2020-09-01 – 2020-09-02 (×2): 20 meq via ORAL
  Filled 2020-09-01 (×2): qty 1

## 2020-09-01 MED ORDER — INSULIN ASPART 100 UNIT/ML ~~LOC~~ SOLN
0.0000 [IU] | Freq: Every day | SUBCUTANEOUS | Status: DC
  Administered 2020-09-02: 2 [IU] via SUBCUTANEOUS
  Administered 2020-09-03 – 2020-09-04 (×2): 5 [IU] via SUBCUTANEOUS

## 2020-09-01 MED ORDER — INSULIN ASPART 100 UNIT/ML ~~LOC~~ SOLN
0.0000 [IU] | Freq: Three times a day (TID) | SUBCUTANEOUS | Status: DC
  Administered 2020-09-01: 6 [IU] via SUBCUTANEOUS
  Administered 2020-09-02: 3 [IU] via SUBCUTANEOUS
  Administered 2020-09-02: 11 [IU] via SUBCUTANEOUS
  Administered 2020-09-02: 3 [IU] via SUBCUTANEOUS
  Administered 2020-09-03: 2 [IU] via SUBCUTANEOUS
  Administered 2020-09-03: 3 [IU] via SUBCUTANEOUS
  Administered 2020-09-03: 8 [IU] via SUBCUTANEOUS
  Administered 2020-09-04: 3 [IU] via SUBCUTANEOUS
  Administered 2020-09-04: 5 [IU] via SUBCUTANEOUS
  Administered 2020-09-04: 3 [IU] via SUBCUTANEOUS
  Administered 2020-09-05: 5 [IU] via SUBCUTANEOUS

## 2020-09-01 MED ORDER — SODIUM CHLORIDE 0.9 % IV SOLN
INTRAVENOUS | Status: AC | PRN
  Administered 2020-09-01: 10 mL/h via INTRAVENOUS

## 2020-09-01 MED ORDER — LIDOCAINE HCL (PF) 1 % IJ SOLN
INTRAMUSCULAR | Status: DC | PRN
  Administered 2020-09-01: 2 mL

## 2020-09-01 MED ORDER — SODIUM CHLORIDE 0.9% FLUSH
3.0000 mL | Freq: Two times a day (BID) | INTRAVENOUS | Status: DC
  Administered 2020-09-01 – 2020-09-05 (×7): 3 mL via INTRAVENOUS

## 2020-09-01 MED ORDER — SODIUM CHLORIDE 0.9% FLUSH: 3.0000 mL | Freq: Two times a day (BID) | INTRAVENOUS | Status: DC

## 2020-09-01 MED ORDER — SODIUM CHLORIDE 0.9% FLUSH: 3.0000 mL | INTRAVENOUS | Status: DC | PRN

## 2020-09-01 SURGICAL SUPPLY — 12 items
CATH BALLN WEDGE 5F 110CM (CATHETERS) ×2 IMPLANT
HOVERMATT SINGLE USE (MISCELLANEOUS) ×1 IMPLANT
KIT MICROPUNCTURE NIT STIFF (SHEATH) ×1 IMPLANT
PACK CARDIAC CATHETERIZATION (CUSTOM PROCEDURE TRAY) ×2 IMPLANT
PROTECTION STATION PRESSURIZED (MISCELLANEOUS) ×2
SHEATH GLIDE SLENDER 4/5FR (SHEATH) ×1 IMPLANT
SHEATH PROBE COVER 6X72 (BAG) ×1 IMPLANT
STATION PROTECTION PRESSURIZED (MISCELLANEOUS) IMPLANT
TRANSDUCER W/STOPCOCK (MISCELLANEOUS) ×2 IMPLANT
TUBING ART PRESS 72  MALE/FEM (TUBING) ×1
TUBING ART PRESS 72 MALE/FEM (TUBING) IMPLANT
WIRE MICROPUNCTURE .018X50CM (WIRE) ×3 IMPLANT

## 2020-09-01 NOTE — Progress Notes (Addendum)
PROGRESS NOTE    Loney Peto  OFB:[id number] DOB: 09/16/1961 DOA: 08/28/2020 PCP: Ferd Hibbs, NP   Brief Narrative:  Anokhi Shannon is a 59 y.o. female with history of autoimmune hepatitis/lupus, morbid obesity, diabetes mellitus type 2, tobacco abuse, obesity hypoventilation syndrome/COPD presents to the ER because of increasing shortness of breath over the last 2 days.  Patient states she has become progressively short of breath over the last few days but in the last 2 days with some productive cough.  Has been some chest pressure and also has gained about 100 pounds per the last few months as per the patient.  Patient was just recently discharged 2 months ago after being admitted for respiratory failure at the time patient was diuresed. In the ER patient's troponins were negative BNP was 167 Covid test was negative chest x-ray shows bilateral infiltrates patient was hypoxic requiring nonrebreather mildly lethargic but answering questions appropriately.  Patient's labs are showing mildly elevated LFTs consistent with her patient's known history of autoimmune hepatitis.  Since chest x-ray showing bilateral infiltrates patient was started on empiric antibiotics for pneumonia and admitted for further management.    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 and obesity hypoventilation, POA - Lower extremity edema, orthopnea, bibasilar rales all consistent with volume overload; recent  history of similar episode in presentation with resolution on diuretics - Previous echo unremarkable -Cardiology consulted for further insight and recommendations -CTA negative for PE, notable for septal thickening and moderate edema - Restrict diet, fluid restrict and salt restriction to improve fluid mobility - Increase lasix 80 IV twice daily, creatinine stable -Pulmonology consulted -CTA negative - Last EF measured in July 2021 was 60 to 78% without systolic or diastolic dysfunction.   - Procalcitonin negative, discontinue antibiotics, remains afebrile -Continue to wean oxygen down to baseline which appears to be 2 to 3 L nasal cannula around-the-clock -Patient admits to morning headaches, somnolence at baseline and feeling poorly rested consistent with productive sleep apnea, previously unable to follow-up for sleep study, will offer CPAP here at night and as needed with sleeping, recommend close monitoring with PCP and pulmonology for sleep study as soon as can be scheduled. - Offered very low-dose anxiolytic for BiPAP tolerance -Markedly improving oxygen over the past 24 hours now off of BiPAP on nasal cannula at 6 L with sats in the mid to high 90s.  Will likely require ongoing oxygen ambulation studies to ensure no profound hypoxia with exertion prior to disposition. SpO2: 94 % O2 Flow Rate (L/min): 6 L/min FiO2 (%): 50 %  Autoimmune hepatitis with elevated LFTs on mycophenolate and prednisone.  Continue home medications nadolol, spironolactone, lactulose Continue methylprednisolone per PCCM  Diabetes mellitus type 2  Continue NovoLog 70/30 90 units 3 times daily for now until we make sure patient blood sugar stable.   Increase sliding scale insulin given increased glucose readings with high-dose steroids Continue hypoglycemic protocol, sliding scale insulin Lab Results  Component Value Date   HGBA1C 6.2 (H) 08/28/2020  History of lupus  Continue methylprednisolone as  above Unclear if related to or exacerbating patient's pulmonary status as above  COPD, not in acute exacerbation  Continue nebulizer and steroids.  Morbid obesity Estimated body mass index is 54.92 kg/m as calculated from the following:   Height as of this encounter: _0  (1.651 m).   Weight as of this encounter: 149.7 kg. Lengthy discussion at bedside on need for dietary and lifestyle changes Likely exacerbating patient's above hypoxia with obesity hypoventilation syndrome as well as likely obstructive sleep apnea   DVT prophylaxis: Lovenox. Code Status: Full code. Family Communication: Called patient's daughter at bedside  Status is: Patient  Dispo: The patient is from: Home             Anticipated d/c is to: To be determined             Anticipated d/c date is: 72 to 96 hours pending clinical course             Patient currently not medically stable for discharge in the setting of acute hypoxic hypercarbic respiratory failure volume overload need for close monitoring and supplemental oxygen well above baseline.  Consultants:   None  Procedures:   None  Antimicrobials:  Azithromycin/ceftriaxone discontinued 08/28/2020  Subjective: No acute issues or events overnight, patient tolerated BiPAP well overnight, now on 6 L nasal cannula and feels markedly improved from admission.  She still reports orthopnea and marked dyspnea with exertion but again markedly improved from previous just not yet back to baseline.  She otherwise denies nausea, vomiting, diarrhea, constipation, headache, fevers, chills.  Objective: Vitals:   09/01/20 0741 09/01/20 0743 09/01/20 0800 09/01/20 1219  BP: 117/88   (!) 143/54  Pulse: 60 (!) 59  63  Resp: (!) _1 Temp: 98.5 F (36.9 C)   98.6 F (37 C)  TempSrc: Oral   Oral  SpO2: 99%  93% 94%  Weight:      Height:       No intake or output data in the 24 hours ending 09/01/20 1519 Filed Weights   08/28/20 0135 08/29/20 0500   Weight: (!) 149.7 kg (!) 149.7 kg    Examination:  General: Pleasantly resting in bed, no acute distress, tolerating BiPAP well HEENT: Normocephalic atraumatic.  Sclerae nonicteric, noninjected.  Extraocular movements intact bilaterally. Neck: Without mass or deformity.  Trachea is midline. Lungs: Bibasilar rales without overt rhonchi or wheeze Heart: Regular rate and rhythm.  Without murmurs, rubs, or gallops. Abdomen: Soft, nontender, nondistended.  Without guarding or rebound. Extremities: 1+ pitting edema bilateral lower extremity Vascular: Dorsalis pedis and posterior tibial pulses palpable bilaterally. Skin: Warm and dry, no erythema, no ulcerations.  Data Reviewed: I have personally reviewed following labs and imaging studies  CBC: Recent Labs  Lab 08/28/20 0155 08/28/20 0611 08/28/20 0647 08/29/20 0111 08/30/20 0457 08/31/20 0230 09/01/20 0808  WBC 11.5*  --  11.6* 11.6* 10.4 9.3 9.9  NEUTROABS 8.0*  --   --   --   --   --   --   HGB 12.2   < > 11.7* 11.2* 10.7* 10.3* 11.5*  HCT 38.8   < > 38.3 35.4* 33.8* 33.1* 36.1  MCV 103.2*  --  103.0* 102.0* 102.4* 99.7 100.3*  PLT 226  --  212 183 196 211 162   < > = values in this interval not displayed.   Basic Metabolic Panel: Recent Labs  Lab 08/28/20 0155 08/28/20 0155  08/28/20 3606 08/28/20 0647 08/29/20 0111 08/30/20 0457 08/31/20 0230 09/01/20 0808  NA 136   < > 145  --  136 134* 134* 130*  K 3.1*   < > 3.4*  --  3.6 3.4* 3.5 4.0  CL 101  --   --   --  101 97* 97* 95*  CO2 26  --   --   --  _0 GLUCOSE 60*  --   --   --  175* 124* 177* 325*  BUN 15  --   --   --  26* 28* 22 22  CREATININE 0.93   < >  --  0.91 0.99 0.87 0.83 0.85  CALCIUM 8.5*  --   --   --  7.9* 7.8* 7.7* 7.9*  MG  --   --   --   --   --   --   --  1.8  PHOS  --   --   --   --   --   --   --  1.9*   < > = values in this interval not displayed.   GFR: Estimated Creatinine Clearance: 103.2 mL/min (by C-G formula based on SCr  of 0.85 mg/dL). Liver Function Tests: Recent Labs  Lab 08/28/20 0155  AST 111*  ALT 186*  ALKPHOS 41  BILITOT 0.9  PROT 10.9*  ALBUMIN 3.2*   No results for input(s): LIPASE, AMYLASE in the last 168 hours. No results for input(s): AMMONIA in the last 168 hours. Coagulation Profile: No results for input(s): INR, PROTIME in the last 168 hours. Cardiac Enzymes: No results for input(s): CKTOTAL, CKMB, CKMBINDEX, TROPONINI in the last 168 hours. BNP (last 3 results) No results for input(s): PROBNP in the last 8760 hours. HbA1C: No results for input(s): HGBA1C in the last 72 hours. CBG: Recent Labs  Lab 08/31/20 1638 08/31/20 2139 09/01/20 0739 09/01/20 1221 09/01/20 1442  GLUCAP 218* 324* 322* 409* 375*   Lipid Profile: No results for input(s): CHOL, HDL, LDLCALC, TRIG, CHOLHDL, LDLDIRECT in the last 72 hours. Thyroid Function Tests: No results for input(s): TSH, T4TOTAL, FREET4, T3FREE, THYROIDAB in the last 72 hours. Anemia Panel: No results for input(s): VITAMINB12, FOLATE, FERRITIN, TIBC, IRON, RETICCTPCT in the last 72 hours. Sepsis Labs: Recent Labs  Lab 08/28/20 0155 08/28/20 0647  PROCALCITON  --  <0.10  LATICACIDVEN 0.9  --     Recent Results (from the past 240 hour(s))  SARS Coronavirus 2 by RT PCR (hospital order, performed in [facility] hospital lab) Nasopharyngeal Nasopharyngeal Swab     Status: None   Collection Time: 08/28/20  1:38 AM   Specimen: Nasopharyngeal Swab  Result Value Ref Range Status   SARS Coronavirus 2 NEGATIVE NEGATIVE Final    Comment: (NOTE) SARS-CoV-2 target nucleic acids are NOT DETECTED.  The SARS-CoV-2 RNA is generally detectable in upper and lower respiratory specimens during the acute phase of infection. The lowest concentration of SARS-CoV-2 viral copies this assay can detect is 250 copies / mL. A negative result does not preclude SARS-CoV-2 infection and should not be used as the sole basis for treatment or  other patient management decisions.  A negative result may occur with improper specimen collection / handling, submission of specimen other than nasopharyngeal swab, presence of viral mutation(s) within the areas targeted by this assay, and inadequate number of viral copies (<250 copies / mL). A negative result must be combined with clinical observations, patient history, and epidemiological  information.  Fact Sheet for Patients:   StrictlyIdeas.no  Fact Sheet for Healthcare Providers: BankingDealers.co.za  This test is not yet approved or  cleared by the [place] FDA and has been authorized for detection and/or diagnosis of SARS-CoV-2 by FDA under an Emergency Use Authorization (EUA).  This EUA will remain in effect (meaning this test can be used) for the duration of the COVID-19 declaration under Section 564(b)(1) of the Act, 21 U.S.C. section 360bbb-3(b)(1), unless the authorization is terminated or revoked sooner.  Performed at [facility], [place] [address], [place], [place] [address]   Blood culture (routine x 2)     Status: None (Preliminary result)   Collection Time: 08/28/20  1:55 AM   Specimen: BLOOD  Result Value Ref Range Status   Specimen Description BLOOD SITE NOT SPECIFIED  Final   Special Requests   Final    BOTTLES DRAWN AEROBIC AND ANAEROBIC Blood Culture adequate volume   Culture   Final    NO GROWTH 4 DAYS Performed at [facility], [place] [address] [place], [place] [address]    Report Status PENDING  Incomplete  Urine culture     Status: None   Collection Time: 08/28/20  1:55 AM   Specimen: Urine, Random  Result Value Ref Range Status   Specimen Description URINE, RANDOM  Final   Special Requests NONE  Final   Culture   Final    NO GROWTH Performed at [facility], [address] [place], [place] [address]    Report Status 08/29/2020 FINAL  Final  Blood culture (routine x 2)      Status: None (Preliminary result)   Collection Time: 08/28/20  3:15 AM   Specimen: BLOOD RIGHT HAND  Result Value Ref Range Status   Specimen Description BLOOD RIGHT HAND  Final   Special Requests   Final    BOTTLES DRAWN AEROBIC AND ANAEROBIC Blood Culture adequate volume   Culture   Final    NO GROWTH 4 DAYS Performed at [facility], [place] [address] [place], [place] [address]    Report Status PENDING  Incomplete    Radiology Studies: CT ANGIO CHEST PE W OR WO CONTRAST  Result Date: 08/31/2020 CLINICAL DATA:  Increasing shortness of breath over the past 2 days. EXAM: CT ANGIOGRAPHY CHEST WITH CONTRAST TECHNIQUE: Multidetector CT imaging of the chest was performed using the standard protocol during bolus administration of intravenous contrast. Multiplanar CT image reconstructions and MIPs were obtained to evaluate the vascular anatomy. CONTRAST:  56m OMNIPAQUE IOHEXOL 350 MG/ML SOLN Patient was pre-medicated prior to the exam for contrast allergy. COMPARISON:  Radiograph yesterday.  Chest CT 06/11/2020 FINDINGS: Cardiovascular: There are no filling defects within the pulmonary arteries to suggest pulmonary embolus. Dilated main pulmonary artery at 3.7 cm. There is multi chamber cardiomegaly. No pericardial effusion. Aortic atherosclerosis and tortuosity. No aortic aneurysm. Mediastinum/Nodes: Prominent lower paratracheal nodes measure up to 12 mm short axis. There are small bilateral hilar lymph nodes. No suspicious thyroid nodule. No esophageal wall thickening. Lungs/Pleura: There are multifocal ground-glass and patchy opacities throughout both lungs. Areas of septal thickening. Minimal pleural thickening without significant effusion. Trachea and central bronchi are grossly patent, imaging obtained in expiration limiting assessment. Upper Abdomen: Enlarged liver with nodular contours, partially included. No acute findings. Musculoskeletal: Degenerative change in the spine. There are no  acute or suspicious osseous abnormalities. Review of the MIP images confirms the above findings. IMPRESSION: 1. No  pulmonary embolus. 2. Multifocal ground-glass and patchy opacities throughout both lungs. There is also septal thickening. Findings may represent pulmonary edema, multifocal pneumonia, or combination there of. 3. Cardiomegaly. Dilated main pulmonary artery, can be seen with pulmonary arterial hypertension. 4. Prominent lower paratracheal and hilar lymph nodes are likely reactive Aortic Atherosclerosis (ICD10-I70.0). Electronically Signed   By: Keith Rake M.D.   On: 08/31/2020 21:23    Scheduled Meds: . amLODipine  10 mg Oral Daily  . buPROPion  150 mg Oral Daily  . DULoxetine  60 mg Oral QHS  . enoxaparin (LOVENOX) injection  75 mg Subcutaneous Q24H  . furosemide  80 mg Intravenous Q12H  . insulin aspart  0-15 Units Subcutaneous TID WC  . insulin aspart  0-5 Units Subcutaneous QHS  . insulin aspart protamine- aspart  90 Units Subcutaneous TID AC  . lactulose  20 g Oral TID  . magnesium oxide  200 mg Oral QHS  . methylPREDNISolone (SOLU-MEDROL) injection  80 mg Intravenous Q8H  . mycophenolate  720 mg Oral BID  . nadolol  80 mg Oral BID  . omega-3 acid ethyl esters  1 g Oral Daily  . potassium chloride SA  20 mEq Oral Daily  . sodium chloride flush  3 mL Intravenous Q12H  . spironolactone  25 mg Oral Daily     LOS: 4 days   Time spent: 16mn  Jacobi Ryant C Montoya Brandel, DO [facility]  If 7PM-7AM, please contact night-coverage [email or web address]  09/01/2020, 3:19 PM

## 2020-09-01 NOTE — Progress Notes (Signed)
Pt is eating dinner. She wants to go on Bipap at 2230.

## 2020-09-01 NOTE — Interval H&P Note (Signed)
History and Physical Interval Note:  09/01/2020 4:10 PM  Monica Ellis  has presented today for surgery, with the diagnosis of shortness of breath.  The various methods of treatment have been discussed with the patient and family. After consideration of risks, benefits and other options for treatment, the patient has consented to  Procedure(s): RIGHT HEART CATH (N/A) as a surgical intervention.  The patient's history has been reviewed, patient examined, no change in status, stable for surgery.  I have reviewed the patient's chart and labs.  Questions were answered to the patient's satisfaction.     Harveer Sadler

## 2020-09-01 NOTE — Care Management Important Message (Signed)
Important Message  Patient Details  Name: Monica Ellis MRN: [id number] Date of Birth: 1961-05-27   Medicare Important Message Given:  Yes     [name] 09/01/2020, 2:15 PM

## 2020-09-01 NOTE — Progress Notes (Signed)
Per Dr. Audie Box, wedge pressure elevated c/w fluid overload - will increase Lasix to 181m BID and KCl to 226m BID. Jaxsyn Catalfamo PA-C

## 2020-09-01 NOTE — Consult Note (Addendum)
Cardiology Consultation:   Patient ID: Monica Ellis MRN: [id number]; DOB: 1961-12-03  Admit date: 08/28/2020 Date of Consult: 09/01/2020  Primary Care Provider: Ferd Hibbs, NP [facility] Cardiologist: Evalina Field, MD (new) [facility] Electrophysiologist:  None   Patient Profile:   Monica Ellis is a 59 y.o. 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  History of Present Illness:   She reports worsening fatigue and dyspnea ever since having Covid in July 2020. She reports gaining about 100lb a year ago then plateauing in the setting of ongoing prednisone use. She was recently admitted 06/2020 with shortness of breath and hypoxia. Prior to this hospitalization she said she never needed O2. CT chest showed bibasilar atelectasis without acute findings. VQ scan was negative for PE. 2D echo showed normal EF, moderate LVH, normal diastolic parameters, normal RV, mildly dilated LA. She also had lethargy and mild respiratory acidosis requiring BiPAP during admission. She was diuresed with IV Lasix, -6.7L, and her home narcotic dose was reduced. Her respiratory failure was felt multifactorial. Outpatient sleep study was recommended. She did qualify for home O2 with home Trilogy ventilator machine for nocturnal ventilation. She was evaluated by [facility] pulmonology after as outpatient who raised question of the diagnosis of exclusion of a vanishing lung in the setting of her autoimmune disease. She was also prescribed Bactrim for antimicrobial prophylaxis. She had an appointment with her liver specialist a few weeks ago who has been trying to get her off prednisone although  she's been hesitant to make changes to her regimen. She has not been using her nocturnal O2 because she wasn't convinced she needed it and it was also costly.  She presented back to the ED 08/28/2020 with worsening SOB. She also reported a fever of 102 at home, 99.3 on arrival here (TMax 99.9). With these symptoms she had about 30 minutes of right sided chest discomfort. She has had an intermittent cough, sporadic brown sputum, which she states is not unusual and generally infrequent. She also noticed worsening leg edema. She's unsure if she'd had orthopnea because she doesn't typically lay back to rest. Initial workup showed mild leukocytosis, abnormal LFTs, albumin 3.2, BNP 167, hsTroponin 16-18-15 (insignificant trend), normal lactic acid, Covid negative. CXR showed infiltrates at lung bases with superimposed edema. She was started on antibiotics. She was seen by pulmonology due to persistent hypoxia requiring frequent application of BiPAP. CTA yesterday showed no PE, + multifocal ground glass opacities through the lungs (pulm edema, multifocal PNA or combination), dilated main PA, prominent lower paratracheal and hilar lymph nodes likely reactive, aortic atherosclerosis. ESR is 117. Pulmonology recommended aggressive diuresis, transition to Solu Medrol, and cardiology evaluation to consider [place]. Today is the first day she has been able to stay on HFNC instead of BiPAP. Daily weights/I&Os not complete for review, will order.   Past Medical History:  Diagnosis Date  . 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 Brain tumor ([place])    hx of  . Diabetes mellitus without complication ([place])   . GERD (gastroesophageal reflux disease)   . Hypertension   . Lupus ([place])   . Morbid obesity ([place])   . Obesity hypoventilation syndrome ([place])   .  Pneumonia   . Tobacco use disorder     Past Surgical History:  Procedure Laterality Date  . CHOLECYSTECTOMY    . DILATION AND CURETTAGE OF  UTERUS    . EYE SURGERY Bilateral    laser surgery   . IR TRANSCATHETER BX  11/03/2018  . IR US GUIDE VASC ACCESS RIGHT  11/03/2018  . IR VENOGRAM HEPATIC W HEMODYNAMIC EVALUATION  11/03/2018     Home Medications:  Prior to Admission medications   Medication Sig Start Date End Date Taking? Authorizing Provider  amLODipine (NORVASC) 10 MG tablet Take 10 mg by mouth daily. 08/04/20  Yes [provider]  ascorbic acid (VITAMIN C) 500 MG tablet Take 2,000 mg by mouth daily.   Yes [provider]  buPROPion (WELLBUTRIN XL) 150 MG 24 hr tablet Take 150 mg by mouth daily. 05/21/20  Yes [provider]  Cholecalciferol (VITAMIN D PO) Take 1 tablet by mouth every evening.    Yes [provider]  DULoxetine (CYMBALTA) 60 MG capsule Take 60 mg by mouth at bedtime.  08/26/16  Yes [provider]  fexofenadine-pseudoephedrine (ALLEGRA-D 24) 180-240 MG 24 hr tablet Take 1 tablet by mouth daily.   Yes [provider]  furosemide (LASIX) 40 MG tablet Take 1 tablet (40 mg total) by mouth 2 (two) times daily. 06/17/20  Yes Joseph, Preetha, MD  insulin lispro protamine-lispro (HUMALOG 75/25 MIX) (75-25) 100 UNIT/ML SUSP injection Inject 120 Units into the skin 3 (three) times daily with meals. 06/17/20  Yes Joseph, Preetha, MD  lactulose (CHRONULAC) 10 GM/15ML solution Take 30 mLs by mouth 3 (three) times daily as needed for constipation. 11/22/19  Yes [provider]  LEVSIN 0.125 MG tablet Take 0.125 mg by mouth every 4 (four) hours as needed for spasms. 08/08/20  Yes [provider]  LORazepam (ATIVAN) 0.5 MG tablet Take 0.5 mg by mouth 3 (three) times daily as needed for anxiety. 08/27/20  Yes [provider]  magnesium 30 MG tablet Take 30 mg by mouth at bedtime.    Yes [provider]  Multiple Vitamin (MULTIVITAMIN) tablet Take 1 tablet by mouth daily.    Yes [provider]  mycophenolate (MYFORTIC) 360 MG TBEC EC  tablet Take 720 mg by mouth 2 (two) times daily. 05/13/20  Yes [provider]  nadolol (CORGARD) 80 MG tablet Take 80 mg by mouth 2 (two) times daily. 07/06/20  Yes [provider]  NASAL SALINE NA Place 1 spray into the nose daily as needed (Congestion and Allergies).   Yes [provider]  Omega-3 Fatty Acids (FISH OIL PO) Take 1 capsule by mouth every evening.    Yes [provider]  oxyCODONE (OXYCONTIN) 10 mg 12 hr tablet Take 10 mg by mouth every 6 (six) hours as needed for pain.   Yes [provider]  potassium chloride SA (KLOR-CON) 20 MEQ tablet Take 20 mEq by mouth daily. 03/12/20  Yes [provider]  predniSONE (DELTASONE) 5 MG tablet Take 12.5 mg by mouth daily with breakfast.    Yes [provider]  spironolactone (ALDACTONE) 25 MG tablet Take 1 tablet (25 mg total) by mouth daily. 06/18/20  Yes Joseph, Preetha, MD  TURMERIC PO Take 400 mg by mouth daily.    Yes [provider]  zolpidem (AMBIEN) 10 MG tablet Take 10 mg by mouth at bedtime. 05/15/19  Yes [provider]    Inpatient Medications: Scheduled Meds: . amLODipine  10   mg Oral Daily  . buPROPion  150 mg Oral Daily  . DULoxetine  60 mg Oral QHS  . enoxaparin (LOVENOX) injection  75 mg Subcutaneous Q24H  . furosemide  80 mg Intravenous Q12H  . insulin aspart  0-6 Units Subcutaneous TID WC  . insulin aspart protamine- aspart  90 Units Subcutaneous TID AC  . lactulose  20 g Oral TID  . magnesium oxide  200 mg Oral QHS  . methylPREDNISolone (SOLU-MEDROL) injection  80 mg Intravenous Q8H  . mycophenolate  720 mg Oral BID  . nadolol  80 mg Oral BID  . omega-3 acid ethyl esters  1 g Oral Daily  . potassium chloride SA  20 mEq Oral Daily  . spironolactone  25 mg Oral Daily   Continuous Infusions:  PRN Meds: ALPRAZolam, hyoscyamine, ipratropium-albuterol, ondansetron **OR** ondansetron (ZOFRAN) IV  Allergies:    Allergies  Allergen Reactions  .  Ace Inhibitors Swelling  . Imuran [Azathioprine] Nausea And Vomiting    Severe N/V/D and AKI after starting the med.  . Lisinopril Swelling and Other (See Comments)    Other reaction(s): Angioedema (ALLERGY/intolerance), Face  . Protonix [Pantoprazole] Swelling    Pt states that it causes her lips to swell.   . Tramadol Other (See Comments)    Other reaction(s): Mental Status Changes (intolerance)  . Iodinated Diagnostic Agents Swelling    When patient was in her 20's, swelled up all over after getting getting injection of contrast; did not have any other symptoms/ was given benadryl after that happened and had no further problems per patients/  . Iodine Swelling  . Janumet [Sitagliptin-Metformin Hcl] Other (See Comments)    Continuous yeast infection  . Omeprazole Swelling    Lips will swell  . Victoza [Liraglutide] Nausea And Vomiting    Social History:   Social History   Socioeconomic History  . Marital status: Single    Spouse name: Not on file  . Number of children: 2  . Years of education: Not on file  . Highest education level: Bachelor's degree (e.g., BA, AB, BS)  Occupational History    Comment: disabled  Tobacco Use  . Smoking status: Current Every Day Smoker    Packs/day: 1.00    Years: 30.00    Pack years: 30.00    Types: Cigarettes  . Smokeless tobacco: Never Used  Vaping Use  . Vaping Use: Never used  Substance and Sexual Activity  . Alcohol use: No  . Drug use: No  . Sexual activity: Not on file  Other Topics Concern  . Not on file  Social History Narrative   Lives with mother   Caffeine- Diet Coke, 1 Liter   Social Determinants of Health   Financial Resource Strain:   . Difficulty of Paying Living Expenses: Not on file  Food Insecurity:   . Worried About Charity fundraiser in the Last Year: Not on file  . Ran Out of Food in the Last Year: Not on file  Transportation Needs:   . Lack of Transportation (Medical): Not on file  . Lack of  Transportation (Non-Medical): Not on file  Physical Activity:   . Days of Exercise per Week: Not on file  . Minutes of Exercise per Session: Not on file  Stress:   . Feeling of Stress : Not on file  Social Connections:   . Frequency of Communication with Friends and Family: Not on file  . Frequency of Social Gatherings with Friends and Family: Not  on file  . Attends Religious Services: Not on file  . Active Member of Clubs or Organizations: Not on file  . Attends Club or Organization Meetings: Not on file  . Marital Status: Not on file  Intimate Partner Violence:   . Fear of Current or Ex-Partner: Not on file  . Emotionally Abused: Not on file  . Physically Abused: Not on file  . Sexually Abused: Not on file    Family History:    Family History  Problem Relation Age of Onset  . Diabetes Father   . Hypertension Father   . Hypertension Mother   . Kidney cancer Maternal Uncle   . Cancer Maternal Uncle        brain     ROS:  Please see the history of present illness.  All other ROS reviewed and negative.     Physical Exam/Data:   Vitals:   09/01/20 0741 09/01/20 0743 09/01/20 0800 09/01/20 1219  BP: 117/88   (!) 143/54  Pulse: 60 (!) 59  63  Resp: (!) 21  20 13  Temp: 98.5 F (36.9 C)   98.6 F (37 C)  TempSrc: Oral   Oral  SpO2: 99%  93% 94%  Weight:      Height:       No intake or output data in the 24 hours ending 09/01/20 1320 Last 3 Weights 08/29/2020 08/28/2020 06/17/2020  Weight (lbs) 330 lb 0.5 oz 330 lb 326 lb 14.4 oz  Weight (kg) 149.7 kg 149.687 kg 148.281 kg     Body mass index is 54.92 kg/m.  Vital Signs. BP (!) 143/54 (BP Location: Right Wrist)   Pulse 63   Temp 98.6 F (37 C) (Oral)   Resp 13   Ht 5' 5" (1.651 m)   Wt (!) 149.7 kg   SpO2 94%   BMI 54.92 kg/m  General: Well developed, well nourished morbidly obese AAF in no acute distress. Head: Normocephalic, atraumatic, sclera non-icteric, no xanthomas, nares are without discharge. Neck:  Negative for carotid bruits. JVP difficult to assess given neck habitus Lungs: Decreased movement but no wheezes, rales, or rhonchi. Breathing is unlabored. Heart: RRR S1 S2 without murmurs, rubs, or gallops.  Abdomen: Soft, non-tender, non-distended with normoactive bowel sounds. No rebound/guarding. Extremities: No clubbing or cyanosis. Trace BLE edema. Distal pedal pulses are 2+ and equal bilaterally. Neuro: Alert and oriented X 3. Moves all extremities spontaneously. Psych:  Responds to questions appropriately with a normal affect.   EKG:  The EKG was personally reviewed and demonstrates:  Initial tracing 9/23 poor quality NSR with borderline right axis deviation, baseline wander making interpretation difficult 08/30/20 NSR 65bpm no acute STT changes, mild baseline wander   Telemetry:  Telemetry was personally reviewed and demonstrates:  NSR  Relevant CV Studies: 2D echo 06/11/20  1. Left ventricular ejection fraction, by estimation, is 60 to 65%. The  left ventricle has normal function. The left ventricle has no regional  wall motion abnormalities. There is moderate left ventricular hypertrophy.  Left ventricular diastolic  parameters were normal.  2. Right ventricular systolic function is normal. The right ventricular  size is normal.  3. Left atrial size was mildly dilated.  4. The mitral valve is normal in structure. No evidence of mitral valve  regurgitation. No evidence of mitral stenosis.  5. The aortic valve was not well visualized. Aortic valve regurgitation  is not visualized. No aortic stenosis is present.  6. The inferior vena cava is normal in   size with greater than 50%  respiratory variability, suggesting right atrial pressure of 3 mmHg.   Laboratory Data:  High Sensitivity Troponin:   Recent Labs  Lab 08/28/20 0155 08/28/20 0647 08/28/20 0813  TROPONINIHS 16 18* 15     Chemistry Recent Labs  Lab 08/30/20 0457 08/31/20 0230 09/01/20 0808  NA 134*  134* 130*  K 3.4* 3.5 4.0  CL 97* 97* 95*  CO2 _0 GLUCOSE 124* 177* 325*  BUN 28* 22 22  CREATININE 0.87 0.83 0.85  CALCIUM 7.8* 7.7* 7.9*  GFRNONAA >60 >60 >60  GFRAA >60 >60 >60  ANIONGAP _1 Recent Labs  Lab 08/28/20 0155  PROT 10.9*  ALBUMIN 3.2*  AST 111*  ALT 186*  ALKPHOS 41  BILITOT 0.9   Hematology Recent Labs  Lab 08/30/20 0457 08/31/20 0230 09/01/20 0808  WBC 10.4 9.3 9.9  RBC 3.30* 3.32* 3.60*  HGB 10.7* 10.3* 11.5*  HCT 33.8* 33.1* 36.1  MCV 102.4* 99.7 100.3*  MCH 32.4 31.0 31.9  MCHC 31.7 31.1 31.9  RDW 14.7 14.7 14.4  PLT 196 211 162   BNP Recent Labs  Lab 08/28/20 0155  BNP 167.7*    DDimer No results for input(s): DDIMER in the last 168 hours.   Radiology/Studies:  CT ANGIO CHEST PE W OR WO CONTRAST  Result Date: 08/31/2020 CLINICAL DATA:  Increasing shortness of breath over the past 2 days. EXAM: CT ANGIOGRAPHY CHEST WITH CONTRAST TECHNIQUE: Multidetector CT imaging of the chest was performed using the standard protocol during bolus administration of intravenous contrast. Multiplanar CT image reconstructions and MIPs were obtained to evaluate the vascular anatomy. CONTRAST:  34m OMNIPAQUE IOHEXOL 350 MG/ML SOLN Patient was pre-medicated prior to the exam for contrast allergy. COMPARISON:  Radiograph yesterday.  Chest CT 06/11/2020 FINDINGS: Cardiovascular: There are no filling defects within the pulmonary arteries to suggest pulmonary embolus. Dilated main pulmonary artery at 3.7 cm. There is multi chamber cardiomegaly. No pericardial effusion. Aortic atherosclerosis and tortuosity. No aortic aneurysm. Mediastinum/Nodes: Prominent lower paratracheal nodes measure up to 12 mm short axis. There are small bilateral hilar lymph nodes. No suspicious thyroid nodule. No esophageal wall thickening. Lungs/Pleura: There are multifocal ground-glass and patchy opacities throughout both lungs. Areas of septal thickening. Minimal pleural thickening  without significant effusion. Trachea and central bronchi are grossly patent, imaging obtained in expiration limiting assessment. Upper Abdomen: Enlarged liver with nodular contours, partially included. No acute findings. Musculoskeletal: Degenerative change in the spine. There are no acute or suspicious osseous abnormalities. Review of the MIP images confirms the above findings. IMPRESSION: 1. No pulmonary embolus. 2. Multifocal ground-glass and patchy opacities throughout both lungs. There is also septal thickening. Findings may represent pulmonary edema, multifocal pneumonia, or combination there of. 3. Cardiomegaly. Dilated main pulmonary artery, can be seen with pulmonary arterial hypertension. 4. Prominent lower paratracheal and hilar lymph nodes are likely reactive Aortic Atherosclerosis (ICD10-I70.0). Electronically Signed   By: MKeith RakeM.D.   On: 08/31/2020 21:23   DG Chest Port 1 View  Result Date: 08/30/2020 CLINICAL DATA:  Hypoxia EXAM: PORTABLE CHEST 1 VIEW COMPARISON:  Two days ago FINDINGS: Cardiomegaly and vascular pedicle widening. Diffuse interstitial opacity with cephalized blood flow. Streaky and hazy density at the lung bases. No visible effusion or pneumothorax. IMPRESSION: Infiltrates at the lung bases. Superimposed edema is also suspected. Electronically Signed   By: JMonte FantasiaM.D.   On: 08/30/2020 10:23   New York Heart  Association (NYHA) Functional Class NYHA Class IV Although challenging to discern if SOB is cardiac. Assessment and Plan:   1. Acute on chronic respiratory failure, suspect multifactorial - contributing factors possible PNA, acute on chronic diastolic CHF, morbid obesity, underlying lung disease with 40 year history of smoking (quit upon admission), OHS, nonadherence to nocturnal O2 therapy, +/- contribution of prior Covid infection - PCCM thinks less likely vanishing lung given lack of bullae on imaging - clinically improving on IV steroids + IV  diuretics per PCCM - strict I/O's, daily weights re-ordered - given body habitus, ascertaining fluid status is challenging so will discuss RHC with MD  2. Right sided chest pressure - x30 minutes, in context of hypoxia/respiratory issues on admission - troponin low/flat, only 1 value abnormal at 18, do not suspect ACS - no recurrence with supportive therapy  3. Autoimmune hepatitis/lupus - per primary team  4. Hyponatremia - moreso pseudohyponatremia, 130 corrects to 133.6 given hyperglycemia  5. Abnormal HIV screen - reflex value pending  For questions or updates, please contact [facility] Please consult [email or web address] for contact info under    Signed,  N , PA-C  09/01/2020 1:20 PM  

## 2020-09-01 NOTE — H&P (View-Only) (Signed)
Cardiology Consultation:   Patient ID: Monica Ellis MRN: [id number]; DOB: 06/27/1961  Admit date: 08/28/2020 Date of Consult: 09/01/2020  Primary Care Provider: Blevins, Andrea, NP [facility] Cardiologist: Burton T O'Neal, MD (new) [facility] Electrophysiologist:  None   Patient Profile:   Monica Ellis is a 59 y.o. 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  History of Present Illness:   She reports worsening fatigue and dyspnea ever since having Covid in July 2020. She reports gaining about 100lb a year ago then plateauing in the setting of ongoing prednisone use. She was recently admitted 06/2020 with shortness of breath and hypoxia. Prior to this hospitalization she said she never needed O2. CT chest showed bibasilar atelectasis without acute findings. VQ scan was negative for PE. 2D echo showed normal EF, moderate LVH, normal diastolic parameters, normal RV, mildly dilated LA. She also had lethargy and mild respiratory acidosis requiring BiPAP during admission. She was diuresed with IV Lasix, -6.7L, and her home narcotic dose was reduced. Her respiratory failure was felt multifactorial. Outpatient sleep study was recommended. She did qualify for home O2 with home Trilogy ventilator machine for nocturnal ventilation. She was evaluated by [facility] pulmonology after as outpatient who raised question of the diagnosis of exclusion of a vanishing lung in the setting of her autoimmune disease. She was also prescribed Bactrim for antimicrobial prophylaxis. She had an appointment with her liver specialist a few weeks ago who has been trying to get her off prednisone although  she's been hesitant to make changes to her regimen. She has not been using her nocturnal O2 because she wasn't convinced she needed it and it was also costly.  She presented back to the ED 08/28/2020 with worsening SOB. She also reported a fever of 102 at home, 99.3 on arrival here (TMax 99.9). With these symptoms she had about 30 minutes of right sided chest discomfort. She has had an intermittent cough, sporadic brown sputum, which she states is not unusual and generally infrequent. She also noticed worsening leg edema. She's unsure if she'd had orthopnea because she doesn't typically lay back to rest. Initial workup showed mild leukocytosis, abnormal LFTs, albumin 3.2, BNP 167, hsTroponin 16-18-15 (insignificant trend), normal lactic acid, Covid negative. CXR showed infiltrates at lung bases with superimposed edema. She was started on antibiotics. She was seen by pulmonology due to persistent hypoxia requiring frequent application of BiPAP. CTA yesterday showed no PE, + multifocal ground glass opacities through the lungs (pulm edema, multifocal PNA or combination), dilated main PA, prominent lower paratracheal and hilar lymph nodes likely reactive, aortic atherosclerosis. ESR is 117. Pulmonology recommended aggressive diuresis, transition to Solu Medrol, and cardiology evaluation to consider RHC. Today is the first day she has been able to stay on HFNC instead of BiPAP. Daily weights/I&Os not complete for review, will order.   Past Medical History:  Diagnosis Date  . 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 Brain tumor (HCC)    hx of  . Diabetes mellitus without complication (HCC)   . GERD (gastroesophageal reflux disease)   . Hypertension   . Lupus (HCC)   . Morbid obesity (HCC)   . Obesity hypoventilation syndrome (HCC)   .   Pneumonia   . Tobacco use disorder     Past Surgical History:  Procedure Laterality Date  . CHOLECYSTECTOMY    . DILATION AND CURETTAGE OF  UTERUS    . EYE SURGERY Bilateral    laser surgery   . IR TRANSCATHETER BX  11/03/2018  . IR US GUIDE VASC ACCESS RIGHT  11/03/2018  . IR VENOGRAM HEPATIC W HEMODYNAMIC EVALUATION  11/03/2018     Home Medications:  Prior to Admission medications   Medication Sig Start Date End Date Taking? Authorizing Provider  amLODipine (NORVASC) 10 MG tablet Take 10 mg by mouth daily. 08/04/20  Yes [provider]  ascorbic acid (VITAMIN C) 500 MG tablet Take 2,000 mg by mouth daily.   Yes [provider]  buPROPion (WELLBUTRIN XL) 150 MG 24 hr tablet Take 150 mg by mouth daily. 05/21/20  Yes [provider]  Cholecalciferol (VITAMIN D PO) Take 1 tablet by mouth every evening.    Yes [provider]  DULoxetine (CYMBALTA) 60 MG capsule Take 60 mg by mouth at bedtime.  08/26/16  Yes [provider]  fexofenadine-pseudoephedrine (ALLEGRA-D 24) 180-240 MG 24 hr tablet Take 1 tablet by mouth daily.   Yes [provider]  furosemide (LASIX) 40 MG tablet Take 1 tablet (40 mg total) by mouth 2 (two) times daily. 06/17/20  Yes Joseph, Preetha, MD  insulin lispro protamine-lispro (HUMALOG 75/25 MIX) (75-25) 100 UNIT/ML SUSP injection Inject 120 Units into the skin 3 (three) times daily with meals. 06/17/20  Yes Joseph, Preetha, MD  lactulose (CHRONULAC) 10 GM/15ML solution Take 30 mLs by mouth 3 (three) times daily as needed for constipation. 11/22/19  Yes [provider]  LEVSIN 0.125 MG tablet Take 0.125 mg by mouth every 4 (four) hours as needed for spasms. 08/08/20  Yes [provider]  LORazepam (ATIVAN) 0.5 MG tablet Take 0.5 mg by mouth 3 (three) times daily as needed for anxiety. 08/27/20  Yes [provider]  magnesium 30 MG tablet Take 30 mg by mouth at bedtime.    Yes [provider]  Multiple Vitamin (MULTIVITAMIN) tablet Take 1 tablet by mouth daily.    Yes [provider]  mycophenolate (MYFORTIC) 360 MG TBEC EC  tablet Take 720 mg by mouth 2 (two) times daily. 05/13/20  Yes [provider]  nadolol (CORGARD) 80 MG tablet Take 80 mg by mouth 2 (two) times daily. 07/06/20  Yes [provider]  NASAL SALINE NA Place 1 spray into the nose daily as needed (Congestion and Allergies).   Yes [provider]  Omega-3 Fatty Acids (FISH OIL PO) Take 1 capsule by mouth every evening.    Yes [provider]  oxyCODONE (OXYCONTIN) 10 mg 12 hr tablet Take 10 mg by mouth every 6 (six) hours as needed for pain.   Yes [provider]  potassium chloride SA (KLOR-CON) 20 MEQ tablet Take 20 mEq by mouth daily. 03/12/20  Yes [provider]  predniSONE (DELTASONE) 5 MG tablet Take 12.5 mg by mouth daily with breakfast.    Yes [provider]  spironolactone (ALDACTONE) 25 MG tablet Take 1 tablet (25 mg total) by mouth daily. 06/18/20  Yes Joseph, Preetha, MD  TURMERIC PO Take 400 mg by mouth daily.    Yes [provider]  zolpidem (AMBIEN) 10 MG tablet Take 10 mg by mouth at bedtime. 05/15/19  Yes [provider]    Inpatient Medications: Scheduled Meds: . amLODipine  10   mg Oral Daily  . buPROPion  150 mg Oral Daily  . DULoxetine  60 mg Oral QHS  . enoxaparin (LOVENOX) injection  75 mg Subcutaneous Q24H  . furosemide  80 mg Intravenous Q12H  . insulin aspart  0-6 Units Subcutaneous TID WC  . insulin aspart protamine- aspart  90 Units Subcutaneous TID AC  . lactulose  20 g Oral TID  . magnesium oxide  200 mg Oral QHS  . methylPREDNISolone (SOLU-MEDROL) injection  80 mg Intravenous Q8H  . mycophenolate  720 mg Oral BID  . nadolol  80 mg Oral BID  . omega-3 acid ethyl esters  1 g Oral Daily  . potassium chloride SA  20 mEq Oral Daily  . spironolactone  25 mg Oral Daily   Continuous Infusions:  PRN Meds: ALPRAZolam, hyoscyamine, ipratropium-albuterol, ondansetron **OR** ondansetron (ZOFRAN) IV  Allergies:    Allergies  Allergen Reactions  .  Ace Inhibitors Swelling  . Imuran [Azathioprine] Nausea And Vomiting    Severe N/V/D and AKI after starting the med.  . Lisinopril Swelling and Other (See Comments)    Other reaction(s): Angioedema (ALLERGY/intolerance), Face  . Protonix [Pantoprazole] Swelling    Pt states that it causes her lips to swell.   . Tramadol Other (See Comments)    Other reaction(s): Mental Status Changes (intolerance)  . Iodinated Diagnostic Agents Swelling    When patient was in her 20's, swelled up all over after getting getting injection of contrast; did not have any other symptoms/ was given benadryl after that happened and had no further problems per patients/  . Iodine Swelling  . Janumet [Sitagliptin-Metformin Hcl] Other (See Comments)    Continuous yeast infection  . Omeprazole Swelling    Lips will swell  . Victoza [Liraglutide] Nausea And Vomiting    Social History:   Social History   Socioeconomic History  . Marital status: Single    Spouse name: Not on file  . Number of children: 2  . Years of education: Not on file  . Highest education level: Bachelor's degree (e.g., BA, AB, BS)  Occupational History    Comment: disabled  Tobacco Use  . Smoking status: Current Every Day Smoker    Packs/day: 1.00    Years: 30.00    Pack years: 30.00    Types: Cigarettes  . Smokeless tobacco: Never Used  Vaping Use  . Vaping Use: Never used  Substance and Sexual Activity  . Alcohol use: No  . Drug use: No  . Sexual activity: Not on file  Other Topics Concern  . Not on file  Social History Narrative   Lives with mother   Caffeine- Diet Coke, 1 Liter   Social Determinants of Health   Financial Resource Strain:   . Difficulty of Paying Living Expenses: Not on file  Food Insecurity:   . Worried About Charity fundraiser in the Last Year: Not on file  . Ran Out of Food in the Last Year: Not on file  Transportation Needs:   . Lack of Transportation (Medical): Not on file  . Lack of  Transportation (Non-Medical): Not on file  Physical Activity:   . Days of Exercise per Week: Not on file  . Minutes of Exercise per Session: Not on file  Stress:   . Feeling of Stress : Not on file  Social Connections:   . Frequency of Communication with Friends and Family: Not on file  . Frequency of Social Gatherings with Friends and Family: Not  on file  . Attends Religious Services: Not on file  . Active Member of Clubs or Organizations: Not on file  . Attends Club or Organization Meetings: Not on file  . Marital Status: Not on file  Intimate Partner Violence:   . Fear of Current or Ex-Partner: Not on file  . Emotionally Abused: Not on file  . Physically Abused: Not on file  . Sexually Abused: Not on file    Family History:    Family History  Problem Relation Age of Onset  . Diabetes Father   . Hypertension Father   . Hypertension Mother   . Kidney cancer Maternal Uncle   . Cancer Maternal Uncle        brain     ROS:  Please see the history of present illness.  All other ROS reviewed and negative.     Physical Exam/Data:   Vitals:   09/01/20 0741 09/01/20 0743 09/01/20 0800 09/01/20 1219  BP: 117/88   (!) 143/54  Pulse: 60 (!) 59  63  Resp: (!) 21  20 13  Temp: 98.5 F (36.9 C)   98.6 F (37 C)  TempSrc: Oral   Oral  SpO2: 99%  93% 94%  Weight:      Height:       No intake or output data in the 24 hours ending 09/01/20 1320 Last 3 Weights 08/29/2020 08/28/2020 06/17/2020  Weight (lbs) 330 lb 0.5 oz 330 lb 326 lb 14.4 oz  Weight (kg) 149.7 kg 149.687 kg 148.281 kg     Body mass index is 54.92 kg/m.  Vital Signs. BP (!) 143/54 (BP Location: Right Wrist)   Pulse 63   Temp 98.6 F (37 C) (Oral)   Resp 13   Ht 5' 5" (1.651 m)   Wt (!) 149.7 kg   SpO2 94%   BMI 54.92 kg/m  General: Well developed, well nourished morbidly obese AAF in no acute distress. Head: Normocephalic, atraumatic, sclera non-icteric, no xanthomas, nares are without discharge. Neck:  Negative for carotid bruits. JVP difficult to assess given neck habitus Lungs: Decreased movement but no wheezes, rales, or rhonchi. Breathing is unlabored. Heart: RRR S1 S2 without murmurs, rubs, or gallops.  Abdomen: Soft, non-tender, non-distended with normoactive bowel sounds. No rebound/guarding. Extremities: No clubbing or cyanosis. Trace BLE edema. Distal pedal pulses are 2+ and equal bilaterally. Neuro: Alert and oriented X 3. Moves all extremities spontaneously. Psych:  Responds to questions appropriately with a normal affect.   EKG:  The EKG was personally reviewed and demonstrates:  Initial tracing 9/23 poor quality NSR with borderline right axis deviation, baseline wander making interpretation difficult 08/30/20 NSR 65bpm no acute STT changes, mild baseline wander   Telemetry:  Telemetry was personally reviewed and demonstrates:  NSR  Relevant CV Studies: 2D echo 06/11/20  1. Left ventricular ejection fraction, by estimation, is 60 to 65%. The  left ventricle has normal function. The left ventricle has no regional  wall motion abnormalities. There is moderate left ventricular hypertrophy.  Left ventricular diastolic  parameters were normal.  2. Right ventricular systolic function is normal. The right ventricular  size is normal.  3. Left atrial size was mildly dilated.  4. The mitral valve is normal in structure. No evidence of mitral valve  regurgitation. No evidence of mitral stenosis.  5. The aortic valve was not well visualized. Aortic valve regurgitation  is not visualized. No aortic stenosis is present.  6. The inferior vena cava is normal in   size with greater than 50%  respiratory variability, suggesting right atrial pressure of 3 mmHg.   Laboratory Data:  High Sensitivity Troponin:   Recent Labs  Lab 08/28/20 0155 08/28/20 0647 08/28/20 0813  TROPONINIHS 16 18* 15     Chemistry Recent Labs  Lab 08/30/20 0457 08/31/20 0230 09/01/20 0808  NA 134*  134* 130*  K 3.4* 3.5 4.0  CL 97* 97* 95*  CO2 _0 GLUCOSE 124* 177* 325*  BUN 28* 22 22  CREATININE 0.87 0.83 0.85  CALCIUM 7.8* 7.7* 7.9*  GFRNONAA >60 >60 >60  GFRAA >60 >60 >60  ANIONGAP _1 Recent Labs  Lab 08/28/20 0155  PROT 10.9*  ALBUMIN 3.2*  AST 111*  ALT 186*  ALKPHOS 41  BILITOT 0.9   Hematology Recent Labs  Lab 08/30/20 0457 08/31/20 0230 09/01/20 0808  WBC 10.4 9.3 9.9  RBC 3.30* 3.32* 3.60*  HGB 10.7* 10.3* 11.5*  HCT 33.8* 33.1* 36.1  MCV 102.4* 99.7 100.3*  MCH 32.4 31.0 31.9  MCHC 31.7 31.1 31.9  RDW 14.7 14.7 14.4  PLT 196 211 162   BNP Recent Labs  Lab 08/28/20 0155  BNP 167.7*    DDimer No results for input(s): DDIMER in the last 168 hours.   Radiology/Studies:  CT ANGIO CHEST PE W OR WO CONTRAST  Result Date: 08/31/2020 CLINICAL DATA:  Increasing shortness of breath over the past 2 days. EXAM: CT ANGIOGRAPHY CHEST WITH CONTRAST TECHNIQUE: Multidetector CT imaging of the chest was performed using the standard protocol during bolus administration of intravenous contrast. Multiplanar CT image reconstructions and MIPs were obtained to evaluate the vascular anatomy. CONTRAST:  34m OMNIPAQUE IOHEXOL 350 MG/ML SOLN Patient was pre-medicated prior to the exam for contrast allergy. COMPARISON:  Radiograph yesterday.  Chest CT 06/11/2020 FINDINGS: Cardiovascular: There are no filling defects within the pulmonary arteries to suggest pulmonary embolus. Dilated main pulmonary artery at 3.7 cm. There is multi chamber cardiomegaly. No pericardial effusion. Aortic atherosclerosis and tortuosity. No aortic aneurysm. Mediastinum/Nodes: Prominent lower paratracheal nodes measure up to 12 mm short axis. There are small bilateral hilar lymph nodes. No suspicious thyroid nodule. No esophageal wall thickening. Lungs/Pleura: There are multifocal ground-glass and patchy opacities throughout both lungs. Areas of septal thickening. Minimal pleural thickening  without significant effusion. Trachea and central bronchi are grossly patent, imaging obtained in expiration limiting assessment. Upper Abdomen: Enlarged liver with nodular contours, partially included. No acute findings. Musculoskeletal: Degenerative change in the spine. There are no acute or suspicious osseous abnormalities. Review of the MIP images confirms the above findings. IMPRESSION: 1. No pulmonary embolus. 2. Multifocal ground-glass and patchy opacities throughout both lungs. There is also septal thickening. Findings may represent pulmonary edema, multifocal pneumonia, or combination there of. 3. Cardiomegaly. Dilated main pulmonary artery, can be seen with pulmonary arterial hypertension. 4. Prominent lower paratracheal and hilar lymph nodes are likely reactive Aortic Atherosclerosis (ICD10-I70.0). Electronically Signed   By: MKeith RakeM.D.   On: 08/31/2020 21:23   DG Chest Port 1 View  Result Date: 08/30/2020 CLINICAL DATA:  Hypoxia EXAM: PORTABLE CHEST 1 VIEW COMPARISON:  Two days ago FINDINGS: Cardiomegaly and vascular pedicle widening. Diffuse interstitial opacity with cephalized blood flow. Streaky and hazy density at the lung bases. No visible effusion or pneumothorax. IMPRESSION: Infiltrates at the lung bases. Superimposed edema is also suspected. Electronically Signed   By: JMonte FantasiaM.D.   On: 08/30/2020 10:23   New York Heart  Association (NYHA) Functional Class NYHA Class IV Although challenging to discern if SOB is cardiac. Assessment and Plan:   1. Acute on chronic respiratory failure, suspect multifactorial - contributing factors possible PNA, acute on chronic diastolic CHF, morbid obesity, underlying lung disease with 40 year history of smoking (quit upon admission), OHS, nonadherence to nocturnal O2 therapy, +/- contribution of prior Covid infection - PCCM thinks less likely vanishing lung given lack of bullae on imaging - clinically improving on IV steroids + IV  diuretics per PCCM - strict I/O's, daily weights re-ordered - given body habitus, ascertaining fluid status is challenging so will discuss RHC with MD  2. Right sided chest pressure - x30 minutes, in context of hypoxia/respiratory issues on admission - troponin low/flat, only 1 value abnormal at 18, do not suspect ACS - no recurrence with supportive therapy  3. Autoimmune hepatitis/lupus - per primary team  4. Hyponatremia - moreso pseudohyponatremia, 130 corrects to 133.6 given hyperglycemia  5. Abnormal HIV screen - reflex value pending  For questions or updates, please contact [facility] Please consult [email or web address] for contact info under    Signed, Morgane Joerger N Rollan Roger, PA-C  09/01/2020 1:20 PM  

## 2020-09-01 NOTE — Progress Notes (Signed)
PCCM Interval Progress Note  Discussed with Dr. Avon Gully.  CTA neg for PE.  Autoimmune labs and f/u echo pending. Cards following, continue diuresis and consider RHC.  Pulmonary outpatient follow up appointment has been scheduled for Thursday 10/02/20 at 9:30AM with Dr. Chase Caller.    Montey Hora, [facility] 09/01/2020, 9:48 AM

## 2020-09-02 ENCOUNTER — Inpatient Hospital Stay (HOSPITAL_COMMUNITY)

## 2020-09-02 ENCOUNTER — Encounter (HOSPITAL_COMMUNITY): Payer: Self-pay | Admitting: Internal Medicine

## 2020-09-02 DIAGNOSIS — J9601 Acute respiratory failure with hypoxia: Secondary | ICD-10-CM

## 2020-09-02 LAB — CULTURE, BLOOD (ROUTINE X 2)
Culture: NO GROWTH
Culture: NO GROWTH
Special Requests: ADEQUATE
Special Requests: ADEQUATE

## 2020-09-02 LAB — LUPUS ANTICOAGULANT PANEL
DRVVT: 32.3 s (ref 0.0–47.0)
PTT Lupus Anticoagulant: 25.4 s (ref 0.0–51.9)

## 2020-09-02 LAB — CBC
HCT: 35.6 % — ABNORMAL LOW (ref 36.0–46.0)
Hemoglobin: 11.7 g/dL — ABNORMAL LOW (ref 12.0–15.0)
MCH: 32.1 pg (ref 26.0–34.0)
MCHC: 32.9 g/dL (ref 30.0–36.0)
MCV: 97.8 fL (ref 80.0–100.0)
Platelets: 281 10*3/uL (ref 150–400)
RBC: 3.64 MIL/uL — ABNORMAL LOW (ref 3.87–5.11)
RDW: 14.3 % (ref 11.5–15.5)
WBC: 15.6 10*3/uL — ABNORMAL HIGH (ref 4.0–10.5)
nRBC: 0.3 % — ABNORMAL HIGH (ref 0.0–0.2)

## 2020-09-02 LAB — BASIC METABOLIC PANEL
Anion gap: 11 (ref 5–15)
BUN: 35 mg/dL — ABNORMAL HIGH (ref 8–23)
CO2: 29 mmol/L (ref 22–32)
Calcium: 8.4 mg/dL — ABNORMAL LOW (ref 8.9–10.3)
Chloride: 94 mmol/L — ABNORMAL LOW (ref 98–111)
Creatinine, Ser: 0.95 mg/dL (ref 0.44–1.00)
GFR calc Af Amer: >60 mL/min (ref >60)
GFR calc non Af Amer: >60 mL/min (ref >60)
Glucose, Bld: 145 mg/dL — ABNORMAL HIGH (ref 70–99)
Potassium: 3.5 mmol/L (ref 3.5–5.1)
Sodium: 134 mmol/L — ABNORMAL LOW (ref 135–145)

## 2020-09-02 LAB — GLUCOSE, CAPILLARY
Glucose-Capillary: 190 mg/dL — ABNORMAL HIGH (ref 70–99)
Glucose-Capillary: 321 mg/dL — ABNORMAL HIGH (ref 70–99)
Glucose-Capillary: 160 mg/dL — ABNORMAL HIGH (ref 70–99)
Glucose-Capillary: 214 mg/dL — ABNORMAL HIGH (ref 70–99)

## 2020-09-02 LAB — ECHOCARDIOGRAM COMPLETE
Area-P 1/2: 2.8 cm2
Height: 65 in
S' Lateral: 2.9 cm
Weight: 5280.46 oz

## 2020-09-02 LAB — C4 COMPLEMENT: Complement C4, Body Fluid: 16 mg/dL (ref 12–38)

## 2020-09-02 LAB — ANTI-DNA ANTIBODY, DOUBLE-STRANDED: ds DNA Ab: 1 IU/mL (ref 0–9)

## 2020-09-02 LAB — PHOSPHORUS: Phosphorus: 2 mg/dL — ABNORMAL LOW (ref 2.5–4.6)

## 2020-09-02 LAB — C3 COMPLEMENT: C3 Complement: 168 mg/dL — ABNORMAL HIGH (ref 82–167)

## 2020-09-02 LAB — ANTINUCLEAR ANTIBODIES, IFA: ANA Ab, IFA: NEGATIVE

## 2020-09-02 MED ORDER — POTASSIUM CHLORIDE CRYS ER 20 MEQ PO TBCR
40.0000 meq | EXTENDED_RELEASE_TABLET | Freq: Two times a day (BID) | ORAL | Status: DC
  Administered 2020-09-02 – 2020-09-05 (×6): 40 meq via ORAL
  Filled 2020-09-02 (×6): qty 2

## 2020-09-02 MED ORDER — POTASSIUM CHLORIDE CRYS ER 20 MEQ PO TBCR
40.0000 meq | EXTENDED_RELEASE_TABLET | Freq: Once | ORAL | Status: AC
  Administered 2020-09-02: 40 meq via ORAL
  Filled 2020-09-02: qty 2

## 2020-09-02 MED ORDER — DIPHENHYDRAMINE HCL 25 MG PO CAPS
25.0000 mg | ORAL_CAPSULE | Freq: Once | ORAL | Status: AC
  Administered 2020-09-02: 25 mg via ORAL
  Filled 2020-09-02: qty 1

## 2020-09-02 NOTE — Progress Notes (Signed)
Echocardiogram 2D Echocardiogram has been performed.  Monica Ellis 09/02/2020, 10:04 AM

## 2020-09-02 NOTE — Progress Notes (Addendum)
PROGRESS NOTE    Monica Ellis  JQZ:[id number] DOB: Dec 26, 1960 DOA: 08/28/2020 PCP: Ferd Hibbs, NP   Brief Narrative:  Monica Ellis is a 58 y.o. female with history of autoimmune hepatitis/lupus, morbid obesity, diabetes mellitus type 2, tobacco abuse, obesity hypoventilation syndrome/COPD presents to the ER because of increasing shortness of breath over the last 2 days.  Patient states she has become progressively short of breath over the last few days but in the last 2 days with some productive cough.  Has been some chest pressure and also has gained about 100 pounds per the last few months as per the patient.  Patient was just recently discharged 2 months ago after being admitted for respiratory failure at the time patient was diuresed. In the ER patient's troponins were negative BNP was 167 Covid test was negative chest x-ray shows bilateral infiltrates patient was hypoxic requiring nonrebreather mildly lethargic but answering questions appropriately.  Patient's labs are showing mildly elevated LFTs consistent with her patient's known history of autoimmune hepatitis.  Since chest x-ray showing bilateral infiltrates patient was started on empiric antibiotics for pneumonia and admitted for further management.    Assessment & Plan:   Principal Problem:   Acute on chronic respiratory failure with hypoxia (HCC) Active Problems:   Uncontrolled type 2 diabetes mellitus (HCC)   Lupus (HCC)   Autoimmune hepatitis ([place])   Essential hypertension   Class 3 severe obesity due to excess calories with serious comorbidity and body mass index (BMI) of 40.0 to 44.9 in adult [facility])    Acute on chronic respiratory failure with hypoxia and hypercarbia, POA In the setting of volume overload/pulmonary edema, improving Likely concurrent obstructive sleep apnea and obesity hypoventilation, POA - Lower extremity edema, orthopnea, bibasilar rales all consistent with volume overload at  admission; recent history of similar episode in presentation with resolution on diuretics - Concern her volume overload status is related to autoimmune hepatitis/lupus as below given unremarkable findings thus far - Previous echo unremarkable -repeat echo pending 09/02/2020 - Cardiology consulted for further insight and recommendations -right heart cath consistent with volume overload - CTA negative for PE, notable for septal thickening and moderate edema - Restrict diet, fluid restrict and salt restriction to improve fluid mobility - Increase lasix 120 milligrams IV twice daily per cardiology, creatinine and potassium stable - Pulmonology consulted - CTA negative - Last EF measured in July 2021 was 60 to 62% without systolic or diastolic dysfunction.   - Procalcitonin negative, discontinue antibiotics, remains afebrile - Continue to wean oxygen down to baseline which appears to be 2 to 3 L nasal cannula around-the-clock -Patient admits to morning headaches, somnolence at baseline and feeling poorly rested consistent with productive sleep apnea, previously unable to follow-up for sleep study, will offer BiPAP here at night and as needed with sleeping, recommend close monitoring with PCP and pulmonology for sleep study as soon as can be scheduled outpatient. - Offered very low-dose anxiolytic for BiPAP tolerance - Markedly improving oxygen over the past 24 hours now off of BiPAP on nasal cannula at 6 L with sats in the mid to high 90s.  Will likely require ongoing oxygen ambulation studies to ensure no profound hypoxia with exertion prior to disposition. SpO2: 94 % O2 Flow Rate (L/min): 6 L/min FiO2 (%): 50 %  Autoimmune hepatitis with elevated LFTs on mycophenolate and prednisone.  Continue home medications nadolol, spironolactone, lactulose Continue methylprednisolone per PCCM  Diabetes mellitus type 2  Continue NovoLog 70/30 90 units 3 times  daily for now until we make sure patient blood  sugar stable.   Increase sliding scale insulin given increased glucose readings with high-dose steroids Continue hypoglycemic protocol, sliding scale insulin Lab Results  Component Value Date   HGBA1C 6.2 (H) 08/28/2020   History of lupus  Continue methylprednisolone as above Unclear if related to or exacerbating patient's pulmonary status as above  COPD, not in acute exacerbation  Continue nebulizer and steroids. Without overt wheeze, unlikely an exacerbation  Morbid obesity Estimated body mass index is 54.92 kg/m as calculated from the following:   Height as of this encounter: _0  (1.651 m).   Weight as of this encounter: 149.7 kg. Lengthy discussion at bedside on need for dietary and lifestyle changes Likely exacerbating patient's above hypoxia with obesity hypoventilation syndrome as well as likely obstructive sleep apnea   DVT prophylaxis: Lovenox. Code Status: DNR Family Communication: Called patient's daughter at bedside  Status is: Patient  Dispo: The patient is from: Home             Anticipated d/c is to: To be determined             Anticipated d/c date is: 48 to 72 hours pending clinical course             Patient currently not medically stable for discharge in the setting of acute hypoxic hypercarbic respiratory failure volume overload need for close monitoring and supplemental oxygen well above baseline.  Consultants:   None  Procedures:   None  Antimicrobials:  Azithromycin/ceftriaxone discontinued 08/28/2020  Subjective: No acute issues or events overnight, patient tolerated BiPAP well overnight, now on 6 L nasal cannula and feels markedly improved from admission.  She still reports orthopnea and marked dyspnea with exertion but again markedly improved from previous just not yet back to baseline.  She otherwise denies nausea, vomiting, diarrhea, constipation, headache, fevers, chills.  Objective: Vitals:   09/02/20 0000 09/02/20 0534 09/02/20 0550  09/02/20 0814  BP: (!) 136/53  (!) 145/61 (!) 154/76  Pulse:  (!) 54 (!) 54 (!) 57  Resp: _1 Temp:   97.9 F (36.6 C) 99.1 F (37.3 C)  TempSrc:   Axillary Oral  SpO2: 95% 96% 97% 94%  Weight:      Height:        Intake/Output Summary (Last 24 hours) at 09/02/2020 1116 Last data filed at 09/02/2020 0818 Gross per 24 hour  Intake 542 ml  Output 325 ml  Net 217 ml   Filed Weights   08/28/20 0135 08/29/20 0500  Weight: (!) 149.7 kg (!) 149.7 kg    Examination:  General: Pleasantly resting in bed, no acute distress, tolerating BiPAP well HEENT: Normocephalic atraumatic.  Sclerae nonicteric, noninjected.  Extraocular movements intact bilaterally. Neck: Without mass or deformity.  Trachea is midline. Lungs: Without overt rales rhonchi or wheeze Heart: Regular rate and rhythm.  Without murmurs, rubs, or gallops. Abdomen: Soft, nontender, nondistended.  Without guarding or rebound. Extremities: 1+ pitting edema bilateral lower extremity Vascular: Dorsalis pedis and posterior tibial pulses palpable bilaterally. Skin: Warm and dry, no erythema, no ulcerations.  Data Reviewed: I have personally reviewed following labs and imaging studies  CBC: Recent Labs  Lab 08/28/20 0155 08/28/20 0611 08/29/20 0111 08/29/20 0111 08/30/20 0457 08/30/20 0457 08/31/20 0230 08/31/20 0230 09/01/20 0808 09/01/20 1645 09/01/20 1646 09/01/20 1650 09/02/20 0327  WBC 11.5*   < > 11.6*  --  10.4  --  9.3  --  9.9  --   --   --  15.6*  NEUTROABS 8.0*  --   --   --   --   --   --   --   --   --   --   --   --   HGB 12.2   < > 11.2*   < > 10.7*   < > 10.3*   < > 11.5* 13.6 13.3 12.9 11.7*  HCT 38.8   < > 35.4*   < > 33.8*   < > 33.1*   < > 36.1 40.0 39.0 38.0 35.6*  MCV 103.2*   < > 102.0*  --  102.4*  --  99.7  --  100.3*  --   --   --  97.8  PLT 226   < > 183  --  196  --  211  --  162  --   --   --  281   < > = values in this interval not displayed.   Basic Metabolic Panel: Recent  Labs  Lab 08/29/20 0111 08/29/20 0111 08/30/20 0457 08/30/20 0457 08/31/20 0230 08/31/20 0230 09/01/20 0808 09/01/20 1645 09/01/20 1646 09/01/20 1650 09/02/20 0327  NA 136   < > 134*   < > 134*   < > 130* 141 140 142 134*  K 3.6   < > 3.4*   < > 3.5   < > 4.0 3.5 3.5 3.3* 3.5  CL 101  --  97*  --  97*  --  95*  --   --   --  94*  CO2 28  --  28  --  30  --  27  --   --   --  29  GLUCOSE 175*  --  124*  --  177*  --  325*  --   --   --  145*  BUN 26*  --  28*  --  22  --  22  --   --   --  35*  CREATININE 0.99  --  0.87  --  0.83  --  0.85  --   --   --  0.95  CALCIUM 7.9*  --  7.8*  --  7.7*  --  7.9*  --   --   --  8.4*  MG  --   --   --   --   --   --  1.8  --   --   --   --   PHOS  --   --   --   --   --   --  1.9*  --   --   --  2.0*   < > = values in this interval not displayed.   GFR: Estimated Creatinine Clearance: 92.4 mL/min (by C-G formula based on SCr of 0.95 mg/dL). Liver Function Tests: Recent Labs  Lab 08/28/20 0155  AST 111*  ALT 186*  ALKPHOS 41  BILITOT 0.9  PROT 10.9*  ALBUMIN 3.2*   No results for input(s): LIPASE, AMYLASE in the last 168 hours. No results for input(s): AMMONIA in the last 168 hours. Coagulation Profile: No results for input(s): INR, PROTIME in the last 168 hours. Cardiac Enzymes: No results for input(s): CKTOTAL, CKMB, CKMBINDEX, TROPONINI in the last 168 hours. BNP (last 3 results) No results for input(s): PROBNP in the last 8760 hours. HbA1C: No results for input(s): HGBA1C in the last 72 hours. CBG: Recent Labs  Lab 09/01/20 1221 09/01/20 1442 09/01/20 1715 09/01/20 2345 09/02/20 0812  GLUCAP 409* 375* 215* 188* 190*   Lipid Profile: No results for input(s): CHOL, HDL, LDLCALC, TRIG, CHOLHDL, LDLDIRECT in the last 72 hours. Thyroid Function Tests: No results for input(s): TSH, T4TOTAL, FREET4, T3FREE, THYROIDAB in the last 72 hours. Anemia Panel: No results for input(s): VITAMINB12, FOLATE, FERRITIN, TIBC, IRON,  RETICCTPCT in the last 72 hours. Sepsis Labs: Recent Labs  Lab 08/28/20 0155 08/28/20 0647  PROCALCITON  --  <0.10  LATICACIDVEN 0.9  --     Recent Results (from the past 240 hour(s))  SARS Coronavirus 2 by RT PCR (hospital order, performed in [facility] hospital lab) Nasopharyngeal Nasopharyngeal Swab     Status: None   Collection Time: 08/28/20  1:38 AM   Specimen: Nasopharyngeal Swab  Result Value Ref Range Status   SARS Coronavirus 2 NEGATIVE NEGATIVE Final    Comment: (NOTE) SARS-CoV-2 target nucleic acids are NOT DETECTED.  The SARS-CoV-2 RNA is generally detectable in upper and lower respiratory specimens during the acute phase of infection. The lowest concentration of SARS-CoV-2 viral copies this assay can detect is 250 copies / mL. A negative result does not preclude SARS-CoV-2 infection and should not be used as the sole basis for treatment or other patient management decisions.  A negative result may occur with improper specimen collection / handling, submission of specimen other than nasopharyngeal swab, presence of viral mutation(s) within the areas targeted by this assay, and inadequate number of viral copies (<250 copies / mL). A negative result must be combined with clinical observations, patient history, and epidemiological information.  Fact Sheet for Patients:   StrictlyIdeas.no  Fact Sheet for Healthcare Providers: BankingDealers.co.za  This test is not yet approved or  cleared by the [place] FDA and has been authorized for detection and/or diagnosis of SARS-CoV-2 by FDA under an Emergency Use Authorization (EUA).  This EUA will remain in effect (meaning this test can be used) for the duration of the COVID-19 declaration under Section 564(b)(1) of the Act, 21 U.S.C. section 360bbb-3(b)(1), unless the authorization is terminated or revoked sooner.  Performed at [facility], [place] [address] [place], [place] [address]   Blood culture (routine x 2)     Status: None   Collection Time: 08/28/20  1:55 AM   Specimen: BLOOD  Result Value Ref Range Status   Specimen Description BLOOD SITE NOT SPECIFIED  Final   Special Requests   Final    BOTTLES DRAWN AEROBIC AND ANAEROBIC Blood Culture adequate volume   Culture   Final    NO GROWTH 5 DAYS Performed at [facility], [address] [place], [place] [address]    Report Status 09/02/2020 FINAL  Final  Urine culture     Status: None   Collection Time: 08/28/20  1:55 AM   Specimen: Urine, Random  Result Value Ref Range Status   Specimen Description URINE, RANDOM  Final   Special Requests NONE  Final   Culture   Final    NO GROWTH Performed at [facility], [place] [address] [place], [place] [address]    Report Status 08/29/2020 FINAL  Final  Blood culture (routine x 2)     Status: None   Collection Time: 08/28/20  3:15 AM   Specimen: BLOOD RIGHT HAND  Result Value Ref Range Status   Specimen Description BLOOD RIGHT HAND  Final   Special Requests   Final  BOTTLES DRAWN AEROBIC AND ANAEROBIC Blood Culture adequate volume   Culture   Final    NO GROWTH 5 DAYS Performed at [facility], [place] [address], [place], [place] [address]    Report Status 09/02/2020 FINAL  Final    Radiology Studies: CT ANGIO CHEST PE W OR WO CONTRAST  Result Date: 08/31/2020 CLINICAL DATA:  Increasing shortness of breath over the past 2 days. EXAM: CT ANGIOGRAPHY CHEST WITH CONTRAST TECHNIQUE: Multidetector CT imaging of the chest was performed using the standard protocol during bolus administration of intravenous contrast. Multiplanar CT image reconstructions and MIPs were obtained to evaluate the vascular anatomy. CONTRAST:  88m OMNIPAQUE IOHEXOL 350 MG/ML SOLN Patient was pre-medicated prior to the exam for contrast allergy. COMPARISON:  Radiograph yesterday.  Chest CT 06/11/2020 FINDINGS: Cardiovascular: There are no filling  defects within the pulmonary arteries to suggest pulmonary embolus. Dilated main pulmonary artery at 3.7 cm. There is multi chamber cardiomegaly. No pericardial effusion. Aortic atherosclerosis and tortuosity. No aortic aneurysm. Mediastinum/Nodes: Prominent lower paratracheal nodes measure up to 12 mm short axis. There are small bilateral hilar lymph nodes. No suspicious thyroid nodule. No esophageal wall thickening. Lungs/Pleura: There are multifocal ground-glass and patchy opacities throughout both lungs. Areas of septal thickening. Minimal pleural thickening without significant effusion. Trachea and central bronchi are grossly patent, imaging obtained in expiration limiting assessment. Upper Abdomen: Enlarged liver with nodular contours, partially included. No acute findings. Musculoskeletal: Degenerative change in the spine. There are no acute or suspicious osseous abnormalities. Review of the MIP images confirms the above findings. IMPRESSION: 1. No pulmonary embolus. 2. Multifocal ground-glass and patchy opacities throughout both lungs. There is also septal thickening. Findings may represent pulmonary edema, multifocal pneumonia, or combination there of. 3. Cardiomegaly. Dilated main pulmonary artery, can be seen with pulmonary arterial hypertension. 4. Prominent lower paratracheal and hilar lymph nodes are likely reactive Aortic Atherosclerosis (ICD10-I70.0). Electronically Signed   By: MKeith RakeM.D.   On: 08/31/2020 21:23   CARDIAC CATHETERIZATION  Result Date: 09/01/2020 Findings: RA = 15 RV = 80/18 PA = 80/35 (51) PCW = 26 Fick cardiac output/index = 7.2/2.9 PVR = 3.5 WU FA sat = 93% PA sat = 64%, 66% SVC sat = 60% Assessment: 1. 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 Moderately elevated left-sided filling pressures 3. High cardiac output with PVR 3.5 WU (given patient's body habitus this may be overestimated by Fick equation. Thermodilution not available on 5FR catheter) 4. 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 Continue diuresis and BIPAP. Will need weight loss. DGlori Bickers MD 5:00 PM   ECHOCARDIOGRAM COMPLETE  Result Date: 09/02/2020    ECHOCARDIOGRAM REPORT   [facility] Date of Exam: 09/02/2020 Medical Rec #:  [id number]           Height:       65.0 in Accession #:    [id number]          Weight:       330.0 lb Date of Birth:  3Aug 05, 1962           BSA:          2.447 m Patient Age:    89years             BP:           154/76 mmHg Patient Gender: F  HR:           57 bpm. Exam Location:  Inpatient Procedure: 2D Echo Indications:    hypoxia  History:        Patient has prior history of Echocardiogram examinations, most                 recent 06/11/2020. COPD; Risk Factors:Diabetes. Tobacco use                 disorder.  Sonographer:    Jannett Celestine RDCS (AE) Referring Phys: 25 [place]  Sonographer Comments: Technically difficult study due to poor echo windows. IMPRESSIONS  1. Limited images- technically difficult, Definity not given  2. LV E' velocity is low at 5-6 cm/sec. Left ventricular ejection fraction, by estimation, is 55 to 60%. The left ventricle has normal function. The left ventricle has no regional wall motion abnormalities. There is moderate left ventricular hypertrophy. Left ventricular diastolic parameters are consistent with Grade II diastolic dysfunction (pseudonormalization). Elevated left ventricular end-diastolic pressure. The E/e' is 21.  3. Right ventricular systolic function is low normal. The right ventricular size is mildly enlarged. Tricuspid regurgitation signal is inadequate for assessing PA pressure.  4. The mitral valve was not well visualized. No evidence of mitral valve regurgitation.  5. The aortic valve was not well visualized. Aortic valve regurgitation is not visualized.  6. The inferior vena cava is normal in size with <50% respiratory variability,  suggesting right atrial pressure of 8 mmHg. Comparison(s): Changes from prior study are noted. 06/11/2020: Limited windows, LVEF 60-65%, moderate LVH, ? normal diastolic function. Conclusion(s)/Recommendation(s): Findings suggest volume overload may be contributing to hypoxia - echo windows were poor, consider right heart cath if volume assessment is challenging. FINDINGS  Left Ventricle: LV E' velocity is low at 5-6 cm/sec. Left ventricular ejection fraction, by estimation, is 55 to 60%. The left ventricle has normal function. The left ventricle has no regional wall motion abnormalities. The left ventricular internal cavity size was normal in size. There is moderate left ventricular hypertrophy. Left ventricular diastolic parameters are consistent with Grade II diastolic dysfunction (pseudonormalization). Elevated left ventricular end-diastolic pressure. The E/e' is 21. Right Ventricle: The right ventricular size is mildly enlarged. No increase in right ventricular wall thickness. Right ventricular systolic function is low normal. Tricuspid regurgitation signal is inadequate for assessing PA pressure. Left Atrium: Left atrial size was normal in size. Right Atrium: Right atrial size was normal in size. Pericardium: There is no evidence of pericardial effusion. Mitral Valve: The mitral valve was not well visualized. No evidence of mitral valve regurgitation. Tricuspid Valve: The tricuspid valve is not well visualized. Tricuspid valve regurgitation is not demonstrated. Aortic Valve: The aortic valve was not well visualized. Aortic valve regurgitation is not visualized. Pulmonic Valve: The pulmonic valve was grossly normal. Pulmonic valve regurgitation is trivial. Aorta: Aortic root could not be assessed. Venous: The inferior vena cava is normal in size with less than 50% respiratory variability, suggesting right atrial pressure of 8 mmHg. IAS/Shunts: The interatrial septum was not assessed.  LEFT VENTRICLE PLAX 2D  LVIDd:         4.10 cm  Diastology LVIDs:         2.90 cm  LV e' medial:    5.77 cm/s LV PW:         1.40 cm  LV E/e' medial:  24.6 LV IVS:        1.40 cm  LV e' lateral:   8.27  cm/s LVOT diam:     2.10 cm  LV E/e' lateral: 17.2 LV SV:         105 LV SV Index:   43 LVOT Area:     3.46 cm  RIGHT VENTRICLE RV S prime:     12.50 cm/s LEFT ATRIUM             Index LA diam:        4.70 cm 1.92 cm/m LA Vol (A2C):   30.5 ml 12.46 ml/m LA Vol (A4C):   39.0 ml 15.94 ml/m LA Biplane Vol: 38.0 ml 15.53 ml/m  AORTIC VALVE LVOT Vmax:   130.00 cm/s LVOT Vmean:  94.400 cm/s LVOT VTI:    0.304 m  AORTA Ao Root diam: 3.00 cm MITRAL VALVE MV Area (PHT): 2.80 cm     SHUNTS MV Decel Time: 271 msec     Systemic VTI:  0.30 m MV E velocity: 142.00 cm/s  Systemic Diam: 2.10 cm MV A velocity: 72.20 cm/s MV E/A ratio:  1.97 Lyman Bishop MD Electronically signed by Lyman Bishop MD Signature Date/Time: 09/02/2020/10:21:54 AM    Final     Scheduled Meds: . amLODipine  10 mg Oral Daily  . buPROPion  150 mg Oral Daily  . DULoxetine  60 mg Oral QHS  . enoxaparin (LOVENOX) injection  75 mg Subcutaneous Q24H  . insulin aspart  0-15 Units Subcutaneous TID WC  . insulin aspart  0-5 Units Subcutaneous QHS  . insulin aspart protamine- aspart  90 Units Subcutaneous TID AC  . lactulose  20 g Oral TID  . magnesium oxide  200 mg Oral QHS  . methylPREDNISolone (SOLU-MEDROL) injection  80 mg Intravenous Q8H  . mycophenolate  720 mg Oral BID  . nadolol  80 mg Oral BID  . omega-3 acid ethyl esters  1 g Oral Daily  . potassium chloride  40 mEq Oral BID  . potassium chloride  40 mEq Oral Once  . sodium chloride flush  3 mL Intravenous Q12H  . spironolactone  25 mg Oral Daily   . furosemide 120 mg (09/02/20 0953)    LOS: 5 days   Time spent: 53mn  Kensey Luepke C Valetta Mulroy, DO [facility]  If 7PM-7AM, please contact night-coverage [email or web address]  09/02/2020, 11:16 AM

## 2020-09-02 NOTE — Progress Notes (Signed)
Progress Note  Patient Name: Monica Ellis Date of Encounter: 09/02/2020  [facility] Cardiologist: Evalina Field, MD   Subjective   Patient reported good diuresis however no strict I&O reported.  No daily weight.  Breathing same.  Denies chest pain.  Inpatient Medications    Scheduled Meds: . amLODipine  10 mg Oral Daily  . buPROPion  150 mg Oral Daily  . DULoxetine  60 mg Oral QHS  . enoxaparin (LOVENOX) injection  75 mg Subcutaneous Q24H  . insulin aspart  0-15 Units Subcutaneous TID WC  . insulin aspart  0-5 Units Subcutaneous QHS  . insulin aspart protamine- aspart  90 Units Subcutaneous TID AC  . lactulose  20 g Oral TID  . magnesium oxide  200 mg Oral QHS  . methylPREDNISolone (SOLU-MEDROL) injection  80 mg Intravenous Q8H  . mycophenolate  720 mg Oral BID  . nadolol  80 mg Oral BID  . omega-3 acid ethyl esters  1 g Oral Daily  . potassium chloride  20 mEq Oral BID  . sodium chloride flush  3 mL Intravenous Q12H  . spironolactone  25 mg Oral Daily   Continuous Infusions: . furosemide 120 mg (09/01/20 1823)   PRN Meds: ALPRAZolam, hyoscyamine, ipratropium-albuterol, ondansetron **OR** ondansetron (ZOFRAN) IV   Vital Signs    Vitals:   09/02/20 0000 09/02/20 0534 09/02/20 0550 09/02/20 0814  BP: (!) 136/53  (!) 145/61 (!) 154/76  Pulse:  (!) 54 (!) 54 (!) 57  Resp: _0 Temp:   97.9 F (36.6 C) 99.1 F (37.3 C)  TempSrc:   Axillary Oral  SpO2: 95% 96% 97% 94%  Weight:      Height:        Intake/Output Summary (Last 24 hours) at 09/02/2020 0949 Last data filed at 09/02/2020 0818 Gross per 24 hour  Intake 542 ml  Output 325 ml  Net 217 ml   Last 3 Weights 08/29/2020 08/28/2020 06/17/2020  Weight (lbs) 330 lb 0.5 oz 330 lb 326 lb 14.4 oz  Weight (kg) 149.7 kg 149.687 kg 148.281 kg      Telemetry    Normal sinus rhythm- Personally Reviewed  ECG    No new tracing  Physical Exam   GEN: Obese female in no acute distress Neck:  No JVD Cardiac: RRR, no murmurs, rubs, or gallops.  Trace edema Respiratory:  Diminished breath sound  GI: Soft, nontender, non-distended  MS: No edema; No deformity. Neuro:  Nonfocal  Psych: Normal affect   Labs    High Sensitivity Troponin:   Recent Labs  Lab 08/28/20 0155 08/28/20 0647 08/28/20 0813  TROPONINIHS 16 18* 15      Chemistry Recent Labs  Lab 08/28/20 0155 08/28/20 0611 08/31/20 0230 08/31/20 0230 09/01/20 0808 09/01/20 1645 09/01/20 1646 09/01/20 1650 09/02/20 0327  NA 136   < > 134*   < > 130*   < > 140 142 134*  K 3.1*   < > 3.5   < > 4.0   < > 3.5 3.3* 3.5  CL 101   < > 97*  --  95*  --   --   --  94*  CO2 26   < > 30  --  27  --   --   --  29  GLUCOSE 60*   < > 177*  --  325*  --   --   --  145*  BUN 15   < > 22  --  22  --   --   --  35*  CREATININE 0.93   < > 0.83  --  0.85  --   --   --  0.95  CALCIUM 8.5*   < > 7.7*  --  7.9*  --   --   --  8.4*  PROT 10.9*  --   --   --   --   --   --   --   --   ALBUMIN 3.2*  --   --   --   --   --   --   --   --   AST 111*  --   --   --   --   --   --   --   --   ALT 186*  --   --   --   --   --   --   --   --   ALKPHOS 41  --   --   --   --   --   --   --   --   BILITOT 0.9  --   --   --   --   --   --   --   --   GFRNONAA >60   < > >60  --  >60  --   --   --  >60  GFRAA >60   < > >60  --  >60  --   --   --  >60  ANIONGAP 9   < > 7  --  8  --   --   --  11   < > = values in this interval not displayed.     Hematology Recent Labs  Lab 08/31/20 0230 08/31/20 0230 09/01/20 0808 09/01/20 1645 09/01/20 1646 09/01/20 1650 09/02/20 0327  WBC 9.3  --  9.9  --   --   --  15.6*  RBC 3.32*  --  3.60*  --   --   --  3.64*  HGB 10.3*   < > 11.5*   < > 13.3 12.9 11.7*  HCT 33.1*   < > 36.1   < > 39.0 38.0 35.6*  MCV 99.7  --  100.3*  --   --   --  97.8  MCH 31.0  --  31.9  --   --   --  32.1  MCHC 31.1  --  31.9  --   --   --  32.9  RDW 14.7  --  14.4  --   --   --  14.3  PLT 211  --  162  --   --   --   281   < > = values in this interval not displayed.    BNP Recent Labs  Lab 08/28/20 0155  BNP 167.7*     Radiology    CT ANGIO CHEST PE W OR WO CONTRAST  Result Date: 08/31/2020 CLINICAL DATA:  Increasing shortness of breath over the past 2 days. EXAM: CT ANGIOGRAPHY CHEST WITH CONTRAST TECHNIQUE: Multidetector CT imaging of the chest was performed using the standard protocol during bolus administration of intravenous contrast. Multiplanar CT image reconstructions and MIPs were obtained to evaluate the vascular anatomy. CONTRAST:  51m OMNIPAQUE IOHEXOL 350 MG/ML SOLN Patient was pre-medicated prior to the exam for contrast allergy. COMPARISON:  Radiograph yesterday.  Chest CT 06/11/2020 FINDINGS: Cardiovascular: There are no filling defects within the pulmonary arteries to suggest  pulmonary embolus. Dilated main pulmonary artery at 3.7 cm. There is multi chamber cardiomegaly. No pericardial effusion. Aortic atherosclerosis and tortuosity. No aortic aneurysm. Mediastinum/Nodes: Prominent lower paratracheal nodes measure up to 12 mm short axis. There are small bilateral hilar lymph nodes. No suspicious thyroid nodule. No esophageal wall thickening. Lungs/Pleura: There are multifocal ground-glass and patchy opacities throughout both lungs. Areas of septal thickening. Minimal pleural thickening without significant effusion. Trachea and central bronchi are grossly patent, imaging obtained in expiration limiting assessment. Upper Abdomen: Enlarged liver with nodular contours, partially included. No acute findings. Musculoskeletal: Degenerative change in the spine. There are no acute or suspicious osseous abnormalities. Review of the MIP images confirms the above findings. IMPRESSION: 1. No pulmonary embolus. 2. Multifocal ground-glass and patchy opacities throughout both lungs. There is also septal thickening. Findings may represent pulmonary edema, multifocal pneumonia, or combination there of. 3.  Cardiomegaly. Dilated main pulmonary artery, can be seen with pulmonary arterial hypertension. 4. Prominent lower paratracheal and hilar lymph nodes are likely reactive Aortic Atherosclerosis (ICD10-I70.0). Electronically Signed   By: Keith Rake M.D.   On: 08/31/2020 21:23   CARDIAC CATHETERIZATION  Result Date: 09/01/2020 Findings: RA = 15 RV = 80/18 PA = 80/35 (51) PCW = 26 Fick cardiac output/index = 7.2/2.9 PVR = 3.5 WU FA sat = 93% PA sat = 64%, 66% SVC sat = 60% Assessment: 1. 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 Moderately elevated left-sided filling pressures 3. High cardiac output with PVR 3.5 WU (given patient's body habitus this may be overestimated by Fick equation. Thermodilution not available on 5FR catheter) 4. 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 Continue diuresis and BIPAP. Will need weight loss. Glori Bickers, MD 5:00 PM    Cardiac Studies   Findings:  RA = 15 RV = 80/18 PA = 80/35 (51) PCW = 26 Fick cardiac output/index = 7.2/2.9 PVR = 3.5 WU FA sat = 93% PA sat = 64%, 66% SVC sat = 60%  Assessment: 1. 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 Moderately elevated left-sided filling pressures 3. High cardiac output with PVR 3.5 WU (given patient's body habitus this may be overestimated by Fick equation. Thermodilution not available on 5FR catheter) 4. 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 Continue diuresis and BIPAP. Will need weight loss.    Patient Profile     59 y.o. 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  Assessment & Plan    1.  Acute on chronic hypoxic respiratory failure 2.  Pulmonary hypertension  Cardiac catheterization showed moderately elevated left-sided pressure.  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  Her Lasix increased to 120 mg twice daily.  Patient reports excellent diuresis however no strict output or daily weight recorded (will place).  Echocardiogram done pending reading.  Continue current diuretic regimen. Renal function stable.  For questions or updates, please contact [place] Please consult [email or web address] for contact info under        SignedLeanor Kail, PA  09/02/2020, 9:49 AM

## 2020-09-02 NOTE — Progress Notes (Signed)
Pt wants to go on Bipap at 2200.

## 2020-09-02 NOTE — TOC Initial Note (Signed)
Transition of Care [facility]) - Initial/Assessment Note    Patient Details  Name: Monica Ellis MRN: [id number] Date of Birth: 09/18/1961  [facility] CM/SW Contact:    Joanne Chars, LCSW Phone Number: 09/02/2020, 10:38 AM  Clinical Narrative:      CSW discussed DC plan with pt who reports that [facility] had just started prior to this admission and she would like to continue with them.  Pt also reports that she will need hospital bed at this point, which she does not yet have.  Pt is not vaccinated for covid and reports she was not recommended to get the vaccine due to some of her medical issues.  Permission given to speak with daughter, mother, Hospice.    CSW spoke with Freddie Breech at [facility] and informed about the hospital bed need.  Will stay in touch to coordinate DC date.             Expected Discharge Plan: Home w Hospice Care Barriers to Discharge: Continued Medical Work up   Patient Goals and CMS Choice Patient states their goals for this hospitalization and ongoing recovery are:: "be comfortable" CMS Medicare.gov Compare Post Acute Care list provided to::  (pt has aready chosin [facility], who is in place)    Expected Discharge Plan and Services Expected Discharge Plan: [place] Acute Care Choice: Hospice Living arrangements for the past 2 months: [place] Agency:  ([place]) Date [place]: 09/02/20 Time [place]: 1030 Representative spoke with at [place]: Esther Hardy  Prior Living Arrangements/Services Living arrangements for the past 2 months: [place] with:: Parents, Friends Patient language and need for interpreter reviewed:: Yes Do you feel safe going back to the place where you live?: Yes      Need for Family Participation in Patient Care: Yes (Comment) Care giver support system in place?: Yes (comment)    Criminal Activity/Legal Involvement Pertinent to Current Situation/Hospitalization: No - Comment as needed  Activities of Daily Living      Permission Sought/Granted Permission sought to share information with : Customer service manager, Family Supports Permission granted to share information with : Yes, Verbal Permission Granted  Share Information with NAME: Jinny Blossom, daughter  Permission granted to share info w AGENCY: hospice        Emotional Assessment Appearance:: Appears stated age Attitude/Demeanor/Rapport: Engaged Affect (typically observed): Pleasant Orientation: : Oriented to Self, Oriented to Place, Oriented to  Time, Oriented to Situation Alcohol / Substance Use: Not Applicable Psych Involvement: No (comment)  Admission diagnosis:  Acute respiratory failure with hypoxia (HCC) [J96.01] Acute on chronic respiratory failure with hypoxia (HCC) [J96.21] Pneumonia of both lower lobes due to infectious organism [J18.9] Patient Active Problem List   Diagnosis Date Noted   Nicotine dependence, cigarettes, uncomplicated 72/08/4708   Acute on chronic respiratory failure with hypoxia ([place]) 06/10/2020   Nausea vomiting and diarrhea 06/14/2019   AKI (acute kidney injury) ([place]) 06/14/2019   CAP (community acquired pneumonia) 02/13/2018   Dizziness 02/12/2018   Autoimmune hepatitis ([place]) 11/18/2017   Essential hypertension 11/18/2017   Class 3 severe obesity due to excess calories with serious comorbidity and body mass index (BMI) of 40.0 to 44.9 in adult [facility]) 11/18/2017   History of depression  11/18/2017   GERD without esophagitis 11/18/2017   Dyslipidemia 11/18/2017   Vitamin D deficiency 11/18/2017   History of migraine 11/18/2017   History of meningioma 11/18/2017   DDD (degenerative disc disease), cervical 11/18/2017   DDD (degenerative disc disease), lumbar 11/18/2017   Acute diverticulitis 06/07/2017   Hypokalemia 06/07/2017   Hypomagnesemia  06/07/2017   Tobacco use disorder 06/07/2017   Atelectasis of both lungs 06/07/2017   Acute maxillary sinusitis 10/13/2016   Abnormal LFTs    Pain in the chest    Controlled type 2 diabetes mellitus with diabetic polyneuropathy, with long-term current use of insulin ([place])    Hypoxia 12/29/2015   Uncontrolled type 2 diabetes mellitus ([place]) 12/29/2015   Dyspnea 12/29/2015   LLQ pain 12/29/2015   Lupus ([place]) 12/29/2015   Chest pain 12/29/2015   Transaminitis 12/29/2015   Chronic pain 12/29/2015   Depression 12/29/2015   PCP:  Ferd Hibbs, NP Pharmacy:   [facility], [place] - [address] [place] [address] Phone: [phone] Fax: [phone]     Social Determinants of Health (SDOH) Interventions    Readmission Risk Interventions No flowsheet data found.

## 2020-09-02 NOTE — Plan of Care (Signed)
  Problem: Education: Goal: Knowledge of General Education information will improve Description: Including pain rating scale, medication(s)/side effects and non-pharmacologic comfort measures Outcome: Progressing   Problem: Clinical Measurements: Goal: Respiratory complications will improve Outcome: Progressing   Problem: Nutrition: Goal: Adequate nutrition will be maintained Outcome: Progressing   Problem: Coping: Goal: Level of anxiety will decrease Outcome: Progressing   Problem: Elimination: Goal: Will not experience complications related to bowel motility Outcome: Progressing   Problem: Elimination: Goal: Will not experience complications related to urinary retention Outcome: Progressing   Problem: Pain Managment: Goal: General experience of comfort will improve Outcome: Progressing   Problem: Safety: Goal: Ability to remain free from injury will improve Outcome: Progressing

## 2020-09-03 LAB — GLUCOSE, CAPILLARY
Glucose-Capillary: 148 mg/dL — ABNORMAL HIGH (ref 70–99)
Glucose-Capillary: 197 mg/dL — ABNORMAL HIGH (ref 70–99)
Glucose-Capillary: 253 mg/dL — ABNORMAL HIGH (ref 70–99)
Glucose-Capillary: 390 mg/dL — ABNORMAL HIGH (ref 70–99)
Glucose-Capillary: 243 mg/dL — ABNORMAL HIGH (ref 70–99)

## 2020-09-03 LAB — CBC
HCT: 38.2 % (ref 36.0–46.0)
Hemoglobin: 12.5 g/dL (ref 12.0–15.0)
MCH: 32.2 pg (ref 26.0–34.0)
MCHC: 32.7 g/dL (ref 30.0–36.0)
MCV: 98.5 fL (ref 80.0–100.0)
Platelets: 303 10*3/uL (ref 150–400)
RBC: 3.88 MIL/uL (ref 3.87–5.11)
RDW: 14.5 % (ref 11.5–15.5)
WBC: 15.3 10*3/uL — ABNORMAL HIGH (ref 4.0–10.5)
nRBC: 0 % (ref 0.0–0.2)

## 2020-09-03 LAB — BASIC METABOLIC PANEL
Anion gap: 9 (ref 5–15)
BUN: 36 mg/dL — ABNORMAL HIGH (ref 8–23)
CO2: 29 mmol/L (ref 22–32)
Calcium: 8.6 mg/dL — ABNORMAL LOW (ref 8.9–10.3)
Chloride: 96 mmol/L — ABNORMAL LOW (ref 98–111)
Creatinine, Ser: 0.93 mg/dL (ref 0.44–1.00)
GFR calc Af Amer: >60 mL/min (ref >60)
GFR calc non Af Amer: >60 mL/min (ref >60)
Glucose, Bld: 130 mg/dL — ABNORMAL HIGH (ref 70–99)
Potassium: 3.8 mmol/L (ref 3.5–5.1)
Sodium: 134 mmol/L — ABNORMAL LOW (ref 135–145)

## 2020-09-03 LAB — PHOSPHORUS: Phosphorus: 3 mg/dL (ref 2.5–4.6)

## 2020-09-03 LAB — MAGNESIUM: Magnesium: 2.1 mg/dL (ref 1.7–2.4)

## 2020-09-03 MED ORDER — INSULIN ASPART 100 UNIT/ML ~~LOC~~ SOLN
4.0000 [IU] | Freq: Once | SUBCUTANEOUS | Status: AC
  Administered 2020-09-03: 4 [IU] via SUBCUTANEOUS

## 2020-09-03 MED ORDER — INSULIN ASPART PROT & ASPART (70-30 MIX) 100 UNIT/ML ~~LOC~~ SUSP
100.0000 [IU] | Freq: Three times a day (TID) | SUBCUTANEOUS | Status: DC
  Administered 2020-09-03 – 2020-09-05 (×7): 100 [IU] via SUBCUTANEOUS
  Filled 2020-09-03 (×2): qty 10

## 2020-09-03 MED ORDER — ZOLPIDEM TARTRATE 5 MG PO TABS
5.0000 mg | ORAL_TABLET | Freq: Once | ORAL | Status: AC
  Administered 2020-09-03: 5 mg via ORAL
  Filled 2020-09-03: qty 1

## 2020-09-03 MED ORDER — SODIUM CHLORIDE 0.9% FLUSH: 10.0000 mL | INTRAVENOUS | Status: DC | PRN

## 2020-09-03 NOTE — Evaluation (Signed)
Physical Therapy Evaluation Patient Details Name: Monica Ellis MRN: [id number] DOB: 06-Aug-1961 Today's Date: 09/03/2020   History of Present Illness  59 y.o. 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  Pt is deconditioned with significant ?lupus based lung disease.  Presently pt is at supervision to mod I level of mobility, but is limited in amount of mobility..  Pt currently limited functionally due to the problems listed below.  (see problems list.)  Pt will benefit from PT to maximize function and safety to be able to get home safely with available assist.     Follow Up Recommendations Home health PT;Other (comment) (to work toward pulmonary rehab vs no f/u)    Equipment Recommendations  None recommended by PT    Recommendations for Other Services       Precautions / Restrictions        Mobility  Bed Mobility Overal bed mobility: Modified Independent                Transfers Overall transfer level: Modified independent               General transfer comment: up without use of hands  Ambulation/Gait Ambulation/Gait assistance: Supervision Gait Distance (Feet): 15 Feet (then additional 30 and 10 back to bed.  Wll with rest in b/w) Assistive device: Rolling walker (2 wheeled) Gait Pattern/deviations: Step-through pattern Gait velocity: slower Gait velocity interpretation: <1.8 ft/sec, indicate of risk for recurrent falls General Gait Details: slow and steady with the RW, sats on 4L maintained in low 90's and rose to higher 90's at rest.  Stairs            Wheelchair  Mobility    Modified Rankin (Stroke Patients Only)       Balance Overall balance assessment: No apparent balance deficits (not formally assessed)                                           Pertinent Vitals/Pain Pain Assessment: Faces Faces Pain Scale: No hurt Pain Intervention(s): Monitored during session    Home Living Family/patient expects to be discharged to:: Private residence Living Arrangements: Parent Available Help at Discharge: Family;Available PRN/intermittently Type of Home: House Home Access: Stairs to enter Entrance Stairs-Rails: Left Entrance Stairs-Number of Steps: 5 Home Layout: One level Home Equipment: Walker - 4 wheels;Cane - single point;Shower seat;Grab bars - tub/shower      Prior Function Level of Independence: Independent with assistive device(s);Needs assistance   Gait / Transfers Assistance Needed: Uses rollator  ADL's / Homemaking Assistance Needed: Independent with ADLs. Trouble showering due to SOB.        Hand Dominance   Dominant Hand: Right    Extremity/Trunk Assessment   Upper Extremity Assessment Upper Extremity Assessment: Overall WFL for tasks assessed    Lower Extremity Assessment Lower Extremity Assessment: Overall WFL for tasks assessed (deconditioning, not overt weakness)       Communication   Communication: No difficulties  Cognition Arousal/Alertness: Awake/alert Behavior During Therapy: WFL for tasks assessed/performed Overall Cognitive Status: Within Functional Limits for tasks assessed  General Comments      Exercises     Assessment/Plan    PT Assessment Patient needs continued PT services  PT Problem List Decreased activity tolerance;Decreased balance;Cardiopulmonary status limiting activity       PT Treatment Interventions Gait training;Functional mobility training;Therapeutic activities;Patient/family education;DME  instruction;Stair training    PT Goals (Current goals can be found in the Care Plan section)  Acute Rehab PT Goals Patient Stated Goal: progressively get my breath back PT Goal Formulation: With patient Time For Goal Achievement: 09/10/20 Potential to Achieve Goals: Good    Frequency Min 3X/week   Barriers to discharge        Co-evaluation               AM-PAC PT "6 Clicks" Mobility  Outcome Measure Help needed turning from your back to your side while in a flat bed without using bedrails?: None Help needed moving from lying on your back to sitting on the side of a flat bed without using bedrails?: None Help needed moving to and from a bed to a chair (including a wheelchair)?: None Help needed standing up from a chair using your arms (e.g., wheelchair or bedside chair)?: None Help needed to walk in hospital room?: None Help needed climbing 3-5 steps with a railing? : A Little 6 Click Score: 23    End of Session Equipment Utilized During Treatment: Oxygen Activity Tolerance: Patient tolerated treatment well;Patient limited by fatigue;Other (comment) (out of breath) Patient left: in bed;with call bell/phone within reach;with nursing/sitter in room Nurse Communication: Mobility status PT Visit Diagnosis: Other abnormalities of gait and mobility (R26.89);Difficulty in walking, not elsewhere classified (R26.2)    Time: 1610-9604 PT Time Calculation (min) (ACUTE ONLY): 34 min   Charges:   PT Evaluation $PT Eval Moderate Complexity: 1 Mod PT Treatments $Gait Training: 8-22 mins        09/03/2020  Ginger Carne., PT Acute Rehabilitation Services [phone]  (pager) [phone]  (office)  Tessie Fass Alisa Stjames 09/03/2020, 1:28 PM

## 2020-09-03 NOTE — Progress Notes (Signed)
PROGRESS NOTE  Monica Ellis  DOB: 1961/09/16  PCP: Ferd Hibbs, NP NIO:[id number]  DOA: 08/28/2020  LOS: 6 days   Chief Complaint  Patient presents with  . Shortness of Breath   Brief narrative: Monica Ellis is a 59 y.o. female with PMH of morbid obesity, OHS/OSA, chronic smoking, COPD on 2 L O2, DM 2, autoimmune hepatitis/lupus, liver cirrhosis Patient presented to the ED on 08/28/2020 with 2-day history of worsening shortness of breath associated with chest pressure, cough.  Reports weight gain of about 100 pounds in last few months.  She was hospitalized 2 months ago for similar symptoms and improved with diuresis.  In the ED, she was hypoxic and required nonrebreather mask.  Chest x-ray showed bilateral infiltrates. Troponin negative, BNP was 167 Covid PCR negative. Blood gas with pH 7.3, PCO2 62 CT angio chest negative for pulmonary embolism. Patient was admitted to hospitalist service for further evaluation management  Subjective: Patient was seen and examined this morning. Middle-aged female.  Not in respiratory distress.  On 4 L/min cannula. Chart reviewed. No fever, bradycardic to 50s in the night, blood pressure mostly 130s, on BiPAP overnight Was requiring 6 L by nasal cannula yesterday.  4 L this morning. Labs this morning with sodium 134, renal function normal, WBC count 15.3  Assessment/Plan: Acute on chronic respiratory failure with hypoxia and hypercarbia -Primarily due to acute CHF exacerbation.  Also contributed by morbid obesity, OHS and OSA -Chronically on 2 L oxygen at home. Currently requiring higher flow.  Acute exacerbation of diastolic CHF and right-sided heart failure Moderate to severe pulmonary hypertension -Presented with dyspnea, lower extremity edema, bibasilar rales -BNP 167. -Cardiology consult appreciated. -Echocardiogram 9/28 with a EF 55 to 81%, grade 2 diastolic dysfunction -Right heart cath 9/27 showed moderate to  severe pulmonary hypertension -Currently on Lasix 120 mg IV twice daily.  -Also on nadolol and Aldactone because of coexisting cirrhosis. -Continue to monitor intake output, daily weight, blood pressure, renal function and electrolytes.  Hypokalemia/hypomagnesemia/hypophosphatemia -Levels being monitored while on aggressive diuresis.  Replace as needed. Recent Labs  Lab 09/01/20 0808 09/01/20 0808 09/01/20 1645 09/01/20 1646 09/01/20 1650 09/02/20 0327 09/03/20 0534  K 4.0   < > 3.5 3.5 3.3* 3.5 3.8  MG 1.8  --   --   --   --   --  2.1  PHOS 1.9*  --   --   --   --  2.0* 3.0   < > = values in this interval not displayed.   Diabetes mellitus type 2 -A1c 6.2 on 9/23. -Currently on NovoLog 70/30, 90 units 3 times daily  -I will increase it to 100 units 3 times daily. -Continue sliding scale insulin with Accu-Cheks. Recent Labs  Lab 09/02/20 1216 09/02/20 1651 09/02/20 2008 09/03/20 0725 09/03/20 1143  GLUCAP 321* 160* 214* 148* 197*   History of lupus Autoimmune hepatitis  -On mycophenolate and prednisone for autoimmune hepatitis..   Liver cirrhosis -Probably related to autoimmune hepatitis, right-sided heart failure -Continue home medications nadolol, spironolactone, lactulose Recent Labs  Lab 08/28/20 0155  AST 111*  ALT 186*  ALKPHOS 41  BILITOT 0.9  PROT 10.9*  ALBUMIN 3.2*   Morbid obesity - Body mass index is 55.14 kg/m. Patient has been advised to make an attempt to improve diet and exercise patterns to aid in weight loss.  OHS/OSA -Patient admits to morning headaches, somnolence at baseline and feeling poorly rested consistent with obstructive sleep apnea. -Nightly BiPAP in the hospital. -Recommend  close monitoring with PCP and pulmonology for sleep study as soon as can be scheduled outpatient.  COPD, not in acute exacerbation  -Smokes 1 pack/day.  Claims she does not have COPD/emphysema.   -Continue nebulizer and steroids.  Mobility: Encourage  ambulation.  PT eval Code Status:   Code Status: DNR  Nutritional status: Body mass index is 55.14 kg/m.     Diet Order            Diet heart healthy/carb modified Room service appropriate? Yes; Fluid consistency: Thin  Diet effective now                 DVT prophylaxis:  Antimicrobials:  None Fluid: None Consultants: Cardiology Family Communication:  None at bedside  Status is: Inpatient  Remains inpatient appropriate because:Ongoing diagnostic testing needed not appropriate for outpatient work up and IV treatments appropriate due to intensity of illness or inability to take PO   Dispo: The patient is from: Home              Anticipated d/c is to: Home              Anticipated d/c date is: 2 days              Patient currently is not medically stable to d/c.    Infusions:  . furosemide 120 mg (09/03/20 1004)    Scheduled Meds: . amLODipine  10 mg Oral Daily  . buPROPion  150 mg Oral Daily  . DULoxetine  60 mg Oral QHS  . enoxaparin (LOVENOX) injection  75 mg Subcutaneous Q24H  . insulin aspart  0-15 Units Subcutaneous TID WC  . insulin aspart  0-5 Units Subcutaneous QHS  . insulin aspart protamine- aspart  100 Units Subcutaneous TID AC  . lactulose  20 g Oral TID  . magnesium oxide  200 mg Oral QHS  . methylPREDNISolone (SOLU-MEDROL) injection  80 mg Intravenous Q8H  . mycophenolate  720 mg Oral BID  . nadolol  80 mg Oral BID  . omega-3 acid ethyl esters  1 g Oral Daily  . potassium chloride  40 mEq Oral BID  . sodium chloride flush  3 mL Intravenous Q12H  . spironolactone  25 mg Oral Daily    Antimicrobials: Anti-infectives (From admission, onward)   Start     Dose/Rate Route Frequency Ordered Stop   08/28/20 1700  vancomycin (VANCOREADY) IVPB 1250 mg/250 mL  Status:  Discontinued        1,250 mg 166.7 mL/hr over 90 Minutes Intravenous Every 12 hours 08/28/20 0352 08/28/20 0545   08/28/20 1000  cefTRIAXone (ROCEPHIN) 2 g in sodium chloride 0.9 % 100 mL  IVPB  Status:  Discontinued        2 g 200 mL/hr over 30 Minutes Intravenous Every 24 hours 08/28/20 0547 08/28/20 1550   08/28/20 1000  azithromycin (ZITHROMAX) 500 mg in sodium chloride 0.9 % 250 mL IVPB  Status:  Discontinued        500 mg 250 mL/hr over 60 Minutes Intravenous Every 24 hours 08/28/20 0547 08/28/20 1550   08/28/20 0400  ceFEPIme (MAXIPIME) 2 g in sodium chloride 0.9 % 100 mL IVPB  Status:  Discontinued        2 g 200 mL/hr over 30 Minutes Intravenous Every 8 hours 08/28/20 0345 08/28/20 0545   08/28/20 0400  vancomycin (VANCOCIN) 2,500 mg in sodium chloride 0.9 % 500 mL IVPB        2,500 mg 250  mL/hr over 120 Minutes Intravenous  Once 08/28/20 0345 08/28/20 0634      PRN meds: ALPRAZolam, hyoscyamine, ipratropium-albuterol, ondansetron **OR** ondansetron (ZOFRAN) IV, sodium chloride flush   Objective: Vitals:   09/03/20 1100 09/03/20 1143  BP:  (!) 141/69  Pulse:  60  Resp: (!) 23 (!) 21  Temp:  98.6 F (37 C)  SpO2: 94% 98%    Intake/Output Summary (Last 24 hours) at 09/03/2020 1315 Last data filed at 09/03/2020 1011 Gross per 24 hour  Intake 324.15 ml  Output 1375 ml  Net -1050.85 ml   Filed Weights   08/29/20 0500 09/02/20 2347 09/03/20 0132  Weight: (!) 149.7 kg (!) 150.3 kg (!) 150.3 kg   Weight change:  Body mass index is 55.14 kg/m.   Physical Exam: General exam: Appears calm and comfortable.  Not in physical distress Skin: No rashes, lesions or ulcers. HEENT: Atraumatic, normocephalic, supple neck, no obvious bleeding Lungs: Clear to auscultation bilaterally CVS: Regular rate and rhythm, no murmur GI/Abd soft, distended from obesity, nontender, bowel sound present CNS: Alert, awake, oriented x3 Psychiatry: Mood appropriate Extremities: Trace bilateral pedal edema, no calf tenderness  Data Review: I have personally reviewed the laboratory data and studies available.  Recent Labs  Lab 08/28/20 0155 08/28/20 0611 08/30/20 0457  08/30/20 0457 08/31/20 0230 08/31/20 0230 09/01/20 0808 09/01/20 0808 09/01/20 1645 09/01/20 1646 09/01/20 1650 09/02/20 0327 09/03/20 0534  WBC 11.5*   < > 10.4  --  9.3  --  9.9  --   --   --   --  15.6* 15.3*  NEUTROABS 8.0*  --   --   --   --   --   --   --   --   --   --   --   --   HGB 12.2   < > 10.7*   < > 10.3*   < > 11.5*   < > 13.6 13.3 12.9 11.7* 12.5  HCT 38.8   < > 33.8*   < > 33.1*   < > 36.1   < > 40.0 39.0 38.0 35.6* 38.2  MCV 103.2*   < > 102.4*  --  99.7  --  100.3*  --   --   --   --  97.8 98.5  PLT 226   < > 196  --  211  --  162  --   --   --   --  281 303   < > = values in this interval not displayed.   Recent Labs  Lab 08/30/20 0457 08/30/20 0457 08/31/20 0230 08/31/20 0230 09/01/20 0808 09/01/20 0808 09/01/20 1645 09/01/20 1646 09/01/20 1650 09/02/20 0327 09/03/20 0534  NA 134*   < > 134*   < > 130*   < > 141 140 142 134* 134*  K 3.4*   < > 3.5   < > 4.0   < > 3.5 3.5 3.3* 3.5 3.8  CL 97*  --  97*  --  95*  --   --   --   --  94* 96*  CO2 28  --  30  --  27  --   --   --   --  29 29  GLUCOSE 124*  --  177*  --  325*  --   --   --   --  145* 130*  BUN 28*  --  22  --  22  --   --   --   --  35* 36*  CREATININE 0.87  --  0.83  --  0.85  --   --   --   --  0.95 0.93  CALCIUM 7.8*  --  7.7*  --  7.9*  --   --   --   --  8.4* 8.6*  MG  --   --   --   --  1.8  --   --   --   --   --  2.1  PHOS  --   --   --   --  1.9*  --   --   --   --  2.0* 3.0   < > = values in this interval not displayed.    F/u labs ordered.  Signed, Terrilee Croak, MD [facility] 09/03/2020

## 2020-09-03 NOTE — Progress Notes (Signed)
Progress Note  Patient Name: Monica Ellis Date of Encounter: 09/03/2020  [facility] Cardiologist: Evalina Field, MD   Subjective   Breathing improving, now on 4L of oxygen.   Inpatient Medications    Scheduled Meds: . amLODipine  10 mg Oral Daily  . buPROPion  150 mg Oral Daily  . DULoxetine  60 mg Oral QHS  . enoxaparin (LOVENOX) injection  75 mg Subcutaneous Q24H  . insulin aspart  0-15 Units Subcutaneous TID WC  . insulin aspart  0-5 Units Subcutaneous QHS  . insulin aspart protamine- aspart  100 Units Subcutaneous TID AC  . lactulose  20 g Oral TID  . magnesium oxide  200 mg Oral QHS  . methylPREDNISolone (SOLU-MEDROL) injection  80 mg Intravenous Q8H  . mycophenolate  720 mg Oral BID  . nadolol  80 mg Oral BID  . omega-3 acid ethyl esters  1 g Oral Daily  . potassium chloride  40 mEq Oral BID  . sodium chloride flush  3 mL Intravenous Q12H  . spironolactone  25 mg Oral Daily   Continuous Infusions: . furosemide 120 mg (09/02/20 1714)   PRN Meds: ALPRAZolam, hyoscyamine, ipratropium-albuterol, ondansetron **OR** ondansetron (ZOFRAN) IV, sodium chloride flush   Vital Signs    Vitals:   09/03/20 0132 09/03/20 0351 09/03/20 0425 09/03/20 0727  BP:  (!) 130/58 (!) 130/58 135/66  Pulse:  (!) 53 (!) 55 60  Resp:  _0 Temp:  98.4 F (36.9 C)  98.7 F (37.1 C)  TempSrc:  Axillary  Oral  SpO2:  95% 98% 92%  Weight: (!) 150.3 kg     Height:        Intake/Output Summary (Last 24 hours) at 09/03/2020 0904 Last data filed at 09/03/2020 0649 Gross per 24 hour  Intake 690 ml  Output 1900 ml  Net -1210 ml   Last 3 Weights 09/03/2020 09/02/2020 08/29/2020  Weight (lbs) 331 lb 5.6 oz 331 lb 5.6 oz 330 lb 0.5 oz  Weight (kg) 150.3 kg 150.3 kg 149.7 kg      Telemetry    Sinus rhythm/bradycardia  - Personally Reviewed  ECG    N/A  Physical Exam   GEN: No acute distress.   Neck: No JVD Cardiac: RRR, no murmurs, rubs, or gallops.   Respiratory: Clear to auscultation bilaterally. GI: Soft, nontender, non-distended  MS: No edema; No deformity. Neuro:  Nonfocal  Psych: Normal affect   Labs    High Sensitivity Troponin:   Recent Labs  Lab 08/28/20 0155 08/28/20 0647 08/28/20 0813  TROPONINIHS 16 18* 15      Chemistry Recent Labs  Lab 08/28/20 0155 08/28/20 0611 09/01/20 0808 09/01/20 1645 09/01/20 1650 09/02/20 0327 09/03/20 0534  NA 136   < > 130*   < > 142 134* 134*  K 3.1*   < > 4.0   < > 3.3* 3.5 3.8  CL 101   < > 95*  --   --  94* 96*  CO2 26   < > 27  --   --  29 29  GLUCOSE 60*   < > 325*  --   --  145* 130*  BUN 15   < > 22  --   --  35* 36*  CREATININE 0.93   < > 0.85  --   --  0.95 0.93  CALCIUM 8.5*   < > 7.9*  --   --  8.4* 8.6*  PROT 10.9*  --   --   --   --   --   --  ALBUMIN 3.2*  --   --   --   --   --   --   AST 111*  --   --   --   --   --   --   ALT 186*  --   --   --   --   --   --   ALKPHOS 41  --   --   --   --   --   --   BILITOT 0.9  --   --   --   --   --   --   GFRNONAA >60   < > >60  --   --  >60 >60  GFRAA >60   < > >60  --   --  >60 >60  ANIONGAP 9   < > 8  --   --  11 9   < > = values in this interval not displayed.     Hematology Recent Labs  Lab 09/01/20 0808 09/01/20 1645 09/01/20 1650 09/02/20 0327 09/03/20 0534  WBC 9.9  --   --  15.6* 15.3*  RBC 3.60*  --   --  3.64* 3.88  HGB 11.5*   < > 12.9 11.7* 12.5  HCT 36.1   < > 38.0 35.6* 38.2  MCV 100.3*  --   --  97.8 98.5  MCH 31.9  --   --  32.1 32.2  MCHC 31.9  --   --  32.9 32.7  RDW 14.4  --   --  14.3 14.5  PLT 162  --   --  281 303   < > = values in this interval not displayed.    BNP Recent Labs  Lab 08/28/20 0155  BNP 167.7*      Radiology    CARDIAC CATHETERIZATION  Result Date: 09/01/2020 Findings: RA = 15 RV = 80/18 PA = 80/35 (51) PCW = 26 Fick cardiac output/index = 7.2/2.9 PVR = 3.5 WU FA sat = 93% PA sat = 64%, 66% SVC sat = 60% Assessment: 1. Moderate to severe mixed  pulmonary HTN 2. Moderately elevated left-sided filling pressures 3. High cardiac output with PVR 3.5 WU (given patient's body habitus this may be overestimated by Fick equation. Thermodilution not available on 5FR catheter) 4. 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 Continue diuresis and BIPAP. Will need weight loss. Glori Bickers, MD 5:00 PM   ECHOCARDIOGRAM COMPLETE  Result Date: 09/02/2020    ECHOCARDIOGRAM REPORT   Patient Name:   Monica Ellis Date of Exam: 09/02/2020 Medical Rec #:  [id number]            Height:       65.0 in Accession #:    [id number]           Weight:       330.0 lb Date of Birth:  07-12-1961            BSA:          2.447 m Patient Age:    59 years             BP:           154/76 mmHg Patient Gender: F                    HR:           57 bpm. Exam Location:  Inpatient Procedure: 2D Echo Indications:  hypoxia  History:        Patient has prior history of Echocardiogram examinations, most                 recent 06/11/2020. COPD; Risk Factors:Diabetes. Tobacco use                 disorder.  Sonographer:    Jannett Celestine RDCS (AE) Referring Phys: 45 [place]  Sonographer Comments: Technically difficult study due to poor echo windows. IMPRESSIONS  1. Limited images- technically difficult, Definity not given  2. LV E' velocity is low at 5-6 cm/sec. Left ventricular ejection fraction, by estimation, is 55 to 60%. The left ventricle has normal function. The left ventricle has no regional wall motion abnormalities. There is moderate left ventricular hypertrophy. Left ventricular diastolic parameters are consistent with Grade II diastolic dysfunction (pseudonormalization). Elevated left ventricular end-diastolic pressure. The E/e' is 21.  3. Right ventricular systolic function is low normal. The right ventricular size is mildly enlarged. Tricuspid regurgitation signal is inadequate for assessing PA pressure.  4. The mitral  valve was not well visualized. No evidence of mitral valve regurgitation.  5. The aortic valve was not well visualized. Aortic valve regurgitation is not visualized.  6. The inferior vena cava is normal in size with <50% respiratory variability, suggesting right atrial pressure of 8 mmHg. Comparison(s): Changes from prior study are noted. 06/11/2020: Limited windows, LVEF 60-65%, moderate LVH, ? normal diastolic function. Conclusion(s)/Recommendation(s): Findings suggest volume overload may be contributing to hypoxia - echo windows were poor, consider right heart cath if volume assessment is challenging. FINDINGS  Left Ventricle: LV E' velocity is low at 5-6 cm/sec. Left ventricular ejection fraction, by estimation, is 55 to 60%. The left ventricle has normal function. The left ventricle has no regional wall motion abnormalities. The left ventricular internal cavity size was normal in size. There is moderate left ventricular hypertrophy. Left ventricular diastolic parameters are consistent with Grade II diastolic dysfunction (pseudonormalization). Elevated left ventricular end-diastolic pressure. The E/e' is 21. Right Ventricle: The right ventricular size is mildly enlarged. No increase in right ventricular wall thickness. Right ventricular systolic function is low normal. Tricuspid regurgitation signal is inadequate for assessing PA pressure. Left Atrium: Left atrial size was normal in size. Right Atrium: Right atrial size was normal in size. Pericardium: There is no evidence of pericardial effusion. Mitral Valve: The mitral valve was not well visualized. No evidence of mitral valve regurgitation. Tricuspid Valve: The tricuspid valve is not well visualized. Tricuspid valve regurgitation is not demonstrated. Aortic Valve: The aortic valve was not well visualized. Aortic valve regurgitation is not visualized. Pulmonic Valve: The pulmonic valve was grossly normal. Pulmonic valve regurgitation is trivial. Aorta: Aortic  root could not be assessed. Venous: The inferior vena cava is normal in size with less than 50% respiratory variability, suggesting right atrial pressure of 8 mmHg. IAS/Shunts: The interatrial septum was not assessed.  LEFT VENTRICLE PLAX 2D LVIDd:         4.10 cm  Diastology LVIDs:         2.90 cm  LV e' medial:    5.77 cm/s LV PW:         1.40 cm  LV E/e' medial:  24.6 LV IVS:        1.40 cm  LV e' lateral:   8.27 cm/s LVOT diam:     2.10 cm  LV E/e' lateral: 17.2 LV SV:  105 LV SV Index:   43 LVOT Area:     3.46 cm  RIGHT VENTRICLE RV S prime:     12.50 cm/s LEFT ATRIUM             Index LA diam:        4.70 cm 1.92 cm/m LA Vol (A2C):   30.5 ml 12.46 ml/m LA Vol (A4C):   39.0 ml 15.94 ml/m LA Biplane Vol: 38.0 ml 15.53 ml/m  AORTIC VALVE LVOT Vmax:   130.00 cm/s LVOT Vmean:  94.400 cm/s LVOT VTI:    0.304 m  AORTA Ao Root diam: 3.00 cm MITRAL VALVE MV Area (PHT): 2.80 cm     SHUNTS MV Decel Time: 271 msec     Systemic VTI:  0.30 m MV E velocity: 142.00 cm/s  Systemic Diam: 2.10 cm MV A velocity: 72.20 cm/s MV E/A ratio:  1.97 Lyman Bishop MD Electronically signed by Lyman Bishop MD Signature Date/Time: 09/02/2020/10:21:54 AM    Final     Cardiac Studies   Findings:  RA = 15 RV = 80/18 PA = 80/35 (51) PCW = 26 Fick cardiac output/index = 7.2/2.9 PVR = 3.5 WU FA sat = 93% PA sat = 64%, 66% SVC sat = 60%  Assessment: 1. 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 Moderately elevated left-sided filling pressures 3. High cardiac output with PVR 3.5 WU (given patient's body habitus this may be overestimated by Fick equation. Thermodilution not available on 5FR catheter) 4. 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 Continue diuresis and BIPAP. Will need weight loss.   Echo 09/02/20 1. Limited images- technically difficult, Definity not given  2. LV E' velocity is low at 5-6 cm/sec. Left ventricular ejection  fraction,  by estimation, is 55 to 60%. The left ventricle has normal  function. The left ventricle has no regional wall motion abnormalities.  There is moderate left ventricular  hypertrophy. Left ventricular diastolic parameters are consistent with  Grade II diastolic dysfunction (pseudonormalization). Elevated left  ventricular end-diastolic pressure. The E/e' is 21.  3. Right ventricular systolic function is low normal. The right  ventricular size is mildly enlarged. Tricuspid regurgitation signal is  inadequate for assessing PA pressure.  4. The mitral valve was not well visualized. No evidence of mitral valve  regurgitation.  5. The aortic valve was not well visualized. Aortic valve regurgitation  is not visualized.  6. The inferior vena cava is normal in size with <50% respiratory  variability, suggesting right atrial pressure of 8 mmHg.   Patient Profile     59 y.o. female with a hx of autoimmune hepatitis/lupus on mycophenolate/prednisone, morbid obesity, DM type 2, tobacco abuse,suspectedobesity hypoventilation syndromewith recommended use of Trilogy ventilator at night,longstanding tobacco use (40 years),priorbrain tumor, GERD presented for shortness of breath and admitted for acute on chronic hypoxic respiratory failure.  Assessment & Plan    1.  Acute on chronic hypoxic respiratory failure 2.  Moderate to severe pulmonary hypertension 3. Chronic diastolic CHF 4. OSA and OHS  RHC as above. Echo was difficult study showed LVEF of 55-60%, grade II DD, low normal RV function. Symptoms seems multifactorial. She needs to loose weight. 1.5 L of diuresis recorded yesterday. Weight same. Continue diuresis.     For questions or updates, please contact [place] Please consult [email or web address] for contact info under        SignedLeanor Kail, PA  09/03/2020, 9:04 AM

## 2020-09-03 NOTE — Plan of Care (Signed)
  Problem: Education: Goal: Knowledge of General Education information will improve Description: Including pain rating scale, medication(s)/side effects and non-pharmacologic comfort measures Outcome: Progressing   Problem: Health Behavior/Discharge Planning: Goal: Ability to manage health-related needs will improve Outcome: Progressing   Problem: Clinical Measurements: Goal: Respiratory complications will improve Outcome: Progressing   Problem: Activity: Goal: Risk for activity intolerance will decrease Outcome: Progressing   Problem: Coping: Goal: Level of anxiety will decrease Outcome: Progressing   Problem: Clinical Measurements: Goal: Cardiovascular complication will be avoided Outcome: Not Progressing DBP in th 40-50s. HR in the 50s

## 2020-09-04 LAB — HEPATIC FUNCTION PANEL
ALT: 92 U/L — ABNORMAL HIGH (ref 0–44)
AST: 43 U/L — ABNORMAL HIGH (ref 15–41)
Albumin: 3 g/dL — ABNORMAL LOW (ref 3.5–5.0)
Alkaline Phosphatase: 38 U/L (ref 38–126)
Bilirubin, Direct: 0.1 mg/dL (ref 0.0–0.2)
Indirect Bilirubin: 1 mg/dL — ABNORMAL HIGH (ref 0.3–0.9)
Total Bilirubin: 1.1 mg/dL (ref 0.3–1.2)
Total Protein: 9.7 g/dL — ABNORMAL HIGH (ref 6.5–8.1)

## 2020-09-04 LAB — PHOSPHORUS: Phosphorus: 3.8 mg/dL (ref 2.5–4.6)

## 2020-09-04 LAB — GLUCOSE, CAPILLARY
Glucose-Capillary: 165 mg/dL — ABNORMAL HIGH (ref 70–99)
Glucose-Capillary: 168 mg/dL — ABNORMAL HIGH (ref 70–99)
Glucose-Capillary: 199 mg/dL — ABNORMAL HIGH (ref 70–99)
Glucose-Capillary: 214 mg/dL — ABNORMAL HIGH (ref 70–99)
Glucose-Capillary: 237 mg/dL — ABNORMAL HIGH (ref 70–99)

## 2020-09-04 LAB — CREATININE, SERUM
Creatinine, Ser: 1.13 mg/dL — ABNORMAL HIGH (ref 0.44–1.00)
GFR calc Af Amer: >60 mL/min (ref >60)
GFR calc non Af Amer: 52 mL/min — ABNORMAL LOW (ref >60)

## 2020-09-04 MED ORDER — PREDNISOLONE 5 MG PO TABS
12.5000 mg | ORAL_TABLET | Freq: Every day | ORAL | Status: DC
  Administered 2020-09-05: 12.5 mg via ORAL
  Filled 2020-09-04 (×2): qty 3

## 2020-09-04 MED ORDER — TORSEMIDE 20 MG PO TABS
40.0000 mg | ORAL_TABLET | Freq: Every day | ORAL | Status: DC
  Administered 2020-09-05: 40 mg via ORAL
  Filled 2020-09-04: qty 2

## 2020-09-04 MED ORDER — ZOLPIDEM TARTRATE 5 MG PO TABS
5.0000 mg | ORAL_TABLET | Freq: Every evening | ORAL | Status: DC | PRN
  Administered 2020-09-04: 5 mg via ORAL
  Filled 2020-09-04: qty 1

## 2020-09-04 NOTE — Progress Notes (Signed)
PROGRESS NOTE  Monica Ellis  DOB: 11/27/1961  PCP: Ferd Hibbs, NP JQB:[id number]  DOA: 08/28/2020  LOS: 7 days   Chief Complaint  Patient presents with   Shortness of Breath   Brief narrative: Monica Ellis is a 59 y.o. female with PMH of morbid obesity, OHS/OSA, chronic smoking, COPD on 2 L O2, DM 2, autoimmune hepatitis/lupus, liver cirrhosis Patient presented to the ED on 08/28/2020 with 2-day history of worsening shortness of breath associated with chest pressure, cough.  Reports weight gain of about 100 pounds in last few months.  She was hospitalized 2 months ago for similar symptoms and improved with diuresis.  In the ED, she was hypoxic and required nonrebreather mask.  Chest x-ray showed bilateral infiltrates. Troponin negative, BNP was 167 Covid PCR negative. Blood gas with pH 7.3, PCO2 62 CT angio chest negative for pulmonary embolism. Patient was admitted to hospitalist service for further evaluation management  Subjective: Patient was seen and examined this morning. Sitting up in bed.  Not in distress.  On 3 L oxygen by nasal cannula.  Used BiPAP last night.  Assessment/Plan: Acute on chronic respiratory failure with hypoxia and hypercarbia -Primarily due to acute CHF exacerbation.  Also contributed by morbid obesity, OHS and OSA -Chronically on 2 L oxygen at home. Currently requiring higher flow.  Acute exacerbation of diastolic CHF and right-sided heart failure Moderate to severe pulmonary hypertension -Presented with dyspnea, lower extremity edema, bibasilar rales -BNP 167. -Cardiology consult appreciated. -Echocardiogram 9/28 with a EF 55 to 40%, grade 2 diastolic dysfunction -Right heart cath 9/27 showed moderate to severe pulmonary hypertension -Currently on Lasix 120 mg IV twice daily.  -Also on nadolol and Aldactone because of coexisting cirrhosis. -Continue to monitor intake output, daily weight, blood pressure, renal function and  electrolytes. -Tentative plan to discharge on oral torsemide tomorrow.  Hypokalemia/hypomagnesemia/hypophosphatemia -Levels being monitored while on aggressive diuresis.  Replace as needed. Recent Labs  Lab 09/01/20 0808 09/01/20 0808 09/01/20 1645 09/01/20 1646 09/01/20 1650 09/02/20 0327 09/03/20 0534 09/04/20 0336  K 4.0   < > 3.5 3.5 3.3* 3.5 3.8  --   MG 1.8  --   --   --   --   --  2.1  --   PHOS 1.9*  --   --   --   --  2.0* 3.0 3.8   < > = values in this interval not displayed.   Diabetes mellitus type 2 -A1c 6.2 on 9/23. -Currently on NovoLog 70/30, 100 units 3 times daily  -I will increase it to 110 units 3 times daily. -Continue sliding scale insulin with Accu-Cheks. Recent Labs  Lab 09/03/20 1934 09/03/20 2243 09/04/20 0054 09/04/20 0645 09/04/20 1207  GLUCAP 390* 243* 168* 165* 237*   History of lupus Autoimmune hepatitis  -On mycophenolate and prednisone for autoimmune hepatitis..   Liver cirrhosis -Probably related to autoimmune hepatitis, right-sided heart failure -Continue home medications nadolol, spironolactone, lactulose Recent Labs  Lab 09/04/20 0336  AST 43*  ALT 92*  ALKPHOS 38  BILITOT 1.1  PROT 9.7*  ALBUMIN 3.0*   Morbid obesity - Body mass index is 55.14 kg/m. Patient has been advised to make an attempt to improve diet and exercise patterns to aid in weight loss.  OHS/OSA -Patient admits to morning headaches, somnolence at baseline and feeling poorly rested consistent with obstructive sleep apnea. -Nightly BiPAP in the hospital. -Recommend close monitoring with PCP and pulmonology for sleep study as soon as can be scheduled outpatient.  COPD, not in acute exacerbation  -Smokes 1 pack/day.  Claims she does not have COPD/emphysema.   -Continue nebulizer and steroids.  Mobility: Encourage ambulation.  PT eval Code Status:   Code Status: DNR  Nutritional status: Body mass index is 53.97 kg/m.     Diet Order            Diet  heart healthy/carb modified Room service appropriate? Yes; Fluid consistency: Thin  Diet effective now                 DVT prophylaxis:  Antimicrobials:  None Fluid: None Consultants: Cardiology, pulmonology Family Communication:  None at bedside  Status is: Inpatient  Remains inpatient appropriate because:Ongoing diagnostic testing needed not appropriate for outpatient work up and IV treatments appropriate due to intensity of illness or inability to take PO   Dispo: The patient is from: Home              Anticipated d/c is to: Home              Anticipated d/c date is: Hopefully discharge tomorrow.              Patient currently is not medically stable to d/c.    Infusions:    Scheduled Meds:  amLODipine  10 mg Oral Daily   buPROPion  150 mg Oral Daily   DULoxetine  60 mg Oral QHS   enoxaparin (LOVENOX) injection  75 mg Subcutaneous Q24H   insulin aspart  0-15 Units Subcutaneous TID WC   insulin aspart  0-5 Units Subcutaneous QHS   insulin aspart protamine- aspart  100 Units Subcutaneous TID AC   lactulose  20 g Oral TID   magnesium oxide  200 mg Oral QHS   methylPREDNISolone (SOLU-MEDROL) injection  80 mg Intravenous Q8H   mycophenolate  720 mg Oral BID   nadolol  80 mg Oral BID   omega-3 acid ethyl esters  1 g Oral Daily   potassium chloride  40 mEq Oral BID   sodium chloride flush  3 mL Intravenous Q12H   spironolactone  25 mg Oral Daily   [START ON 09/05/2020] torsemide  40 mg Oral Daily    Antimicrobials: Anti-infectives (From admission, onward)   Start     Dose/Rate Route Frequency Ordered Stop   08/28/20 1700  vancomycin (VANCOREADY) IVPB 1250 mg/250 mL  Status:  Discontinued        1,250 mg 166.7 mL/hr over 90 Minutes Intravenous Every 12 hours 08/28/20 0352 08/28/20 0545   08/28/20 1000  cefTRIAXone (ROCEPHIN) 2 g in sodium chloride 0.9 % 100 mL IVPB  Status:  Discontinued        2 g 200 mL/hr over 30 Minutes Intravenous Every 24 hours  08/28/20 0547 08/28/20 1550   08/28/20 1000  azithromycin (ZITHROMAX) 500 mg in sodium chloride 0.9 % 250 mL IVPB  Status:  Discontinued        500 mg 250 mL/hr over 60 Minutes Intravenous Every 24 hours 08/28/20 0547 08/28/20 1550   08/28/20 0400  ceFEPIme (MAXIPIME) 2 g in sodium chloride 0.9 % 100 mL IVPB  Status:  Discontinued        2 g 200 mL/hr over 30 Minutes Intravenous Every 8 hours 08/28/20 0345 08/28/20 0545   08/28/20 0400  vancomycin (VANCOCIN) 2,500 mg in sodium chloride 0.9 % 500 mL IVPB        2,500 mg 250 mL/hr over 120 Minutes Intravenous  Once 08/28/20 0345 08/28/20 6195  PRN meds: ALPRAZolam, hyoscyamine, ipratropium-albuterol, ondansetron **OR** ondansetron (ZOFRAN) IV, sodium chloride flush   Objective: Vitals:   09/04/20 0956 09/04/20 1207  BP:  (!) 143/110  Pulse:  65  Resp: 17 13  Temp:  99.1 F (37.3 C)  SpO2: 96% 90%    Intake/Output Summary (Last 24 hours) at 09/04/2020 1347 Last data filed at 09/03/2020 1945 Gross per 24 hour  Intake 299.44 ml  Output --  Net 299.44 ml   Filed Weights   09/03/20 0132 09/03/20 2212 09/04/20 0106  Weight: (!) 150.3 kg (!) 147.1 kg (!) 147.1 kg   Weight change: -3.2 kg Body mass index is 53.97 kg/m.   Physical Exam: General exam: Appears calm and comfortable.  Not in physical distress.  On supplemental oxygen. Skin: No rashes, lesions or ulcers. HEENT: Atraumatic, normocephalic, supple neck, no obvious bleeding Lungs: Clear to auscultation bilaterally CVS: Regular rate and rhythm, no murmur GI/Abd soft, distended from obesity, nontender, bowel sound present CNS: Alert, awake, oriented x3 Psychiatry: Mood appropriate Extremities: Continues to have trace bilateral pedal edema, no calf tenderness  Data Review: I have personally reviewed the laboratory data and studies available.  Recent Labs  Lab 08/30/20 0457 08/30/20 0457 08/31/20 0230 08/31/20 0230 09/01/20 0808 09/01/20 0808 09/01/20 1645  09/01/20 1646 09/01/20 1650 09/02/20 0327 09/03/20 0534  WBC 10.4  --  9.3  --  9.9  --   --   --   --  15.6* 15.3*  HGB 10.7*   < > 10.3*   < > 11.5*   < > 13.6 13.3 12.9 11.7* 12.5  HCT 33.8*   < > 33.1*   < > 36.1   < > 40.0 39.0 38.0 35.6* 38.2  MCV 102.4*  --  99.7  --  100.3*  --   --   --   --  97.8 98.5  PLT 196  --  211  --  162  --   --   --   --  281 303   < > = values in this interval not displayed.   Recent Labs  Lab 08/30/20 0457 08/30/20 0457 08/31/20 0230 08/31/20 0230 09/01/20 0808 09/01/20 0808 09/01/20 1645 09/01/20 1646 09/01/20 1650 09/02/20 0327 09/03/20 0534 09/04/20 0336  NA 134*   < > 134*   < > 130*   < > 141 140 142 134* 134*  --   K 3.4*   < > 3.5   < > 4.0   < > 3.5 3.5 3.3* 3.5 3.8  --   CL 97*  --  97*  --  95*  --   --   --   --  94* 96*  --   CO2 28  --  30  --  27  --   --   --   --  29 29  --   GLUCOSE 124*  --  177*  --  325*  --   --   --   --  145* 130*  --   BUN 28*  --  22  --  22  --   --   --   --  35* 36*  --   CREATININE 0.87   < > 0.83  --  0.85  --   --   --   --  0.95 0.93 1.13*  CALCIUM 7.8*  --  7.7*  --  7.9*  --   --   --   --  8.4* 8.6*  --   MG  --   --   --   --  1.8  --   --   --   --   --  2.1  --   PHOS  --   --   --   --  1.9*  --   --   --   --  2.0* 3.0 3.8   < > = values in this interval not displayed.    F/u labs ordered.  Signed, Terrilee Croak, MD [facility] 09/04/2020

## 2020-09-04 NOTE — Progress Notes (Addendum)
Progress Note  Patient Name: Monica Ellis Date of Encounter: 09/04/2020  [facility] Cardiologist: Evalina Field, MD   Subjective   Breathing improving, now in 3L oxygen.   Inpatient Medications    Scheduled Meds: . amLODipine  10 mg Oral Daily  . buPROPion  150 mg Oral Daily  . DULoxetine  60 mg Oral QHS  . enoxaparin (LOVENOX) injection  75 mg Subcutaneous Q24H  . insulin aspart  0-15 Units Subcutaneous TID WC  . insulin aspart  0-5 Units Subcutaneous QHS  . insulin aspart protamine- aspart  100 Units Subcutaneous TID AC  . lactulose  20 g Oral TID  . magnesium oxide  200 mg Oral QHS  . methylPREDNISolone (SOLU-MEDROL) injection  80 mg Intravenous Q8H  . mycophenolate  720 mg Oral BID  . nadolol  80 mg Oral BID  . omega-3 acid ethyl esters  1 g Oral Daily  . potassium chloride  40 mEq Oral BID  . sodium chloride flush  3 mL Intravenous Q12H  . spironolactone  25 mg Oral Daily   Continuous Infusions: . furosemide 120 mg (09/04/20 0820)   PRN Meds: ALPRAZolam, hyoscyamine, ipratropium-albuterol, ondansetron **OR** ondansetron (ZOFRAN) IV, sodium chloride flush   Vital Signs    Vitals:   09/04/20 0730 09/04/20 0815 09/04/20 0830 09/04/20 0956  BP: (!) 163/78     Pulse: (!) 50     Resp: _0 Temp: 98.7 F (37.1 C)     TempSrc: Oral     SpO2: 96% 94% 94% 96%  Weight:      Height:        Intake/Output Summary (Last 24 hours) at 09/04/2020 1037 Last data filed at 09/03/2020 1945 Gross per 24 hour  Intake 299.44 ml  Output --  Net 299.44 ml   Last 3 Weights 09/04/2020 09/03/2020 09/03/2020  Weight (lbs) 324 lb 4.8 oz 324 lb 4.8 oz 331 lb 5.6 oz  Weight (kg) 147.1 kg 147.1 kg 150.3 kg      Telemetry    Sinus rhythm with PACs, no afib  - Personally Reviewed  ECG   N/A  Physical Exam   GEN: Obese female in no acute distress.   Neck: No JVD Cardiac: RRR, no murmurs, rubs, or gallops.  Respiratory: Clear to auscultation  bilaterally. GI: Soft, nontender, non-distended  MS: No edema; No deformity. Neuro:  Nonfocal  Psych: Normal affect   Labs    High Sensitivity Troponin:   Recent Labs  Lab 08/28/20 0155 08/28/20 0647 08/28/20 0813  TROPONINIHS 16 18* 15      Chemistry Recent Labs  Lab 09/01/20 0808 09/01/20 0808 09/01/20 1645 09/01/20 1650 09/02/20 0327 09/03/20 0534 09/04/20 0336  NA 130*  --    < > 142 134* 134*  --   K 4.0  --    < > 3.3* 3.5 3.8  --   CL 95*  --   --   --  94* 96*  --   CO2 27  --   --   --  29 29  --   GLUCOSE 325*  --   --   --  145* 130*  --   BUN 22  --   --   --  35* 36*  --   CREATININE 0.85   < >  --   --  0.95 0.93 1.13*  CALCIUM 7.9*  --   --   --  8.4* 8.6*  --  PROT  --   --   --   --   --   --  9.7*  ALBUMIN  --   --   --   --   --   --  3.0*  AST  --   --   --   --   --   --  43*  ALT  --   --   --   --   --   --  92*  ALKPHOS  --   --   --   --   --   --  38  BILITOT  --   --   --   --   --   --  1.1  GFRNONAA >60   < >  --   --  >60 >60 52*  GFRAA >60   < >  --   --  >60 >60 >60  ANIONGAP 8  --   --   --  11 9  --    < > = values in this interval not displayed.     Hematology Recent Labs  Lab 09/01/20 0808 09/01/20 1645 09/01/20 1650 09/02/20 0327 09/03/20 0534  WBC 9.9  --   --  15.6* 15.3*  RBC 3.60*  --   --  3.64* 3.88  HGB 11.5*   < > 12.9 11.7* 12.5  HCT 36.1   < > 38.0 35.6* 38.2  MCV 100.3*  --   --  97.8 98.5  MCH 31.9  --   --  32.1 32.2  MCHC 31.9  --   --  32.9 32.7  RDW 14.4  --   --  14.3 14.5  PLT 162  --   --  281 303   < > = values in this interval not displayed.     Radiology    No results found.  Cardiac Studies   Cath 09/01/20 Findings:  RA = 15 RV = 80/18 PA = 80/35 (51) PCW = 26 Fick cardiac output/index = 7.2/2.9 PVR = 3.5 WU FA sat = 93% PA sat = 64%, 66% SVC sat = 60%  Assessment: 1. 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 Moderately elevated left-sided filling pressures 3. High  cardiac output with PVR 3.5 WU (given patient's body habitus this may be overestimated by Fick equation. Thermodilution not available on 5FR catheter) 4. 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 Continue diuresis and BIPAP. Will need weight loss.  Echo 09/02/20 1. Limited images- technically difficult, Definity not given  2. LV E' velocity is low at 5-6 cm/sec. Left ventricular ejection  fraction, by estimation, is 55 to 60%. The left ventricle has normal  function. The left ventricle has no regional wall motion abnormalities.  There is moderate left ventricular  hypertrophy. Left ventricular diastolic parameters are consistent with  Grade II diastolic dysfunction (pseudonormalization). Elevated left  ventricular end-diastolic pressure. The E/e' is 21.  3. Right ventricular systolic function is low normal. The right  ventricular size is mildly enlarged. Tricuspid regurgitation signal is  inadequate for assessing PA pressure.  4. The mitral valve was not well visualized. No evidence of mitral valve  regurgitation.  5. The aortic valve was not well visualized. Aortic valve regurgitation  is not visualized.  6. The inferior vena cava is normal in size with <50% respiratory  variability, suggesting right atrial pressure of 8 mmHg.    Patient Profile  59 y.o.femalewith a hx of autoimmune hepatitis/lupus on mycophenolate/prednisone, morbid obesity, DM type 2, tobacco abuse,suspectedobesity hypoventilation syndromewith recommended use of Trilogy ventilator at night,longstanding tobacco use (40 years),priorbrain tumor, GERDpresented for shortness of breath and admitted for acute on chronic hypoxic respiratory failure.  Assessment & Plan    1.Acute on chronichypoxic respiratory failure 2.Moderate to severe pulmonary hypertension 3. Chronic diastolic CHF 4. OSA and OHS  RHC as above. Echo was difficult  study showed LVEF of 55-60%, grade II DD, low normal RV function. Symptoms seems multifactorial.  Patient reported good diuresis yesterday as well however I & O +523cc in past 24 hours.  Doubt accurate.   Overall her symptoms is improving and requiring less oxygen every day. Did well with PT. Likely change to PO diuretics per MD today. SCR 1.13 today.    Appointment with Dr. Audie Box 10/26/20 @ 11am.    For questions or updates, please contact [facility] Please consult [email or web address] for contact info under        SignedLeanor Kail, PA  09/04/2020, 10:37 AM

## 2020-09-05 DIAGNOSIS — I509 Heart failure, unspecified: Secondary | ICD-10-CM

## 2020-09-05 LAB — CBC
HCT: 42.7 % (ref 36.0–46.0)
Hemoglobin: 13.7 g/dL (ref 12.0–15.0)
MCH: 30.9 pg (ref 26.0–34.0)
MCHC: 32.1 g/dL (ref 30.0–36.0)
MCV: 96.4 fL (ref 80.0–100.0)
Platelets: 317 10*3/uL (ref 150–400)
RBC: 4.43 MIL/uL (ref 3.87–5.11)
RDW: 13.9 % (ref 11.5–15.5)
WBC: 15.6 10*3/uL — ABNORMAL HIGH (ref 4.0–10.5)
nRBC: 0.1 % (ref 0.0–0.2)

## 2020-09-05 LAB — BASIC METABOLIC PANEL
Anion gap: 6 (ref 5–15)
BUN: 42 mg/dL — ABNORMAL HIGH (ref 8–23)
CO2: 33 mmol/L — ABNORMAL HIGH (ref 22–32)
Calcium: 9.4 mg/dL (ref 8.9–10.3)
Chloride: 95 mmol/L — ABNORMAL LOW (ref 98–111)
Creatinine, Ser: 1.05 mg/dL — ABNORMAL HIGH (ref 0.44–1.00)
GFR calc Af Amer: >60 mL/min (ref >60)
GFR calc non Af Amer: 57 mL/min — ABNORMAL LOW (ref >60)
Glucose, Bld: 73 mg/dL (ref 70–99)
Potassium: 4 mmol/L (ref 3.5–5.1)
Sodium: 134 mmol/L — ABNORMAL LOW (ref 135–145)

## 2020-09-05 LAB — HEPATIC FUNCTION PANEL
ALT: 94 U/L — ABNORMAL HIGH (ref 0–44)
AST: 44 U/L — ABNORMAL HIGH (ref 15–41)
Albumin: 3.1 g/dL — ABNORMAL LOW (ref 3.5–5.0)
Alkaline Phosphatase: 36 U/L — ABNORMAL LOW (ref 38–126)
Bilirubin, Direct: 0.2 mg/dL (ref 0.0–0.2)
Indirect Bilirubin: 0.5 mg/dL (ref 0.3–0.9)
Total Bilirubin: 0.7 mg/dL (ref 0.3–1.2)
Total Protein: 9.7 g/dL — ABNORMAL HIGH (ref 6.5–8.1)

## 2020-09-05 LAB — GLUCOSE, CAPILLARY
Glucose-Capillary: 77 mg/dL (ref 70–99)
Glucose-Capillary: 113 mg/dL — ABNORMAL HIGH (ref 70–99)

## 2020-09-05 MED ORDER — POTASSIUM CHLORIDE CRYS ER 20 MEQ PO TBCR: 40.0000 meq | EXTENDED_RELEASE_TABLET | Freq: Every day | ORAL | 0 refills | Status: DC

## 2020-09-05 MED ORDER — TORSEMIDE 20 MG PO TABS: 40.0000 mg | ORAL_TABLET | Freq: Every day | ORAL | 0 refills | Status: DC

## 2020-09-05 MED ORDER — POTASSIUM CHLORIDE CRYS ER 20 MEQ PO TBCR: 40.0000 meq | EXTENDED_RELEASE_TABLET | Freq: Every day | ORAL | Status: DC

## 2020-09-05 NOTE — Progress Notes (Signed)
Physical Therapy Treatment Patient Details Name: Monica Ellis MRN: [id number] DOB: 1961/07/13 Today's Date: 09/05/2020    History of Present Illness 59 y.o. female with a hx of autoimmune hepatitis/lupus on mycophenolate/prednisone, morbid obesity, DM type 2, tobacco abuse, suspected obesity hypoventilation syndrome with recommended use of Trilogy ventilator at night, longstanding tobacco use (40 years), prior brain tumor, GERD  presented for shortness of breath and admitted for acute on chronic hypoxic respiratory failure    PT Comments    Pt admitted with above diagnosis. Pt was able to ambulate in hallway and incr distance. IS safe with the RW with mobility.  Needed 3LO2 to maintain sats >90%.  Pt currently with functional limitations due to endurance  deficits. Pt will benefit from skilled PT to increase their independence and safety with mobility to allow discharge to the venue listed below.     Follow Up Recommendations  Home health PT;Other (comment) (to work toward pulmonary rehab vs no f/u)     Equipment Recommendations  None recommended by PT    Recommendations for Other Services       Precautions / Restrictions Restrictions Weight Bearing Restrictions: No    Mobility  Bed Mobility Overal bed mobility: Modified Independent                Transfers Overall transfer level: Modified independent               General transfer comment: up without use of hands  Ambulation/Gait Ambulation/Gait assistance: Supervision Gait Distance (Feet): 90 Feet Assistive device: Rolling walker (2 wheeled) Gait Pattern/deviations: Step-through pattern;Decreased stride length Gait velocity: slower Gait velocity interpretation: <1.8 ft/sec, indicate of risk for recurrent falls General Gait Details: slow and steady with the RW, sats on 3L maintained in low 90's and rose to higher 90's at rest.   Stairs             Wheelchair Mobility    Modified Rankin  (Stroke Patients Only)       Balance Overall balance assessment: No apparent balance deficits (not formally assessed)                                          Cognition Arousal/Alertness: Awake/alert Behavior During Therapy: WFL for tasks assessed/performed Overall Cognitive Status: Within Functional Limits for tasks assessed                                        Exercises      General Comments        Pertinent Vitals/Pain Faces Pain Scale: No hurt    Home Living                      Prior Function            PT Goals (current goals can now be found in the care plan section) Acute Rehab PT Goals Patient Stated Goal: progressively get my breath back Progress towards PT goals: Progressing toward goals    Frequency    Min 3X/week      PT Plan Current plan remains appropriate    Co-evaluation              AM-PAC PT "6 Clicks" Mobility   Outcome Measure  Help needed turning from your back  to your side while in a flat bed without using bedrails?: None Help needed moving from lying on your back to sitting on the side of a flat bed without using bedrails?: None Help needed moving to and from a bed to a chair (including a wheelchair)?: None Help needed standing up from a chair using your arms (e.g., wheelchair or bedside chair)?: None Help needed to walk in hospital room?: None Help needed climbing 3-5 steps with a railing? : A Little 6 Click Score: 23    End of Session Equipment Utilized During Treatment: Oxygen;Gait belt Activity Tolerance: Patient tolerated treatment well;Patient limited by fatigue Patient left: in bed;with call bell/phone within reach Nurse Communication: Mobility status PT Visit Diagnosis: Other abnormalities of gait and mobility (R26.89);Difficulty in walking, not elsewhere classified (R26.2)     Time: 1610-9604 PT Time Calculation (min) (ACUTE ONLY): 29 min  Charges:  $Gait  Training: 23-37 mins                     Jhordan Kinter W,PT [place] Pager:  [phone]  Office:  [place] 09/05/2020, 1:49 PM

## 2020-09-05 NOTE — Discharge Summary (Signed)
Physician Discharge Summary  Monica Ellis WEX:[id number] DOB: [birth year] DOA: 08/28/2020  PCP: Ferd Hibbs, NP  Admit date: 08/28/2020 Discharge date: 09/05/2020  Admitted From: Home Discharge disposition: Home   Code Status: DNR  Diet Recommendation: Cardiac/Diabetic diet  Discharge Diagnosis:   Principal Problem:   Acute on chronic respiratory failure with hypoxia ([place]) Active Problems:   Acute exacerbation of CHF (congestive heart failure) ([place])   Uncontrolled type 2 diabetes mellitus ([place])   Lupus ([place])   Autoimmune hepatitis ([place])   Essential hypertension   Class 3 severe obesity due to excess calories with serious comorbidity and body mass index (BMI) of 40.0 to 44.9 in adult [facility])  History of Present Illness / Brief narrative:  Monica Ellis is a 59 y.o. female with PMH of morbid obesity, OHS/OSA, chronic smoking, COPD on 2 L O2, DM 2, autoimmune hepatitis/lupus, liver cirrhosis Patient presented to the ED on 08/28/2020 with 2-day history of worsening shortness of breath associated with chest pressure, cough.  Reports weight gain of about 100 pounds in last few months.  She was hospitalized 2 months ago for similar symptoms and improved with diuresis.  In the ED, she was hypoxic and required nonrebreather mask.  Chest x-ray showed bilateral infiltrates. Troponin negative, BNP was 167 Covid PCR negative. Blood gas with pH 7.3, PCO2 62 CT angio chest negative for pulmonary embolism. Patient was admitted to hospitalist service for further evaluation management  Subjective:  Seen and examined this morning.  Sitting up in bed.  Not in distress. Seen by cardiology.  Okay to discharge to home.  Hospital Course:  Acute on chronic respiratory failure with hypoxia and hypercarbia -Primarily due to acute CHF exacerbation.  Also contributed by morbid obesity, OHS and OSA -Chronically on 2 L oxygen at home. Currently requiring higher flow.  Acute  exacerbation of diastolic CHF and right-sided heart failure Moderate to severe pulmonary hypertension -Presented with dyspnea, lower extremity edema, bibasilar rales -BNP 167. -Cardiology consult appreciated. -Echocardiogram 9/28 with a EF 55 to 93%, grade 2 diastolic dysfunction -Right heart cath 9/27 showed moderate to severe pulmonary hypertension -Adequately diuresed in the hospital with Lasix 120 mg IV twice daily.  -Per cardiology recommendation, will discharge the patient home on torsemide 40 mg daily with an additional 40 mg in the afternoon for weight gain or shortness of breath.  Patient will continue potassium supplement at 40 mEq daily.  She will also continue her other blood pressure medications including amlodipine, Aldactone and nadolol. -Patient will follow up with cardiology as an outpatient.  Hypokalemia/hypomagnesemia/hypophosphatemia -Levels being monitored while on aggressive diuresis.  Replace as needed. Recent Labs  Lab 09/01/20 0808 09/01/20 1645 09/01/20 1646 09/01/20 1650 09/02/20 0327 09/03/20 0534 09/04/20 0336 09/05/20 0433  K 4.0   < > 3.5 3.3* 3.5 3.8  --  4.0  MG 1.8  --   --   --   --  2.1  --   --   PHOS 1.9*  --   --   --  2.0* 3.0 3.8  --    < > = values in this interval not displayed.   Diabetes mellitus type 2-A1c 6.2 on 9/23. -Continue home regimen NovoLog 70/30, 120 units 3 times daily   History of lupus Autoimmune hepatitis  -On mycophenolate and prednisone for autoimmune hepatitis..   Liver cirrhosis -Probably related to autoimmune hepatitis, right-sided heart failure -Continue home medications nadolol, spironolactone, lactulose Recent Labs  Lab 09/04/20 0336 09/05/20 0433  AST 43* 44*  ALT 92* 94*  ALKPHOS 38 36*  BILITOT 1.1 0.7  PROT 9.7* 9.7*  ALBUMIN 3.0* 3.1*    Morbid obesity - Body mass index is 55.14 kg/m. Patient has been advised to make an attempt to improve diet and exercise patterns to aid in weight  loss.  OHS/OSA -Patient admits to morning headaches, somnolence at baseline and feeling poorly rested consistent with obstructive sleep apnea. -Nightly BiPAP in the hospital. -Recommend close monitoring with PCP and pulmonology for sleep study as soon as can be scheduledoutpatient.  COPD, not in acute exacerbation -Smokes 1 pack/day.  Claims she does not have COPD/emphysema.   -Continue nebulizer and steroids.   Wound care:    Discharge Exam:   Vitals:   09/04/20 2350 09/05/20 0351 09/05/20 0500 09/05/20 0820  BP:  (!) 136/49  (!) 159/90  Pulse: (!) 46 (!) 44  (!) 50  Resp: _0 Temp:  98.6 F (37 C)  99 F (37.2 C)  TempSrc:  Axillary    SpO2: 99% 98%  94%  Weight:   (!) 147 kg   Height:        Body mass index is 53.93 kg/m.  General exam: Appears calm and comfortable.  Not in distress Skin: No rashes, lesions or ulcers. HEENT: Atraumatic, normocephalic, supple neck, no obvious bleeding Lungs: Clear tonsils bilaterally CVS: Regular rate and rhythm, no murmur GI/Abd soft, nontender, nondistended, some present CNS: Alert, awake, oriented x3 Psychiatry: Mood appropriate Extremities: Trace bilateral pedal edema, no calf tenderness  Follow ups:   Discharge Instructions    Diet Carb Modified   Complete by: As directed    Increase activity slowly   Complete by: As directed       Follow-up Information    Brand Males, MD Follow up on 10/02/2020.   Specialty: Pulmonary Disease Why: Your appointment is at [place] information: [place] [address] [phone]        Geralynn Rile, MD. Go on 09/25/2020.   Specialties: Internal Medicine, Cardiology, Radiology Why: _1  for hospital follow up. Please arrive 10 minutes early.  Contact information: [place] [address] [phone]               Recommendations for Outpatient Follow-Up:   1. Follow-up with PCP as an  outpatient 2. Follow-up with cardiology as an outpatient  Discharge Instructions:  Follow with Primary MD Ferd Hibbs, NP in 7 days   Get CBC/BMP checked in next visit within 1 week by PCP or SNF MD ( we routinely change or add medications that can affect your baseline labs and fluid status, therefore we recommend that you get the mentioned basic workup next visit with your PCP, your PCP may decide not to get them or add new tests based on their clinical decision)  On your next visit with your PCP, please Get Medicines reviewed and adjusted.  Please request your PCP  to go over all Hospital Tests and Procedure/Radiological results at the follow up, please get all Hospital records sent to your Prim MD by signing hospital release before you go home.  Activity: As tolerated with Full fall precautions use walker/cane & assistance as needed  For Heart failure patients - Check your Weight same time everyday, if you gain over 2 pounds, or you develop in leg swelling, experience more shortness of breath or chest pain, call your Primary MD immediately. Follow Cardiac Low Salt Diet and 1.5 lit/day  fluid restriction.  If you have smoked or chewed Tobacco in the last 2 yrs please stop smoking, stop any regular Alcohol  and or any Recreational drug use.  If you experience worsening of your admission symptoms, develop shortness of breath, life threatening emergency, suicidal or homicidal thoughts you must seek medical attention immediately by calling 911 or calling your MD immediately  if symptoms less severe.  You Must read complete instructions/literature along with all the possible adverse reactions/side effects for all the Medicines you take and that have been prescribed to you. Take any new Medicines after you have completely understood and accpet all the possible adverse reactions/side effects.   Do not drive, operate heavy machinery, perform activities at heights, swimming or participation in  water activities or provide baby sitting services if your were admitted for syncope or siezures until you have seen by Primary MD or a Neurologist and advised to do so again.  Do not drive when taking Pain medications.  Do not take more than prescribed Pain, Sleep and Anxiety Medications  Wear Seat belts while driving.   Please note You were cared for by a hospitalist during your hospital stay. If you have any questions about your discharge medications or the care you received while you were in the hospital after you are discharged, you can call the unit and asked to speak with the hospitalist on call if the hospitalist that took care of you is not available. Once you are discharged, your primary care physician will handle any further medical issues. Please note that NO REFILLS for any discharge medications will be authorized once you are discharged, as it is imperative that you return to your primary care physician (or establish a relationship with a primary care physician if you do not have one) for your aftercare needs so that they can reassess your need for medications and monitor your lab values.    Allergies as of 09/05/2020      Reactions   Ace Inhibitors Swelling   Imuran [azathioprine] Nausea And Vomiting   Severe N/V/D and AKI after starting the med.   Lisinopril Swelling, Other (See Comments)   Other reaction(s): Angioedema (ALLERGY/intolerance), Face   Protonix [pantoprazole] Swelling   Pt states that it causes her lips to swell.    Tramadol Other (See Comments)   Other reaction(s): Mental Status Changes (intolerance)   Iodinated Diagnostic Agents Swelling   When patient was in her 20's, swelled up all over after getting getting injection of contrast; did not have any other symptoms/ was given benadryl after that happened and had no further problems per patients/   Iodine Swelling   Janumet [sitagliptin-metformin Hcl] Other (See Comments)   Continuous yeast infection    Omeprazole Swelling   Lips will swell   Victoza [liraglutide] Nausea And Vomiting      Medication List    STOP taking these medications   furosemide 40 MG tablet Commonly known as: LASIX     TAKE these medications   amLODipine 10 MG tablet Commonly known as: NORVASC Take 10 mg by mouth daily.   ascorbic acid 500 MG tablet Commonly known as: VITAMIN C Take 2,000 mg by mouth daily.   buPROPion 150 MG 24 hr tablet Commonly known as: WELLBUTRIN XL Take 150 mg by mouth daily.   DULoxetine 60 MG capsule Commonly known as: CYMBALTA Take 60 mg by mouth at bedtime.   fexofenadine-pseudoephedrine 180-240 MG 24 hr tablet Commonly known as: ALLEGRA-D 24 Take 1 tablet by  mouth daily.   FISH OIL PO Take 1 capsule by mouth every evening.   insulin lispro protamine-lispro (75-25) 100 UNIT/ML Susp injection Commonly known as: HUMALOG 75/25 MIX Inject 120 Units into the skin 3 (three) times daily with meals.   lactulose 10 GM/15ML solution Commonly known as: CHRONULAC Take 30 mLs by mouth 3 (three) times daily as needed for constipation.   Levsin 0.125 MG tablet Generic drug: hyoscyamine Take 0.125 mg by mouth every 4 (four) hours as needed for spasms.   LORazepam 0.5 MG tablet Commonly known as: ATIVAN Take 0.5 mg by mouth 3 (three) times daily as needed for anxiety.   magnesium 30 MG tablet Take 30 mg by mouth at bedtime.   multivitamin tablet Take 1 tablet by mouth daily.   mycophenolate 360 MG Tbec EC tablet Commonly known as: MYFORTIC Take 720 mg by mouth 2 (two) times daily.   nadolol 80 MG tablet Commonly known as: CORGARD Take 80 mg by mouth 2 (two) times daily.   NASAL SALINE NA Place 1 spray into the nose daily as needed (Congestion and Allergies).   oxyCODONE 10 mg 12 hr tablet Commonly known as: OXYCONTIN Take 10 mg by mouth every 6 (six) hours as needed for pain.   potassium chloride SA 20 MEQ tablet Commonly known as: KLOR-CON Take 2 tablets (40  mEq total) by mouth daily. What changed: how much to take   predniSONE 5 MG tablet Commonly known as: DELTASONE Take 12.5 mg by mouth daily with breakfast.   spironolactone 25 MG tablet Commonly known as: ALDACTONE Take 1 tablet (25 mg total) by mouth daily.   torsemide 20 MG tablet Commonly known as: DEMADEX Take 2 tablets (40 mg total) by mouth daily. Start taking on: September 06, 2020   TURMERIC PO Take 400 mg by mouth daily.   VITAMIN D PO Take 1 tablet by mouth every evening.   zolpidem 10 MG tablet Commonly known as: AMBIEN Take 10 mg by mouth at bedtime.            Durable Medical Equipment  (From admission, onward)         Start     Ordered   09/05/20 1216  DME Oxygen  Once       Question Answer Comment  Length of Need Lifetime   Mode or (Route) Nasal cannula   Liters per Minute 4   Frequency Continuous (stationary and portable oxygen unit needed)   Oxygen conserving device Yes   Oxygen delivery system Gas      09/05/20 1216          Time coordinating discharge: 35 minutes  The results of significant diagnostics from this hospitalization (including imaging, microbiology, ancillary and laboratory) are listed below for reference.    Procedures and Diagnostic Studies:   DG Chest Port 1 View  Result Date: 08/28/2020 CLINICAL DATA:  Shortness of breath and fevers EXAM: PORTABLE CHEST 1 VIEW COMPARISON:  06/10/2020 FINDINGS: Cardiac shadow is prominent but stable. Aortic calcifications are noted. Bibasilar infiltrates are noted new from the prior exam. No sizable effusion is seen. No bony abnormality is noted. IMPRESSION: Bibasilar airspace opacities.  Correlate with COVID-19 testing. Electronically Signed   By: Inez Catalina M.D.   On: 08/28/2020 02:14     Labs:   Basic Metabolic Panel: Recent Labs  Lab 08/31/20 0230 08/31/20 0230 09/01/20 0808 09/01/20 1645 09/01/20 1646 09/01/20 1646 09/01/20 1650 09/01/20 1650 09/02/20 0327  09/02/20 0327 09/03/20 0534 09/04/20  9741 09/05/20 0433  NA 134*   < > 130*   < > 140  --  142  --  134*  --  134*  --  134*  K 3.5   < > 4.0   < > 3.5   < > 3.3*   < > 3.5   < > 3.8  --  4.0  CL 97*  --  95*  --   --   --   --   --  94*  --  96*  --  95*  CO2 30  --  27  --   --   --   --   --  29  --  29  --  33*  GLUCOSE 177*  --  325*  --   --   --   --   --  145*  --  130*  --  73  BUN 22  --  22  --   --   --   --   --  35*  --  36*  --  42*  CREATININE 0.83   < > 0.85  --   --   --   --   --  0.95  --  0.93 1.13* 1.05*  CALCIUM 7.7*  --  7.9*  --   --   --   --   --  8.4*  --  8.6*  --  9.4  MG  --   --  1.8  --   --   --   --   --   --   --  2.1  --   --   PHOS  --   --  1.9*  --   --   --   --   --  2.0*  --  3.0 3.8  --    < > = values in this interval not displayed.   GFR Estimated Creatinine Clearance: 82.6 mL/min (A) (by C-G formula based on SCr of 1.05 mg/dL (H)). Liver Function Tests: Recent Labs  Lab 09/04/20 0336 09/05/20 0433  AST 43* 44*  ALT 92* 94*  ALKPHOS 38 36*  BILITOT 1.1 0.7  PROT 9.7* 9.7*  ALBUMIN 3.0* 3.1*   No results for input(s): LIPASE, AMYLASE in the last 168 hours. No results for input(s): AMMONIA in the last 168 hours. Coagulation profile No results for input(s): INR, PROTIME in the last 168 hours.  CBC: Recent Labs  Lab 08/31/20 0230 08/31/20 0230 09/01/20 0808 09/01/20 1645 09/01/20 1646 09/01/20 1650 09/02/20 0327 09/03/20 0534 09/05/20 0433  WBC 9.3  --  9.9  --   --   --  15.6* 15.3* 15.6*  HGB 10.3*   < > 11.5*   < > 13.3 12.9 11.7* 12.5 13.7  HCT 33.1*   < > 36.1   < > 39.0 38.0 35.6* 38.2 42.7  MCV 99.7  --  100.3*  --   --   --  97.8 98.5 96.4  PLT 211  --  162  --   --   --  281 303 317   < > = values in this interval not displayed.   Cardiac Enzymes: No results for input(s): CKTOTAL, CKMB, CKMBINDEX, TROPONINI in the last 168 hours. BNP: Invalid input(s): POCBNP CBG: Recent Labs  Lab 09/04/20 1207  09/04/20 1616 09/04/20 2112 09/05/20 0821 09/05/20 1159  GLUCAP 237* 199* 214* 77 113*   D-Dimer No results for input(s): DDIMER in the  last 72 hours. Hgb A1c No results for input(s): HGBA1C in the last 72 hours. Lipid Profile No results for input(s): CHOL, HDL, LDLCALC, TRIG, CHOLHDL, LDLDIRECT in the last 72 hours. Thyroid function studies No results for input(s): TSH, T4TOTAL, T3FREE, THYROIDAB in the last 72 hours.  Invalid input(s): FREET3 Anemia work up No results for input(s): VITAMINB12, FOLATE, FERRITIN, TIBC, IRON, RETICCTPCT in the last 72 hours. Microbiology Recent Results (from the past 240 hour(s))  SARS Coronavirus 2 by RT PCR (hospital order, performed in [facility] hospital lab) Nasopharyngeal Nasopharyngeal Swab     Status: None   Collection Time: 08/28/20  1:38 AM   Specimen: Nasopharyngeal Swab  Result Value Ref Range Status   SARS Coronavirus 2 NEGATIVE NEGATIVE Final    Comment: (NOTE) SARS-CoV-2 target nucleic acids are NOT DETECTED.  The SARS-CoV-2 RNA is generally detectable in upper and lower respiratory specimens during the acute phase of infection. The lowest concentration of SARS-CoV-2 viral copies this assay can detect is 250 copies / mL. A negative result does not preclude SARS-CoV-2 infection and should not be used as the sole basis for treatment or other patient management decisions.  A negative result may occur with improper specimen collection / handling, submission of specimen other than nasopharyngeal swab, presence of viral mutation(s) within the areas targeted by this assay, and inadequate number of viral copies (<250 copies / mL). A negative result must be combined with clinical observations, patient history, and epidemiological information.  Fact Sheet for Patients:   StrictlyIdeas.no  Fact Sheet for Healthcare Providers: BankingDealers.co.za  This test is not yet approved or   cleared by the [place] FDA and has been authorized for detection and/or diagnosis of SARS-CoV-2 by FDA under an Emergency Use Authorization (EUA).  This EUA will remain in effect (meaning this test can be used) for the duration of the COVID-19 declaration under Section 564(b)(1) of the Act, 21 U.S.C. section 360bbb-3(b)(1), unless the authorization is terminated or revoked sooner.  Performed at [facility], [place] [address] [place], [place] [address]   Blood culture (routine x 2)     Status: None   Collection Time: 08/28/20  1:55 AM   Specimen: BLOOD  Result Value Ref Range Status   Specimen Description BLOOD SITE NOT SPECIFIED  Final   Special Requests   Final    BOTTLES DRAWN AEROBIC AND ANAEROBIC Blood Culture adequate volume   Culture   Final    NO GROWTH 5 DAYS Performed at [facility], [address] [place], [place] [address]    Report Status 09/02/2020 FINAL  Final  Urine culture     Status: None   Collection Time: 08/28/20  1:55 AM   Specimen: Urine, Random  Result Value Ref Range Status   Specimen Description URINE, RANDOM  Final   Special Requests NONE  Final   Culture   Final    NO GROWTH Performed at [facility], [place] [address], [place], [place] [address]    Report Status 08/29/2020 FINAL  Final  Blood culture (routine x 2)     Status: None   Collection Time: 08/28/20  3:15 AM   Specimen: BLOOD RIGHT HAND  Result Value Ref Range Status   Specimen Description BLOOD RIGHT HAND  Final   Special Requests   Final    BOTTLES DRAWN AEROBIC AND ANAEROBIC Blood Culture adequate volume   Culture   Final    NO GROWTH 5 DAYS Performed  at [facility], [place] [address], [place], [place] [address]    Report Status 09/02/2020 FINAL  Final     Signed: Marlowe Aschoff Rommie Dunn  [facility] 09/05/2020, 12:17 PM

## 2020-09-05 NOTE — Discharge Instructions (Signed)
Acute Respiratory Failure, Adult  Acute respiratory failure occurs when there is not enough oxygen passing from your lungs to your body. When this happens, your lungs have trouble removing carbon dioxide from the blood. This causes your blood oxygen level to drop too low as carbon dioxide builds up. Acute respiratory failure is a medical emergency. It can develop quickly, but it is temporary if treated promptly. Your lung capacity, or how much air your lungs can hold, may improve with time, exercise, and treatment. What are the causes? There are many possible causes of acute respiratory failure, including:  Lung injury.  Chest injury or damage to the ribs or tissues near the lungs.  Lung conditions that affect the flow of air and blood into and out of the lungs, such as pneumonia, acute respiratory distress syndrome, and cystic fibrosis.  Medical conditions, such as strokes or spinal cord injuries, that affect the muscles and nerves that control breathing.  Blood infection (sepsis).  Inflammation of the pancreas (pancreatitis).  A blood clot in the lungs (pulmonary embolism).  A large-volume blood transfusion.  Burns.  Near-drowning.  Seizure.  Smoke inhalation.  Reaction to medicines.  Alcohol or drug overdose. What increases the risk? This condition is more likely to develop in people who have:  A blocked airway.  Asthma.  A condition or disease that damages or weakens the muscles, nerves, bones, or tissues that are involved in breathing.  A serious infection.  A health problem that blocks the unconscious reflex that is involved in breathing, such as hypothyroidism or sleep apnea.  A lung injury or trauma. What are the signs or symptoms? Trouble breathing is the main symptom of acute respiratory failure. Symptoms may also include:  Rapid breathing.  Restlessness or anxiety.  Skin, lips, or fingernails that appear blue (cyanosis).  Rapid heart  rate.  Abnormal heart rhythms (arrhythmias).  Confusion or changes in behavior.  Tiredness or loss of energy.  Feeling sleepy or having a loss of consciousness. How is this diagnosed? Your health care provider can diagnose acute respiratory failure with a medical history and physical exam. During the exam, your health care provider will listen to your heart and check for crackling or wheezing sounds in your lungs. Your may also have tests to confirm the diagnosis and determine what is causing respiratory failure. These tests may include:  Measuring the amount of oxygen in your blood (pulse oximetry). The measurement comes from a small device that is placed on your finger, earlobe, or toe.  Other blood tests to measure blood gases and to look for signs of infection.  Sampling your cerebral spinal fluid or tracheal fluid to check for infections.  Chest X-ray to look for fluid in spaces that should be filled with air.  Electrocardiogram (ECG) to look at the heart's electrical activity. How is this treated? Treatment for this condition usually takes places in a hospital intensive care unit (ICU). Treatment depends on what is causing the condition. It may include one or more treatments until your symptoms improve. Treatment may include:  Supplemental oxygen. Extra oxygen is given through a tube in the nose, a face mask, or a hood.  A device such as a continuous positive airway pressure (CPAP) or bi-level positive airway pressure (BiPAP or BPAP) machine. This treatment uses mild air pressure to keep the airways open. A mask or other device will be placed over your nose or mouth. A tube that is connected to a motor will deliver oxygen through the  mask.  Ventilator. This treatment helps move air into and out of the lungs. This may be done with a bag and mask or a machine. For this treatment, a tube is placed in your windpipe (trachea) so air and oxygen can flow to the lungs.  Extracorporeal  membrane oxygenation (ECMO). This treatment temporarily takes over the function of the heart and lungs, supplying oxygen and removing carbon dioxide. ECMO gives the lungs a chance to recover. It may be used if a ventilator is not effective.  Tracheostomy. This is a procedure that creates a hole in the neck to insert a breathing tube.  Receiving fluids and medicines.  Rocking the bed to help breathing. Follow these instructions at home:  Take over-the-counter and prescription medicines only as told by your health care provider.  Return to normal activities as told by your health care provider. Ask your health care provider what activities are safe for you.  Keep all follow-up visits as told by your health care provider. This is important. How is this prevented? Treating infections and medical conditions that may lead to acute respiratory failure can help prevent the condition from developing. Contact a health care provider if:  You have a fever.  Your symptoms do not improve or they get worse. Get help right away if:  You are having trouble breathing.  You lose consciousness.  Your have cyanosis or turn blue.  You develop a rapid heart rate.  You are confused. These symptoms may represent a serious problem that is an emergency. Do not wait to see if the symptoms will go away. Get medical help right away. Call your local emergency services (911 in the [place] Do not drive yourself to the hospital. This information is not intended to replace advice given to you by your health care provider. Make sure you discuss any questions you have with your health care provider. Document Revised: 11/04/2017 Document Reviewed: 06/09/2016 Elsevier Patient Education  [place].    Chronic Respiratory Failure  Respiratory failure is a condition in which the lungs do not work well and the breathing (respiratory) system fails. When respiratory failure occurs, it becomes difficult for the  lungs to get enough oxygen or to eliminate carbon dioxide or to do both duties. If the lungs do not work properly, the heart, brain, and other body systems do not get enough oxygen. Respiratory failure is life-threatening if it is not treated. Respiratory failure can be acute or chronic. Acute respiratory failure is sudden and severe and requires emergency medical treatment. Chronic respiratory failure happens over time, usually due to a medical condition that gets worse. What are the causes? This condition may be caused by any problem that affects the heart or lungs. Causes include:  Chronic bronchitis and emphysema (COPD).  Pulmonary fibrosis.  Water in the lungs due to heart failure, lung injury, or infection (pulmonary edema).  Asthma.  Nerve or muscle diseases that make chest movements difficult, such as Leta Baptist disease or Guillain-Barre syndrome.  A collapsed lung (pneumothorax).  Pulmonary hypertension.  Chronic sleep apnea.  Pneumonia.  Obesity.  A blood clot in a lung (pulmonary embolism).  Trauma to the chest that makes breathing difficult. What increases the risk? You are more likely to develop this condition if:  You are a smoker, or have a history of smoking.  You have a weak immune system.  You have a family history of breathing problems or lung disease.  You have a long term lung disease such  as COPD. What are the signs or symptoms? Symptoms of this condition include:  Shortness of breath with or without activity.  Difficulty breathing.  Wheezing.  A fast or irregular heartbeat (arrhythmia).  Chest pain or tightness.  A bluish color to the fingernail or toenail beds (cyanosis).  Confusion.  Drowsiness.  Extreme fatigue, especially with minimal activity. How is this diagnosed? This condition may be diagnosed based on:  Your medical history.  A physical exam.  Other tests, such as: ? A chest X-ray. ? A CT scan of your lungs. ? Blood  tests, such as an arterial blood gas test. This test is done to check if you have enough oxygen in your blood. ? An electrocardiogram. This test records the electrical activity of your heart. ? An echocardiogram. This test uses sound waves to produce an image of your heart.  A check of your blood pressure, heart rate, breathing rate, and blood oxygen level. How is this treated? Treatment for this condition depends on the cause. Treatment can include the following:  Getting oxygen through a nasal cannula. This is a tube that goes in your nose.  Getting oxygen through a face mask.  Receiving noninvasive positive pressure ventilation. This is a method of breathing support in which a machine blows air into your lungs through a mask. The machine allows you to breathe on your own. It helps the body take in oxygen and eliminate carbon dioxide.  Using a ventilator. This is a breathing machine that delivers oxygen to the lungs through a breathing tube that is put into the trachea. This machine is used when you can no longer breathe well enough on your own.  Medicines to help with breathing, such as: ? Medicines that open up and relax air passages, such as bronchodilators. These may be given through a device that turns liquid medicines into a mist you can breathe in (nebulizer). These medicines help with breathing. ? Diuretics. These medicines get rid of extra fluid out of your lungs, which can help you breathe better. ? Steroid medicines. These decrease inflammation in the lungs. ? Antibiotic medicines. These may be given to treat a bacterial infection, such as pneumonia.  Pulmonary rehabilitation. This is an exercise program that strengthens the muscles in your chest and helps you learn breathing techniques in order to manage your condition. Follow these instructions at home: Medicines  Take over-the-counter and prescription medicines only as told by your health care provider.  If you were  prescribed an antibiotic medicine, take it as told by your health care provider. Do not stop taking the antibiotic even if you start to feel better. General instructions  Use oxygen therapy and pulmonary rehabilitation if directed to by your health care provider. If you require home oxygen therapy, ask your health care provider whether you should purchase a pulse oximeter to measure your oxygen level at home.  Work with your health care provider to create a plan to help you deal with your condition. Follow this plan.  Do not use any products that contain nicotine or tobacco, such as cigarettes and e-cigarettes. If you need help quitting, ask your health care provider.  Avoid exposure to irritants that make your breathing problems worse. These include smoke, chemicals, and fumes.  Stay active, but balance activity with periods of rest. Exercise and physical activity will help you maintain your ability to do things you want to do.  Stay up to date on all vaccines, especially yearly influenza and pneumonia vaccines.  Avoid people who are sick as well as crowded places during the flu season.  Keep all follow-up visits as told by your health care provider. This is important. Contact a health care provider if:  Your shortness of breath gets worse and you cannot do the things you used to do.  You have increased mucus (sputum), wheezing, coughing, or loss of energy.  You are on oxygen therapy and you are starting to need more.  You need to use your medicines more often.  You have a fever. Get help right away if:  Your shortness of breath becomes worse.  You are unable to say more than a few words without having to catch your breath.  You develop chest pain or tightness. Summary  Respiratory failure is a condition in which the lungs do not work well and the breathing system fails.  This condition can be very serious and is often life-threatening.  This condition is diagnosed with  tests and can be treated with medicines or oxygen.  Contact a health care provider if your shortness of breath gets worse or if you need to use your oxygen or medicines more often than before. This information is not intended to replace advice given to you by your health care provider. Make sure you discuss any questions you have with your health care provider. Document Revised: 11/04/2017 Document Reviewed: 12/03/2016 Elsevier Patient Education  2020 [place].   Heart Failure, Diagnosis  Heart failure means that your heart is not able to pump blood in the right way. This makes it hard for your body to work well. Heart failure is usually a long-term (chronic) condition. You must take good care of yourself and follow your treatment plan from your doctor. What are the causes? This condition may be caused by:  High blood pressure.  Build up of cholesterol and fat in the arteries.  Heart attack. This injures the heart muscle.  Heart valves that do not open and close properly.  Damage of the heart muscle. This is also called cardiomyopathy.  Lung disease.  Abnormal heart rhythms. What increases the risk? The risk of heart failure goes up as a person ages. This condition is also more likely to develop in people who:  Are overweight.  Are female.  Smoke or chew tobacco.  Abuse alcohol or illegal drugs.  Have taken medicines that can damage the heart.  Have diabetes.  Have abnormal heart rhythms.  Have thyroid problems.  Have low blood counts (anemia). What are the signs or symptoms? Symptoms of this condition include:  Shortness of breath.  Coughing.  Swelling of the feet, ankles, legs, or belly.  Losing weight for no reason.  Trouble breathing.  Waking from sleep because of the need to sit up and get more air.  Rapid heartbeat.  Being very tired.  Feeling dizzy, or feeling like you may pass out (faint).  Having no desire to eat.  Feeling like you may  vomit (nauseous).  Peeing (urinating) more at night.  Feeling confused. How is this treated?     This condition may be treated with:  Medicines. These can be given to treat blood pressure and to make the heart muscles stronger.  Changes in your daily life. These may include eating a healthy diet, staying at a healthy body weight, quitting tobacco and illegal drug use, or doing exercises.  Surgery. Surgery can be done to open blocked valves, or to put devices in the heart, such as pacemakers.  A  donor heart (heart transplant). You will receive a healthy heart from a donor. Follow these instructions at home:  Treat other conditions as told by your doctor. These may include high blood pressure, diabetes, thyroid disease, or abnormal heart rhythms.  Learn as much as you can about heart failure.  Get support as you need it.  Keep all follow-up visits as told by your doctor. This is important. Summary  Heart failure means that your heart is not able to pump blood in the right way.  This condition is caused by high blood pressure, heart attack, or damage of the heart muscle.  Symptoms of this condition include shortness of breath and swelling of the feet, ankles, legs, or belly. You may also feel very tired or feel like you may vomit.  You may be treated with medicines, surgery, or changes in your daily life.  Treat other health conditions as told by your doctor. This information is not intended to replace advice given to you by your health care provider. Make sure you discuss any questions you have with your health care provider. Document Revised: 02/09/2019 Document Reviewed: 02/09/2019 Elsevier Patient Education  [place].   Healthcare-Associated Pneumonia  Healthcare-associated pneumonia is a lung infection that a person can get when in a health care setting or during certain procedures. The infection causes air sacs inside the lungs to fill with pus or  fluid. Healthcare-associated pneumonia is usually caused by bacteria that are common in health care settings. These bacteria may be resistant to some antibiotic medicines. What are the causes? This condition is caused by bacteria that get into your lungs. You can get this condition if you:  Breathe in droplets from an infected person's cough or sneeze.  Touch something that an infected person coughed or sneezed on and then touch your mouth, nose, or eyes.  Have a bacterial infection somewhere else in your body, if the bacteria spread to your lungs through your blood. What increases the risk? This condition is more likely to develop in people who:  Have a disease that weakens their body's defense system (immune system) or their ability to cough out germs.  Are older than age 73.  Having trouble swallowing.  Use a feeding or breathing tube.  Have a cold or the flu.  Have an IV tube inserted in a vein.  Have surgery.  Have a bed sore.  Live in a long-term care facility, such as a nursing home.  Were in the hospital for two or more days in the past 3 months.  Received hemodialysis in the past 30 days. What are the signs or symptoms? Symptoms of this condition include:  Fever.  Chills.  Cough.  Shortness of breath.  Wheezing or crackling sounds when breathing. How is this diagnosed? This condition may be diagnosed based on:  Your symptoms.  A chest X-ray.  A measurement of the amount of oxygen in your blood. How is this treated? This condition is treated with antibiotics. Your health care provider may take a sample of cells (culture) from your throat to determine what type of bacteria is in your lungs and change your antibiotic based on the results. If you have bacteria in your blood, trouble breathing, or a low oxygen level, you may need to be treated at the hospital. At the hospital, you will be given antibiotics through an IV tube. You may also be given oxygen or  breathing treatments. Follow these instructions at home: Medicine  Take your antibiotic  medicine as told by your health care provider. Do not stop taking the antibiotic even if you start to feel better.  Take over-the-counter and other prescription medicines only as told by your health care provider. Activity  Rest at home until you feel better.  Return to your normal activities as told by your health care provider. Ask your health care provider what activities are safe for you. General instructions   Drink enough fluid to keep your urine clear or pale yellow.  Do not use any products that contain nicotine or tobacco, such as cigarettes and e-cigarettes. If you need help quitting, ask your health care provider.  Limit alcohol intake to no more than 1 drink per day for nonpregnant women and 2 drinks per day for men. One drink equals 12 oz of beer, 5 oz of wine, or 1 oz of hard liquor.  Keep all follow-up visits as told by your health care provider. This is important. How is this prevented? Actions that I can take To lower your risk of getting this condition again:  Do not smoke. This includes e-cigarettes.  Do not drink too much alcohol.  Keep your immune system healthy by eating well and getting enough sleep.  Get a flu shot every year (annually).  Get a pneumonia vaccination if: ? You are older than age 22. ? You smoke. ? You have a long-lasting condition like lung disease.  Exercise your lungs by taking deep breaths, walking, and using an incentive spirometer as directed.  Wash your hands often with soap and water. If you cannot get to a sink to wash your hands, use an alcohol-based hand cleaner.  Make sure your health care providers are washing their hands. If you do not see them wash their hands, ask them to do so.  When you are in a health care facility, avoid touching your eyes, nose, and mouth.  Avoid touching any surface near where people have coughed or  sneezed.  Stand away from sick people when they are coughing or sneezing.  Wear a mask if you cannot avoid exposure to people who are sick.  Clean all surfaces often with a disinfectant cleaner, especially if someone is sick at home or work.  Precautions of my health care team Hospitals, nursing homes, and other health care facilities take special care to try to prevent healthcare-associated pneumonia. To do this, your health care team may:  Clean their hands with soap and water or with alcohol-based hand sanitizer before and after seeing patients.  Wear gloves or masks during treatment.  Sanitize medical instruments, tubes, other equipment, and surfaces in patient rooms.  Raise (elevate) the head of your hospital bed so you are not lying flat. The head of the bed may be elevated 30 degrees or more.  Have you sit up and move around as soon as possible after surgery.  Only insert a breathing tube if needed.  Do these things for you if you have a breathing tube: ? Clean the inside of your mouth regularly. ? Remove the breathing tube as soon as it is no longer needed. Contact a health care provider if:  Your symptoms do not get better or they get worse.  Your symptoms come back after you have finished taking your antibiotics. Get help right away if:  You have trouble breathing.  You have confusion or difficulty thinking. This information is not intended to replace advice given to you by your health care provider. Make sure you discuss any questions  you have with your health care provider. Document Revised: 11/04/2017 Document Reviewed: 08/20/2016 Elsevier Patient Education  2020 [employer].

## 2020-09-05 NOTE — Plan of Care (Signed)

## 2020-09-05 NOTE — TOC Transition Note (Signed)
Transition of Care [facility]) - CM/SW Discharge Note   Patient Details  Name: Shawnita Krizek MRN: [id number] Date of Birth: 08-09-1961  Transition of [facility]) CM/SW Contact:  Amador Cunas, [place] Phone Number: 09/05/2020, 2:39 PM   Clinical Narrative: Pt for dc home with continued hospice services through [facility]. Pt and dtr requesting ambulance transport home. Spoke to [employer] with Lajean Manes [phone] who confirmed bipap will be delivered to pt's home today. Pt with home oxygen prior to admission. SW signing off at dc.   Wandra Feinstein, MSW, LCSW [phone] (coverage)       Final next level of care: Home w Hospice Care Barriers to Discharge: No Barriers Identified   Patient Goals and CMS Choice Patient states their goals for this hospitalization and ongoing recovery are:: "be comfortable" CMS Medicare.gov Compare Post Acute Care list provided to::  (pt has aready chosin [facility], who is in place)    Discharge Placement                  Name of family member notified: Megann/Dtr Patient and family notified of of transfer: 09/05/20  Discharge Plan and Services     Post Acute Care Choice: Hospice                      [facility] Agency:  ([place]) Date [place]: 09/02/20 Time [place]: 1030 Representative spoke with at [place]: [facility], Freddie Breech [phone]  Social Determinants of Health (Excel) Interventions     Readmission Risk Interventions No flowsheet data found.

## 2020-09-05 NOTE — Progress Notes (Signed)
Progress Note  Patient Name: Monica Ellis Date of Encounter: 09/05/2020  [facility] Cardiologist: Evalina Field, MD   Subjective   Breathing stable on 2-3L of oxygen. No chest discomfort.   Inpatient Medications    Scheduled Meds: . amLODipine  10 mg Oral Daily  . buPROPion  150 mg Oral Daily  . DULoxetine  60 mg Oral QHS  . enoxaparin (LOVENOX) injection  75 mg Subcutaneous Q24H  . insulin aspart  0-15 Units Subcutaneous TID WC  . insulin aspart  0-5 Units Subcutaneous QHS  . insulin aspart protamine- aspart  100 Units Subcutaneous TID AC  . lactulose  20 g Oral TID  . magnesium oxide  200 mg Oral QHS  . mycophenolate  720 mg Oral BID  . nadolol  80 mg Oral BID  . omega-3 acid ethyl esters  1 g Oral Daily  . potassium chloride  40 mEq Oral BID  . prednisoLONE  12.5 mg Oral Daily  . sodium chloride flush  3 mL Intravenous Q12H  . spironolactone  25 mg Oral Daily  . torsemide  40 mg Oral Daily   Continuous Infusions:  PRN Meds: ALPRAZolam, hyoscyamine, ipratropium-albuterol, ondansetron **OR** ondansetron (ZOFRAN) IV, sodium chloride flush, zolpidem   Vital Signs    Vitals:   09/04/20 2350 09/05/20 0351 09/05/20 0500 09/05/20 0820  BP:  (!) 136/49  (!) 159/90  Pulse: (!) 46 (!) 44  (!) 50  Resp: _0 Temp:  98.6 F (37 C)  99 F (37.2 C)  TempSrc:  Axillary    SpO2: 99% 98%  94%  Weight:   (!) 147 kg   Height:        Intake/Output Summary (Last 24 hours) at 09/05/2020 0933 Last data filed at 09/04/2020 2135 Gross per 24 hour  Intake 123 ml  Output 300 ml  Net -177 ml   Last 3 Weights 09/05/2020 09/04/2020 09/03/2020  Weight (lbs) 324 lb 1.2 oz 324 lb 4.8 oz 324 lb 4.8 oz  Weight (kg) 147 kg 147.1 kg 147.1 kg      Telemetry    SR, PACs rate of 40-60s - Personally Reviewed  ECG    SR with PAC- Personally Reviewed  Physical Exam   GEN: Obese female in no acute distress.   Neck: No JVD Cardiac:Irregular, no murmurs, rubs, or  gallops.  Respiratory: diminished breath sounds GI: Soft, nontender, non-distended  MS: No edema; No deformity. Neuro:  Nonfocal  Psych: Normal affect   Labs    High Sensitivity Troponin:   Recent Labs  Lab 08/28/20 0155 08/28/20 0647 08/28/20 0813  TROPONINIHS 16 18* 15      Chemistry Recent Labs  Lab 09/02/20 0327 09/02/20 0327 09/03/20 0534 09/04/20 0336 09/05/20 0433  NA 134*  --  134*  --  134*  K 3.5  --  3.8  --  4.0  CL 94*  --  96*  --  95*  CO2 29  --  29  --  33*  GLUCOSE 145*  --  130*  --  73  BUN 35*  --  36*  --  42*  CREATININE 0.95   < > 0.93 1.13* 1.05*  CALCIUM 8.4*  --  8.6*  --  9.4  PROT  --   --   --  9.7* 9.7*  ALBUMIN  --   --   --  3.0* 3.1*  AST  --   --   --  43* 44*  ALT  --   --   --  92* 94*  ALKPHOS  --   --   --  38 36*  BILITOT  --   --   --  1.1 0.7  GFRNONAA >60   < > >60 52* 57*  GFRAA >60   < > >60 >60 >60  ANIONGAP 11  --  9  --  6   < > = values in this interval not displayed.     Hematology Recent Labs  Lab 09/02/20 0327 09/03/20 0534 09/05/20 0433  WBC 15.6* 15.3* 15.6*  RBC 3.64* 3.88 4.43  HGB 11.7* 12.5 13.7  HCT 35.6* 38.2 42.7  MCV 97.8 98.5 96.4  MCH 32.1 32.2 30.9  MCHC 32.9 32.7 32.1  RDW 14.3 14.5 13.9  PLT 281 303 317     Radiology    No results found.  Cardiac Studies   Cath 09/01/20 Findings:  RA = 15 RV = 80/18 PA = 80/35 (51) PCW = 26 Fick cardiac output/index = 7.2/2.9 PVR = 3.5 WU FA sat = 93% PA sat = 64%, 66% SVC sat = 60%  Assessment: 1. 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 Moderately elevated left-sided filling pressures 3. High cardiac output with PVR 3.5 WU (given patient's body habitus this may be overestimated by Fick equation. Thermodilution not available on 5FR catheter) 4. 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 Continue diuresis and BIPAP. Will need weight loss.  Echo 09/02/20 1. Limited  images- technically difficult, Definity not given  2. LV E' velocity is low at 5-6 cm/sec. Left ventricular ejection  fraction, by estimation, is 55 to 60%. The left ventricle has normal  function. The left ventricle has no regional wall motion abnormalities.  There is moderate left ventricular  hypertrophy. Left ventricular diastolic parameters are consistent with  Grade II diastolic dysfunction (pseudonormalization). Elevated left  ventricular end-diastolic pressure. The E/e' is 21.  3. Right ventricular systolic function is low normal. The right  ventricular size is mildly enlarged. Tricuspid regurgitation signal is  inadequate for assessing PA pressure.  4. The mitral valve was not well visualized. No evidence of mitral valve  regurgitation.  5. The aortic valve was not well visualized. Aortic valve regurgitation  is not visualized.  6. The inferior vena cava is normal in size with <50% respiratory  variability, suggesting right atrial pressure of 8 mmHg.   Patient Profile     59 y.o.femalewith a hx of autoimmune hepatitis/lupus on mycophenolate/prednisone, morbid obesity, DM type 2, tobacco abuse,suspectedobesity hypoventilation syndromewith recommended use of Trilogy ventilator at night,longstanding tobacco use (40 years),priorbrain tumor, GERDpresented for shortness of breath and admitted for acute on chronic hypoxic respiratory failure.  Assessment & Plan    1. Acute on chronichypoxic respiratory failure 2.Moderate to severe pulmonary hypertension 3. Chronic diastolic CHF 4. OSA and OHS  RHC showed elevated wedge pressure.  Echo was difficult study showed LVEF of 55-60%, grade II DD, low normal RV function. Symptoms seems multifactorial.  Excellent diuresis with improved symptoms on IV lasix 17m BID however accurate I & O recorded. Continue Torsemide.   [facility] will sign off.   Medication Recommendations:  Continue Amlodipine 141mqd, nadolol 8066mBID, Kdur 40 meq BID, Spironolactone 92m82m and Torsemide 40mg92m(advised to take additional 20mg 52mfor severe shortness of breath or weight gain) Other recommendations (labs, testing, etc):  BMET in 7-10  days Follow up as an outpatient:  Has been arranged   For questions or updates, please contact [place] Please consult [email or web address] for contact info under        SignedLeanor Kail, PA  09/05/2020, 9:33 AM

## 2020-09-08 LAB — RNA QUALITATIVE

## 2020-09-23 NOTE — Progress Notes (Deleted)
Cardiology Office Note:   Date:  09/23/2020  NAME:  Monica Ellis    MRN: [id number] DOB:  28-Aug-1961   PCP:  Ferd Hibbs, NP  Cardiologist:  Evalina Field, MD  Electrophysiologist:  None   Referring MD: Ferd Hibbs, NP   No chief complaint on file. ***  History of Present Illness:   Monica Ellis is a 59 y.o. female with a hx of HFpEF, COPD, autoimmune hepatitis, cirrhosis, obesity, OHS, COPD who is being seen today for the evaluation of HFpEF at the request of Ferd Hibbs, NP. Recent admission for respiratory failure 2/2 acute HFpEF.   Problem List 1. HFpEF 2. Moderate to severe pHTN -2/2 HFpEF/lung dz -CT PE negative -negative V/Q  3. OHS/COPD -home 2L 4. Autoimmune hepatitis/Cirrhosis 5. Lupus 6. Diabetes -A1c 6.2 7. Obesity 8. HLD -T chol 170, HDL 48, LDL 87, TG 293  Past Medical History: Past Medical History:  Diagnosis Date  . 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 Brain tumor ([place])    hx of  . Diabetes mellitus without complication ([place])   . GERD (gastroesophageal reflux disease)   . Hypertension   . Lupus ([place])   . Morbid obesity ([place])   . Obesity hypoventilation syndrome ([place])   . Pneumonia   . Tobacco use disorder     Past Surgical History: Past Surgical History:  Procedure Laterality Date  . CHOLECYSTECTOMY    . DILATION AND CURETTAGE OF UTERUS    . EYE SURGERY Bilateral    laser surgery   . IR TRANSCATHETER BX  11/03/2018  . IR US GUIDE VASC ACCESS RIGHT  11/03/2018  . IR VENOGRAM HEPATIC W HEMODYNAMIC EVALUATION  11/03/2018  . RIGHT HEART CATH N/A 09/01/2020   Procedure: RIGHT HEART CATH;  Surgeon: Jolaine Artist, MD;  Location: [facility];  Service: Cardiovascular;  Laterality: N/A;    Current Medications: No outpatient medications have been marked as taking for the 09/25/20 encounter (Appointment) with Geralynn Rile, MD.     Allergies:    Ace inhibitors,  Imuran [azathioprine], Lisinopril, Protonix [pantoprazole], Tramadol, Iodinated diagnostic agents, Iodine, Janumet [sitagliptin-metformin hcl], Omeprazole, and Victoza [liraglutide]   Social History: Social History   Socioeconomic History  . Marital status: Single    Spouse name: Not on file  . Number of children: 2  . Years of education: Not on file  . Highest education level: Bachelor's degree (e.g., BA, AB, BS)  Occupational History    Comment: disabled  Tobacco Use  . Smoking status: Current Every Day Smoker    Packs/day: 1.00    Years: 30.00    Pack years: 30.00    Types: Cigarettes  . Smokeless tobacco: Never Used  Vaping Use  . Vaping Use: Never used  Substance and Sexual Activity  . Alcohol use: No  . Drug use: No  . Sexual activity: Not on file  Other Topics Concern  . Not on file  Social History Narrative   Lives with mother   Caffeine- Diet Coke, 1 Liter   Social Determinants of Health   Financial Resource Strain:   . Difficulty of Paying Living Expenses: Not on file  Food Insecurity:   . Worried About Charity fundraiser in the Last Year: Not on file  . Ran Out of Food in the Last Year: Not on file  Transportation Needs:   . Lack of Transportation (Medical): Not on file  . Lack of  Transportation (Non-Medical): Not on file  Physical Activity:   . Days of Exercise per Week: Not on file  . Minutes of Exercise per Session: Not on file  Stress:   . Feeling of Stress : Not on file  Social Connections:   . Frequency of Communication with Friends and Family: Not on file  . Frequency of Social Gatherings with Friends and Family: Not on file  . Attends Religious Services: Not on file  . Active Member of Clubs or Organizations: Not on file  . Attends Archivist Meetings: Not on file  . Marital Status: Not on file     Family History: The patient's ***family history includes Cancer in her maternal uncle; Diabetes in her father; Hypertension in her  father and mother; Kidney cancer in her maternal uncle.  ROS:   All other ROS reviewed and negative. Pertinent positives noted in the HPI.     EKGs/Labs/Other Studies Reviewed:   The following studies were personally reviewed by me today:  EKG:  EKG is *** ordered today.  The ekg ordered today demonstrates ***, and was personally reviewed by me.   TTE 09/02/2020 1. Limited images- technically difficult, Definity not given  2. LV E' velocity is low at 5-6 cm/sec. Left ventricular ejection  fraction, by estimation, is 55 to 60%. The left ventricle has normal  function. The left ventricle has no regional wall motion abnormalities.  There is moderate left ventricular  hypertrophy. Left ventricular diastolic parameters are consistent with  Grade II diastolic dysfunction (pseudonormalization). Elevated left  ventricular end-diastolic pressure. The E/e' is 21.  3. Right ventricular systolic function is low normal. The right  ventricular size is mildly enlarged. Tricuspid regurgitation signal is  inadequate for assessing PA pressure.  4. The mitral valve was not well visualized. No evidence of mitral valve  regurgitation.  5. The aortic valve was not well visualized. Aortic valve regurgitation  is not visualized.  6. The inferior vena cava is normal in size with <50% respiratory  variability, suggesting right atrial pressure of 8 mmHg.   Right Heart Cath 09/01/2020 RA = 15 RV = 80/18 PA = 80/35 (51) PCW = 26 Fick cardiac output/index = 7.2/2.9 PVR = 3.5 WU FA sat = 93% PA sat = 64%, 66% SVC sat = 60%  Assessment: 1. 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 Moderately elevated left-sided filling pressures 3. High cardiac output with PVR 3.5 WU (given patient's body habitus this may be overestimated by Fick equation. Thermodilution not available on 5FR catheter) 4. No evidence of intra-cardiac shunting   Recent Labs: 08/28/2020: B Natriuretic Peptide 167.7; TSH  0.736 09/03/2020: Magnesium 2.1 09/05/2020: ALT 94; BUN 42; Creatinine, Ser 1.05; Hemoglobin 13.7; Platelets 317; Potassium 4.0; Sodium 134   Recent Lipid Panel    Component Value Date/Time   CHOL 170 11/12/2019 1429   TRIG 293 (H) 11/12/2019 1429   HDL 48 (L) 11/12/2019 1429   CHOLHDL 3.5 11/12/2019 1429   LDLCALC 87 11/12/2019 1429    Physical Exam:   VS:  There were no vitals taken for this visit.   Wt Readings from Last 3 Encounters:  09/05/20 (!) 324 lb 1.2 oz (147 kg)  06/17/20 (!) 326 lb 14.4 oz (148.3 kg)  10/16/19 (!) 330 lb (149.7 kg)    General: Well nourished, well developed, in no acute distress Heart: Atraumatic, normal size  Eyes: PEERLA, EOMI  Neck: Supple, no JVD Endocrine: No thryomegaly Cardiac: Normal S1, S2; RRR; no  murmurs, rubs, or gallops Lungs: Clear to auscultation bilaterally, no wheezing, rhonchi or rales  Abd: Soft, nontender, no hepatomegaly  Ext: No edema, pulses 2+ Musculoskeletal: No deformities, BUE and BLE strength normal and equal Skin: Warm and dry, no rashes   Neuro: Alert and oriented to person, place, time, and situation, CNII-XII grossly intact, no focal deficits  Psych: Normal mood and affect   ASSESSMENT:   Monica Ellis is a 59 y.o. female who presents for the following: No diagnosis found.  PLAN:   There are no diagnoses linked to this encounter.  Disposition: No follow-ups on file.  Medication Adjustments/Labs and Tests Ordered: Current medicines are reviewed at length with the patient today.  Concerns regarding medicines are outlined above.  No orders of the defined types were placed in this encounter.  No orders of the defined types were placed in this encounter.   There are no Patient Instructions on file for this visit.   Time Spent with Patient: I have spent a total of *** minutes with patient reviewing hospital notes, telemetry, EKGs, labs and examining the patient as well as establishing an assessment and  plan that was discussed with the patient.  > 50% of time was spent in direct patient care.  Signed, Addison Naegeli. Audie Box, [place]  [address], [place], [place] [address] [phone]  09/23/2020 12:37 PM

## 2020-09-25 ENCOUNTER — Ambulatory Visit: Payer: Medicare Other | Admitting: Cardiovascular Disease

## 2020-09-25 DIAGNOSIS — E782 Mixed hyperlipidemia: Secondary | ICD-10-CM

## 2020-09-25 DIAGNOSIS — I272 Pulmonary hypertension, unspecified: Secondary | ICD-10-CM

## 2020-09-25 DIAGNOSIS — I5032 Chronic diastolic (congestive) heart failure: Secondary | ICD-10-CM

## 2020-09-25 DIAGNOSIS — I10 Essential (primary) hypertension: Secondary | ICD-10-CM

## 2020-10-02 ENCOUNTER — Inpatient Hospital Stay: Payer: Medicare Other | Admitting: Internal Medicine

## 2021-05-29 ENCOUNTER — Emergency Department: Payer: Medicare Other

## 2021-05-29 ENCOUNTER — Inpatient Hospital Stay
Admission: EM | Admit: 2021-05-29 | Discharge: 2021-05-31 | DRG: 291 | Disposition: A | Discharge status: Home / Self Care | Payer: Medicare Other | Attending: Internal Medicine | Admitting: Internal Medicine

## 2021-05-29 DIAGNOSIS — R0602 Shortness of breath: Secondary | ICD-10-CM | POA: Diagnosis present

## 2021-05-29 DIAGNOSIS — Z8616 Personal history of COVID-19: Secondary | ICD-10-CM | POA: Diagnosis not present

## 2021-05-29 DIAGNOSIS — Z8051 Family history of malignant neoplasm of kidney: Secondary | ICD-10-CM

## 2021-05-29 DIAGNOSIS — Z794 Long term (current) use of insulin: Secondary | ICD-10-CM | POA: Diagnosis not present

## 2021-05-29 DIAGNOSIS — R609 Edema, unspecified: Secondary | ICD-10-CM

## 2021-05-29 DIAGNOSIS — E119 Type 2 diabetes mellitus without complications: Secondary | ICD-10-CM

## 2021-05-29 DIAGNOSIS — J449 Chronic obstructive pulmonary disease, unspecified: Secondary | ICD-10-CM

## 2021-05-29 DIAGNOSIS — M3214 Glomerular disease in systemic lupus erythematosus: Secondary | ICD-10-CM | POA: Diagnosis present

## 2021-05-29 DIAGNOSIS — Z9981 Dependence on supplemental oxygen: Secondary | ICD-10-CM

## 2021-05-29 DIAGNOSIS — J42 Unspecified chronic bronchitis: Secondary | ICD-10-CM | POA: Diagnosis present

## 2021-05-29 DIAGNOSIS — E662 Morbid (severe) obesity with alveolar hypoventilation: Secondary | ICD-10-CM | POA: Diagnosis present

## 2021-05-29 DIAGNOSIS — R7989 Other specified abnormal findings of blood chemistry: Secondary | ICD-10-CM | POA: Diagnosis present

## 2021-05-29 DIAGNOSIS — G8929 Other chronic pain: Secondary | ICD-10-CM | POA: Diagnosis present

## 2021-05-29 DIAGNOSIS — Z20822 Contact with and (suspected) exposure to covid-19: Secondary | ICD-10-CM | POA: Diagnosis present

## 2021-05-29 DIAGNOSIS — Z79899 Other long term (current) drug therapy: Secondary | ICD-10-CM | POA: Diagnosis not present

## 2021-05-29 DIAGNOSIS — Z8701 Personal history of pneumonia (recurrent): Secondary | ICD-10-CM

## 2021-05-29 DIAGNOSIS — M329 Systemic lupus erythematosus, unspecified: Secondary | ICD-10-CM | POA: Diagnosis present

## 2021-05-29 DIAGNOSIS — J9621 Acute and chronic respiratory failure with hypoxia: Secondary | ICD-10-CM | POA: Diagnosis present

## 2021-05-29 DIAGNOSIS — K219 Gastro-esophageal reflux disease without esophagitis: Secondary | ICD-10-CM | POA: Diagnosis present

## 2021-05-29 DIAGNOSIS — Z66 Do not resuscitate: Secondary | ICD-10-CM | POA: Diagnosis present

## 2021-05-29 DIAGNOSIS — I5033 Acute on chronic diastolic (congestive) heart failure: Secondary | ICD-10-CM | POA: Diagnosis present

## 2021-05-29 DIAGNOSIS — K7460 Unspecified cirrhosis of liver: Secondary | ICD-10-CM | POA: Diagnosis present

## 2021-05-29 DIAGNOSIS — Z888 Allergy status to other drugs, medicaments and biological substances status: Secondary | ICD-10-CM

## 2021-05-29 DIAGNOSIS — F17210 Nicotine dependence, cigarettes, uncomplicated: Secondary | ICD-10-CM | POA: Diagnosis present

## 2021-05-29 DIAGNOSIS — I2722 Pulmonary hypertension due to left heart disease: Secondary | ICD-10-CM | POA: Diagnosis present

## 2021-05-29 DIAGNOSIS — Z8249 Family history of ischemic heart disease and other diseases of the circulatory system: Secondary | ICD-10-CM

## 2021-05-29 DIAGNOSIS — Z7952 Long term (current) use of systemic steroids: Secondary | ICD-10-CM

## 2021-05-29 DIAGNOSIS — Z833 Family history of diabetes mellitus: Secondary | ICD-10-CM | POA: Diagnosis not present

## 2021-05-29 DIAGNOSIS — Z9049 Acquired absence of other specified parts of digestive tract: Secondary | ICD-10-CM

## 2021-05-29 DIAGNOSIS — Z6841 Body Mass Index (BMI) 40.0 and over, adult: Secondary | ICD-10-CM | POA: Diagnosis not present

## 2021-05-29 DIAGNOSIS — I2723 Pulmonary hypertension due to lung diseases and hypoxia: Secondary | ICD-10-CM | POA: Diagnosis present

## 2021-05-29 DIAGNOSIS — I110 Hypertensive heart disease with heart failure: Secondary | ICD-10-CM | POA: Diagnosis present

## 2021-05-29 DIAGNOSIS — K754 Autoimmune hepatitis: Secondary | ICD-10-CM | POA: Diagnosis present

## 2021-05-29 DIAGNOSIS — I509 Heart failure, unspecified: Secondary | ICD-10-CM

## 2021-05-29 LAB — COMPREHENSIVE METABOLIC PANEL
ALT: 58 U/L — ABNORMAL HIGH (ref 0–44)
AST: 36 U/L (ref 15–41)
Albumin: 3.4 g/dL — ABNORMAL LOW (ref 3.5–5.0)
Alkaline Phosphatase: 40 U/L (ref 38–126)
Anion gap: 3 — ABNORMAL LOW (ref 5–15)
BUN: 24 mg/dL — ABNORMAL HIGH (ref 8–23)
CO2: 30 mmol/L (ref 22–32)
Calcium: 9 mg/dL (ref 8.9–10.3)
Chloride: 103 mmol/L (ref 98–111)
Creatinine, Ser: 0.72 mg/dL (ref 0.44–1.00)
GFR, Estimated: >60 mL/min (ref >60)
Glucose, Bld: 108 mg/dL — ABNORMAL HIGH (ref 70–99)
Potassium: 4.1 mmol/L (ref 3.5–5.1)
Sodium: 136 mmol/L (ref 135–145)
Total Bilirubin: 0.9 mg/dL (ref 0.3–1.2)
Total Protein: 9.4 g/dL — ABNORMAL HIGH (ref 6.5–8.1)

## 2021-05-29 LAB — CBC WITH DIFFERENTIAL/PLATELET
Abs Immature Granulocytes: 0.1 10*3/uL — ABNORMAL HIGH (ref 0.00–0.07)
Basophils Absolute: 0 10*3/uL (ref 0.0–0.1)
Basophils Relative: 1 %
Eosinophils Absolute: 0.1 10*3/uL (ref 0.0–0.5)
Eosinophils Relative: 1 %
HCT: 34.9 % — ABNORMAL LOW (ref 36.0–46.0)
Hemoglobin: 11.5 g/dL — ABNORMAL LOW (ref 12.0–15.0)
Immature Granulocytes: 1 %
Lymphocytes Relative: 18 %
Lymphs Abs: 1.6 10*3/uL (ref 0.7–4.0)
MCH: 31 pg (ref 26.0–34.0)
MCHC: 33 g/dL (ref 30.0–36.0)
MCV: 94.1 fL (ref 80.0–100.0)
Monocytes Absolute: 0.9 10*3/uL (ref 0.1–1.0)
Monocytes Relative: 10 %
Neutro Abs: 6.2 10*3/uL (ref 1.7–7.7)
Neutrophils Relative %: 69 %
Platelets: 201 10*3/uL (ref 150–400)
RBC: 3.71 MIL/uL — ABNORMAL LOW (ref 3.87–5.11)
RDW: 15.5 % (ref 11.5–15.5)
WBC: 8.8 10*3/uL (ref 4.0–10.5)
nRBC: 0.2 % (ref 0.0–0.2)

## 2021-05-29 LAB — TROPONIN I (HIGH SENSITIVITY): Troponin I (High Sensitivity): 8 ng/L (ref <18)

## 2021-05-29 LAB — CBG MONITORING, ED: Glucose-Capillary: 194 mg/dL — ABNORMAL HIGH (ref 70–99)

## 2021-05-29 LAB — RESP PANEL BY RT-PCR (FLU A&B, COVID) ARPGX2
Influenza A by PCR: NEGATIVE
Influenza B by PCR: NEGATIVE
SARS Coronavirus 2 by RT PCR: NEGATIVE

## 2021-05-29 LAB — BRAIN NATRIURETIC PEPTIDE: B Natriuretic Peptide: 122.2 pg/mL — ABNORMAL HIGH (ref 0.0–100.0)

## 2021-05-29 LAB — PROCALCITONIN: Procalcitonin: <0.1 ng/mL

## 2021-05-29 MED ORDER — ACETAMINOPHEN 650 MG RE SUPP: 650.0000 mg | Freq: Four times a day (QID) | RECTAL | Status: DC | PRN

## 2021-05-29 MED ORDER — ENOXAPARIN SODIUM 80 MG/0.8ML IJ SOSY
0.5000 mg/kg | PREFILLED_SYRINGE | INTRAMUSCULAR | Status: DC
  Administered 2021-05-29 – 2021-05-30 (×2): 72.5 mg via SUBCUTANEOUS
  Filled 2021-05-29 (×2): qty 0.72
  Filled 2021-05-29: qty 0.8

## 2021-05-29 MED ORDER — ONDANSETRON HCL 4 MG/2ML IJ SOLN: 4.0000 mg | Freq: Four times a day (QID) | INTRAMUSCULAR | Status: DC | PRN

## 2021-05-29 MED ORDER — INSULIN ASPART 100 UNIT/ML IJ SOLN
0.0000 [IU] | Freq: Every day | INTRAMUSCULAR | Status: DC
  Filled 2021-05-29: qty 1

## 2021-05-29 MED ORDER — HYDROCODONE-ACETAMINOPHEN 5-325 MG PO TABS
1.0000 | ORAL_TABLET | ORAL | Status: DC | PRN
  Administered 2021-05-29 – 2021-05-31 (×6): 2 via ORAL
  Filled 2021-05-29 (×6): qty 2

## 2021-05-29 MED ORDER — ONDANSETRON HCL 4 MG PO TABS
4.0000 mg | ORAL_TABLET | Freq: Four times a day (QID) | ORAL | Status: DC | PRN
  Administered 2021-05-29 – 2021-05-31 (×3): 4 mg via ORAL
  Filled 2021-05-29 (×3): qty 1

## 2021-05-29 MED ORDER — ENOXAPARIN SODIUM 40 MG/0.4ML IJ SOSY: 40.0000 mg | PREFILLED_SYRINGE | INTRAMUSCULAR | Status: DC

## 2021-05-29 MED ORDER — SPIRONOLACTONE 25 MG PO TABS
25.0000 mg | ORAL_TABLET | Freq: Every day | ORAL | Status: DC
  Administered 2021-05-29 – 2021-05-31 (×3): 25 mg via ORAL
  Filled 2021-05-29 (×3): qty 1

## 2021-05-29 MED ORDER — NADOLOL 40 MG PO TABS
80.0000 mg | ORAL_TABLET | Freq: Two times a day (BID) | ORAL | Status: DC
  Administered 2021-05-29 – 2021-05-31 (×4): 80 mg via ORAL
  Filled 2021-05-29 (×5): qty 2

## 2021-05-29 MED ORDER — FUROSEMIDE 10 MG/ML IJ SOLN
40.0000 mg | Freq: Two times a day (BID) | INTRAMUSCULAR | Status: DC
  Administered 2021-05-30 – 2021-05-31 (×3): 40 mg via INTRAVENOUS
  Filled 2021-05-29 (×3): qty 4

## 2021-05-29 MED ORDER — FUROSEMIDE 10 MG/ML IJ SOLN
60.0000 mg | Freq: Once | INTRAMUSCULAR | Status: AC
  Administered 2021-05-29: 60 mg via INTRAVENOUS
  Filled 2021-05-29: qty 8

## 2021-05-29 MED ORDER — INSULIN ASPART 100 UNIT/ML IJ SOLN
0.0000 [IU] | Freq: Three times a day (TID) | INTRAMUSCULAR | Status: DC
  Administered 2021-05-30: 3 [IU] via SUBCUTANEOUS
  Administered 2021-05-30: 4 [IU] via SUBCUTANEOUS
  Administered 2021-05-31: 3 [IU] via SUBCUTANEOUS
  Filled 2021-05-29 (×3): qty 1

## 2021-05-29 MED ORDER — ACETAMINOPHEN 325 MG PO TABS
650.0000 mg | ORAL_TABLET | Freq: Four times a day (QID) | ORAL | Status: DC | PRN
  Administered 2021-05-30 – 2021-05-31 (×3): 650 mg via ORAL
  Filled 2021-05-29 (×3): qty 2

## 2021-05-29 NOTE — H&P (Addendum)
History and Physical    Monica Ellis BEM:[id number] DOB: 06-Apr-1961 DOA: 05/29/2021  PCP: Ferd Hibbs, NP   Patient coming from: Home  I have personally briefly reviewed patient's old medical records in [place]  Chief Complaint: Shortness of breath  HPI: Monica Ellis is a 60 y.o. female with medical history significant for SLE, autoimmune hepatitis on prednisone, liver cirrhosis, morbid obesity with BMI of 53, type 2 diabetes and tobacco use, COPD, moderate to severe pulmonary hypertension on right heart cath 0/7121, diastolic CHF with EF 55 to 60%, 08/2020, and suspected OSA/OHS, on home O2 at 3 L and discharged with home trilogy in July 2021, who presents to the ED with a 1 week history of shortness of breath, dyspnea on exertion and a cough productive of white cloudy phlegm.  She was prescribed antibiotics by her PCP 3 days prior but symptoms have not improved.  Patient has chronic left lower extremity edema that has been extensively worked up and reports no recent worsening of pain in the lower extremities.  She denies orthopnea, chest pain,  fever or chills.  Denies abdominal pain, diarrhea or nausea and vomiting.  States she has had multiple admissions for pneumonia in the past.  EMS reported an O2 sat of 80% on her home flow rate of 3 L and she was transported on CPAP ED course: On arrival, afebrile with BP 131/103 with pulse of 60 respirations 20 O2 sat 95% on CPAP.  She was transitioned to BiPAP.  Blood work with troponin of 8 and BNP 122 but otherwise unremarkable.  WBC normal hemoglobin 11.5.  Procalcitonin pending.  COVID and flu negative. EKG, personally viewed and interpreted with normal sinus rhythm at 63 with no acute ST-T wave changes Imaging: Chest x-ray with stable cardiomegaly with mild to moderate severity bibasilar atelectasis and/or/infiltrate  Patient treated with Lasix for suspicion of CHF.  Hospitalist consulted for admission.   Review of  Systems: As per HPI otherwise all other systems on review of systems negative.    Past Medical History:  Diagnosis Date   Autoimmune hepatitis ([place])    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   Brain tumor ([place])    hx of   Diabetes mellitus without complication (HCC)    GERD (gastroesophageal reflux disease)    Hypertension    Lupus ([place])    Morbid obesity ([place])    Obesity hypoventilation syndrome ([place])    Pneumonia    Tobacco use disorder     Past Surgical History:  Procedure Laterality Date   CHOLECYSTECTOMY     DILATION AND CURETTAGE OF UTERUS     EYE SURGERY Bilateral    laser surgery    IR TRANSCATHETER BX  11/03/2018   IR US GUIDE VASC ACCESS RIGHT  11/03/2018   IR VENOGRAM HEPATIC W HEMODYNAMIC EVALUATION  11/03/2018   RIGHT HEART CATH N/A 09/01/2020   Procedure: RIGHT HEART CATH;  Surgeon: Jolaine Artist, MD;  Location: [facility];  Service: Cardiovascular;  Laterality: N/A;     reports that she has been smoking cigarettes. She has a 30.00 pack-year smoking history. She has never used smokeless tobacco. She reports that she does not drink alcohol and does not use drugs.  Allergies  Allergen Reactions   Ace Inhibitors Swelling   Imuran [Azathioprine] Nausea And Vomiting    Severe N/V/D and AKI after starting the med.   Lisinopril Swelling and Other (See Comments)    Other reaction(s): Angioedema (ALLERGY/intolerance), Face  Protonix [Pantoprazole] Swelling    Pt states that it causes her lips to swell.    Tramadol Other (See Comments)    Other reaction(s): Mental Status Changes (intolerance)   Iodinated Diagnostic Agents Swelling    When patient was in her 20's, swelled up all over after getting getting injection of contrast; did not have any other symptoms/ was given benadryl after that happened and had no further problems per patients/   Iodine Swelling   Janumet [Sitagliptin-Metformin Hcl] Other (See Comments)    Continuous yeast infection   Omeprazole  Swelling    Lips will swell   Victoza [Liraglutide] Nausea And Vomiting    Family History  Problem Relation Age of Onset   Diabetes Father    Hypertension Father    Hypertension Mother    Kidney cancer Maternal Uncle    Cancer Maternal Uncle        brain      Prior to Admission medications   Medication Sig Start Date End Date Taking? Authorizing Provider  amLODipine (NORVASC) 10 MG tablet Take 10 mg by mouth daily. 08/04/20   [provider]  ascorbic acid (VITAMIN C) 500 MG tablet Take 2,000 mg by mouth daily.    [provider]  buPROPion (WELLBUTRIN XL) 150 MG 24 hr tablet Take 150 mg by mouth daily. 05/21/20   [provider]  Cholecalciferol (VITAMIN D PO) Take 1 tablet by mouth every evening.     [provider]  DULoxetine (CYMBALTA) 60 MG capsule Take 60 mg by mouth at bedtime.  08/26/16   [provider]  fexofenadine-pseudoephedrine (ALLEGRA-D 24) 180-240 MG 24 hr tablet Take 1 tablet by mouth daily.    [provider]  insulin lispro protamine-lispro (HUMALOG 75/25 MIX) (75-25) 100 UNIT/ML SUSP injection Inject 120 Units into the skin 3 (three) times daily with meals. 06/17/20   Domenic Polite, MD  lactulose (CHRONULAC) 10 GM/15ML solution Take 30 mLs by mouth 3 (three) times daily as needed for constipation. 11/22/19   [provider]  LEVSIN 0.125 MG tablet Take 0.125 mg by mouth every 4 (four) hours as needed for spasms. 08/08/20   [provider]  LORazepam (ATIVAN) 0.5 MG tablet Take 0.5 mg by mouth 3 (three) times daily as needed for anxiety. 08/27/20   [provider]  magnesium 30 MG tablet Take 30 mg by mouth at bedtime.     [provider]  Multiple Vitamin (MULTIVITAMIN) tablet Take 1 tablet by mouth daily.     [provider]  mycophenolate (MYFORTIC) 360 MG TBEC EC tablet Take 720 mg by mouth 2 (two) times daily. 05/13/20   [provider]  nadolol (CORGARD) 80 MG  tablet Take 80 mg by mouth 2 (two) times daily. 07/06/20   [provider]  NASAL SALINE NA Place 1 spray into the nose daily as needed (Congestion and Allergies).    [provider]  Omega-3 Fatty Acids (FISH OIL PO) Take 1 capsule by mouth every evening.     [provider]  oxyCODONE (OXYCONTIN) 10 mg 12 hr tablet Take 10 mg by mouth every 6 (six) hours as needed for pain.    [provider]  potassium chloride SA (KLOR-CON) 20 MEQ tablet Take 2 tablets (40 mEq total) by mouth daily. 09/05/20 11/04/20  Terrilee Croak, MD  predniSONE (DELTASONE) 5 MG tablet Take 12.5 mg by mouth daily with breakfast.     [provider]  spironolactone (ALDACTONE) 25  MG tablet Take 1 tablet (25 mg total) by mouth daily. 06/18/20   Domenic Polite, MD  torsemide (DEMADEX) 20 MG tablet Take 2 tablets (40 mg total) by mouth daily. 09/06/20 11/05/20  Terrilee Croak, MD  TURMERIC PO Take 400 mg by mouth daily.     [provider]  zolpidem (AMBIEN) 10 MG tablet Take 10 mg by mouth at bedtime. 05/15/19   [provider]    Physical Exam: Vitals:   05/29/21 1815 05/29/21 1830 05/29/21 1845 05/29/21 1900  BP:  124/70    Pulse: (!) 55 (!) 54 63 (!) 57  Resp: 15 (!) _0 Temp:      TempSrc:      SpO2: 91% 92% 94% 91%  Weight:      Height:         Vitals:   05/29/21 1815 05/29/21 1830 05/29/21 1845 05/29/21 1900  BP:  124/70    Pulse: (!) 55 (!) 54 63 (!) 57  Resp: 15 (!) _1 Temp:      TempSrc:      SpO2: 91% 92% 94% 91%  Weight:      Height:          Constitutional: Alert and oriented x 3 .  Conversational dyspnea HEENT:      Head: Normocephalic and atraumatic.         Eyes: PERLA, EOMI, Conjunctivae are normal. Sclera is non-icteric.       Mouth/Throat: Mucous membranes are moist.       Neck: Supple with no signs of meningismus. Cardiovascular: Regular rate and rhythm. No murmurs, gallops, or rubs. 2+ symmetrical distal pulses are  present . No JVD.  Nonpitting 2+ left LE edema Respiratory: Respiratory effort increased.Lungs sounds diminished bilaterally. No wheezes, crackles, or rhonchi.  Gastrointestinal: Soft, non tender, and non distended with positive bowel sounds.  Genitourinary: No CVA tenderness. Musculoskeletal: Nontender with normal range of motion in all extremities. No cyanosis, or erythema of extremities. Neurologic:  Face is symmetric. Moving all extremities. No gross focal neurologic deficits . Skin: Skin is warm, dry.  No rash or ulcers Psychiatric: Mood and affect are normal    Labs on Admission: I have personally reviewed following labs and imaging studies  CBC: Recent Labs  Lab 05/29/21 1722  WBC 8.8  NEUTROABS 6.2  HGB 11.5*  HCT 34.9*  MCV 94.1  PLT 735   Basic Metabolic Panel: Recent Labs  Lab 05/29/21 1722  NA 136  K 4.1  CL 103  CO2 30  GLUCOSE 108*  BUN 24*  CREATININE 0.72  CALCIUM 9.0   GFR: Estimated Creatinine Clearance: 107 mL/min (by C-G formula based on SCr of 0.72 mg/dL). Liver Function Tests: Recent Labs  Lab 05/29/21 1722  AST 36  ALT 58*  ALKPHOS 40  BILITOT 0.9  PROT 9.4*  ALBUMIN 3.4*   No results for input(s): LIPASE, AMYLASE in the last 168 hours. No results for input(s): AMMONIA in the last 168 hours. Coagulation Profile: No results for input(s): INR, PROTIME in the last 168 hours. Cardiac Enzymes: No results for input(s): CKTOTAL, CKMB, CKMBINDEX, TROPONINI in the last 168 hours. BNP (last 3 results) No results for input(s): PROBNP in the last 8760 hours. HbA1C: No results for input(s): HGBA1C in the last 72 hours. CBG: No results for input(s): GLUCAP in the last 168 hours. Lipid Profile: No results for input(s): CHOL, HDL, LDLCALC, TRIG, CHOLHDL, LDLDIRECT in the last 72 hours. Thyroid  Function Tests: No results for input(s): TSH, T4TOTAL, FREET4, T3FREE, THYROIDAB in the last 72 hours. Anemia Panel: No results for input(s): VITAMINB12,  FOLATE, FERRITIN, TIBC, IRON, RETICCTPCT in the last 72 hours. Urine analysis:    Component Value Date/Time   COLORURINE YELLOW 08/28/2020 0828   APPEARANCEUR HAZY (A) 08/28/2020 0828   LABSPEC 1.015 08/28/2020 0828   PHURINE 5.0 08/28/2020 0828   GLUCOSEU NEGATIVE 08/28/2020 0828   HGBUR SMALL (A) 08/28/2020 0828   BILIRUBINUR NEGATIVE 08/28/2020 0828   KETONESUR NEGATIVE 08/28/2020 0828   PROTEINUR 100 (A) 08/28/2020 0828   NITRITE NEGATIVE 08/28/2020 0828   LEUKOCYTESUR NEGATIVE 08/28/2020 0828    Radiological Exams on Admission: DG Chest Portable 1 View  Result Date: 05/29/2021 CLINICAL DATA:  Shortness of breath x1 week. EXAM: PORTABLE CHEST 1 VIEW COMPARISON:  August 30, 2020 FINDINGS: Mild to moderate severity areas of atelectasis and/or infiltrate are seen within the bilateral lung bases, left greater than right. There is no evidence of a pleural effusion or pneumothorax. The cardiac silhouette is mildly enlarged. There is mild calcification of the aortic arch. The visualized skeletal structures are unremarkable. IMPRESSION: Stable cardiomegaly with mild to moderate severity bibasilar atelectasis and/or infiltrate. Electronically Signed   By: Virgina Norfolk M.D.   On: 05/29/2021 18:13     Assessment/Plan 60 year old female with history of SLE, autoimmune hepatitis on prednisone, liver cirrhosis, morbid obesity with BMI of 53, type 2 diabetes , COPD, mod-severe pulmonary HTN, diastolic CHF with EF 55 to 60%, 08/2020, and suspected OSA/OHS, on home O2 at 3 L and discharged with home trilogy in July 2021, who presents to the ED with a 1 week history of shortness of breath, dyspnea on exertion, not improving with 3 days of outpatient antibiotics.     Acute on chronic respiratory failure with hypoxia in the setting of severe pulmonary hypertension/COPD/CHF/?  OSA/OHS - Patient had increased work of breathing with O2 sat in the 80s on 3 L on arrival of EMS, requiring CPAP,  transitioned to BiPAP on arrival - Suspect multifactorial - Procalcitonin less than 0.1, so no antibiotics -We will get D-dimer and if elevated, CTA chest - Continue BiPAP and wean as tolerated - Treat potential etiologies.  Acute on chronic diastolic CHF  -patient with shortness of breath on exertion, slightly elevated BNP of 122 with chest x-ray showing cardiomegaly - Last echocardiogram in September 2021 with grade 2 diastolic dysfunction -Echocardiogram 9/28 with a EF 55 to 81%, grade 2 diastolic dysfunction -Right heart cath 9/27 showed moderate to severe pulmonary hypertension - Lasix twice daily - Continue home nadolol.  Not currently on ACE/ARB - Daily weights with intake and output monitor - Echocardiogram in the a.m.     COPD with chronic bronchitis ([place]) - Not acutely exacerbated.  Patient no wheezing on exam - DuoNebs as needed and continue home bronchodilators    SLE Autoimmune hepatitis -CellCept and prednisone, pending med rec    Chronic pain - Continue OxyContin    Diabetes mellitus, type 2 (HCC) - Sliding scale insulin coverage    Morbid obesity with BMI of 50.0-59.9, adult (HCC) - Complicating factor to overall prognosis and care      DVT prophylaxis: Lovenox  Code Status: full code  Family Communication:  none  Disposition Plan: Back to previous home environment Consults called: cardiology Status:At the time of admission, it appears that the appropriate admission status for this patient is INPATIENT. This is judged to be reasonable and necessary in order  to provide the required intensity of service to ensure the patient's safety given the presenting symptoms, physical exam findings, and initial radiographic and laboratory data in the context of their  Comorbid conditions.   Patient requires inpatient status due to high intensity of service, high risk for further deterioration and high frequency of surveillance required.   I certify that at the point of  admission it is my clinical judgment that the patient will require inpatient hospital care spanning beyond [place] MD [facility]     05/29/2021, 8:06 PM

## 2021-05-29 NOTE — Progress Notes (Signed)
PHARMACIST - PHYSICIAN COMMUNICATION  CONCERNING:  Enoxaparin (Lovenox) for DVT Prophylaxis    RECOMMENDATION: Patient was prescribed enoxaprin 54m q24 hours for VTE prophylaxis.   Filed Weights   05/29/21 1721  Weight: (!) 147 kg (324 lb 1.2 oz)    Body mass index is 53.93 kg/m.  Estimated Creatinine Clearance: 107 mL/min (by C-G formula based on SCr of 0.72 mg/dL).   [place] for enoxaparin 0.583mkg TBW SQ every 24 hours based on BMI being >30.  DESCRIPTION: [facility]  Patient is now receiving enoxaparin 72.5 mg every 24 hours    SuBerta MinorPharmD Clinical Pharmacist  05/29/2021 8:07 PM

## 2021-05-29 NOTE — ED Provider Notes (Signed)
[facility] Emergency Department Provider Note  Time seen: 5:23 PM  I have reviewed the triage vital signs and the nursing notes.   HISTORY  Chief Complaint Shortness of Breath   HPI Monica Ellis is a 60 y.o. female with a past medical history of diabetes, gastric reflux, hypertension, lupus, morbid obesity, COPD, CHF, presents to the emergency department for shortness of breath.  According to the patient over the past week or so she has had progressive worsening shortness of breath.  Patient was prescribed doxycycline and increased her home prednisone but has not improved.  Patient denies any fever.  Denies any chest pain.  States shortness of breath mild at rest but significant with minimal exertion.  Patient wears chronic O2 2 L.  EMS states 82% on 2 L switched to CPAP which brought the patient to 95%.  Patient arrives on CPAP becomes visibly short of breath with just minimal movement when we moved her from the stretcher to the bed.   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   Brain tumor ([place])    hx of   Diabetes mellitus without complication ([place])    GERD (gastroesophageal reflux disease)    Hypertension    Lupus ([place])    Morbid obesity ([place])    Obesity hypoventilation syndrome ([place])    Pneumonia    Tobacco use disorder     Patient Active Problem List   Diagnosis Date Noted   Acute exacerbation of CHF (congestive heart failure) ([place]) 09/05/2020   Nicotine dependence, cigarettes, uncomplicated 65/68/1275   Acute on chronic respiratory failure with hypoxia ([place]) 06/10/2020   Nausea vomiting and diarrhea 06/14/2019   AKI (acute kidney injury) ([place]) 06/14/2019   CAP (community acquired pneumonia) 02/13/2018   Dizziness 02/12/2018   Autoimmune hepatitis ([place]) 11/18/2017   Essential hypertension 11/18/2017   Class 3 severe obesity due to excess calories with serious comorbidity and  body mass index (BMI) of 40.0 to 44.9 in adult ([place]) 11/18/2017   History of depression 11/18/2017   GERD without esophagitis 11/18/2017   Dyslipidemia 11/18/2017   Vitamin D deficiency 11/18/2017   History of migraine 11/18/2017   History of meningioma 11/18/2017   DDD (degenerative disc disease), cervical 11/18/2017   DDD (degenerative disc disease), lumbar 11/18/2017   Acute diverticulitis 06/07/2017   Hypokalemia 06/07/2017   Hypomagnesemia 06/07/2017   Tobacco use disorder 06/07/2017   Atelectasis of both lungs 06/07/2017   Acute maxillary sinusitis 10/13/2016   Abnormal LFTs    Pain in the chest    Controlled type 2 diabetes mellitus with diabetic polyneuropathy, with long-term current use of insulin ([place])    Hypoxia 12/29/2015   Uncontrolled type 2 diabetes mellitus ([place]) 12/29/2015   Dyspnea 12/29/2015   LLQ pain 12/29/2015   Lupus ([place]) 12/29/2015   Chest pain 12/29/2015   Transaminitis 12/29/2015   Chronic pain 12/29/2015   Depression 12/29/2015    Past Surgical History:  Procedure Laterality Date   CHOLECYSTECTOMY     DILATION AND CURETTAGE OF UTERUS     EYE SURGERY Bilateral    laser surgery    IR TRANSCATHETER BX  11/03/2018   IR US GUIDE VASC ACCESS RIGHT  11/03/2018   IR VENOGRAM HEPATIC W HEMODYNAMIC EVALUATION  11/03/2018   RIGHT HEART CATH N/A 09/01/2020   Procedure: RIGHT HEART CATH;  Surgeon: Jolaine Artist, MD;  Location: [facility];  Service: Cardiovascular;  Laterality: N/A;    Prior to Admission medications   Medication Sig Start Date End Date Taking? Authorizing Provider  amLODipine (NORVASC) 10 MG tablet Take 10 mg by mouth daily. 08/04/20   [provider]  ascorbic acid (VITAMIN C) 500 MG tablet Take 2,000 mg by mouth daily.    [provider]  buPROPion (WELLBUTRIN XL) 150 MG 24 hr tablet Take 150 mg by mouth daily. 05/21/20   [provider]  Cholecalciferol (VITAMIN D PO) Take 1 tablet by mouth every  evening.     [provider]  DULoxetine (CYMBALTA) 60 MG capsule Take 60 mg by mouth at bedtime.  08/26/16   [provider]  fexofenadine-pseudoephedrine (ALLEGRA-D 24) 180-240 MG 24 hr tablet Take 1 tablet by mouth daily.    [provider]  insulin lispro protamine-lispro (HUMALOG 75/25 MIX) (75-25) 100 UNIT/ML SUSP injection Inject 120 Units into the skin 3 (three) times daily with meals. 06/17/20   Domenic Polite, MD  lactulose (CHRONULAC) 10 GM/15ML solution Take 30 mLs by mouth 3 (three) times daily as needed for constipation. 11/22/19   [provider]  LEVSIN 0.125 MG tablet Take 0.125 mg by mouth every 4 (four) hours as needed for spasms. 08/08/20   [provider]  LORazepam (ATIVAN) 0.5 MG tablet Take 0.5 mg by mouth 3 (three) times daily as needed for anxiety. 08/27/20   [provider]  magnesium 30 MG tablet Take 30 mg by mouth at bedtime.     [provider]  Multiple Vitamin (MULTIVITAMIN) tablet Take 1 tablet by mouth daily.     [provider]  mycophenolate (MYFORTIC) 360 MG TBEC EC tablet Take 720 mg by mouth 2 (two) times daily. 05/13/20   [provider]  nadolol (CORGARD) 80 MG tablet Take 80 mg by mouth 2 (two) times daily. 07/06/20   [provider]  NASAL SALINE NA Place 1 spray into the nose daily as needed (Congestion and Allergies).    [provider]  Omega-3 Fatty Acids (FISH OIL PO) Take 1 capsule by mouth every evening.     [provider]  oxyCODONE (OXYCONTIN) 10 mg 12 hr tablet Take 10 mg by mouth every 6 (six) hours as needed for pain.    [provider]  potassium chloride SA (KLOR-CON) 20 MEQ tablet Take 2 tablets (40 mEq total) by mouth daily. 09/05/20 11/04/20  Terrilee Croak, MD  predniSONE (DELTASONE) 5 MG tablet Take 12.5 mg by mouth daily with breakfast.     [provider]  spironolactone (ALDACTONE) 25 MG tablet Take 1 tablet (25 mg  total) by mouth daily. 06/18/20   Domenic Polite, MD  torsemide (DEMADEX) 20 MG tablet Take 2 tablets (40 mg total) by mouth daily. 09/06/20 11/05/20  Terrilee Croak, MD  TURMERIC PO Take 400 mg by mouth daily.     [provider]  zolpidem (AMBIEN) 10 MG tablet Take 10 mg by mouth at bedtime. 05/15/19   [provider]    Allergies  Allergen Reactions   Ace Inhibitors Swelling   Imuran [Azathioprine] Nausea And Vomiting    Severe N/V/D and AKI after starting the med.   Lisinopril Swelling and Other (See Comments)    Other reaction(s): Angioedema (ALLERGY/intolerance), Face   Protonix [Pantoprazole] Swelling    Pt states that it causes her lips to swell.    Tramadol Other (See Comments)    Other reaction(s): Mental Status Changes (intolerance)  Iodinated Diagnostic Agents Swelling    When patient was in her 20's, swelled up all over after getting getting injection of contrast; did not have any other symptoms/ was given benadryl after that happened and had no further problems per patients/   Iodine Swelling   Janumet [Sitagliptin-Metformin Hcl] Other (See Comments)    Continuous yeast infection   Omeprazole Swelling    Lips will swell   Victoza [Liraglutide] Nausea And Vomiting    Family History  Problem Relation Age of Onset   Diabetes Father    Hypertension Father    Hypertension Mother    Kidney cancer Maternal Uncle    Cancer Maternal Uncle        brain    Social History Social History   Tobacco Use   Smoking status: Every Day    Packs/day: 1.00    Years: 30.00    Pack years: 30.00    Types: Cigarettes   Smokeless tobacco: Never  Vaping Use   Vaping Use: Never used  Substance Use Topics   Alcohol use: No   Drug use: No    Review of Systems Constitutional: Negative for fever. Cardiovascular: Negative for chest pain. Respiratory: Positive for shortness of breath worse with exertion Gastrointestinal: Negative for abdominal pain, vomiting   Musculoskeletal: Negative for musculoskeletal complaints Neurological: Negative for headache All other ROS negative  ____________________________________________   PHYSICAL EXAM:  VITAL SIGNS: ED Triage Vitals [05/29/21 1721]  Enc Vitals Group     BP (!) 131/103     Pulse Rate 60     Resp 20     Temp      Temp src      SpO2 95 %     Weight (!) 324 lb 1.2 oz (147 kg)     Height _0  (1.651 m)     Head Circumference      Peak Flow      Pain Score 0     Pain Loc      Pain Edu?      Excl. in [place]?    Constitutional: Alert and oriented. Well appearing and in no distress. Eyes: Normal exam ENT      Head: Normocephalic and atraumatic.      Mouth/Throat: Mucous membranes are moist. Cardiovascular: Normal rate, regular rhythm. Respiratory: Normal respiratory effort without tachypnea nor retractions. Breath sounds are clear  Gastrointestinal: Soft and nontender. No distention.  Musculoskeletal: Nontender with normal range of motion in all extremities.  Neurologic:  Normal speech and language. No gross focal neurologic deficits  Skin:  Skin is warm, dry and intact.  Psychiatric: Mood and affect are normal.   ____________________________________________    EKG  EKG viewed and interpreted by myself shows a normal sinus rhythm at 63 bpm with a narrow QRS, normal axis, normal intervals, no concerning ST changes.  ____________________________________________    RADIOLOGY  Chest x-ray shows cardiomegaly with mild to moderate atelectasis versus infiltrate.  ____________________________________________   INITIAL IMPRESSION / ASSESSMENT AND PLAN / ED COURSE  Pertinent labs & imaging results that were available during my care of the patient were reviewed by me and considered in my medical decision making (see chart for details).   Patient presents emergency department for shortness of breath worse over the past week or so.  Patient satting 82% on her home O2 switched to CPAP  and is now in the mid 90s.  Patient transitioned to BiPAP in the emergency department and is appearing more comfortable.  We will check labs including cardiac enzymes, BNP obtain a portable chest x-ray, COVID swab and continue to closely monitor.  Patient agreeable to plan of care.  Patient's labs have resulted showing mild BNP elevation, chest x-ray appears to be more consistent with CHF.  Patient was able to tolerate coming off of BiPAP after approximately 1 to 2 hours.  Patient given Lasix.  We will admit to the hospital service for further work-up and treatment  Viktoriya Kiener was evaluated in Emergency Department on 05/29/2021 for the symptoms described in the history of present illness. She was evaluated in the context of the global COVID-19 pandemic, which necessitated consideration that the patient might be at risk for infection with the SARS-CoV-2 virus that causes COVID-19. Institutional protocols and algorithms that pertain to the evaluation of patients at risk for COVID-19 are in a state of rapid change based on information released by regulatory bodies including the CDC and federal and state organizations. These policies and algorithms were followed during the patient's care in the ED.  CRITICAL CARE Performed by: Harvest Dark   Total critical care time: 30 minutes  Critical care time was exclusive of separately billable procedures and treating other patients.  Critical care was necessary to treat or prevent imminent or life-threatening deterioration.  Critical care was time spent personally by me on the following activities: development of treatment plan with patient and/or surrogate as well as nursing, discussions with consultants, evaluation of patient's response to treatment, examination of patient, obtaining history from patient or surrogate, ordering and performing treatments and interventions, ordering and review of laboratory studies, ordering and review of radiographic  studies, pulse oximetry and re-evaluation of patient's condition.  ____________________________________________   FINAL CLINICAL IMPRESSION(S) / ED DIAGNOSES  Dyspnea CHF exacerbation   Harvest Dark, MD 05/29/21 2317

## 2021-05-29 NOTE — ED Triage Notes (Addendum)
Pt to ED via [facility] from home. Per EMS pt has been having increased WOB and SOB x1 wk. Pt with RA sats 80% at home on 3L NCand placed on CPAP. Pt dx with pneumonia and sent home on prednisone and doxycycline. Pt with hx COPD, lupus and shrunken lung syndrome. Pt stating also noticed increased fluid retention in legs.

## 2021-05-29 NOTE — ED Notes (Signed)
RT called

## 2021-05-30 ENCOUNTER — Inpatient Hospital Stay: Payer: Medicare Other

## 2021-05-30 DIAGNOSIS — I5033 Acute on chronic diastolic (congestive) heart failure: Secondary | ICD-10-CM

## 2021-05-30 LAB — PROCALCITONIN: Procalcitonin: <0.1 ng/mL

## 2021-05-30 LAB — CBC
HCT: 36.1 % (ref 36.0–46.0)
Hemoglobin: 11.4 g/dL — ABNORMAL LOW (ref 12.0–15.0)
MCH: 30.3 pg (ref 26.0–34.0)
MCHC: 31.6 g/dL (ref 30.0–36.0)
MCV: 96 fL (ref 80.0–100.0)
Platelets: 205 10*3/uL (ref 150–400)
RBC: 3.76 MIL/uL — ABNORMAL LOW (ref 3.87–5.11)
RDW: 15.5 % (ref 11.5–15.5)
WBC: 10.6 10*3/uL — ABNORMAL HIGH (ref 4.0–10.5)
nRBC: 0.3 % — ABNORMAL HIGH (ref 0.0–0.2)

## 2021-05-30 LAB — CBG MONITORING, ED
Glucose-Capillary: 128 mg/dL — ABNORMAL HIGH (ref 70–99)
Glucose-Capillary: 127 mg/dL — ABNORMAL HIGH (ref 70–99)
Glucose-Capillary: 190 mg/dL — ABNORMAL HIGH (ref 70–99)

## 2021-05-30 LAB — BASIC METABOLIC PANEL
Anion gap: 4 — ABNORMAL LOW (ref 5–15)
BUN: 25 mg/dL — ABNORMAL HIGH (ref 8–23)
CO2: 35 mmol/L — ABNORMAL HIGH (ref 22–32)
Calcium: 9.1 mg/dL (ref 8.9–10.3)
Chloride: 98 mmol/L (ref 98–111)
Creatinine, Ser: 0.88 mg/dL (ref 0.44–1.00)
GFR, Estimated: >60 mL/min (ref >60)
Glucose, Bld: 140 mg/dL — ABNORMAL HIGH (ref 70–99)
Potassium: 4 mmol/L (ref 3.5–5.1)
Sodium: 137 mmol/L (ref 135–145)

## 2021-05-30 LAB — TROPONIN I (HIGH SENSITIVITY): Troponin I (High Sensitivity): 10 ng/L (ref <18)

## 2021-05-30 LAB — HEMOGLOBIN A1C
Hgb A1c MFr Bld: 5.3 % (ref 4.8–5.6)
Mean Plasma Glucose: 105.41 mg/dL

## 2021-05-30 LAB — GLUCOSE, CAPILLARY
Glucose-Capillary: 116 mg/dL — ABNORMAL HIGH (ref 70–99)
Glucose-Capillary: 196 mg/dL — ABNORMAL HIGH (ref 70–99)

## 2021-05-30 LAB — D-DIMER, QUANTITATIVE: D-Dimer, Quant: 1.13 ug/mL-FEU — ABNORMAL HIGH (ref 0.00–0.50)

## 2021-05-30 MED ORDER — MYCOPHENOLATE SODIUM 180 MG PO TBEC
1080.0000 mg | DELAYED_RELEASE_TABLET | Freq: Two times a day (BID) | ORAL | Status: DC
  Administered 2021-05-30 – 2021-05-31 (×2): 1080 mg via ORAL
  Filled 2021-05-30 (×3): qty 6

## 2021-05-30 MED ORDER — PREDNISONE 10 MG PO TABS
10.0000 mg | ORAL_TABLET | Freq: Every day | ORAL | Status: DC
  Administered 2021-05-30 – 2021-05-31 (×2): 10 mg via ORAL
  Filled 2021-05-30 (×2): qty 1

## 2021-05-30 MED ORDER — LORAZEPAM 0.5 MG PO TABS
0.5000 mg | ORAL_TABLET | Freq: Three times a day (TID) | ORAL | Status: DC | PRN
  Administered 2021-05-30 (×2): 0.5 mg via ORAL
  Filled 2021-05-30 (×2): qty 1

## 2021-05-30 NOTE — Consult Note (Signed)
Cardiology Consultation:   Patient ID: Monica Ellis MRN: [id number]; DOB: Dec 04, 1961  Admit date: 05/29/2021 Date of Consult: 05/30/2021  PCP:  Ferd Hibbs, NP   [facility] Providers Cardiologist:  Evalina Field, MD        Patient Profile:   Monica Ellis is a 60 y.o. female with a hx of PHTN, HFpEF, autoimmune hepatitis/lupus, morbid obesity, DM2, prior tobacco use, suspected obesity hypoventilation syndrome with recommended use of trilogy ventilator at night, tobacco use (40+ years), prior brain tumor, GERD, and who is being seen 05/30/2021 for the evaluation of acute on chronic HFpEF at the request of Dr. Kurtis Bushman.  History of Present Illness:   Monica Ellis is a 60 year old female with PMH as above.  She reports worsening fatigue and dyspnea since having COVID-23 June 4097 and complicated by her Lupus. She was previously evaluated at [facility] 08/2020 for worsening shortness of breath and weight gain and treated for acute on chronic hypoxic respiratory failure with moderate to severe pulmonary hypertension and exacerbation of diastolic CHF.  Right heart cath showed elevated wedge pressure.  Echo was performed with limited images.  EF 55 to 60%, NR WMA, moderate LVH, G2 DD, elevated LVEDP, low normal RVSF, RAP 8 mmHg.  She was suspected to mostly have who group 2 and 3 pulmonary hypertension.  Weight loss, diuresis, and BiPAP were recommended.  She was discharged on torsemide 40 mg daily.  It was recommended she take an extra torsemide 64m tablet PRN tablet in the afternoon for weight gain or shortness of breath.  KCl tab 40 M EQ BID was also recommended.  She was continued on amlodipine 158m spironolactone 2560md, and nadolol 49m63mD.  It was recommended she follow-up in the office.  She was lost to  follow-up.  She reports today that she did not follow-up with the advanced heart failure clinic in [place] to a misunderstanding regarding the etiology of her  symptoms.  She was under the impression that she only needed to follow-up with pulmonology.  She also reports that hospice changed her torsemide to furosemide 40 mg daily recently, and she wonders if this may be the reason that she has been holding onto fluid.  [facility]2021[facility] shortness [facility] breath, DOE, and productive cough with cloudy phlegm. She reports that leading up to admission she would barely be able to walk to the restroom and has noted SOB with reaching for objects in bed. She reports chronic LLEE>RLEE. She has noted some abdominal swelling but no increased LEE.   She had been prescribed antibiotics by her PCP 3 days prior without relief of symptoms. She reports chronic wt gain. She has severe HA and fatigue. She denies any CP, racing HR, palpitations, presyncope, or syncope. EMS reported oxygen saturations 80% on home oxygen of 3 L.  Initial vitals significant for BP 131/103, 95% on CPAP. Ddimer elevated at 1.13.  Chest x-ray with stable cardiomegaly and mild to moderate severe bibasilar atelectasis and/or infiltrate. HS Tn 8.   Past Medical History:  Diagnosis Date   Autoimmune hepatitis (HCC)[place] 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   Brain tumor (HCC)[place] hx of   Diabetes mellitus without complication (HCC)    GERD (gastroesophageal reflux disease)    Hypertension    Lupus (HCC)[place] Morbid obesity (HCC)[place] Obesity hypoventilation syndrome (HCC)    Pneumonia    Tobacco  use disorder     Past Surgical History:  Procedure Laterality Date   CHOLECYSTECTOMY     DILATION AND CURETTAGE OF UTERUS     EYE SURGERY Bilateral    laser surgery    IR TRANSCATHETER BX  11/03/2018   IR US GUIDE VASC ACCESS RIGHT  11/03/2018   IR VENOGRAM HEPATIC W HEMODYNAMIC EVALUATION  11/03/2018   RIGHT HEART CATH N/A 09/01/2020   Procedure: RIGHT HEART CATH;  Surgeon: Jolaine Artist, MD;  Location: [facility];  Service: Cardiovascular;  Laterality: N/A;      Home Medications:  Prior to Admission medications   Medication Sig Start Date End Date Taking? Authorizing Provider  amLODipine (NORVASC) 10 MG tablet Take 10 mg by mouth daily. 08/04/20  Yes [provider]  ascorbic acid (VITAMIN C) 500 MG tablet Take 2,000 mg by mouth daily.   Yes [provider]  buPROPion (WELLBUTRIN XL) 150 MG 24 hr tablet Take 150 mg by mouth in the morning and at bedtime. 05/21/20  Yes [provider]  Cholecalciferol 10 MCG (400 UNIT) CAPS Take 800 Units by mouth daily.   Yes [provider]  doxycycline (VIBRAMYCIN) 100 MG capsule Take 100 mg by mouth 2 (two) times daily. 05/26/21 06/09/21 Yes [provider]  DULoxetine (CYMBALTA) 60 MG capsule Take 60 mg by mouth at bedtime.  08/26/16  Yes [provider]  fexofenadine-pseudoephedrine (ALLEGRA-D 24) 180-240 MG 24 hr tablet Take 1 tablet by mouth daily.   Yes [provider]  furosemide (LASIX) 40 MG tablet Take 40 mg by mouth daily. 05/19/21  Yes [provider]  insulin lispro protamine-lispro (HUMALOG 75/25 MIX) (75-25) 100 UNIT/ML SUSP injection Inject 120 Units into the skin 3 (three) times daily with meals. 06/17/20  Yes Domenic Polite, MD  lactulose (CHRONULAC) 10 GM/15ML solution Take 30 mLs by mouth 3 (three) times daily as needed for constipation. 11/22/19  Yes [provider]  LORazepam (ATIVAN) 0.5 MG tablet Take 0.5 mg by mouth 3 (three) times daily as needed for anxiety. 08/27/20  Yes [provider]  magnesium 30 MG tablet Take 30 mg by mouth at bedtime.    Yes [provider]  methylPREDNISolone (MEDROL DOSEPAK) 4 MG TBPK tablet Take 4 mg by mouth See admin instructions. Take taper by mouth as directed. 05/26/21 06/01/21 Yes [provider]  Multiple Vitamin (MULTIVITAMIN) tablet Take 1 tablet by mouth daily.    Yes [provider]  mycophenolate (MYFORTIC) 360 MG TBEC EC tablet Take 1,080 mg  by mouth 2 (two) times daily. 05/13/20  Yes [provider]  nadolol (CORGARD) 80 MG tablet Take 80 mg by mouth daily. 07/06/20  Yes [provider]  NASAL SALINE NA Place 1 spray into the nose daily as needed (Congestion and Allergies).   Yes [provider]  Omega-3 Fatty Acids (FISH OIL PO) Take 1 capsule by mouth every evening.    Yes [provider]  oxyCODONE (OXYCONTIN) 10 mg 12 hr tablet Take 10 mg by mouth every 6 (six) hours as needed for pain.   Yes [provider]  potassium chloride SA (KLOR-CON) 20 MEQ tablet Take 2 tablets (40 mEq total) by mouth daily. 09/05/20 05/29/21 Yes Dahal, Marlowe Aschoff, MD  predniSONE (DELTASONE) 5 MG tablet Take 10 mg by mouth daily with breakfast.   Yes [provider]  spironolactone (ALDACTONE) 25 MG tablet Take 1 tablet (25 mg total) by mouth daily. 06/18/20  Yes  Domenic Polite, MD  TURMERIC PO Take 400 mg by mouth daily.    Yes [provider]  zolpidem (AMBIEN) 10 MG tablet Take 10 mg by mouth at bedtime. 05/15/19  Yes [provider]    Inpatient Medications: Scheduled Meds:  enoxaparin (LOVENOX) injection  0.5 mg/kg Subcutaneous Q24H   furosemide  40 mg Intravenous BID   insulin aspart  0-20 Units Subcutaneous TID WC   insulin aspart  0-5 Units Subcutaneous QHS   nadolol  80 mg Oral BID   spironolactone  25 mg Oral Daily   Continuous Infusions:  PRN Meds: acetaminophen **OR** acetaminophen, HYDROcodone-acetaminophen, ondansetron **OR** ondansetron (ZOFRAN) IV  Allergies:    Allergies  Allergen Reactions   Ace Inhibitors Swelling   Imuran [Azathioprine] Nausea And Vomiting    Severe N/V/D and AKI after starting the med.   Lisinopril Swelling and Other (See Comments)    Other reaction(s): Angioedema (ALLERGY/intolerance), Face   Protonix [Pantoprazole] Swelling    Pt states that it causes her lips to swell.    Tramadol Other (See Comments)    Other reaction(s): Mental Status  Changes (intolerance)   Iodinated Diagnostic Agents Swelling    When patient was in her 20's, swelled up all over after getting getting injection of contrast; did not have any other symptoms/ was given benadryl after that happened and had no further problems per patients/   Iodine Swelling   Omeprazole Swelling    Lips will swell    Social History:   Social History   Socioeconomic History   Marital status: Single    Spouse name: Not on file   Number of children: 2   Years of education: Not on file   Highest education level: Bachelor's degree (e.g., BA, AB, BS)  Occupational History    Comment: disabled  Tobacco Use   Smoking status: Every Day    Packs/day: 1.00    Years: 30.00    Pack years: 30.00    Types: Cigarettes   Smokeless tobacco: Never  Vaping Use   Vaping Use: Never used  Substance and Sexual Activity   Alcohol use: No   Drug use: No   Sexual activity: Not on file  Other Topics Concern   Not on file  Social History Narrative   Lives with mother   Caffeine- Diet Coke, 1 Liter   Social Determinants of Health   Financial Resource Strain: Not on file  Food Insecurity: Not on file  Transportation Needs: Not on file  Physical Activity: Not on file  Stress: Not on file  Social Connections: Not on file  Intimate Partner Violence: Not on file    Family History:    Family History  Problem Relation Age of Onset   Diabetes Father    Hypertension Father    Hypertension Mother    Kidney cancer Maternal Uncle    Cancer Maternal Uncle        brain     ROS:  Please see the history of present illness.  Review of Systems  Constitutional:  Positive for malaise/fatigue.  Respiratory:  Positive for cough, sputum production, shortness of breath and wheezing. Negative for hemoptysis.   Cardiovascular:  Positive for leg swelling. Negative for chest pain, palpitations and orthopnea.       Chronic L>R, abdominal distention  Musculoskeletal:  Negative for falls.   Neurological:  Positive for weakness and headaches. Negative for seizures and loss of consciousness.  All other systems reviewed and are negative.  All  other ROS reviewed and negative.     Physical Exam/Data:   Vitals:   05/30/21 0400 05/30/21 0500 05/30/21 0600 05/30/21 0800  BP: 124/64 117/76 116/62 125/65  Pulse: (!) 54 82 (!) 51 (!) 54  Resp: _0 Temp:      TempSrc:      SpO2: 97% 95% 95% 95%  Weight:      Height:       No intake or output data in the 24 hours ending 05/30/21 1036 Last 3 Weights 05/29/2021 09/05/2020 09/04/2020  Weight (lbs) 324 lb 1.2 oz 324 lb 1.2 oz 324 lb 4.8 oz  Weight (kg) 147 kg 147 kg 147.1 kg     Body mass index is 53.93 kg/m.  General: Obese female, NAD HEENT: normal.  [place] oxygen in place. Lymph: no adenopathy Neck: no JVD Endocrine:  No thryomegaly Vascular: No carotid bruits; FA pulses 2+ bilaterally without bruits  Cardiac:  normal S1, S2; RRR; no murmur  Lungs: Anterior auscultation only, bibasilar crackles Abd: obese, nontender, no hepatomegaly  Ext: Left lower extremity greater than that of right, nonpitting trace bilateral edema Musculoskeletal:  No deformities, BUE and BLE strength normal and equal Skin: warm and dry  Neuro:  CNs 2-12 intact, no focal abnormalities noted Psych:  Normal affect, tearful at times  EKG:  The EKG was personally reviewed and demonstrates:  NSR, PACs, 63 bpm, IVCD Telemetry:  Telemetry was personally reviewed and demonstrates:  NSR, PACs, PVCs  Relevant CV Studies:  Echo 08/2020  1. Limited images- technically difficult, Definity not given   2. LV E' velocity is low at 5-6 cm/sec. Left ventricular ejection  fraction, by estimation, is 55 to 60%. The left ventricle has normal  function. The left ventricle has no regional wall motion abnormalities.  There is moderate left ventricular  hypertrophy. Left ventricular diastolic parameters are consistent with  Grade II diastolic dysfunction  (pseudonormalization). Elevated left  ventricular end-diastolic pressure. The E/e' is 21.   3. Right ventricular systolic function is low normal. The right  ventricular size is mildly enlarged. Tricuspid regurgitation signal is  inadequate for assessing PA pressure.   4. The mitral valve was not well visualized. No evidence of mitral valve  regurgitation.   5. The aortic valve was not well visualized. Aortic valve regurgitation  is not visualized.   6. The inferior vena cava is normal in size with <50% respiratory  variability, suggesting right atrial pressure of 8 mmHg.    [place] 08/2020 Findings: RA = 15 RV = 80/18 PA = 80/35 (51) PCW = 26 Fick cardiac output/index = 7.2/2.9 PVR = 3.5 WU FA sat = 93% PA sat = 64%, 66% SVC sat = 60% Assessment: 1. 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 Moderately elevated left-sided filling pressures 3. High cardiac output with PVR 3.5 WU (given patient's body habitus this may be overestimated by Fick equation. Thermodilution not available on 5FR catheter) 4. 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 Continue diuresis and BIPAP. Will need weight loss. Glori Bickers, MD    Laboratory Data:  High Sensitivity Troponin:   Recent Labs  Lab 05/29/21 1722 05/30/21 0034  TROPONINIHS 8 10     Chemistry Recent Labs  Lab 05/29/21 1722 05/30/21 0445  NA 136 137  K 4.1 4.0  CL 103 98  CO2 30 35*  GLUCOSE 108* 140*  BUN 24* 25*  CREATININE 0.72 0.88  CALCIUM  9.0 9.1  GFRNONAA >60 >60  ANIONGAP 3* 4*    Recent Labs  Lab 05/29/21 1722  PROT 9.4*  ALBUMIN 3.4*  AST 36  ALT 58*  ALKPHOS 40  BILITOT 0.9   Hematology Recent Labs  Lab 05/29/21 1722 05/30/21 0445  WBC 8.8 10.6*  RBC 3.71* 3.76*  HGB 11.5* 11.4*  HCT 34.9* 36.1  MCV 94.1 96.0  MCH 31.0 30.3  MCHC 33.0 31.6  RDW 15.5 15.5  PLT 201 205   BNP Recent Labs  Lab 05/29/21 1722  BNP 122.2*    DDimer   Recent Labs  Lab 05/30/21 0445  DDIMER 1.13*     Radiology/Studies:  DG Chest Portable 1 View  Result Date: 05/29/2021 CLINICAL DATA:  Shortness of breath x1 week. EXAM: PORTABLE CHEST 1 VIEW COMPARISON:  August 30, 2020 FINDINGS: Mild to moderate severity areas of atelectasis and/or infiltrate are seen within the bilateral lung bases, left greater than right. There is no evidence of a pleural effusion or pneumothorax. The cardiac silhouette is mildly enlarged. There is mild calcification of the aortic arch. The visualized skeletal structures are unremarkable. IMPRESSION: Stable cardiomegaly with mild to moderate severity bibasilar atelectasis and/or infiltrate. Electronically Signed   By: Virgina Norfolk M.D.   On: 05/29/2021 18:13     Assessment and Plan:   Acute on chronic hypoxic respiratory failure --Admitted with SOB, likely multifactorial in the setting of HFpEF, pulmonary hypertension, COPD, OHS, OSA.    Continue IV diuresis as renal function and BP allows. Continue BiPAP as needed. Continue breathing treatments. Wt loss recommended.  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 Known moderate to severe mixed pulmonary hypertension with moderately elevated left-sided filling pressures. Previous RHC 08/2020 with moderate elevated left-sided filling pressures and high CO with PRV 3.5W. BNP 122.2. Continue IV diuresis as Cr/BP allow. Current renal function stable. Suspect will need to discharge on higher diuresis than the previous lasix she was prescribed as an OP. Will attempt to update an echo as ordered, though suspect sub-optimal images. No current plan to repeat RHC from 08/2020 at this time unless diuresis proves limited by renal function. Daily standing weights, I's/O's. Likely will not be able to perform ReDS due to body habitus.  Will need to ensure follow-up in the advanced HF clinic in [place]. CHF education provided today and should  also have education via CHF RN this admission.  HTN Currently well controlled.  Continue spironolactone, nadolol.  Of note, per previous admission, recommendations were for amlodipine 94m, spironolactone 259mqd, and nadolol 8048mID.    CKD --Cr 0.72  0.88 with BUN 24  25 Daily BMET.  Anemia, suspect of chronic dz --Hgb 11.4, HCT 36.1 Daily CBC  Elevated Ddimer --Ddimer 1.13.  Consider LLE [place] rule out DVT +/- CT or VQ scan per IM.  Leukocytosis --WBC 10.6. Nonspecific, Further workup per IM.  OSA and OHS --Wt loss, CPAP recommended.  DM2 --SSI, Per IM.   Risk Assessment/Risk Scores:        New York Heart Association (NYHA) Functional Class NYHA Class IV        For questions or updates, please contact [facility] Please consult [email or web address] for contact info under    Signed, JacArvil ChacoA-C  05/30/2021 10:36 AM

## 2021-05-30 NOTE — ED Notes (Signed)
Pt up to bedside toilet. Pt tearful about her situation in general. Anxious. Pt requesting her home ativan order. Attending provider notified via secure chat.

## 2021-05-30 NOTE — ED Notes (Signed)
Informed RN bed assigned 

## 2021-05-30 NOTE — Progress Notes (Signed)
PROGRESS NOTE    Monica Ellis  WUJ:[id number] DOB: [birth year] DOA: 05/29/2021 PCP: Ferd Hibbs, NP    Brief Narrative:  Monica Ellis is a 60 y.o. female with medical history significant for SLE, autoimmune hepatitis on prednisone, liver cirrhosis, morbid obesity with BMI of 53, type 2 diabetes and tobacco use, COPD, moderate to severe pulmonary hypertension on right heart cath 08/5620, diastolic CHF with EF 55 to 60%, 08/2020, and suspected OSA/OHS, on home O2 at 3 L and discharged with home trilogy in July 2021, who presents to the ED with a 1 week history of shortness of breath, dyspnea on exertion and a cough productive of white cloudy phlegm.  She was prescribed antibiotics by her PCP 3 days prior but symptoms have not improved.  Patient has chronic left lower extremity edema that has been extensively worked up and reports no recent worsening of pain in the lower extremities. On arrival, afebrile with BP 131/103 with pulse of 60 respirations 20 O2 sat 95% on CPAP.  She was transitioned to BiPAP.COVID and flu negative.procal negative    Consultants:  cardiology  Procedures:   Antimicrobials:      Subjective: Not at baseline. Still with sob. No cp.   Objective: Vitals:   05/30/21 1345 05/30/21 1400 05/30/21 1420 05/30/21 1425  BP:  124/64  (!) 120/57  Pulse: (!) 51 (!) 51  (!) 52  Resp: _0 Temp:    99.5 F (37.5 C)  TempSrc:    Oral  SpO2:  93%  98%  Weight:   (!) 152.3 kg   Height:   _1  (1.651 m)    No intake or output data in the 24 hours ending 05/30/21 1443 Filed Weights   05/29/21 1721 05/30/21 1420  Weight: (!) 147 kg (!) 152.3 kg    Examination:  General exam: Appears calm and comfortable  Respiratory system: Decreased breath sounds, no wheezing  clear to auscultation. Respiratory effort normal. Cardiovascular system: S1 & S2 heard, RRR. No gallop  Gastrointestinal system: Abdomen is nondistended, soft and nontender.  Normal  bowel sounds heard. Central nervous system: Alert and oriented.  Grossly intact  extremities:+ edema Skin: Warm dry Psychiatry: Judgement and insight appear normal. Mood & affect appropriate.     Data Reviewed: I have personally reviewed following labs and imaging studies  CBC: Recent Labs  Lab 05/29/21 1722 05/30/21 0445  WBC 8.8 10.6*  NEUTROABS 6.2  --   HGB 11.5* 11.4*  HCT 34.9* 36.1  MCV 94.1 96.0  PLT 201 308   Basic Metabolic Panel: Recent Labs  Lab 05/29/21 1722 05/30/21 0445  NA 136 137  K 4.1 4.0  CL 103 98  CO2 30 35*  GLUCOSE 108* 140*  BUN 24* 25*  CREATININE 0.72 0.88  CALCIUM 9.0 9.1   GFR: Estimated Creatinine Clearance: 99.5 mL/min (by C-G formula based on SCr of 0.88 mg/dL). Liver Function Tests: Recent Labs  Lab 05/29/21 1722  AST 36  ALT 58*  ALKPHOS 40  BILITOT 0.9  PROT 9.4*  ALBUMIN 3.4*   No results for input(s): LIPASE, AMYLASE in the last 168 hours. No results for input(s): AMMONIA in the last 168 hours. Coagulation Profile: No results for input(s): INR, PROTIME in the last 168 hours. Cardiac Enzymes: No results for input(s): CKTOTAL, CKMB, CKMBINDEX, TROPONINI in the last 168 hours. BNP (last 3 results) No results for input(s): PROBNP in the last 8760 hours. HbA1C: Recent Labs    05/30/21 0034  HGBA1C 5.3   CBG: Recent Labs  Lab 05/29/21 2231 05/30/21 0455 05/30/21 0817 05/30/21 1158  GLUCAP 194* 128* 127* 190*   Lipid Profile: No results for input(s): CHOL, HDL, LDLCALC, TRIG, CHOLHDL, LDLDIRECT in the last 72 hours. Thyroid Function Tests: No results for input(s): TSH, T4TOTAL, FREET4, T3FREE, THYROIDAB in the last 72 hours. Anemia Panel: No results for input(s): VITAMINB12, FOLATE, FERRITIN, TIBC, IRON, RETICCTPCT in the last 72 hours. Sepsis Labs: Recent Labs  Lab 05/29/21 1842 05/30/21 0445  PROCALCITON <0.10 <0.10    Recent Results (from the past 240 hour(s))  Resp Panel by RT-PCR (Flu A&B, Covid)  Nasopharyngeal Swab     Status: None   Collection Time: 05/29/21  6:37 PM   Specimen: Nasopharyngeal Swab; Nasopharyngeal(NP) swabs in vial transport medium  Result Value Ref Range Status   SARS Coronavirus 2 by RT PCR NEGATIVE NEGATIVE Final    Comment: (NOTE) SARS-CoV-2 target nucleic acids are NOT DETECTED.  The SARS-CoV-2 RNA is generally detectable in upper respiratory specimens during the acute phase of infection. The lowest concentration of SARS-CoV-2 viral copies this assay can detect is 138 copies/mL. A negative result does not preclude SARS-Cov-2 infection and should not be used as the sole basis for treatment or other patient management decisions. A negative result may occur with  improper specimen collection/handling, submission of specimen other than nasopharyngeal swab, presence of viral mutation(s) within the areas targeted by this assay, and inadequate number of viral copies(<138 copies/mL). A negative result must be combined with clinical observations, patient history, and epidemiological information. The expected result is Negative.  Fact Sheet for Patients:  EntrepreneurPulse.com.au  Fact Sheet for Healthcare Providers:  IncredibleEmployment.be  This test is no t yet approved or cleared by the [place] FDA and  has been authorized for detection and/or diagnosis of SARS-CoV-2 by FDA under an Emergency Use Authorization (EUA). This EUA will remain  in effect (meaning this test can be used) for the duration of the COVID-19 declaration under Section 564(b)(1) of the Act, 21 U.S.C.section 360bbb-3(b)(1), unless the authorization is terminated  or revoked sooner.       Influenza A by PCR NEGATIVE NEGATIVE Final   Influenza B by PCR NEGATIVE NEGATIVE Final    Comment: (NOTE) The Xpert Xpress SARS-CoV-2/FLU/RSV plus assay is intended as an aid in the diagnosis of influenza from Nasopharyngeal swab specimens and should not be  used as a sole basis for treatment. Nasal washings and aspirates are unacceptable for Xpert Xpress SARS-CoV-2/FLU/RSV testing.  Fact Sheet for Patients: EntrepreneurPulse.com.au  Fact Sheet for Healthcare Providers: IncredibleEmployment.be  This test is not yet approved or cleared by the [place] FDA and has been authorized for detection and/or diagnosis of SARS-CoV-2 by FDA under an Emergency Use Authorization (EUA). This EUA will remain in effect (meaning this test can be used) for the duration of the COVID-19 declaration under Section 564(b)(1) of the Act, 21 U.S.C. section 360bbb-3(b)(1), unless the authorization is terminated or revoked.  Performed at [facility], [address], [place], [place] [address]          Radiology Studies: DG Chest Portable 1 View  Result Date: 05/29/2021 CLINICAL DATA:  Shortness of breath x1 week. EXAM: PORTABLE CHEST 1 VIEW COMPARISON:  August 30, 2020 FINDINGS: Mild to moderate severity areas of atelectasis and/or infiltrate are seen within the bilateral lung bases, left greater than right. There is no evidence of a pleural effusion or pneumothorax. The cardiac silhouette is mildly enlarged.  There is mild calcification of the aortic arch. The visualized skeletal structures are unremarkable. IMPRESSION: Stable cardiomegaly with mild to moderate severity bibasilar atelectasis and/or infiltrate. Electronically Signed   By: Virgina Norfolk M.D.   On: 05/29/2021 18:13        Scheduled Meds:  enoxaparin (LOVENOX) injection  0.5 mg/kg Subcutaneous Q24H   furosemide  40 mg Intravenous BID   insulin aspart  0-20 Units Subcutaneous TID WC   insulin aspart  0-5 Units Subcutaneous QHS   nadolol  80 mg Oral BID   spironolactone  25 mg Oral Daily   Continuous Infusions:  Assessment & Plan:   Principal Problem:   Acute on chronic diastolic CHF (congestive heart failure) (HCC) Active  Problems:   SLE (systemic lupus erythematosus related syndrome) (HCC)   Chronic pain   Diabetes mellitus, type 2 (HCC)   Autoimmune hepatitis ([place])   Morbid obesity with BMI of 50.0-59.9, adult (HCC)   Acute on chronic respiratory failure with hypoxia (HCC)   Nicotine dependence, cigarettes, uncomplicated   COPD with chronic bronchitis ([place])   60 year old female with history of SLE, autoimmune hepatitis on prednisone, liver cirrhosis, morbid obesity with BMI of 53, type 2 diabetes , COPD, mod-severe pulmonary HTN, diastolic CHF with EF 55 to 60%, 08/2020, and suspected OSA/OHS, on home O2 at 3 L and discharged with home trilogy in July 2021, who presents to the ED with a 1 week history of shortness of breath, dyspnea on exertion, not improving with 3 days of outpatient antibiotics.       Acute on chronic respiratory failure with hypoxia in the setting of severe pulmonary hypertension/COPD/CHF/ OSA/OHS - Patient had increased work of breathing with O2 sat in the 80s on 3 L on arrival of EMS, requiring CPAP, transitioned to BiPAP on arrival - Suspect multifactorial 6/25-procalcitonin negative, no role for antibiotics Ddimer mildly elevated,last one was before. Will obtain /bl LE [place] ro r/o dvt Continue 02 supplementation  BNP elevated however it might be underestimated due to her obesity Continue Lasix 40 mg IV twice daily    Acute on chronic diastolic CHF  -patient with shortness of breath on exertion, slightly elevated BNP of 122 with chest x-ray showing cardiomegaly - Last echocardiogram in September 2021 with grade 2 diastolic dysfunction -Echocardiogram 9/28 with a EF 55 to 41%, grade 2 diastolic dysfunction -Right heart cath 9/27 showed moderate to severe pulmonary hypertension 6/25 continue nadolol BP controlled Continue IV Lasix No need for another echo since last echo was less than a year ago I's and O's, daily weight       COPD with chronic bronchitis (HCC) - Not in acute  exacerbation  Continue home bronchodilators and duo nebs as needed       SLE Autoimmune hepatitis - Continue CellCept and prednisone - will d/w pharmacy       Chronic pain Continue oxycodone     Diabetes mellitus, type 2 (HCC) BG stable Continue R-ISS     Morbid obesity with BMI of 50.0-59.9, adult (HCC) - Complicating factor to overall prognosis and care will   DVT prophylaxis: Lovenox Code Status: DNR Family Communication: None at bedside Disposition Plan:  Status is: Inpatient  Remains inpatient appropriate because:Inpatient level of care appropriate due to severity of illness  Dispo: The patient is from: Home              Anticipated d/c is to: Home  Patient currently is not medically stable to d/c.   Difficult to place patient No            LOS: 1 day   Time spent: 45 minutes with more than 50% on [place], MD [facility] Pager [phone]  If 7PM-7AM, please contact night-coverage 05/30/2021, 2:43 PM

## 2021-05-30 NOTE — ED Notes (Signed)
Walked pt to the toilet.

## 2021-05-30 NOTE — ED Notes (Signed)
Called pharm about verifying ativan order. State they will have it verified soon.

## 2021-05-30 NOTE — ED Notes (Signed)
Pt ate 100% of meal tray

## 2021-05-30 NOTE — ED Notes (Signed)
Pt's daughter at bedside. Cards notified of daughter's presence at bedside and her phone number as pt asked this RN to have cards update pt's daughter.

## 2021-05-30 NOTE — ED Notes (Signed)
Pt given lunch tray by dietary staff.

## 2021-05-31 ENCOUNTER — Telehealth: Payer: Self-pay | Admitting: Physician Assistant

## 2021-05-31 LAB — BASIC METABOLIC PANEL
Anion gap: 6 (ref 5–15)
BUN: 30 mg/dL — ABNORMAL HIGH (ref 8–23)
CO2: 33 mmol/L — ABNORMAL HIGH (ref 22–32)
Calcium: 9 mg/dL (ref 8.9–10.3)
Chloride: 96 mmol/L — ABNORMAL LOW (ref 98–111)
Creatinine, Ser: 0.92 mg/dL (ref 0.44–1.00)
GFR, Estimated: >60 mL/min (ref >60)
Glucose, Bld: 173 mg/dL — ABNORMAL HIGH (ref 70–99)
Potassium: 4 mmol/L (ref 3.5–5.1)
Sodium: 135 mmol/L (ref 135–145)

## 2021-05-31 LAB — GLUCOSE, CAPILLARY
Glucose-Capillary: 127 mg/dL — ABNORMAL HIGH (ref 70–99)
Glucose-Capillary: 147 mg/dL — ABNORMAL HIGH (ref 70–99)

## 2021-05-31 LAB — PROCALCITONIN: Procalcitonin: <0.1 ng/mL

## 2021-05-31 MED ORDER — TORSEMIDE 20 MG PO TABS: 40.0000 mg | ORAL_TABLET | Freq: Every day | ORAL | Status: DC

## 2021-05-31 MED ORDER — NADOLOL 80 MG PO TABS
80.0000 mg | ORAL_TABLET | Freq: Every day | ORAL | 0 refills | Status: DC
Start: 2021-05-31 — End: 2022-09-05

## 2021-05-31 MED ORDER — TORSEMIDE 40 MG PO TABS: 40.0000 mg | ORAL_TABLET | Freq: Two times a day (BID) | ORAL | 0 refills | Status: DC

## 2021-05-31 MED ORDER — POTASSIUM CHLORIDE CRYS ER 20 MEQ PO TBCR
40.0000 meq | EXTENDED_RELEASE_TABLET | Freq: Every day | ORAL | 0 refills | Status: DC
Start: 2021-05-31 — End: 2023-08-29

## 2021-05-31 NOTE — Progress Notes (Signed)
Progress Note  Patient Name: Monica Ellis Date of Encounter: 05/31/2021  Primary Cardiologist: Evalina Field, MD   Subjective   Improved with IV diuresis when compared with yesterday.  Reports breathing still not at baseline, yet improved.  On 4L [place] oxygen today, uses 3.5L oxygen at home. Reports SOB with movement in bed, yet able to get up and use the bedside commode.  Denies chest pain leading up to admission or at this time. No dizziness or tachypalpitations. Does report chills, which she states is chronic since getting pna.  CHF education again reviewed, as well as follow-up with advanced HF clinic. She does report some financial and transportation barriers.   Suspect that coordinating with social work before discharge regarding follow-up at the [facility] HF clinic will help increase pt compliance.   Requests that I call her daughter, Jinny Blossom. Will make sure to call daughter later this afternoon with an update.  Inpatient Medications    Scheduled Meds:  enoxaparin (LOVENOX) injection  0.5 mg/kg Subcutaneous Q24H   furosemide  40 mg Intravenous BID   insulin aspart  0-20 Units Subcutaneous TID WC   insulin aspart  0-5 Units Subcutaneous QHS   mycophenolate  1,080 mg Oral BID   nadolol  80 mg Oral BID   predniSONE  10 mg Oral Q breakfast   spironolactone  25 mg Oral Daily   Continuous Infusions:  PRN Meds: acetaminophen **OR** acetaminophen, HYDROcodone-acetaminophen, LORazepam, ondansetron **OR** ondansetron (ZOFRAN) IV   Vital Signs    Vitals:   05/30/21 1425 05/30/21 2048 05/31/21 0445 05/31/21 0500  BP: (!) 120/57 119/63 121/67   Pulse: (!) 52 65 (!) 52   Resp: _0 Temp: 99.5 F (37.5 C) 98.3 F (36.8 C) 99.1 F (37.3 C)   TempSrc: Oral     SpO2: 98% 90% 92%   Weight:    (!) 158.7 kg  Height:       No intake or output data in the 24 hours ending 05/31/21 0736 Last 3 Weights 05/31/2021 05/30/2021 05/29/2021  Weight (lbs) 349 lb 14.4 oz  335 lb 11.2 oz 324 lb 1.2 oz  Weight (kg) 158.714 kg 152.273 kg 147 kg      Telemetry    SB-NSR, 50-60s, sinus arrhythmia- Personally Reviewed  ECG    No new tracings- Personally Reviewed  Physical Exam   GEN: Obese female, no acute distress.   Neck: JVD difficult to assess due to body habitus Cardiac: Distant heart sounds. RRR, no murmurs or rubs, or gallops.  Respiratory: R base crackles, Reduced L lung base breath sounds. GI: Firm, obese, slightly distended  MS: No pitting edema; No deformity. Neuro:  Nonfocal  Psych: Normal affect   Labs    High Sensitivity Troponin:   Recent Labs  Lab 05/29/21 1722 05/30/21 0034  TROPONINIHS 8 10      Chemistry Recent Labs  Lab 05/29/21 1722 05/30/21 0445 05/31/21 0419  NA 136 137 135  K 4.1 4.0 4.0  CL 103 98 96*  CO2 30 35* 33*  GLUCOSE 108* 140* 173*  BUN 24* 25* 30*  CREATININE 0.72 0.88 0.92  CALCIUM 9.0 9.1 9.0  PROT 9.4*  --   --   ALBUMIN 3.4*  --   --   AST 36  --   --   ALT 58*  --   --   ALKPHOS 40  --   --   BILITOT 0.9  --   --  GFRNONAA >60 >60 >60  ANIONGAP 3* 4* 6     Hematology Recent Labs  Lab 05/29/21 1722 05/30/21 0445  WBC 8.8 10.6*  RBC 3.71* 3.76*  HGB 11.5* 11.4*  HCT 34.9* 36.1  MCV 94.1 96.0  MCH 31.0 30.3  MCHC 33.0 31.6  RDW 15.5 15.5  PLT 201 205    BNP Recent Labs  Lab 05/29/21 1722  BNP 122.2*     DDimer  Recent Labs  Lab 05/30/21 0445  DDIMER 1.13*     Radiology    DG Chest Portable 1 View  Result Date: 05/29/2021 CLINICAL DATA:  Shortness of breath x1 week. EXAM: PORTABLE CHEST 1 VIEW COMPARISON:  August 30, 2020 FINDINGS: Mild to moderate severity areas of atelectasis and/or infiltrate are seen within the bilateral lung bases, left greater than right. There is no evidence of a pleural effusion or pneumothorax. The cardiac silhouette is mildly enlarged. There is mild calcification of the aortic arch. The visualized skeletal structures are unremarkable.  IMPRESSION: Stable cardiomegaly with mild to moderate severity bibasilar atelectasis and/or infiltrate. Electronically Signed   By: Virgina Norfolk M.D.   On: 05/29/2021 18:13    Cardiac Studies   Echo ordered, pending   LE [place] pending  Echo 08/2020  1. Limited images- technically difficult, Definity not given   2. LV E' velocity is low at 5-6 cm/sec. Left ventricular ejection  fraction, by estimation, is 55 to 60%. The left ventricle has normal  function. The left ventricle has no regional wall motion abnormalities.  There is moderate left ventricular  hypertrophy. Left ventricular diastolic parameters are consistent with  Grade II diastolic dysfunction (pseudonormalization). Elevated left  ventricular end-diastolic pressure. The E/e' is 21.   3. Right ventricular systolic function is low normal. The right  ventricular size is mildly enlarged. Tricuspid regurgitation signal is  inadequate for assessing PA pressure.   4. The mitral valve was not well visualized. No evidence of mitral valve  regurgitation.   5. The aortic valve was not well visualized. Aortic valve regurgitation  is not visualized.   6. The inferior vena cava is normal in size with <50% respiratory  variability, suggesting right atrial pressure of 8 mmHg.     [place] 08/2020 Findings: RA = 15 RV = 80/18 PA = 80/35 (51) PCW = 26 Fick cardiac output/index = 7.2/2.9 PVR = 3.5 WU FA sat = 93% PA sat = 64%, 66% SVC sat = 60% Assessment: 1. 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 Moderately elevated left-sided filling pressures 3. High cardiac output with PVR 3.5 WU (given patient's body habitus this may be overestimated by Fick equation. Thermodilution not available on 5FR catheter) 4. 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 Continue diuresis and BIPAP. Will need weight loss. Glori Bickers, MD    Patient Profile     60 y.o. 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  Prior to admission, received abx without relief. Presented volume up. Improving on IV diuresis. Weaned from BiPAP.  Continue breathing treatments and diuresis. Caution with steroids / albuterol given less desirable cardiac side effect profile. Recommend weaning prednisone once able. Overall, wt loss recommended for both symptomatic relief RF modification.   AOC HFpEF; Moderate to severe PHTN --Known PHTN / WHO group 2-3 pulmonary hypertension. Previous RHC 08/2020 as copied and pasted above. Suspect exacerbation this admission in part triggered by pt reported decrease in OP oral diuresis, as she reports her torsemide was recently changed to lasix by another provider. She also has not followed up with the HF clinic but agreeable to do so in the future. --Continue IV diuresis as Cr allows. Stable overnight. --Echo ordered, pending. --Daily standing weights  152.3kg  158.7kg, --I's/O's -->not yet tracked this admission. --CHF education. --F/u at discharge needs scheduled with advanced HF clinic in [place]. --Consider transition back to previously prescribed torsemide at discharge for adequate diuresis in the OP setting.    HTN --Continue spironolactone, nadolol. If needed, amlodipine can be added for additional support.    CKD --Daily BMET. Cr 0.88  0.92 with BUN 25  33   Anemia, suspect of chronic dz --Daily CBC. No CBC yet today. Previous H&H stable.   Elevated Ddimer --Ddimer 1.13. LE US performed  per IM, pending.   OSA, OHS --Wt loss, CPAP recommended.   DM2 --SSI, Per IM.      For questions or updates, please contact [place] Please consult [email or web address] for contact info under        Signed, Arvil Chaco, PA-C  05/31/2021, 7:36 AM

## 2021-05-31 NOTE — Telephone Encounter (Signed)
Attempted to contact daughter per pt request and update regarding her care from a cardiac perspective. Unable to reach at this time.

## 2021-05-31 NOTE — TOC Initial Note (Addendum)
Transition of Care [facility]) - Initial/Assessment Note    Patient Details  Name: Monica Ellis MRN: [id number] Date of Birth: 1961/04/10  [facility]) CM/SW Contact:    Beverly Sessions, RN Phone Number: 05/31/2021, 12:37 PM  Clinical Narrative:                      Patient admitted from home with CHF Patient states that she lives at home with mother Open with [facility] home hospice  Daughter Jinny Blossom to pick up at discharge and bring portable O2 Patient denies the need for any additional DME  VM left for Dawn with [facility] to notify of discharge  Update family unable to transport EMS transport called EMS packet on chart    Patient Goals and CMS Choice        Expected Discharge Plan and Services           Expected Discharge Date: 05/31/21                                    Prior Living Arrangements/Services                       Activities of Daily Living Home Assistive Devices/Equipment: Gilford Rile (specify type), Oxygen, Shower chair with back, Tub transfer bench, Grab bars around toilet, Built-in shower seat ADL Screening (condition at time of admission) Patient's cognitive ability adequate to safely complete daily activities?: No Is the patient deaf or have difficulty hearing?: No Does the patient have difficulty seeing, even when wearing glasses/contacts?: No Does the patient have difficulty concentrating, remembering, or making decisions?: No Patient able to express need for assistance with ADLs?: No Does the patient have difficulty dressing or bathing?: No Independently performs ADLs?: Yes (appropriate for developmental age) Does the patient have difficulty walking or climbing stairs?: Yes Weakness of Legs: Both Weakness of Arms/Hands: Both  Permission Sought/Granted                  Emotional Assessment              Admission diagnosis:  SOB (shortness of breath) [R06.02] CHF exacerbation ([place])  [I50.9] Congestive heart failure, unspecified HF chronicity, unspecified heart failure type [facility]) [I50.9] Patient Active Problem List   Diagnosis Date Noted   COPD with chronic bronchitis ([place]) 05/29/2021   Acute on chronic diastolic CHF (congestive heart failure) ([place]) 05/29/2021   Acute exacerbation of CHF (congestive heart failure) ([place]) 09/05/2020   Nicotine dependence, cigarettes, uncomplicated 40/98/1191   Acute on chronic respiratory failure with hypoxia ([place]) 06/10/2020   Nausea vomiting and diarrhea 06/14/2019   AKI (acute kidney injury) ([place]) 06/14/2019   CAP (community acquired pneumonia) 02/13/2018   Dizziness 02/12/2018   Autoimmune hepatitis ([place]) 11/18/2017   Essential hypertension 11/18/2017   Morbid obesity with BMI of 50.0-59.9, adult ([place]) 11/18/2017   History of depression 11/18/2017   GERD without esophagitis 11/18/2017   Dyslipidemia 11/18/2017   Vitamin D deficiency 11/18/2017   History of migraine 11/18/2017   History of meningioma 11/18/2017   DDD (degenerative disc disease), cervical 11/18/2017   DDD (degenerative disc disease), lumbar 11/18/2017   Acute diverticulitis 06/07/2017   Hypokalemia 06/07/2017   Hypomagnesemia 06/07/2017   Tobacco use disorder 06/07/2017   Atelectasis of both lungs 06/07/2017   Acute maxillary sinusitis 10/13/2016   Abnormal LFTs    Pain in the chest  Diabetes mellitus, type 2 ([place])    Hypoxia 12/29/2015   Uncontrolled type 2 diabetes mellitus ([place]) 12/29/2015   Dyspnea 12/29/2015   LLQ pain 12/29/2015   SLE (systemic lupus erythematosus related syndrome) ([place]) 12/29/2015   Chest pain 12/29/2015   Transaminitis 12/29/2015   Chronic pain 12/29/2015   Depression 12/29/2015   PCP:  Ferd Hibbs, NP Pharmacy:   [facility] [place] [address] Phone: [phone] Fax: [phone]     Social Determinants of Health  (SDOH) Interventions    Readmission Risk Interventions No flowsheet data found.

## 2021-05-31 NOTE — Discharge Summary (Signed)
Monica Ellis NWG:[id number] DOB: June 24, 1961 DOA: 05/29/2021  PCP: Ferd Hibbs, NP  Admit date: 05/29/2021 Discharge date: 05/31/2021  Admitted From: home Disposition:  home   Recommendations for Outpatient Follow-up:  Follow up with PCP in 1 week Please obtain BMP/CBC in one week Please follow up with cardiology in one week Will need hospice to follow up with patient      Discharge Condition:Stable CODE STATUS:DNR  Diet recommendation: Heart Healthy / Carb Modified  Brief/Interim Summary: Per HPI: Monica Ellis is a 60 y.o. 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  She was prescribed antibiotics by her PCP 3 days prior but symptoms have not improved.Patient has chronic left lower extremity edema that has been extensively worked up and reports no recent worsening of pain in the lower extremities.  She denies orthopnea, chest pain,  fever or chills.EMS reported an O2 sat of 80% on her home flow rate of 3 L and she was transported on CPAP.  Last echo was 09/02/2020 at [facility] showing preserved ejection fraction, EF 55 to 96%, grade 2 diastolic dysfunction.  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  Specialist has been trying to titrate off prednisone, currently she takes 10 mg daily.  Mycophenolate also added due to liver disease.     Cxr Stable cardiomegaly with mild to moderate severity  bibasilar atelectasis and/or infiltrate  Cardiology was consulted. Patient was admitted and started on iv lasix.    Acute on chronic respiratory failure with hypoxia in the setting of severe pulmonary hypertension/COPD/CHF/ OSA/OHS - Patient had increased work of breathing with O2 sat in the 80s on 3 L on arrival of EMS, requiring CPAP, transitioned to BiPAP on arrival...>no on 4L sleeping. Feels better and stable to be discharged home suspect multifactorial procalcitonin negative, no role for antibiotics Ddimer mildly elevated,last one was before.  Bilateral lower extremity Dopplers negative for DVT  Was treated with Lasix IV.  Close to her baseline O2.  She is requiring 3.5 to 4 L of O2.   Acute on chronic diastolic CHF  Cardiology was following patient. She was continued on her nadolol 80 mg, with current dose of twice daily now. She was treated with Lasix.  On discharge I spoke to cardiology they recommended torsemide 40 mg twice daily with Kcl 40 mEq daily She will need to follow-up with cardiology in 1 week       COPD with chronic bronchitis ([place]) - Not in acute exacerbation Continue home regimen      SLE Autoimmune hepatitis - Continue CellCept and prednisone as previously prescribed      Chronic pain Continue oxycodone     Diabetes mellitus, type 2 (HCC) BG stable Continue home regimen     Morbid obesity with BMI of 50.0-59.9, adult (HCC) - Complicating factor to overall prognosis and care will    Discharge Diagnoses:  Principal Problem:   Acute on chronic diastolic CHF (congestive heart failure) (  [place]) Active Problems:   SLE (systemic lupus erythematosus related syndrome) (HCC)   Chronic pain   Diabetes mellitus, type 2 (HCC)   Autoimmune hepatitis ([place])   Morbid obesity with BMI of 50.0-59.9, adult (HCC)   Acute on chronic respiratory failure with hypoxia (HCC)   Nicotine dependence, cigarettes, uncomplicated   COPD with chronic bronchitis  [facility])    Discharge Instructions  Discharge Instructions     Call MD for:  difficulty breathing, headache or visual disturbances   Complete by: As directed    Diet - low sodium heart healthy   Complete by: As directed    Diet Carb Modified   Complete by: As directed    Increase activity slowly   Complete by: As directed       Allergies as of 05/31/2021       Reactions   Ace Inhibitors Swelling   Imuran [azathioprine] Nausea And Vomiting   Severe N/V/D and AKI after starting the med.   Lisinopril Swelling, Other (See Comments)   Other reaction(s): Angioedema (ALLERGY/intolerance), Face   Protonix [pantoprazole] Swelling   Pt states that it causes her lips to swell.    Tramadol Other (See Comments)   Other reaction(s): Mental Status Changes (intolerance)   Iodinated Diagnostic Agents Swelling   When patient was in her 20's, swelled up all over after getting getting injection of contrast; did not have any other symptoms/ was given benadryl after that happened and had no further problems per patients/   Iodine Swelling   Omeprazole Swelling   Lips will swell        Medication List     STOP taking these medications    amLODipine 10 MG tablet Commonly known as: NORVASC   furosemide 40 MG tablet Commonly known as: LASIX   methylPREDNISolone 4 MG Tbpk tablet Commonly known as: MEDROL DOSEPAK       TAKE these medications    ascorbic acid 500 MG tablet Commonly known as: VITAMIN C Take 2,000 mg by mouth daily.   buPROPion 150 MG 24 hr tablet Commonly known as: WELLBUTRIN XL Take 150 mg by mouth in the morning and at bedtime.   Cholecalciferol 10 MCG (400 UNIT) Caps Take 800 Units by mouth daily.   doxycycline 100 MG capsule Commonly known as: VIBRAMYCIN Take 100 mg by mouth 2 (two) times daily.   DULoxetine 60 MG capsule Commonly known as: CYMBALTA Take 60 mg by mouth at bedtime.   fexofenadine-pseudoephedrine 180-240 MG 24 hr tablet Commonly known  as: ALLEGRA-D 24 Take 1 tablet by mouth daily.   FISH OIL PO Take 1 capsule by mouth every evening.   insulin lispro protamine-lispro (75-25) 100 UNIT/ML Susp injection Commonly known as: HUMALOG 75/25 MIX Inject 120 Units into the skin 3 (three) times daily with meals.   lactulose 10 GM/15ML solution Commonly known as: CHRONULAC Take 30 mLs by mouth 3 (three) times daily as needed for constipation.   LORazepam 0.5 MG tablet Commonly known as: ATIVAN Take 0.5 mg by mouth 3 (three) times daily as needed for anxiety.   magnesium 30 MG tablet Take 30 mg by mouth at bedtime.   multivitamin tablet Take 1 tablet by mouth daily.   mycophenolate 360 MG Tbec EC tablet Commonly known as: MYFORTIC Take 1,080 mg by mouth 2 (two) times daily.   nadolol 80 MG tablet Commonly known as: CORGARD Take 1 tablet (80 mg total) by mouth daily.   NASAL SALINE NA Place 1 spray into the  nose daily as needed (Congestion and Allergies).   oxyCODONE 10 mg 12 hr tablet Commonly known as: OXYCONTIN Take 10 mg by mouth every 6 (six) hours as needed for pain.   potassium chloride SA 20 MEQ tablet Commonly known as: KLOR-CON Take 2 tablets (40 mEq total) by mouth daily.   predniSONE 5 MG tablet Commonly known as: DELTASONE Take 10 mg by mouth daily with breakfast.   spironolactone 25 MG tablet Commonly known as: ALDACTONE Take 1 tablet (25 mg total) by mouth daily.   Torsemide 40 MG Tabs Take 40 mg by mouth 2 (two) times daily.   TURMERIC PO Take 400 mg by mouth daily.   zolpidem 10 MG tablet Commonly known as: AMBIEN Take 10 mg by mouth at bedtime.        Follow-up Information     Kate Sable, MD Follow up in 1 week(s).   Specialties: Cardiology, Radiology Contact information: [place] [address] [phone]         Ferd Hibbs, NP Follow up in 1 week(s).   Specialty: Nurse Practitioner Contact information: [place] [address] [phone]                Allergies  Allergen Reactions   Ace Inhibitors Swelling   Imuran [Azathioprine] Nausea And Vomiting    Severe N/V/D and AKI after starting the med.   Lisinopril Swelling and Other (See Comments)    Other reaction(s): Angioedema (ALLERGY/intolerance), Face   Protonix [Pantoprazole] Swelling    Pt states that it causes her lips to swell.    Tramadol Other (See Comments)    Other reaction(s): Mental Status Changes (intolerance)   Iodinated Diagnostic Agents Swelling    When patient was in her 20's, swelled up all over after getting getting injection of contrast; did not have any other symptoms/ was given benadryl after that happened and had no further problems per patients/   Iodine Swelling   Omeprazole Swelling    Lips will swell    Consultations: Cardiology   Procedures/Studies: US Venous Img Lower Bilateral (DVT)  Result Date: 05/31/2021 CLINICAL DATA:  60 year old female with a history of swelling EXAM: BILATERAL LOWER EXTREMITY VENOUS DOPPLER ULTRASOUND TECHNIQUE: Gray-scale sonography with graded compression, as well as color Doppler and duplex ultrasound were performed to evaluate the lower extremity deep venous systems from the level of the common femoral vein and including the common femoral, femoral, profunda femoral, popliteal and calf veins including the posterior tibial, peroneal and gastrocnemius veins when visible. The superficial great saphenous vein was also interrogated. Spectral Doppler was utilized to evaluate flow at rest and with distal augmentation maneuvers in the common femoral, femoral and popliteal veins. COMPARISON:  None. FINDINGS: RIGHT LOWER EXTREMITY Common Femoral Vein: No evidence of thrombus. Normal compressibility, respiratory phasicity and response to augmentation. Saphenofemoral Junction: No evidence of thrombus. Normal compressibility and flow on color Doppler imaging. Profunda Femoral Vein: No  evidence of thrombus. Normal compressibility and flow on color Doppler imaging. Femoral Vein: No evidence of thrombus. Normal compressibility, respiratory phasicity and response to augmentation. Popliteal Vein: No evidence of thrombus. Normal compressibility, respiratory phasicity and response to augmentation. Calf Veins: No evidence of thrombus. Normal compressibility and flow on color Doppler imaging. Superficial Great Saphenous Vein: No evidence of thrombus. Normal compressibility and flow on color Doppler imaging. Other Findings:  Edema LEFT LOWER EXTREMITY Common Femoral Vein: No evidence of thrombus. Normal compressibility, respiratory phasicity and response to augmentation.  Saphenofemoral Junction: No evidence of thrombus. Normal compressibility and flow on color Doppler imaging. Profunda Femoral Vein: No evidence of thrombus. Normal compressibility and flow on color Doppler imaging. Femoral Vein: No evidence of thrombus. Normal compressibility, respiratory phasicity and response to augmentation. Popliteal Vein: No evidence of thrombus. Normal compressibility, respiratory phasicity and response to augmentation. Calf Veins: No evidence of thrombus. Normal compressibility and flow on color Doppler imaging. Superficial Great Saphenous Vein: No evidence of thrombus. Normal compressibility and flow on color Doppler imaging. Other Findings:  Edema IMPRESSION: Sonographic survey of the bilateral lower extremities negative for DVT Electronically Signed   By: Corrie Mckusick D.O.   On: 05/31/2021 08:40   DG Chest Portable 1 View  Result Date: 05/29/2021 CLINICAL DATA:  Shortness of breath x1 week. EXAM: PORTABLE CHEST 1 VIEW COMPARISON:  August 30, 2020 FINDINGS: Mild to moderate severity areas of atelectasis and/or infiltrate are seen within the bilateral lung bases, left greater than right. There is no evidence of a pleural effusion or pneumothorax. The cardiac silhouette is mildly enlarged. There is mild  calcification of the aortic arch. The visualized skeletal structures are unremarkable. IMPRESSION: Stable cardiomegaly with mild to moderate severity bibasilar atelectasis and/or infiltrate. Electronically Signed   By: Virgina Norfolk M.D.   On: 05/29/2021 18:13      Subjective:   Discharge Exam: Vitals:   05/31/21 0741 05/31/21 0951  BP: 137/68   Pulse: (!) 54 67  Resp: 20   Temp: 98.6 F (37 C)   SpO2: 98%    Vitals:   05/31/21 0445 05/31/21 0500 05/31/21 0741 05/31/21 0951  BP: 121/67  137/68   Pulse: (!) 52  (!) 54 67  Resp: 14  20   Temp: 99.1 F (37.3 C)  98.6 F (37 C)   TempSrc:   Oral   SpO2: 92%  98%   Weight:  (!) 158.7 kg    Height:        General: Pt is alert, awake, not in acute distress Cardiovascular: RRR, S1/S2 +, no rubs, no gallops Respiratory: CTA bilaterally, no wheezing, no rhonchi Abdominal: Soft, NT, ND, bowel sounds + Extremities: no edema    The results of significant diagnostics from this hospitalization (including imaging, microbiology, ancillary and laboratory) are listed below for reference.     Microbiology: Recent Results (from the past 240 hour(s))  Resp Panel by RT-PCR (Flu A&B, Covid) Nasopharyngeal Swab     Status: None   Collection Time: 05/29/21  6:37 PM   Specimen: Nasopharyngeal Swab; Nasopharyngeal(NP) swabs in vial transport medium  Result Value Ref Range Status   SARS Coronavirus 2 by RT PCR NEGATIVE NEGATIVE Final    Comment: (NOTE) SARS-CoV-2 target nucleic acids are NOT DETECTED.  The SARS-CoV-2 RNA is generally detectable in upper respiratory specimens during the acute phase of infection. The lowest concentration of SARS-CoV-2 viral copies this assay can detect is 138 copies/mL. A negative result does not preclude SARS-Cov-2 infection and should not be used as the sole basis for treatment or other patient management decisions. A negative result may occur with  improper specimen collection/handling, submission  of specimen other than nasopharyngeal swab, presence of viral mutation(s) within the areas targeted by this assay, and inadequate number of viral copies(<138 copies/mL). A negative result must be combined with clinical observations, patient history, and epidemiological information. The expected result is Negative.  Fact Sheet for Patients:  EntrepreneurPulse.com.au  Fact Sheet for Healthcare Providers:  IncredibleEmployment.be  This test is no  t yet approved or cleared by the [place] and  has been authorized for detection and/or diagnosis of SARS-CoV-2 by FDA under an Emergency Use Authorization (EUA). This EUA will remain  in effect (meaning this test can be used) for the duration of the COVID-19 declaration under Section 564(b)(1) of the Act, 21 U.S.C.section 360bbb-3(b)(1), unless the authorization is terminated  or revoked sooner.       Influenza A by PCR NEGATIVE NEGATIVE Final   Influenza B by PCR NEGATIVE NEGATIVE Final    Comment: (NOTE) The Xpert Xpress SARS-CoV-2/FLU/RSV plus assay is intended as an aid in the diagnosis of influenza from Nasopharyngeal swab specimens and should not be used as a sole basis for treatment. Nasal washings and aspirates are unacceptable for Xpert Xpress SARS-CoV-2/FLU/RSV testing.  Fact Sheet for Patients: EntrepreneurPulse.com.au  Fact Sheet for Healthcare Providers: IncredibleEmployment.be  This test is not yet approved or cleared by the [place] FDA and has been authorized for detection and/or diagnosis of SARS-CoV-2 by FDA under an Emergency Use Authorization (EUA). This EUA will remain in effect (meaning this test can be used) for the duration of the COVID-19 declaration under Section 564(b)(1) of the Act, 21 U.S.C. section 360bbb-3(b)(1), unless the authorization is terminated or revoked.  Performed at [facility], [address] [place], [place] [address]      Labs: BNP (last 3 results) Recent Labs    06/10/20 1630 08/28/20 0155 05/29/21 1722  BNP 88.4 167.7* 542.7*   Basic Metabolic Panel: Recent Labs  Lab 05/29/21 1722 05/30/21 0445 05/31/21 0419  NA 136 137 135  K 4.1 4.0 4.0  CL 103 98 96*  CO2 30 35* 33*  GLUCOSE 108* 140* 173*  BUN 24* 25* 30*  CREATININE 0.72 0.88 0.92  CALCIUM 9.0 9.1 9.0   Liver Function Tests: Recent Labs  Lab 05/29/21 1722  AST 36  ALT 58*  ALKPHOS 40  BILITOT 0.9  PROT 9.4*  ALBUMIN 3.4*   No results for input(s): LIPASE, AMYLASE in the last 168 hours. No results for input(s): AMMONIA in the last 168 hours. CBC: Recent Labs  Lab 05/29/21 1722 05/30/21 0445  WBC 8.8 10.6*  NEUTROABS 6.2  --   HGB 11.5* 11.4*  HCT 34.9* 36.1  MCV 94.1 96.0  PLT 201 205   Cardiac Enzymes: No results for input(s): CKTOTAL, CKMB, CKMBINDEX, TROPONINI in the last 168 hours. BNP: Invalid input(s): POCBNP CBG: Recent Labs  Lab 05/30/21 0817 05/30/21 1158 05/30/21 1742 05/30/21 2049 05/31/21 0742  GLUCAP 127* 190* 116* 196* 127*   D-Dimer Recent Labs    05/30/21 0445  DDIMER 1.13*   Hgb A1c Recent Labs    05/30/21 0034  HGBA1C 5.3   Lipid Profile No results for input(s): CHOL, HDL, LDLCALC, TRIG, CHOLHDL, LDLDIRECT in the last 72 hours. Thyroid function studies No results for input(s): TSH, T4TOTAL, T3FREE, THYROIDAB in the last 72 hours.  Invalid input(s): FREET3 Anemia work up No results for input(s): VITAMINB12, FOLATE, FERRITIN, TIBC, IRON, RETICCTPCT in the last 72 hours. Urinalysis    Component Value Date/Time   COLORURINE YELLOW 08/28/2020 0828   APPEARANCEUR HAZY (A) 08/28/2020 0828   LABSPEC 1.015 08/28/2020 0828   PHURINE 5.0 08/28/2020 0828   GLUCOSEU NEGATIVE 08/28/2020 0828   HGBUR SMALL (A) 08/28/2020 0828   BILIRUBINUR NEGATIVE 08/28/2020 0828   KETONESUR NEGATIVE 08/28/2020 0828   PROTEINUR 100 (A) 08/28/2020 0828   NITRITE  NEGATIVE 08/28/2020 0828   LEUKOCYTESUR  NEGATIVE 08/28/2020 0828   Sepsis Labs Invalid input(s): PROCALCITONIN,  WBC,  LACTICIDVEN Microbiology Recent Results (from the past 240 hour(s))  Resp Panel by RT-PCR (Flu A&B, Covid) Nasopharyngeal Swab     Status: None   Collection Time: 05/29/21  6:37 PM   Specimen: Nasopharyngeal Swab; Nasopharyngeal(NP) swabs in vial transport medium  Result Value Ref Range Status   SARS Coronavirus 2 by RT PCR NEGATIVE NEGATIVE Final    Comment: (NOTE) SARS-CoV-2 target nucleic acids are NOT DETECTED.  The SARS-CoV-2 RNA is generally detectable in upper respiratory specimens during the acute phase of infection. The lowest concentration of SARS-CoV-2 viral copies this assay can detect is 138 copies/mL. A negative result does not preclude SARS-Cov-2 infection and should not be used as the sole basis for treatment or other patient management decisions. A negative result may occur with  improper specimen collection/handling, submission of specimen other than nasopharyngeal swab, presence of viral mutation(s) within the areas targeted by this assay, and inadequate number of viral copies(<138 copies/mL). A negative result must be combined with clinical observations, patient history, and epidemiological information. The expected result is Negative.  Fact Sheet for Patients:  EntrepreneurPulse.com.au  Fact Sheet for Healthcare Providers:  IncredibleEmployment.be  This test is no t yet approved or cleared by the [place] FDA and  has been authorized for detection and/or diagnosis of SARS-CoV-2 by FDA under an Emergency Use Authorization (EUA). This EUA will remain  in effect (meaning this test can be used) for the duration of the COVID-19 declaration under Section 564(b)(1) of the Act, 21 U.S.C.section 360bbb-3(b)(1), unless the authorization is terminated  or revoked sooner.       Influenza A by PCR NEGATIVE  NEGATIVE Final   Influenza B by PCR NEGATIVE NEGATIVE Final    Comment: (NOTE) The Xpert Xpress SARS-CoV-2/FLU/RSV plus assay is intended as an aid in the diagnosis of influenza from Nasopharyngeal swab specimens and should not be used as a sole basis for treatment. Nasal washings and aspirates are unacceptable for Xpert Xpress SARS-CoV-2/FLU/RSV testing.  Fact Sheet for Patients: EntrepreneurPulse.com.au  Fact Sheet for Healthcare Providers: IncredibleEmployment.be  This test is not yet approved or cleared by the [place] FDA and has been authorized for detection and/or diagnosis of SARS-CoV-2 by FDA under an Emergency Use Authorization (EUA). This EUA will remain in effect (meaning this test can be used) for the duration of the COVID-19 declaration under Section 564(b)(1) of the Act, 21 U.S.C. section 360bbb-3(b)(1), unless the authorization is terminated or revoked.  Performed at [facility], [address] [place], [place] [address]      Time coordinating discharge: Over 30 minutes  SIGNED:   Nolberto Hanlon, MD  [facility] 05/31/2021, 11:52 AM Pager   If 7PM-7AM, please contact night-coverage [email or web address] Password [email or web address]

## 2021-06-01 ENCOUNTER — Telehealth: Payer: Self-pay | Admitting: Physician Assistant

## 2021-06-01 NOTE — Telephone Encounter (Signed)
Patient was discharged yesterday and has some questions regarding her Amlodipine 13m and her beta blocker. Please call to discuss.

## 2021-06-01 NOTE — Telephone Encounter (Signed)
Returned the patients call. Lmtcb.

## 2021-06-04 NOTE — Telephone Encounter (Signed)
Spoke with patient and she inquired about seeing cardiology. She states that someone stated she did not have any cardiology needs. Reviewed that I do see note during her admission to follow up in one week. Her cardiologist is Dr. Eleonore Chiquito which is located in [place]. Will reach out to them regarding appointment then call patient back with this information.

## 2021-06-04 NOTE — Telephone Encounter (Signed)
Attempted to contact patient to offer appointment. Spoke with MD who reviewed and stated 2 week appointment time was fine.  LVM to call back.

## 2021-06-05 NOTE — Telephone Encounter (Signed)
Pt returning phone call ° °

## 2021-06-10 NOTE — Telephone Encounter (Signed)
Patient called back in today, she was advised of an appointment with Dr.O'Neal in the next 2 weeks.  Patient verbalized understanding. She will call if she has to move this appointment.

## 2021-06-25 ENCOUNTER — Ambulatory Visit: Payer: Medicare Other | Admitting: Family

## 2021-07-01 ENCOUNTER — Ambulatory Visit: Payer: Medicare Other | Admitting: Cardiovascular Disease

## 2021-07-01 NOTE — Progress Notes (Deleted)
Cardiology Office Note:   Date:  07/01/2021  NAME:  Monica Ellis    MRN: [id number] DOB:  December 15, 1960   PCP:  Ferd Hibbs, NP  Cardiologist:  Evalina Field, MD  Electrophysiologist:  None   Referring MD: Ferd Hibbs, NP   No chief complaint on file. ***  History of Present Illness:   Monica Ellis is a 60 y.o. female with a hx of HFpEF, pHTN, COPD/OSA, SLE, obesity, DM who presents for follow-up. Diagnosed with HFpEF 08/2020. Recent admission 05/2021 for HFpEF.   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   Brain tumor (HCC)    hx of   Diabetes mellitus without complication (HCC)    GERD (gastroesophageal reflux disease)    Hypertension    Lupus (Monica Ellis)    Morbid obesity ([place])    Obesity hypoventilation syndrome ([place])    Pneumonia    Tobacco use disorder     Past Surgical History: Past Surgical History:  Procedure Laterality Date   CHOLECYSTECTOMY     DILATION AND CURETTAGE OF UTERUS     EYE SURGERY Bilateral    laser surgery    IR TRANSCATHETER BX  11/03/2018   IR US GUIDE VASC ACCESS RIGHT  11/03/2018   IR VENOGRAM HEPATIC W HEMODYNAMIC EVALUATION  11/03/2018   RIGHT HEART CATH N/A 09/01/2020   Procedure: RIGHT HEART CATH;  Surgeon: Jolaine Artist, MD;  Location: [facility];  Service: Cardiovascular;  Laterality: N/A;    Current Medications: No outpatient medications have been marked as taking for the 07/01/21 encounter (Appointment) with O'Neal, Cassie Freer, MD.     Allergies:    Ace inhibitors, Imuran [azathioprine], Lisinopril, Protonix [pantoprazole], Tramadol, Iodinated diagnostic agents, Iodine, and Omeprazole   Social History: Social History    Socioeconomic History   Marital status: Single    Spouse name: Not on file   Number of children: 2   Years of education: Not on file   Highest education level: Bachelor's degree (e.g., BA, AB, BS)  Occupational History    Comment: disabled  Tobacco Use   Smoking status: Every Day    Packs/day: 1.00    Years: 30.00    Pack years: 30.00    Types: Cigarettes   Smokeless tobacco: Never  Vaping Use   Vaping Use: Never used  Substance and Sexual Activity   Alcohol use: No   Drug use: No   Sexual activity: Not on file  Other Topics Concern   Not on file  Social History Narrative   Lives with mother   Caffeine- Diet Coke, 1 Liter   Social Determinants of Health   Financial Resource Strain: Not on file  Food Insecurity: Not on file  Transportation Needs: Not on file  Physical Activity: Not on file  Stress: Not on file  Social Connections: Not on file     Family History: The patient's ***family history includes Cancer in her maternal uncle; Diabetes in her father; Hypertension in her father and mother; Kidney cancer in her maternal uncle.  ROS:   All other ROS reviewed and negative. Pertinent positives noted in the HPI.     EKGs/Labs/Other Studies Reviewed:   The following studies were personally reviewed by me today:  EKG:  EKG is *** ordered today.  The ekg ordered today demonstrates ***, and was personally reviewed by me.   RHC 09/01/2020 PA 80/35/51 PCW 26  TPG 25 PVR 3.5 WU  TTE 09/02/2020  1. Limited images- technically difficult, Definity not given   2. LV E' velocity is low at 5-6 cm/sec. Left ventricular ejection  fraction, by estimation, is 55 to 60%. The left ventricle has normal  function. The left ventricle has no regional wall motion abnormalities.  There is moderate left ventricular  hypertrophy. Left ventricular diastolic parameters are consistent with  Grade II diastolic dysfunction (pseudonormalization). Elevated left  ventricular end-diastolic  pressure. The E/e' is 21.   3. Right ventricular systolic function is low normal. The right  ventricular size is mildly enlarged. Tricuspid regurgitation signal is  inadequate for assessing PA pressure.   4. The mitral valve was not well visualized. No evidence of mitral valve  regurgitation.   5. The aortic valve was not well visualized. Aortic valve regurgitation  is not visualized.   6. The inferior vena cava is normal in size with <50% respiratory  variability, suggesting right atrial pressure of 8 mmHg.   Recent Labs: 08/28/2020: TSH 0.736 09/03/2020: Magnesium 2.1 05/29/2021: ALT 58; B Natriuretic Peptide 122.2 05/30/2021: Hemoglobin 11.4; Platelets 205 05/31/2021: BUN 30; Creatinine, Ser 0.92; Potassium 4.0; Sodium 135   Recent Lipid Panel    Component Value Date/Time   CHOL 170 11/12/2019 1429   TRIG 293 (H) 11/12/2019 1429   HDL 48 (L) 11/12/2019 1429   CHOLHDL 3.5 11/12/2019 1429   LDLCALC 87 11/12/2019 1429    Physical Exam:   VS:  There were no vitals taken for this visit.   Wt Readings from Last 3 Encounters:  05/31/21 (!) 349 lb 14.4 oz (158.7 kg)  09/05/20 (!) 324 lb 1.2 oz (147 kg)  06/17/20 (!) 326 lb 14.4 oz (148.3 kg)    General: Well nourished, well developed, in no acute distress Head: Atraumatic, normal size  Eyes: PEERLA, EOMI  Neck: Supple, no JVD Endocrine: No thryomegaly Cardiac: Normal S1, S2; RRR; no murmurs, rubs, or gallops Lungs: Clear to auscultation bilaterally, no wheezing, rhonchi or rales  Abd: Soft, nontender, no hepatomegaly  Ext: No edema, pulses 2+ Musculoskeletal: No deformities, BUE and BLE strength normal and equal Skin: Warm and dry, no rashes   Neuro: Alert and oriented to person, place, time, and situation, CNII-XII grossly intact, no focal deficits  Psych: Normal mood and affect   ASSESSMENT:   [name] is a 60 y.o. female who presents for the following: No diagnosis found.  PLAN:   There are no diagnoses  linked to this encounter.  {Are you ordering a CV Procedure (e.g. stress test, cath, DCCV, TEE, etc)?   Press F2        :[id number]}  Disposition: No follow-ups on file.  Medication Adjustments/Labs and Tests Ordered: Current medicines are reviewed at length with the patient today.  Concerns regarding medicines are outlined above.  No orders of the defined types were placed in this encounter.  No orders of the defined types were placed in this encounter.   There are no Patient Instructions on file for this visit.   Time Spent with Patient: I have spent a total of *** minutes with patient reviewing hospital notes, telemetry, EKGs, labs and examining the patient as well as establishing an assessment and plan that was discussed with the patient.  > 50% of time was spent in  direct patient care.  Signed, Addison Naegeli. Audie Box, MD, [facility]  [address] [place], [place] [address] [phone]  07/01/2021 7:02 AM

## 2022-08-14 ENCOUNTER — Encounter (HOSPITAL_COMMUNITY): Payer: Self-pay | Admitting: Emergency Medicine

## 2022-08-14 ENCOUNTER — Emergency Department (HOSPITAL_COMMUNITY)
Admission: EM | Admit: 2022-08-14 | Discharge: 2022-08-15 | Disposition: A | Discharge status: Home / Self Care | Payer: Medicare Other | Attending: Emergency Medicine | Admitting: Emergency Medicine

## 2022-08-14 DIAGNOSIS — E119 Type 2 diabetes mellitus without complications: Secondary | ICD-10-CM | POA: Diagnosis not present

## 2022-08-14 DIAGNOSIS — Z20822 Contact with and (suspected) exposure to covid-19: Secondary | ICD-10-CM | POA: Diagnosis not present

## 2022-08-14 DIAGNOSIS — R197 Diarrhea, unspecified: Secondary | ICD-10-CM | POA: Diagnosis not present

## 2022-08-14 DIAGNOSIS — R110 Nausea: Secondary | ICD-10-CM | POA: Diagnosis present

## 2022-08-14 DIAGNOSIS — R519 Headache, unspecified: Secondary | ICD-10-CM | POA: Diagnosis not present

## 2022-08-14 DIAGNOSIS — I10 Essential (primary) hypertension: Secondary | ICD-10-CM | POA: Diagnosis not present

## 2022-08-14 DIAGNOSIS — R1084 Generalized abdominal pain: Secondary | ICD-10-CM | POA: Diagnosis not present

## 2022-08-14 DIAGNOSIS — F17210 Nicotine dependence, cigarettes, uncomplicated: Secondary | ICD-10-CM | POA: Diagnosis not present

## 2022-08-14 NOTE — ED Triage Notes (Signed)
Pt BIB [facility] from home. Reports nausea. States that she tried phenergan without relief. States her nausea has ever felt this way before. Hx of end stage liver disease.

## 2022-08-15 ENCOUNTER — Emergency Department (HOSPITAL_COMMUNITY): Payer: Medicare Other

## 2022-08-15 LAB — COMPREHENSIVE METABOLIC PANEL
ALT: 81 U/L — ABNORMAL HIGH (ref 0–44)
AST: 65 U/L — ABNORMAL HIGH (ref 15–41)
Albumin: 3.7 g/dL (ref 3.5–5.0)
Alkaline Phosphatase: 38 U/L (ref 38–126)
Anion gap: 7 (ref 5–15)
BUN: 18 mg/dL (ref 8–23)
CO2: 29 mmol/L (ref 22–32)
Calcium: 9.7 mg/dL (ref 8.9–10.3)
Chloride: 102 mmol/L (ref 98–111)
Creatinine, Ser: 0.86 mg/dL (ref 0.44–1.00)
GFR, Estimated: >60 mL/min (ref >60)
Glucose, Bld: 135 mg/dL — ABNORMAL HIGH (ref 70–99)
Potassium: 3.4 mmol/L — ABNORMAL LOW (ref 3.5–5.1)
Sodium: 138 mmol/L (ref 135–145)
Total Bilirubin: 0.5 mg/dL (ref 0.3–1.2)
Total Protein: 9.8 g/dL — ABNORMAL HIGH (ref 6.5–8.1)

## 2022-08-15 LAB — PROTIME-INR
INR: 1.1 (ref 0.8–1.2)
Prothrombin Time: 13.8 seconds (ref 11.4–15.2)

## 2022-08-15 LAB — CBC
HCT: 42.9 % (ref 36.0–46.0)
Hemoglobin: 13.4 g/dL (ref 12.0–15.0)
MCH: 30.5 pg (ref 26.0–34.0)
MCHC: 31.2 g/dL (ref 30.0–36.0)
MCV: 97.5 fL (ref 80.0–100.0)
Platelets: 183 10*3/uL (ref 150–400)
RBC: 4.4 MIL/uL (ref 3.87–5.11)
RDW: 14 % (ref 11.5–15.5)
WBC: 10.2 10*3/uL (ref 4.0–10.5)
nRBC: 0 % (ref 0.0–0.2)

## 2022-08-15 LAB — SARS CORONAVIRUS 2 BY RT PCR: SARS Coronavirus 2 by RT PCR: NEGATIVE

## 2022-08-15 LAB — LIPASE, BLOOD: Lipase: 27 U/L (ref 11–51)

## 2022-08-15 LAB — CBG MONITORING, ED: Glucose-Capillary: 188 mg/dL — ABNORMAL HIGH (ref 70–99)

## 2022-08-15 LAB — TROPONIN I (HIGH SENSITIVITY): Troponin I (High Sensitivity): 11 ng/L (ref <18)

## 2022-08-15 MED ORDER — ONDANSETRON 4 MG PO TBDP: 4.0000 mg | ORAL_TABLET | Freq: Once | ORAL | Status: DC

## 2022-08-15 MED ORDER — ONDANSETRON 4 MG PO TBDP
4.0000 mg | ORAL_TABLET | Freq: Three times a day (TID) | ORAL | Status: DC | PRN
  Administered 2022-08-15: 4 mg via ORAL
  Filled 2022-08-15: qty 1

## 2022-08-15 MED ORDER — PROMETHAZINE HCL 25 MG RE SUPP: 25.0000 mg | Freq: Four times a day (QID) | RECTAL | 0 refills | Status: DC | PRN

## 2022-08-15 MED ORDER — DIPHENHYDRAMINE HCL 50 MG/ML IJ SOLN
25.0000 mg | Freq: Once | INTRAMUSCULAR | Status: AC
  Administered 2022-08-15: 25 mg via INTRAVENOUS
  Filled 2022-08-15: qty 1

## 2022-08-15 MED ORDER — HYDROMORPHONE HCL 1 MG/ML IJ SOLN
0.5000 mg | Freq: Once | INTRAMUSCULAR | Status: AC
  Administered 2022-08-15: 0.5 mg via INTRAVENOUS
  Filled 2022-08-15: qty 1

## 2022-08-15 MED ORDER — HYDRALAZINE HCL 25 MG PO TABS
25.0000 mg | ORAL_TABLET | Freq: Once | ORAL | Status: AC
  Administered 2022-08-15: 25 mg via ORAL
  Filled 2022-08-15: qty 1

## 2022-08-15 MED ORDER — ONDANSETRON HCL 4 MG/2ML IJ SOLN
4.0000 mg | Freq: Once | INTRAMUSCULAR | Status: AC
  Administered 2022-08-15: 4 mg via INTRAVENOUS
  Filled 2022-08-15: qty 2

## 2022-08-15 MED ORDER — PROCHLORPERAZINE EDISYLATE 10 MG/2ML IJ SOLN
10.0000 mg | Freq: Once | INTRAMUSCULAR | Status: AC
  Administered 2022-08-15: 10 mg via INTRAVENOUS
  Filled 2022-08-15: qty 2

## 2022-08-15 MED ORDER — ONDANSETRON 4 MG PO TBDP: 4.0000 mg | ORAL_TABLET | Freq: Three times a day (TID) | ORAL | 0 refills | Status: AC | PRN

## 2022-08-15 MED ORDER — HYDRALAZINE HCL 20 MG/ML IJ SOLN
10.0000 mg | Freq: Once | INTRAMUSCULAR | Status: AC
  Administered 2022-08-15: 10 mg via INTRAVENOUS
  Filled 2022-08-15: qty 1

## 2022-08-15 NOTE — ED Provider Notes (Signed)
[facility] Emergency Department Provider Note MRN:  [id number]  Arrival date & time: 08/15/22     Chief Complaint   Nausea   History of Present Illness   Monica Ellis is a 61 y.o. year-old female with a history of hepatitis presenting to the ED with chief complaint of nausea.  Nausea with diffuse abdominal pain over the past day or 2.  Also noticed that her eyes are more yellow than normal.  Known history of autoimmune hepatitis related to lupus.  No fever, no vomiting, loose stool recently.  No chest pain or shortness of breath.  Review of Systems  A thorough review of systems was obtained and all systems are negative except as noted in the HPI and PMH.   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   Brain tumor ([place])    hx of   Diabetes mellitus without complication (HCC)    GERD (gastroesophageal reflux disease)    Hypertension    Lupus ([place])    Morbid obesity ([place])    Obesity hypoventilation syndrome ([place])    Pneumonia    Tobacco use disorder     Past Surgical History:  Procedure Laterality Date   CHOLECYSTECTOMY     DILATION AND CURETTAGE OF UTERUS     EYE SURGERY Bilateral    laser surgery    IR TRANSCATHETER BX  11/03/2018   IR US GUIDE VASC ACCESS RIGHT  11/03/2018   IR VENOGRAM HEPATIC W HEMODYNAMIC EVALUATION  11/03/2018   RIGHT HEART CATH N/A 09/01/2020   Procedure: RIGHT HEART CATH;  Surgeon: Jolaine Artist, MD;  Location: [facility];  Service: Cardiovascular;  Laterality: N/A;    Family History  Problem Relation Age of Onset   Diabetes Father    Hypertension Father    Hypertension Mother    Kidney cancer Maternal Uncle    Cancer Maternal Uncle        brain    Social History   Socioeconomic History   Marital status: Single    Spouse name: Not on file   Number of children: 2   Years of education: Not on file    Highest education level: Bachelor's degree (e.g., BA, AB, BS)  Occupational History    Comment: disabled  Tobacco Use   Smoking status: Every Day    Packs/day: 1.00    Years: 30.00    Total pack years: 30.00    Types: Cigarettes   Smokeless tobacco: Never  Vaping Use   Vaping Use: Never used  Substance and Sexual Activity   Alcohol use: No   Drug use: No   Sexual activity: Not on file  Other Topics Concern   Not on file  Social History Narrative   Lives with mother   Caffeine- Diet Coke, 1 Liter   Social Determinants of Health   Financial Resource Strain: Not on file  Food Insecurity: Not on file  Transportation Needs: Not on file  Physical Activity: Not on file  Stress: Not on file  Social Connections: Not on file  Intimate Partner Violence: Not on file     Physical Exam   Vitals:   08/15/22 0503 08/15/22 0528  BP: (!) 157/89   Pulse: 76   Resp: 19   Temp:  98.6 F (37 C)  SpO2: 96%     CONSTITUTIONAL: Well-appearing, NAD NEURO/PSYCH:  Alert and  oriented x 3, no focal deficits EYES:  eyes equal and reactive ENT/NECK:  no LAD, no JVD CARDIO: Regular rate, well-perfused, normal S1 and S2 PULM:  CTAB no wheezing or rhonchi GI/GU:  non-distended, non-tender MSK/SPINE:  No gross deformities, no edema SKIN:  no rash, atraumatic   *Additional and/or pertinent findings included in MDM below  Diagnostic and Interventional Summary    EKG Interpretation  Date/Time:  Saturday August 14 2022 23:59:04 EDT Ventricular Rate:  73 PR Interval:  155 QRS Duration: 94 QT Interval:  415 QTC Calculation: 422 R Axis:   80 Text Interpretation: Sinus rhythm Supraventricular bigeminy Nonspecific T abnormalities, lateral leads Confirmed by Gerlene Fee [phone]) on 08/15/2022 2:03:29 AM       Labs Reviewed  COMPREHENSIVE METABOLIC PANEL - Abnormal; Notable for the following components:      Result Value   Potassium 3.4 (*)    Glucose, Bld 135 (*)    Total Protein  9.8 (*)    AST 65 (*)    ALT 81 (*)    All other components within normal limits  CBC  LIPASE, BLOOD  PROTIME-INR  URINALYSIS, ROUTINE W REFLEX MICROSCOPIC  TROPONIN I (HIGH SENSITIVITY)  TROPONIN I (HIGH SENSITIVITY)    CT HEAD WO CONTRAST (5MM)  Final Result    CT ABDOMEN PELVIS WO CONTRAST  Final Result      Medications  HYDROmorphone (DILAUDID) injection 0.5 mg (0.5 mg Intravenous Given 08/15/22 0044)  ondansetron (ZOFRAN) injection 4 mg (4 mg Intravenous Given 08/15/22 0044)  hydrALAZINE (APRESOLINE) tablet 25 mg (25 mg Oral Given 08/15/22 0246)  diphenhydrAMINE (BENADRYL) injection 25 mg (25 mg Intravenous Given 08/15/22 0503)  prochlorperazine (COMPAZINE) injection 10 mg (10 mg Intravenous Given 08/15/22 0503)  hydrALAZINE (APRESOLINE) injection 10 mg (10 mg Intravenous Given 08/15/22 0503)     Procedures  /  Critical Care Procedures  ED Course and Medical Decision Making  Initial Impression and Ddx Abdominal pain, nausea, reportedly worsening jaundice, differential diagnosis includes gastroenteritis, biliary obstruction, awaiting labs, CT imaging.  Past medical/surgical history that increases complexity of ED encounter: Cirrhosis  Interpretation of Diagnostics I personally reviewed the EKG and my interpretation is as follows: Atrial bigeminy  Labs reveal minimally elevated LFTs, otherwise no significant blood count or electrolyte disturbance.  Patient Reassessment and Ultimate Disposition/Management Clinical Course as of 08/15/22 3491  Sun Aug 15, 2022  7915 Work-up overall unremarkable no obvious reason for patient's nausea.  Patient is persistently hypertensive, has a history of brain tumor, will add on CT head, troponin to evaluate for elevated ICP, endorgan damage related to hypertensive urgency. [MB]    Clinical Course User Index [MB] Maudie Flakes, MD     CT head is normal, troponin is negative, blood pressure is much improved after hydralazine.  No  indication for further testing or admission.  Patient is feeling well enough to go home, will return if symptoms worsen.  Patient management required discussion with the following services or consulting groups:  None  Complexity of Problems Addressed Acute illness or injury that poses threat of life of bodily function  Additional Data Reviewed and Analyzed Further history obtained from: None  Additional Factors Impacting ED Encounter Risk Consideration of hospitalization  Barth Kirks. Sedonia Small, MD [place] [email or web address]  Final Clinical Impressions(s) / ED Diagnoses     ICD-10-CM   1. Nausea  R11.0     2. Diarrhea, unspecified type  R19.7  ED Discharge Orders          Ordered    ondansetron (ZOFRAN-ODT) 4 MG disintegrating tablet  Every 8 hours PRN        08/15/22 0642    promethazine (PHENERGAN) 25 MG suppository  Every 6 hours PRN        08/15/22 0479             Discharge Instructions Discussed with and Provided to Patient:   Discharge Instructions   None      Maudie Flakes, MD 08/15/22 [phone]

## 2022-08-15 NOTE — ED Notes (Signed)
Pt assisted to BR by wheelchair

## 2022-08-15 NOTE — ED Notes (Signed)
Daughter Pearla Dubonnet [phone] called concerned about her mother being discharged with her bp elevated, History of stroke, daughter would like PCP to call and talk with , also requesting admission to hospital. Daughter requested information for [facility] RN-AC which was provided (call operator and request to speak with [facility]). Daughter would like a call back from PCP. He will be updated on conversation.

## 2022-08-15 NOTE — ED Notes (Signed)
Pt return from CT scanner, alert, NAD noted.

## 2022-08-15 NOTE — ED Notes (Signed)
Pt transported to CT

## 2022-08-15 NOTE — ED Notes (Signed)
Pt taken to restroom with wheelchair assistance.

## 2022-08-15 NOTE — ED Notes (Signed)
Pt returned from CT.

## 2022-08-15 NOTE — ED Notes (Signed)
Pt to CT scanner at this time

## 2022-08-15 NOTE — ED Notes (Signed)
Pt able to tolerate POs without vomiting. States was slightly nauseated prior, MD made aware. PO zofran given.

## 2022-08-15 NOTE — ED Provider Notes (Addendum)
Nurse asked me to see patient as she is concerned about going home.  After discussion with patient, she is still nauseated though not vomiting.  She states she is having diarrhea though nothing is currently coming out.  She states that due to a recent issue with her house, she is having to sleep on air mattresses which her mom has to help her blow up due to her comorbidities.  She is concerned that she cannot really care for herself in this current condition since she is still feeling sick.  I discussed that looking through her work-up, it appears that there is no emergent condition though if symptoms are to recur or worsen she should return.  However with her work-up and no emergent conditions found, I do not think that keeping her in the hospital is warranted or indicated.  She is concerned because she lives with her elderly mom and have a hard time caring for each other but this has also been going on for quite some time.  Offered treatment for diarrhea but she declines.  She would like a COVID test however which will be ordered.     Sherwood Gambler, MD 08/15/22 [phone]  9:54 AM I talked to the patient's daughter, Ilona Sorrel over the phone. She is asking for patient to be admitted for observation.  Discussed that while patient does have significant comorbidities, there is no clear indication to admit her overnight as her renal function is normal, liver function is stable with normal bilirubin, and no other emergent findings.  She does have hypertensive urgency but no signs of hypertensive emergency.  Her daughter's primary concern is the high blood pressure.  Patient has declined any treatments for her diarrhea here and has declined for me to give her her home morning meds.  She has been tolerating oral fluids.  It does seem like she is sick in the sense that she has a GI illness but I do not think she needs any criteria to be admitted.  10:29 AM Patient's covid test is negative.  She was given p.o.  Zofran.  She is tolerating oral fluids and feels well.  Has not had diarrhea for a few hours.  She reports the headache is gone.    Sherwood Gambler, MD 08/15/22 1525

## 2022-08-15 NOTE — ED Notes (Signed)
[facility] called to arrange transport back to pt's home, pt requires continuous oxygen and needs assistance with ambulating

## 2022-08-15 NOTE — ED Notes (Signed)
Pt provided with water per MD approval.

## 2022-08-15 NOTE — Discharge Instructions (Signed)
You were evaluated in the Emergency Department and after careful evaluation, we did not find any emergent condition requiring admission or further testing in the hospital.  Your exam/testing today is overall reassuring.  Use the Zofran and/or Phenergan as needed for nausea.  Please return to the Emergency Department if you experience any worsening of your condition.   Thank you for allowing [place] to be a part of your care.

## 2022-08-15 NOTE — ED Notes (Signed)
Secure message sent to Dr. Sedonia Small requesting medication for pt's SBP 190s, awaiting response

## 2022-08-15 NOTE — ED Notes (Signed)
Pt expressed concerns going home. MD Tyron Russell made aware. MD at bedside to speak to pt regarding concerns. Pt also expressed concern for possible flu or COVID, test to be ordered.

## 2022-09-03 ENCOUNTER — Inpatient Hospital Stay (HOSPITAL_COMMUNITY): Payer: Medicare Other

## 2022-09-03 ENCOUNTER — Encounter (HOSPITAL_COMMUNITY): Payer: Self-pay

## 2022-09-03 ENCOUNTER — Inpatient Hospital Stay (HOSPITAL_COMMUNITY)
Admission: EM | Admit: 2022-09-03 | Discharge: 2022-09-05 | DRG: 065 | Disposition: A | Discharge status: Home / Self Care | Payer: Medicare Other | Attending: Internal Medicine | Admitting: Internal Medicine

## 2022-09-03 ENCOUNTER — Emergency Department (HOSPITAL_COMMUNITY): Payer: Medicare Other

## 2022-09-03 ENCOUNTER — Other Ambulatory Visit: Payer: Self-pay

## 2022-09-03 DIAGNOSIS — Z1152 Encounter for screening for COVID-19: Secondary | ICD-10-CM | POA: Diagnosis not present

## 2022-09-03 DIAGNOSIS — G43909 Migraine, unspecified, not intractable, without status migrainosus: Secondary | ICD-10-CM | POA: Diagnosis present

## 2022-09-03 DIAGNOSIS — E119 Type 2 diabetes mellitus without complications: Secondary | ICD-10-CM | POA: Diagnosis present

## 2022-09-03 DIAGNOSIS — I63312 Cerebral infarction due to thrombosis of left middle cerebral artery: Secondary | ICD-10-CM | POA: Diagnosis not present

## 2022-09-03 DIAGNOSIS — R29810 Facial weakness: Secondary | ICD-10-CM | POA: Diagnosis present

## 2022-09-03 DIAGNOSIS — Z9981 Dependence on supplemental oxygen: Secondary | ICD-10-CM

## 2022-09-03 DIAGNOSIS — K754 Autoimmune hepatitis: Secondary | ICD-10-CM | POA: Diagnosis present

## 2022-09-03 DIAGNOSIS — Z833 Family history of diabetes mellitus: Secondary | ICD-10-CM | POA: Diagnosis not present

## 2022-09-03 DIAGNOSIS — Z86011 Personal history of benign neoplasm of the brain: Secondary | ICD-10-CM | POA: Diagnosis not present

## 2022-09-03 DIAGNOSIS — Z8616 Personal history of COVID-19: Secondary | ICD-10-CM | POA: Diagnosis not present

## 2022-09-03 DIAGNOSIS — Z794 Long term (current) use of insulin: Secondary | ICD-10-CM

## 2022-09-03 DIAGNOSIS — J9611 Chronic respiratory failure with hypoxia: Secondary | ICD-10-CM | POA: Diagnosis present

## 2022-09-03 DIAGNOSIS — I10 Essential (primary) hypertension: Secondary | ICD-10-CM | POA: Diagnosis present

## 2022-09-03 DIAGNOSIS — Z7952 Long term (current) use of systemic steroids: Secondary | ICD-10-CM

## 2022-09-03 DIAGNOSIS — I639 Cerebral infarction, unspecified: Secondary | ICD-10-CM | POA: Diagnosis present

## 2022-09-03 DIAGNOSIS — R471 Dysarthria and anarthria: Secondary | ICD-10-CM | POA: Diagnosis present

## 2022-09-03 DIAGNOSIS — J9811 Atelectasis: Secondary | ICD-10-CM | POA: Diagnosis present

## 2022-09-03 DIAGNOSIS — M1990 Unspecified osteoarthritis, unspecified site: Secondary | ICD-10-CM | POA: Diagnosis present

## 2022-09-03 DIAGNOSIS — Z888 Allergy status to other drugs, medicaments and biological substances status: Secondary | ICD-10-CM

## 2022-09-03 DIAGNOSIS — K219 Gastro-esophageal reflux disease without esophagitis: Secondary | ICD-10-CM | POA: Diagnosis present

## 2022-09-03 DIAGNOSIS — Z79899 Other long term (current) drug therapy: Secondary | ICD-10-CM

## 2022-09-03 DIAGNOSIS — R7401 Elevation of levels of liver transaminase levels: Secondary | ICD-10-CM | POA: Diagnosis present

## 2022-09-03 DIAGNOSIS — Z6841 Body Mass Index (BMI) 40.0 and over, adult: Secondary | ICD-10-CM | POA: Diagnosis not present

## 2022-09-03 DIAGNOSIS — Z91041 Radiographic dye allergy status: Secondary | ICD-10-CM

## 2022-09-03 DIAGNOSIS — E785 Hyperlipidemia, unspecified: Secondary | ICD-10-CM | POA: Diagnosis present

## 2022-09-03 DIAGNOSIS — M329 Systemic lupus erythematosus, unspecified: Secondary | ICD-10-CM | POA: Diagnosis present

## 2022-09-03 DIAGNOSIS — F17210 Nicotine dependence, cigarettes, uncomplicated: Secondary | ICD-10-CM | POA: Diagnosis present

## 2022-09-03 DIAGNOSIS — R001 Bradycardia, unspecified: Secondary | ICD-10-CM | POA: Diagnosis present

## 2022-09-03 DIAGNOSIS — Z8051 Family history of malignant neoplasm of kidney: Secondary | ICD-10-CM

## 2022-09-03 DIAGNOSIS — R030 Elevated blood-pressure reading, without diagnosis of hypertension: Secondary | ICD-10-CM | POA: Diagnosis present

## 2022-09-03 DIAGNOSIS — Z8249 Family history of ischemic heart disease and other diseases of the circulatory system: Secondary | ICD-10-CM | POA: Diagnosis not present

## 2022-09-03 DIAGNOSIS — E662 Morbid (severe) obesity with alveolar hypoventilation: Secondary | ICD-10-CM | POA: Diagnosis present

## 2022-09-03 DIAGNOSIS — Z885 Allergy status to narcotic agent status: Secondary | ICD-10-CM

## 2022-09-03 DIAGNOSIS — G8929 Other chronic pain: Secondary | ICD-10-CM | POA: Diagnosis present

## 2022-09-03 DIAGNOSIS — I6381 Other cerebral infarction due to occlusion or stenosis of small artery: Principal | ICD-10-CM | POA: Diagnosis present

## 2022-09-03 DIAGNOSIS — I6389 Other cerebral infarction: Secondary | ICD-10-CM | POA: Diagnosis not present

## 2022-09-03 DIAGNOSIS — K7460 Unspecified cirrhosis of liver: Secondary | ICD-10-CM | POA: Diagnosis present

## 2022-09-03 DIAGNOSIS — R29702 NIHSS score 2: Secondary | ICD-10-CM | POA: Diagnosis present

## 2022-09-03 LAB — URINALYSIS, ROUTINE W REFLEX MICROSCOPIC
Bilirubin Urine: NEGATIVE
Glucose, UA: NEGATIVE mg/dL
Hgb urine dipstick: NEGATIVE
Ketones, ur: NEGATIVE mg/dL
Leukocytes,Ua: NEGATIVE
Nitrite: NEGATIVE
Protein, ur: 100 mg/dL — AB
Specific Gravity, Urine: 1.015 (ref 1.005–1.030)
pH: 5 (ref 5.0–8.0)

## 2022-09-03 LAB — COMPREHENSIVE METABOLIC PANEL
ALT: 59 U/L — ABNORMAL HIGH (ref 0–44)
AST: 42 U/L — ABNORMAL HIGH (ref 15–41)
Albumin: 3.3 g/dL — ABNORMAL LOW (ref 3.5–5.0)
Alkaline Phosphatase: 37 U/L — ABNORMAL LOW (ref 38–126)
Anion gap: 6 (ref 5–15)
BUN: 16 mg/dL (ref 8–23)
CO2: 29 mmol/L (ref 22–32)
Calcium: 9 mg/dL (ref 8.9–10.3)
Chloride: 100 mmol/L (ref 98–111)
Creatinine, Ser: 0.9 mg/dL (ref 0.44–1.00)
GFR, Estimated: >60 mL/min (ref >60)
Glucose, Bld: 176 mg/dL — ABNORMAL HIGH (ref 70–99)
Potassium: 4.1 mmol/L (ref 3.5–5.1)
Sodium: 135 mmol/L (ref 135–145)
Total Bilirubin: 0.7 mg/dL (ref 0.3–1.2)
Total Protein: 9 g/dL — ABNORMAL HIGH (ref 6.5–8.1)

## 2022-09-03 LAB — I-STAT CHEM 8, ED
BUN: 16 mg/dL (ref 8–23)
Calcium, Ion: 1.05 mmol/L — ABNORMAL LOW (ref 1.15–1.40)
Chloride: 102 mmol/L (ref 98–111)
Creatinine, Ser: 0.8 mg/dL (ref 0.44–1.00)
Glucose, Bld: 177 mg/dL — ABNORMAL HIGH (ref 70–99)
HCT: 43 % (ref 36.0–46.0)
Hemoglobin: 14.6 g/dL (ref 12.0–15.0)
Potassium: 4.1 mmol/L (ref 3.5–5.1)
Sodium: 138 mmol/L (ref 135–145)
TCO2: 27 mmol/L (ref 22–32)

## 2022-09-03 LAB — CBC
HCT: 41.3 % (ref 36.0–46.0)
Hemoglobin: 13 g/dL (ref 12.0–15.0)
MCH: 30 pg (ref 26.0–34.0)
MCHC: 31.5 g/dL (ref 30.0–36.0)
MCV: 95.4 fL (ref 80.0–100.0)
Platelets: 174 10*3/uL (ref 150–400)
RBC: 4.33 MIL/uL (ref 3.87–5.11)
RDW: 13.9 % (ref 11.5–15.5)
WBC: 7.6 10*3/uL (ref 4.0–10.5)
nRBC: 0 % (ref 0.0–0.2)

## 2022-09-03 LAB — CBG MONITORING, ED
Glucose-Capillary: 136 mg/dL — ABNORMAL HIGH (ref 70–99)
Glucose-Capillary: 184 mg/dL — ABNORMAL HIGH (ref 70–99)

## 2022-09-03 LAB — RESP PANEL BY RT-PCR (FLU A&B, COVID) ARPGX2
Influenza A by PCR: NEGATIVE
Influenza B by PCR: NEGATIVE
SARS Coronavirus 2 by RT PCR: NEGATIVE

## 2022-09-03 LAB — DIFFERENTIAL
Abs Immature Granulocytes: 0.03 10*3/uL (ref 0.00–0.07)
Basophils Absolute: 0.1 10*3/uL (ref 0.0–0.1)
Basophils Relative: 1 %
Eosinophils Absolute: 0.1 10*3/uL (ref 0.0–0.5)
Eosinophils Relative: 1 %
Immature Granulocytes: 0 %
Lymphocytes Relative: 21 %
Lymphs Abs: 1.6 10*3/uL (ref 0.7–4.0)
Monocytes Absolute: 0.5 10*3/uL (ref 0.1–1.0)
Monocytes Relative: 7 %
Neutro Abs: 5.3 10*3/uL (ref 1.7–7.7)
Neutrophils Relative %: 70 %

## 2022-09-03 LAB — RAPID URINE DRUG SCREEN, HOSP PERFORMED
Amphetamines: NOT DETECTED
Barbiturates: NOT DETECTED
Benzodiazepines: NOT DETECTED
Cocaine: NOT DETECTED
Opiates: NOT DETECTED
Tetrahydrocannabinol: NOT DETECTED

## 2022-09-03 LAB — HEMOGLOBIN A1C
Hgb A1c MFr Bld: 5.6 % (ref 4.8–5.6)
Mean Plasma Glucose: 114.02 mg/dL

## 2022-09-03 LAB — LIPID PANEL
Cholesterol: 123 mg/dL (ref 0–200)
HDL: 46 mg/dL (ref >40)
LDL Cholesterol: 61 mg/dL (ref 0–99)
Total CHOL/HDL Ratio: 2.7 RATIO
Triglycerides: 82 mg/dL (ref <150)
VLDL: 16 mg/dL (ref 0–40)

## 2022-09-03 LAB — APTT: aPTT: 30 seconds (ref 24–36)

## 2022-09-03 LAB — HIV ANTIBODY (ROUTINE TESTING W REFLEX): HIV Screen 4th Generation wRfx: REACTIVE — AB

## 2022-09-03 LAB — ETHANOL: Alcohol, Ethyl (B): <10 mg/dL (ref <10)

## 2022-09-03 LAB — PROTIME-INR
INR: 1.1 (ref 0.8–1.2)
Prothrombin Time: 13.6 seconds (ref 11.4–15.2)

## 2022-09-03 LAB — TSH: TSH: 0.725 u[IU]/mL (ref 0.350–4.500)

## 2022-09-03 MED ORDER — ACETAMINOPHEN 325 MG PO TABS: 650.0000 mg | ORAL_TABLET | Freq: Four times a day (QID) | ORAL | Status: DC | PRN

## 2022-09-03 MED ORDER — ASPIRIN 300 MG RE SUPP
300.0000 mg | Freq: Every day | RECTAL | Status: DC
  Filled 2022-09-03: qty 1

## 2022-09-03 MED ORDER — GADOPICLENOL 0.5 MMOL/ML IV SOLN
10.0000 mL | Freq: Once | INTRAVENOUS | Status: AC | PRN
  Administered 2022-09-03: 10 mL via INTRAVENOUS

## 2022-09-03 MED ORDER — INSULIN GLARGINE-YFGN 100 UNIT/ML ~~LOC~~ SOLN
15.0000 [IU] | Freq: Every day | SUBCUTANEOUS | Status: DC
  Administered 2022-09-04 (×2): 15 [IU] via SUBCUTANEOUS
  Filled 2022-09-03 (×4): qty 0.15

## 2022-09-03 MED ORDER — ONDANSETRON HCL 4 MG/2ML IJ SOLN: 4.0000 mg | Freq: Four times a day (QID) | INTRAMUSCULAR | Status: DC | PRN

## 2022-09-03 MED ORDER — ACETAMINOPHEN 650 MG RE SUPP: 650.0000 mg | Freq: Four times a day (QID) | RECTAL | Status: DC | PRN

## 2022-09-03 MED ORDER — MYCOPHENOLATE SODIUM 180 MG PO TBEC
1080.0000 mg | DELAYED_RELEASE_TABLET | Freq: Two times a day (BID) | ORAL | Status: DC
  Administered 2022-09-04 – 2022-09-05 (×4): 1080 mg via ORAL
  Filled 2022-09-03 (×6): qty 6

## 2022-09-03 MED ORDER — ONDANSETRON HCL 4 MG PO TABS
4.0000 mg | ORAL_TABLET | Freq: Four times a day (QID) | ORAL | Status: DC | PRN
  Administered 2022-09-04 – 2022-09-05 (×2): 4 mg via ORAL
  Filled 2022-09-03 (×2): qty 1

## 2022-09-03 MED ORDER — NICOTINE 14 MG/24HR TD PT24
14.0000 mg | MEDICATED_PATCH | Freq: Every day | TRANSDERMAL | Status: DC
  Administered 2022-09-03 – 2022-09-05 (×3): 14 mg via TRANSDERMAL
  Filled 2022-09-03 (×3): qty 1

## 2022-09-03 MED ORDER — OXYCODONE HCL 5 MG PO TABS
10.0000 mg | ORAL_TABLET | Freq: Four times a day (QID) | ORAL | Status: DC | PRN
  Administered 2022-09-03 – 2022-09-05 (×5): 10 mg via ORAL
  Filled 2022-09-03 (×5): qty 2

## 2022-09-03 MED ORDER — ASPIRIN 81 MG PO CHEW
81.0000 mg | CHEWABLE_TABLET | Freq: Every day | ORAL | Status: DC
  Administered 2022-09-03 – 2022-09-05 (×3): 81 mg via ORAL
  Filled 2022-09-03 (×3): qty 1

## 2022-09-03 MED ORDER — PREDNISONE 5 MG PO TABS
10.0000 mg | ORAL_TABLET | Freq: Every day | ORAL | Status: DC
  Administered 2022-09-04 – 2022-09-05 (×2): 10 mg via ORAL
  Filled 2022-09-03 (×2): qty 2

## 2022-09-03 MED ORDER — CLOPIDOGREL BISULFATE 75 MG PO TABS
75.0000 mg | ORAL_TABLET | Freq: Every day | ORAL | Status: DC
  Administered 2022-09-03 – 2022-09-05 (×3): 75 mg via ORAL
  Filled 2022-09-03 (×4): qty 1

## 2022-09-03 MED ORDER — INSULIN ASPART 100 UNIT/ML IJ SOLN
0.0000 [IU] | Freq: Three times a day (TID) | INTRAMUSCULAR | Status: DC
  Administered 2022-09-03: 4 [IU] via SUBCUTANEOUS
  Administered 2022-09-04 (×2): 7 [IU] via SUBCUTANEOUS
  Administered 2022-09-04: 3 [IU] via SUBCUTANEOUS
  Administered 2022-09-05 (×2): 4 [IU] via SUBCUTANEOUS

## 2022-09-03 NOTE — Consult Note (Signed)
Neurology Consultation  Reason for Consult: Stroke on MRI Referring Physician: Dr. Rogene Houston  CC: Right jaw numbness, slurred speech  History is obtained from: Patient, chart review  HPI: Monica Ellis is a 61 y.o. female with a medical history significant for essential hypertension, lupus, morbid obesity, obesity hypoventilation syndrome, autoimmune hepatitis, type 2 diabetes mellitus, and history of two small meningiomas along the posterior left frontal convexity and parietal lobe incidentally diagnosed in 2017 during evaluation of migraine headaches who presented to the ED on 9/29 for evaluation of right jaw sensory deficit and slurred speech.  Patient states that she went to bed in her usual state of health yesterday on 9/28 at 20:00.  She woke up at 23:00 stating that her jaw felt funny and felt she was having trouble speaking but she went back to bed.  She woke up at 6:00 this morning with continued right-sided jaw numbness and tingling and called her brother who also felt that her speech was abnormal and subsequently activated EMS around 10:00 AM.  She states that her jaw has decreased sensation, tingling, and often feels like her cheek is stuck in between her teeth while speaking.  She endorses increased stress over her living situation and recently losing a close friend of 30 years unexpectedly.  Patient underwent MRI imaging which reveals an acute left corona radiata infarction and neurology was consulted for further evaluation.  Of note, patient states that she does have a history of left facial numbness and weakness starting in July 2020 that was still present on neurology evaluation in November 2020 but resolved shortly after with no MRI evidence of remote CVA.  ROS: A complete ROS was performed and is negative except as noted in the HPI.   Past Medical History:  Diagnosis Date   Autoimmune hepatitis ([place])    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   Brain tumor ([place])    hx of    Diabetes mellitus without complication (HCC)    GERD (gastroesophageal reflux disease)    Hypertension    Lupus ([place])    Morbid obesity ([place])    Obesity hypoventilation syndrome ([place])    Pneumonia    Tobacco use disorder    Past Surgical History:  Procedure Laterality Date   CHOLECYSTECTOMY     DILATION AND CURETTAGE OF UTERUS     EYE SURGERY Bilateral    laser surgery    IR TRANSCATHETER BX  11/03/2018   IR US GUIDE VASC ACCESS RIGHT  11/03/2018   IR VENOGRAM HEPATIC W HEMODYNAMIC EVALUATION  11/03/2018   RIGHT HEART CATH N/A 09/01/2020   Procedure: RIGHT HEART CATH;  Surgeon: Jolaine Artist, MD;  Location: [facility];  Service: Cardiovascular;  Laterality: N/A;   Family History  Problem Relation Age of Onset   Diabetes Father    Hypertension Father    Hypertension Mother    Kidney cancer Maternal Uncle    Cancer Maternal Uncle        brain   Social History:   reports that she has been smoking cigarettes. She has a 30.00 pack-year smoking history. She has never used smokeless tobacco. She reports that she does not drink alcohol and does not use drugs.  Medications No current facility-administered medications for this encounter.  Current Outpatient Medications:    ascorbic acid (VITAMIN C) 500 MG tablet, Take 2,000 mg by mouth daily., Disp: , Rfl:    buPROPion (WELLBUTRIN XL) 150 MG 24 hr tablet, Take 150 mg by  mouth in the morning and at bedtime., Disp: , Rfl:    Cholecalciferol 10 MCG (400 UNIT) CAPS, Take 800 Units by mouth daily., Disp: , Rfl:    DULoxetine (CYMBALTA) 60 MG capsule, Take 60 mg by mouth at bedtime. , Disp: , Rfl:    fexofenadine-pseudoephedrine (ALLEGRA-D 24) 180-240 MG 24 hr tablet, Take 1 tablet by mouth daily., Disp: , Rfl:    insulin lispro protamine-lispro (HUMALOG 75/25 MIX) (75-25) 100 UNIT/ML SUSP injection, Inject 120 Units into the skin 3 (three) times daily with meals., Disp: , Rfl:    lactulose (CHRONULAC) 10 GM/15ML solution,  Take 30 mLs by mouth 3 (three) times daily as needed for constipation., Disp: , Rfl:    LORazepam (ATIVAN) 0.5 MG tablet, Take 0.5 mg by mouth 3 (three) times daily as needed for anxiety., Disp: , Rfl:    magnesium 30 MG tablet, Take 30 mg by mouth at bedtime. , Disp: , Rfl:    Multiple Vitamin (MULTIVITAMIN) tablet, Take 1 tablet by mouth daily. , Disp: , Rfl:    mycophenolate (MYFORTIC) 360 MG TBEC EC tablet, Take 1,080 mg by mouth 2 (two) times daily., Disp: , Rfl:    nadolol (CORGARD) 80 MG tablet, Take 1 tablet (80 mg total) by mouth daily., Disp: 30 tablet, Rfl: 0   NASAL SALINE NA, Place 1 spray into the nose daily as needed (Congestion and Allergies)., Disp: , Rfl:    Omega-3 Fatty Acids (FISH OIL PO), Take 1 capsule by mouth every evening. , Disp: , Rfl:    ondansetron (ZOFRAN-ODT) 4 MG disintegrating tablet, Take 1 tablet (4 mg total) by mouth every 8 (eight) hours as needed for nausea or vomiting., Disp: 20 tablet, Rfl: 0   oxyCODONE (OXYCONTIN) 10 mg 12 hr tablet, Take 10 mg by mouth every 6 (six) hours as needed for pain., Disp: , Rfl:    potassium chloride SA (KLOR-CON) 20 MEQ tablet, Take 2 tablets (40 mEq total) by mouth daily., Disp: 60 tablet, Rfl: 0   predniSONE (DELTASONE) 5 MG tablet, Take 10 mg by mouth daily with breakfast., Disp: , Rfl:    promethazine (PHENERGAN) 25 MG suppository, Place 1 suppository (25 mg total) rectally every 6 (six) hours as needed for nausea or vomiting., Disp: 12 each, Rfl: 0   spironolactone (ALDACTONE) 25 MG tablet, Take 1 tablet (25 mg total) by mouth daily., Disp: 30 tablet, Rfl: 0   torsemide 40 MG TABS, Take 40 mg by mouth 2 (two) times daily., Disp: 60 tablet, Rfl: 0   TURMERIC PO, Take 400 mg by mouth daily. , Disp: , Rfl:    zolpidem (AMBIEN) 10 MG tablet, Take 10 mg by mouth at bedtime., Disp: , Rfl:   Exam: Current vital signs: BP 133/62   Pulse (!) 51   Temp 98.9 F (37.2 C) (Oral)   Resp 10   Ht _0  (1.676 m)   Wt (!) 142.9 kg    SpO2 97%   BMI 50.84 kg/m  Vital signs in last 24 hours: Temp:  [98.9 F (37.2 C)] 98.9 F (37.2 C) (09/29 1141) Pulse Rate:  [51-54] 51 (09/29 1215) Resp:  [10-12] 10 (09/29 1215) BP: (133-147)/(62) 133/62 (09/29 1215) SpO2:  [97 %-100 %] 97 % (09/29 1231) Weight:  [142.9 kg] 142.9 kg (09/29 1138)  GENERAL: Awake, alert, laying in ER stretcher in no acute distress Psych: Affect appropriate for situation, patient is calm and cooperative with examination Head: Normocephalic and atraumatic, without obvious abnormality EENT:  Normal conjunctivae, dry mucous membranes, no OP obstruction LUNGS: Normal respiratory effort. Non-labored breathing on cannula.  No audible wheezing CV: Regular rate and rhythm on telemetry ABDOMEN: Soft, rounded, non-tender Extremities: Warm, well perfused, without obvious deformity  NEURO:  Mental Status: Awake, alert, and oriented to person, place, time, and situation. She is able to provide a clear and coherent history of present illness. Speech/Language: speech is mildly dysarthric though patient states she feels her dysarthria is significant. Naming, repetition, fluency, and comprehension intact without aphasia. No neglect is noted Cranial Nerves:  II: PERRL. Visual fields full.  III, IV, VI: EOMI without ptosis or nystagmus V: Decreased sensation to light touch on the lower right face compared to the left with additional tinging sensation on the right. Sensation to light touch is intact and symmetric on the forehead.   VII: Face is symmetric resting and with movement.  VIII: Hearing is intact to voice IX, X: Palate elevation is symmetric. Phonation normal.  XI: Normal sternocleidomastoid and trapezius muscle strength XII: Tongue protrudes midline without fasciculations.   Motor: 5/5 strength present in bilateral upper extremities.  Lower extremity assessment limited bilaterally due to chronic pain. Bilateral lower extremities are able to elevate  minimally against gravity without vertical drift without noted asymmetry.  Tone is normal. Bulk is normal.  Sensation: Intact to light touch bilaterally in all four extremities. No extinction to DSS present.  Coordination: FTN intact bilaterally. Pain limited evaluation of HKS bilaterally.  DTRs: Trace patellar  Gait: Deferred for patient safety; uses rollator walker at home and is able to minimally ambulate prior to sitting   NIHSS: 1a Level of Conscious.: 0 1b LOC Questions: 0 1c LOC Commands: 0 2 Best Gaze: 0 3 Visual: 0 4 Facial Palsy: 0 5a Motor Arm - left: 0 5b Motor Arm - Right: 0 6a Motor Leg - Left: 0 6b Motor Leg - Right: 0 7 Limb Ataxia: 0 8 Sensory: 1 9 Best Language: 0 10 Dysarthria: 1 11 Extinct. and Inatten.: 0 TOTAL: 2  Labs I have reviewed labs in epic and the results pertinent to this consultation are: CBC    Component Value Date/Time   WBC 7.6 09/03/2022 1223   RBC 4.33 09/03/2022 1223   HGB 14.6 09/03/2022 1232   HCT 43.0 09/03/2022 1232   PLT 174 09/03/2022 1223   MCV 95.4 09/03/2022 1223   MCH 30.0 09/03/2022 1223   MCHC 31.5 09/03/2022 1223   RDW 13.9 09/03/2022 1223   LYMPHSABS 1.6 09/03/2022 1223   MONOABS 0.5 09/03/2022 1223   EOSABS 0.1 09/03/2022 1223   BASOSABS 0.1 09/03/2022 1223   CMP     Component Value Date/Time   NA 138 09/03/2022 1232   K 4.1 09/03/2022 1232   CL 102 09/03/2022 1232   CO2 29 09/03/2022 1223   GLUCOSE 177 (H) 09/03/2022 1232   BUN 16 09/03/2022 1232   CREATININE 0.80 09/03/2022 1232   CREATININE 0.89 11/12/2019 1429   CALCIUM 9.0 09/03/2022 1223   PROT 9.0 (H) 09/03/2022 1223   ALBUMIN 3.3 (L) 09/03/2022 1223   AST 42 (H) 09/03/2022 1223   ALT 59 (H) 09/03/2022 1223   ALKPHOS 37 (L) 09/03/2022 1223   BILITOT 0.7 09/03/2022 1223   GFRNONAA >60 09/03/2022 1223   GFRNONAA 70 11/12/2019 1429   GFRAA >60 09/05/2020 0433   GFRAA 82 11/12/2019 1429   Lipid Panel     Component Value Date/Time   CHOL 170  11/12/2019 1429   TRIG  293 (H) 11/12/2019 1429   HDL 48 (L) 11/12/2019 1429   CHOLHDL 3.5 11/12/2019 1429   LDLCALC 87 11/12/2019 1429   Lab Results  Component Value Date   HGBA1C 5.3 05/30/2021   Imaging I have reviewed the images obtained:  MRI examination of the brain 9/29: Acute infarct in the corona radiata on the left. No evidence of hemorrhage.  Impression: 61 year old female with PMHx of HTN, lupus, morbid obesity, autoimmune hepatitis, DM2, migraine headaches, and two small meningiomas diagnosed in 2017 who presented to the ED on 9/29 for evaluation of right jaw decree sensation and tingling and slurred speech.  Patient's last known well was 20:00 on 9/28 and presented to the hospital outside of the thrombolytic therapy window.  Not on anticoagulation or antiplatelet medications at home.  MRI imaging reveals an acute left corona radiata infarction.  - Examination reveals patient with right lower face tingling and decreased sensation to light touch.  Patient states speech is dysarthric. - Patient not a candidate for TNK due to presenting outside of the thrombolytic therapy time window.  Not a candidate for IR due to no LVO. - MRI brain with acute infarct in the corona radiata on the left.  Vessel imaging pending. - Stroke risk factors include obesity, lupus, DM2, remote tobacco use. - Presentation and symptoms are consistent with the patient's acute left corona radiata stroke identified on MRI brain.  Will complete stroke work-up for further recommendations.  Recommendations: - HgbA1c, fasting lipid panel - MRA head and neck without contrast - Frequent neuro checks - Echocardiogram - Prophylactic therapy-Antiplatelet med: Aspirin - dose 327m PO or 3082mPR followed by ASA 81 mg PO daily in addition to clopidogrel 75 mg PO daily for 21 days followed by ASA monotherapy - Risk factor modification - Telemetry monitoring - PT consult, OT consult, Speech consult - Stroke team to  follow  Pt seen by NP/Neuro and later by MD. Note/plan to be edited by MD as needed.  StAnibal HendersonAGAC-NP [facility] Pager: [phone] ADDENDUM Performed a face to face diagnostic evaluation.   I have reviewed the contents of history and physical exam as documented by PA/ARNP/Resident and agree with above documentation.  I have discussed and formulated the above plan as documented. Edits to the note have been made as needed.  SaDonnetta SimpersMD [facility] [phone] If 7pm to 7am, please call on call as listed on AMION.

## 2022-09-03 NOTE — ED Notes (Signed)
EMS states coming from home. Woke up around 0600 and noticed slurring of speech and new onset of dizziness. Previous stroke with speech deficits. LKW 2000 09/02/2022. EMS states stroke screen neg. Daughter states speech sounds different as well.  B/P: 168/72 HR: 60 NSR 100% on 3lpm (Normally on o2) CBG: 117

## 2022-09-03 NOTE — ED Provider Notes (Signed)
61 year old [place] Provider Note   CSN: [id number] Arrival date & time: 09/03/22  1124     History  Chief Complaint  Patient presents with   Dizziness    Slurred speech    Monica Ellis is a 61 y.o. female.  61 year old female with history of prior CVA brought in by EMS, went to bed at 8pm, woke up around 11pm and didn't feel right, felt like her jaw was numb with trouble speaking but wasn't around anyone to discuss concern with. Woke up at 6am with right side jaw numbness, feeling confuse/light headed and didn't feel right. Called her brother who couldn't understand what she was saying and she had to slow down and concentrated on her words. Checked her BP and blood sugar, called EMS around 10am with concern for stroke. Called her PCP office who told her to get seen.  History of DM, lupus, HTN, GERD, hx of brain tumor, autoimmune hepatitis.        Home Medications Prior to Admission medications   Medication Sig Start Date End Date Taking? Authorizing Provider  ascorbic acid (VITAMIN C) 500 MG tablet Take 2,000 mg by mouth daily.    [provider]  buPROPion (WELLBUTRIN XL) 150 MG 24 hr tablet Take 150 mg by mouth in the morning and at bedtime. 05/21/20   [provider]  Cholecalciferol 10 MCG (400 UNIT) CAPS Take 800 Units by mouth daily.    [provider]  DULoxetine (CYMBALTA) 60 MG capsule Take 60 mg by mouth at bedtime.  08/26/16   [provider]  fexofenadine-pseudoephedrine (ALLEGRA-D 24) 180-240 MG 24 hr tablet Take 1 tablet by mouth daily.    [provider]  insulin lispro protamine-lispro (HUMALOG 75/25 MIX) (75-25) 100 UNIT/ML SUSP injection Inject 120 Units into the skin 3 (three) times daily with meals. 06/17/20   Domenic Polite, MD  lactulose (CHRONULAC) 10 GM/15ML solution Take 30 mLs by mouth 3 (three) times daily as needed for constipation. 11/22/19   [provider]  LORazepam (ATIVAN) 0.5 MG tablet Take 0.5 mg by mouth 3 (three) times daily as needed for anxiety. 08/27/20   [provider]  magnesium 30 MG tablet Take 30 mg by mouth at bedtime.     [provider]  Multiple Vitamin (MULTIVITAMIN) tablet Take 1 tablet by mouth daily.     [provider]  mycophenolate (MYFORTIC) 360 MG TBEC EC tablet Take 1,080 mg by mouth 2 (two) times daily. 05/13/20   [provider]  nadolol (CORGARD) 80 MG tablet Take 1 tablet (80 mg total) by mouth daily. 05/31/21 06/30/21  Nolberto Hanlon, MD  NASAL SALINE NA Place 1 spray into the nose daily as needed (Congestion and Allergies).    [provider]  Omega-3 Fatty Acids (FISH OIL PO) Take 1 capsule by mouth every evening.     [provider]  ondansetron (ZOFRAN-ODT) 4 MG disintegrating tablet Take 1 tablet (4 mg total) by mouth every 8 (eight) hours as needed for nausea or vomiting. 08/15/22   Maudie Flakes, MD  oxyCODONE (OXYCONTIN) 10 mg 12 hr tablet Take 10 mg by mouth every 6 (six) hours as needed for pain.    [provider]  potassium chloride SA (KLOR-CON) 20 MEQ tablet Take 2 tablets (40 mEq total) by mouth daily. 05/31/21 06/30/21  Nolberto Hanlon, MD  predniSONE (DELTASONE) 5 MG tablet Take 10 mg by mouth daily with  breakfast.    [provider]  promethazine (PHENERGAN) 25 MG suppository Place 1 suppository (25 mg total) rectally every 6 (six) hours as needed for nausea or vomiting. 08/15/22   Maudie Flakes, MD  spironolactone (ALDACTONE) 25 MG tablet Take 1 tablet (25 mg total) by mouth daily. 06/18/20   Domenic Polite, MD  torsemide 40 MG TABS Take 40 mg by mouth 2 (two) times daily. 05/31/21 06/30/21  Nolberto Hanlon, MD  TURMERIC PO Take 400 mg by mouth daily.     [provider]  zolpidem (AMBIEN) 10 MG tablet Take 10 mg by mouth at bedtime. 05/15/19   [provider]      Allergies    Ace inhibitors, Imuran  [azathioprine], Lisinopril, Protonix [pantoprazole], Tramadol, Iodinated contrast media, Iodine, and Omeprazole    Review of Systems   Review of Systems Negative except as per HPI Physical Exam Updated Vital Signs BP 133/62   Pulse (!) 51   Temp 98.9 F (37.2 C) (Oral)   Resp 10   Ht _0  (1.676 m)   Wt (!) 142.9 kg   SpO2 97%   BMI 50.84 kg/m  Physical Exam Vitals and nursing note reviewed.  Constitutional:      General: She is not in acute distress.    Appearance: She is well-developed. She is not diaphoretic.  HENT:     Head: Normocephalic and atraumatic.     Mouth/Throat:     Mouth: Mucous membranes are moist.  Eyes:     Extraocular Movements: Extraocular movements intact.     Pupils: Pupils are equal, round, and reactive to light.  Cardiovascular:     Rate and Rhythm: Normal rate and regular rhythm.     Pulses: Normal pulses.     Heart sounds: Normal heart sounds.  Pulmonary:     Effort: Pulmonary effort is normal.     Breath sounds: Normal breath sounds.  Abdominal:     Palpations: Abdomen is soft.     Tenderness: There is no abdominal tenderness.  Musculoskeletal:     Right lower leg: No edema.     Left lower leg: No edema.  Skin:    General: Skin is warm and dry.     Findings: No erythema or rash.  Neurological:     Mental Status: She is alert and oriented to person, place, and time.     GCS: GCS eye subscore is 4. GCS verbal subscore is 5. GCS motor subscore is 6.     Cranial Nerves: Facial asymmetry present.     Sensory: Sensory deficit present.     Motor: No weakness or pronator drift.     Coordination: Finger-Nose-Finger Test normal. Rapid alternating movements normal.     Comments: Diminished sensation to right upper extremity, no arm drift, speech slurred  Psychiatric:        Behavior: Behavior normal.     ED Results / Procedures / Treatments   Labs (all labs ordered are listed, but only abnormal results are displayed) Labs Reviewed   COMPREHENSIVE METABOLIC PANEL - Abnormal; Notable for the following components:      Result Value   Glucose, Bld 176 (*)    Total Protein 9.0 (*)    Albumin 3.3 (*)    AST 42 (*)    ALT 59 (*)    Alkaline Phosphatase 37 (*)    All other components within normal limits  I-STAT CHEM 8, ED - Abnormal; Notable for the following components:  Glucose, Bld 177 (*)    Calcium, Ion 1.05 (*)    All other components within normal limits  RESP PANEL BY RT-PCR (FLU A&B, COVID) ARPGX2  ETHANOL  PROTIME-INR  APTT  CBC  DIFFERENTIAL  RAPID URINE DRUG SCREEN, HOSP PERFORMED  URINALYSIS, ROUTINE W REFLEX MICROSCOPIC  LIPID PANEL  HEMOGLOBIN A1C    EKG EKG Interpretation  Date/Time:  Friday September 03 2022 11:41:39 EDT Ventricular Rate:  52 PR Interval:  153 QRS Duration: 83 QT Interval:  440 QTC Calculation: 410 R Axis:   64 Text Interpretation: Sinus rhythm Confirmed by Fredia Sorrow [phone]) on 09/03/2022 12:41:40 PM  Radiology MR BRAIN WO CONTRAST  Result Date: 09/03/2022 CLINICAL DATA:  Slurred speech EXAM: MRI HEAD WITHOUT CONTRAST TECHNIQUE: Multiplanar, multiecho pulse sequences of the brain and surrounding structures were obtained without intravenous contrast. COMPARISON:  MRI Brain 11/13/19 FINDINGS: Brain: Area of diffusion restriction in the corona radiata on the left (series 2, image 29) has a correlate on the ADC map and along the T2/FLAIR weighted sequences) and is worrisome for an acute infarct. Redemonstrated is a calcified meningioma along the left frontoparietal convexity. Sequela of moderate chronic microvascular ischemic change. Vascular: Normal flow voids. Skull and upper cervical spine: Normal marrow signal. Sinuses/Orbits: Small mucous retention cyst in the left maxillary sinus. Left-sided lens replacement. Other: None. IMPRESSION: Acute infarct in the corona radiata on the left. No evidence of hemorrhage. Findings were discussed with Dr. Percell Miller on 09/03/22 at 1:30  PM. Electronically Signed   By: Marin Roberts M.D.   On: 09/03/2022 13:32    Procedures Procedures    Medications Ordered in ED Medications  aspirin chewable tablet 81 mg (has no administration in time range)    Or  aspirin suppository 300 mg (has no administration in time range)  clopidogrel (PLAVIX) tablet 75 mg (has no administration in time range)    ED Course/ Medical Decision Making/ A&P                           Medical Decision Making Amount and/or Complexity of Data Reviewed Labs: ordered. Radiology: ordered.   This patient presents to the ED for concern of slurred speech, facial droop, altered sensation right arm, this involves an extensive number of treatment options, and is a complaint that carries with it a high risk of complications and morbidity.  The differential diagnosis includes but not limited to CVA, TIA, metabolic derangement, encephalopathy    Co morbidities that complicate the patient evaluation  CVA, lupus, hypertension, autoimmune hepatitis, diabetes, COPD, CHF   Additional history obtained:  Additional history obtained from EMS who assist with history as above External records from outside source obtained and reviewed including visit to [facility] on 04/20/2022 where patient has managed for her autoimmune hepatitis   Lab Tests:  I Ordered, and personally interpreted labs.  The pertinent results include: CBC unremarkable, INR normal, PTT and alcohol normal.  CMP with elevated glucose was nonfasting sample at 176.  Mildly elevated AST and ALT.   Imaging Studies ordered:  I ordered imaging studies including MRI brain I independently visualized and interpreted imaging which showed CVA I agree with the radiologist interpretation   Cardiac Monitoring: / EKG:  The patient was maintained on a cardiac monitor.  I personally viewed and interpreted the cardiac monitored which showed an underlying rhythm of: Sinus bradycardia, rate  52   Consultations Obtained:  I requested consultation with the  neuro hospitalist, Dr. Raliegh Ip,  and discussed lab and imaging findings as well as pertinent plan - they recommend: Neuro will consult, recommends hospitalist admit.  Discussed with hospitalist service will consult for admission   Problem List / ED Course / Critical interventions / Medication management  61 year old female brought in by EMS with concern for slurred speech, facial droop, right arm altered sensation.  Prior CVA.  Last known well 8 PM last night.  Is found to have CVA on MRI.  Labs without significant findings.  Discussed with neuro hospitalist who will consult, hospitalist to admit.  Discussed findings with patient. I have reviewed the patients home medicines and have made adjustments as needed   Social Determinants of Health:  Has out of network PCP   Test / Admission - Considered:  Admit for further management         Final Clinical Impression(s) / ED Diagnoses Final diagnoses:  Cerebrovascular accident (CVA), unspecified mechanism [facility])    Rx / DC Orders ED Discharge Orders     None         Tacy Learn, PA-C 09/03/22 1437    Fredia Sorrow, MD 09/14/22 1006

## 2022-09-03 NOTE — ED Notes (Signed)
Patient transported to MRI 

## 2022-09-03 NOTE — ED Triage Notes (Signed)
Patient states went to bed last night around 2100, woke up with some dizziness and slurred speech this morning.

## 2022-09-03 NOTE — ED Provider Notes (Signed)
I provided a substantive portion of the care of this patient.  I personally performed the entirety of the history, exam, and medical decision making for this encounter.  EKG Interpretation  Date/Time:  Friday September 03 2022 11:41:39 EDT Ventricular Rate:  52 PR Interval:  153 QRS Duration: 83 QT Interval:  440 QTC Calculation: 410 R Axis:   64 Text Interpretation: Sinus rhythm Confirmed by Fredia Sorrow [phone]) on 09/03/2022 12:41:40 PM   Patient seen by me along with physician assistant.  Patient went to bed last night at around 2000 and everything was fine.  Awoke at around 2300 and noted that she was having some speech problems.  That is persisted here to today.  Patient feels as if her lips and tongue are not working properly.  Denies any upper extremity lower extremity weakness or numbness or any visual problems.  Denies headache.  Patient states that she had a stroke several years ago she is not on any blood thinners.  CRITICAL CARE Performed by: Fredia Sorrow Total critical care time: 45 minutes Critical care time was exclusive of separately billable procedures and treating other patients. Critical care was necessary to treat or prevent imminent or life-threatening deterioration. Critical care was time spent personally by me on the following activities: development of treatment plan with patient and/or surrogate as well as nursing, discussions with consultants, evaluation of patient's response to treatment, examination of patient, obtaining history from patient or surrogate, ordering and performing treatments and interventions, ordering and review of laboratory studies, ordering and review of radiographic studies, pulse oximetry and re-evaluation of patient's condition.  Patient's basic labs without any acute problems.  Stroke order set was used but patient was out of the TNK window.  CBC without any abnormalities complete metabolic panel renal function GFR greater than 60 blood  sugar 176 potassium normal.  Patient with slight elevation of AST ALT and alk phos total bili is normal.  Anion gap normal.  COVID and flu testing negative.   We ordered head CT and MRI brain but head CT was behind schedule so MRI brain was done first shows an acute infarct in the corona radiata on the left no evidence of hemorrhage.  We will contact neuro hospitalist for consultation and probably medical admission.      Fredia Sorrow, MD 09/03/22 1344
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
## 2022-09-04 ENCOUNTER — Other Ambulatory Visit (HOSPITAL_COMMUNITY): Payer: Medicare Other

## 2022-09-04 ENCOUNTER — Inpatient Hospital Stay (HOSPITAL_COMMUNITY): Payer: Medicare Other

## 2022-09-04 DIAGNOSIS — I6389 Other cerebral infarction: Secondary | ICD-10-CM

## 2022-09-04 DIAGNOSIS — I63312 Cerebral infarction due to thrombosis of left middle cerebral artery: Secondary | ICD-10-CM

## 2022-09-04 DIAGNOSIS — I639 Cerebral infarction, unspecified: Secondary | ICD-10-CM | POA: Diagnosis not present

## 2022-09-04 LAB — GLUCOSE, CAPILLARY
Glucose-Capillary: 161 mg/dL — ABNORMAL HIGH (ref 70–99)
Glucose-Capillary: 216 mg/dL — ABNORMAL HIGH (ref 70–99)
Glucose-Capillary: 213 mg/dL — ABNORMAL HIGH (ref 70–99)
Glucose-Capillary: 120 mg/dL — ABNORMAL HIGH (ref 70–99)

## 2022-09-04 LAB — COMPREHENSIVE METABOLIC PANEL
ALT: 56 U/L — ABNORMAL HIGH (ref 0–44)
AST: 39 U/L (ref 15–41)
Albumin: 3.4 g/dL — ABNORMAL LOW (ref 3.5–5.0)
Alkaline Phosphatase: 30 U/L — ABNORMAL LOW (ref 38–126)
Anion gap: 6 (ref 5–15)
BUN: 13 mg/dL (ref 8–23)
CO2: 30 mmol/L (ref 22–32)
Calcium: 9 mg/dL (ref 8.9–10.3)
Chloride: 102 mmol/L (ref 98–111)
Creatinine, Ser: 0.84 mg/dL (ref 0.44–1.00)
GFR, Estimated: >60 mL/min (ref >60)
Glucose, Bld: 165 mg/dL — ABNORMAL HIGH (ref 70–99)
Potassium: 3.6 mmol/L (ref 3.5–5.1)
Sodium: 138 mmol/L (ref 135–145)
Total Bilirubin: 0.8 mg/dL (ref 0.3–1.2)
Total Protein: 8.6 g/dL — ABNORMAL HIGH (ref 6.5–8.1)

## 2022-09-04 LAB — BLOOD GAS, ARTERIAL
Acid-Base Excess: 6.1 mmol/L — ABNORMAL HIGH (ref 0.0–2.0)
Bicarbonate: 31.2 mmol/L — ABNORMAL HIGH (ref 20.0–28.0)
Drawn by: 41371
O2 Saturation: 75.2 %
Patient temperature: 37
pCO2 arterial: 46 mmHg (ref 32–48)
pH, Arterial: 7.44 (ref 7.35–7.45)
pO2, Arterial: 41 mmHg — ABNORMAL LOW (ref 83–108)

## 2022-09-04 LAB — CBC
HCT: 40 % (ref 36.0–46.0)
Hemoglobin: 12.9 g/dL (ref 12.0–15.0)
MCH: 30.6 pg (ref 26.0–34.0)
MCHC: 32.3 g/dL (ref 30.0–36.0)
MCV: 94.8 fL (ref 80.0–100.0)
Platelets: 166 10*3/uL (ref 150–400)
RBC: 4.22 MIL/uL (ref 3.87–5.11)
RDW: 13.8 % (ref 11.5–15.5)
WBC: 8.4 10*3/uL (ref 4.0–10.5)
nRBC: 0 % (ref 0.0–0.2)

## 2022-09-04 LAB — BLOOD GAS, VENOUS
Acid-Base Excess: 4.5 mmol/L — ABNORMAL HIGH (ref 0.0–2.0)
Bicarbonate: 30.4 mmol/L — ABNORMAL HIGH (ref 20.0–28.0)
Drawn by: 164
O2 Saturation: 95.8 %
Patient temperature: 37
pCO2, Ven: 49 mmHg (ref 44–60)
pH, Ven: 7.4 (ref 7.25–7.43)
pO2, Ven: 68 mmHg — ABNORMAL HIGH (ref 32–45)

## 2022-09-04 LAB — ECHOCARDIOGRAM COMPLETE BUBBLE STUDY
AR max vel: 2.79 cm2
AV Area VTI: 3.16 cm2
AV Area mean vel: 2.83 cm2
AV Mean grad: 9 mmHg
AV Peak grad: 17.1 mmHg
Ao pk vel: 2.07 m/s
Area-P 1/2: 2.48 cm2
S' Lateral: 3.6 cm

## 2022-09-04 LAB — SEDIMENTATION RATE: Sed Rate: 30 mm/hr — ABNORMAL HIGH (ref 0–22)

## 2022-09-04 LAB — PHOSPHORUS: Phosphorus: 3 mg/dL (ref 2.5–4.6)

## 2022-09-04 LAB — C-REACTIVE PROTEIN: CRP: 0.6 mg/dL (ref <1.0)

## 2022-09-04 LAB — MAGNESIUM: Magnesium: 1.6 mg/dL — ABNORMAL LOW (ref 1.7–2.4)

## 2022-09-04 MED ORDER — ALBUTEROL SULFATE (2.5 MG/3ML) 0.083% IN NEBU
2.5000 mg | INHALATION_SOLUTION | RESPIRATORY_TRACT | Status: DC | PRN
  Administered 2022-09-04: 2.5 mg via RESPIRATORY_TRACT
  Filled 2022-09-04: qty 3

## 2022-09-04 MED ORDER — PERFLUTREN LIPID MICROSPHERE
1.0000 mL | INTRAVENOUS | Status: AC | PRN
  Administered 2022-09-04: 2 mL via INTRAVENOUS

## 2022-09-04 MED ORDER — ALBUTEROL SULFATE (2.5 MG/3ML) 0.083% IN NEBU
INHALATION_SOLUTION | RESPIRATORY_TRACT | Status: AC
  Administered 2022-09-04: 2.5 mg
  Filled 2022-09-04: qty 3

## 2022-09-04 MED ORDER — INSULIN ASPART 100 UNIT/ML IJ SOLN
2.0000 [IU] | Freq: Three times a day (TID) | INTRAMUSCULAR | Status: DC
  Administered 2022-09-05 (×2): 2 [IU] via SUBCUTANEOUS

## 2022-09-04 NOTE — Care Plan (Signed)
Transport attempted to get pt. For CT. Unable to locate RN, no answer when calling floor and no RN attached to pt. In chart to message.  Will continue to attempt to call.

## 2022-09-04 NOTE — Progress Notes (Signed)
PT Cancellation/sign off Note  Patient Details Name: Monica Ellis MRN: [id number] DOB: 1961/11/30   Discontinue PT    Reason Eval/Treat Not Completed: Patient declined, no reason specified (Pt reports she is at her baseline)  Pt states she is just concerned with her breathing and that her mobility is at baseline, which is limited.  She declines PT services at this time. We also discussed OT services and she feels she is at baseline for ADLs as well and did not want OT at this time.  If pt condition changes or mobility issues arise please reorder. Thanks  Monica Ellis, [place]  Office [phone] 09/04/2022       Melvern Banker 09/04/2022, 11:47 AM

## 2022-09-04 NOTE — Progress Notes (Signed)
  Echocardiogram 2D Echocardiogram with contrast has been performed.  Monica Ellis F 09/04/2022, 9:45 AM

## 2022-09-04 NOTE — Progress Notes (Signed)
Patient on 6L/[place]. C/o SOB, difficulty breathing. O2 sats 99%.  RR 16. Lung sounds diminished.  RRT Tim to bedside and breathing treatment given. Per pt no relief from breathing treatment. DR Altamease Oiler with internal medicine came to bedside and CXR ordered.

## 2022-09-04 NOTE — Evaluation (Addendum)
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/29/2021   Acute on chronic diastolic CHF (congestive heart failure) ([place]) 05/29/2021   Acute exacerbation of CHF (congestive heart failure) ([place]) 09/05/2020   Nicotine dependence, cigarettes, uncomplicated 99/24/2683   Acute on chronic respiratory failure with hypoxia ([place]) 06/10/2020   Nausea vomiting and diarrhea 06/14/2019   AKI (acute kidney injury) ([place]) 06/14/2019   CAP (community acquired pneumonia) 02/13/2018   Dizziness 02/12/2018   Autoimmune hepatitis ([place]) 11/18/2017   Essential hypertension 11/18/2017   Morbid obesity with BMI of 50.0-59.9, adult ([place]) 11/18/2017   History of depression 11/18/2017   GERD without esophagitis 11/18/2017   Dyslipidemia 11/18/2017   Vitamin D deficiency 11/18/2017   History of migraine 11/18/2017   History of meningioma 11/18/2017   DDD (degenerative disc disease), cervical 11/18/2017   DDD (degenerative disc disease), lumbar 11/18/2017   Acute diverticulitis 06/07/2017   Hypokalemia 06/07/2017   Hypomagnesemia 06/07/2017   Tobacco use disorder 06/07/2017   Atelectasis of both lungs 06/07/2017   Acute maxillary sinusitis 10/13/2016   Abnormal LFTs    Pain in the chest    Diabetes mellitus, type 2 ([place])    Hypoxia 12/29/2015   Uncontrolled type 2 diabetes mellitus 12/29/2015   Dyspnea 12/29/2015   LLQ pain 12/29/2015   SLE (systemic lupus erythematosus related syndrome) ([place]) 12/29/2015   Chest pain 12/29/2015   Transaminitis 12/29/2015   Chronic pain 12/29/2015   Depression 12/29/2015   Past Medical History:  Past Medical History:  Diagnosis Date   Autoimmune hepatitis  ([place])    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   Brain tumor ([place])    hx of   Diabetes mellitus without complication ([place])    GERD (gastroesophageal reflux disease)    Hypertension    Lupus ([place])    Morbid obesity ([place])    Obesity hypoventilation syndrome ([place])    Pneumonia    Tobacco use disorder    Past Surgical History:  Past Surgical History:  Procedure Laterality Date   CHOLECYSTECTOMY     DILATION AND CURETTAGE OF UTERUS     EYE SURGERY Bilateral    laser surgery    IR TRANSCATHETER BX  11/03/2018   IR US GUIDE VASC ACCESS RIGHT  11/03/2018   IR VENOGRAM HEPATIC W HEMODYNAMIC EVALUATION  11/03/2018   RIGHT HEART CATH N/A 09/01/2020   Procedure: RIGHT HEART CATH;  Surgeon: Jolaine Artist, MD;  Location: [facility];  Service: Cardiovascular;  Laterality: N/A;   HPI:  61 year old female with PMHx of HTN, lupus, morbid obesity, autoimmune hepatitis, DM2, migraine headaches, and two small meningiomas diagnosed in 2017 who presented to the ED on 9/29 for evaluation of right jaw decree sensation and tingling and slurred speech.  Patient's last known well was 20:00 on 9/28 and presented to the hospital outside of the thrombolytic therapy window.  Not on anticoagulation or antiplatelet medications at home.  MRI imaging reveals an acute left corona radiata infarction.   Assessment / Plan / Recommendation Clinical Impression  Pts cognitive abilities appear within functional limits and without evidence of acute change. Pt reports dysarthria of speech leading up to admission and during 1st day of hospitalization but notes great improvements in  speech and presently states speech function is close to baseline. No appreciable slurring exhibited in conversation during clinical interaction with SLP. Oral motor exam notable with trace right facial droop. Pt able to give precise history and background. Pt does have hx of respiratory issues (recent increased O2 demands this admission though  states chronic conditions of Lupus have caused respiratory exacerbations intermittently). Educated upon strategies to maximize breath support for speech (upright positioning for improved diaphragm support, phonation upon exhalation and pacing). No further ST needs identified.    SLP Assessment  SLP Recommendation/Assessment: Patient does not need any further Speech [facility] Services SLP Visit Diagnosis: Dysarthria and anarthria (R47.1)    Recommendations for follow up therapy are one component of a multi-disciplinary discharge planning process, led by the attending physician.  Recommendations may be updated based on patient status, additional functional criteria and insurance authorization.    Follow Up Recommendations  No SLP follow up    Assistance Recommended at Discharge  None  Functional Status Assessment    Frequency and Duration           SLP Evaluation Cognition  Overall Cognitive Status: Within Functional Limits for tasks assessed Arousal/Alertness: Awake/alert Orientation Level: Oriented X4 Attention: Focused;Sustained Focused Attention: Appears intact Sustained Attention: Appears intact Memory: Appears intact Awareness: Appears intact Problem Solving: Appears intact Executive Function: Organizing;Self Monitoring Organizing: Appears intact Self Monitoring: Appears intact Safety/Judgment: Appears intact       Comprehension  Auditory Comprehension Overall Auditory Comprehension: Appears within functional limits for tasks assessed Visual Recognition/Discrimination Discrimination: Within Function Limits    Expression Expression Primary Mode of Expression: Verbal Verbal Expression Overall Verbal Expression: Appears within functional limits for tasks assessed Written Expression Dominant Hand: Left   Oral / Motor  Oral Motor/Sensory Function Overall Oral Motor/Sensory Function: Mild impairment Facial ROM: Reduced right (trace right facial droop) Facial  Symmetry: Abnormal symmetry right (trace right facial droop) Facial Strength: Within Functional Limits Facial Sensation: Within Functional Limits Lingual ROM: Within Functional Limits Lingual Symmetry: Within Functional Limits Lingual Strength: Within Functional Limits Lingual Sensation: Within Functional Limits Mandible: Within Functional Limits Motor Speech Overall Motor Speech: Impaired (Pt states has improved since admission very close to baseline) Respiration: Impaired Level of Impairment: Conversation Phonation: Normal Resonance: Within functional limits Articulation: Within functional limitis Intelligibility: Intelligible Motor Planning: Witnin functional limits Effective Techniques: Slow rate;Increased vocal intensity;Over-articulate            Hayden Rasmussen MA, CCC-SLP Acute Rehabilitation Services   09/04/2022, 10:33 AM

## 2022-09-04 NOTE — Progress Notes (Addendum)
STROKE TEAM PROGRESS NOTE   SUBJECTIVE (INTERVAL HISTORY) No family is at the bedside.  Overall her condition is rapidly improving.  Patient lying in bed in decubitus position, stated that she felt short of breath, currently on 6 L nasal cannula.  She stated that her neurological symptoms has resolved.  MRI showed left CR infarct.   OBJECTIVE Temp:  [97.7 F (36.5 C)-98.7 F (37.1 C)] 97.7 F (36.5 C) (09/30 1529) Pulse Rate:  [47-59] 54 (09/30 1529) Cardiac Rhythm: Sinus bradycardia (09/30 0700) Resp:  [11-18] 16 (09/30 1529) BP: (146-172)/(55-91) 161/86 (09/30 1529) SpO2:  [94 %-100 %] 98 % (09/30 1529)  Recent Labs  Lab 09/03/22 1855 09/03/22 2155 09/04/22 0744 09/04/22 1149 09/04/22 1529  GLUCAP 136* 184* 161* 216* 213*   Recent Labs  Lab 09/03/22 1223 09/03/22 1232 09/04/22 0713  NA 135 138 138  K 4.1 4.1 3.6  CL 100 102 102  CO2 29  --  30  GLUCOSE 176* 177* 165*  BUN _0 CREATININE 0.90 0.80 0.84  CALCIUM 9.0  --  9.0  MG  --   --  1.6*  PHOS  --   --  3.0   Recent Labs  Lab 09/03/22 1223 09/04/22 0713  AST 42* 39  ALT 59* 56*  ALKPHOS 37* 30*  BILITOT 0.7 0.8  PROT 9.0* 8.6*  ALBUMIN 3.3* 3.4*   Recent Labs  Lab 09/03/22 1223 09/03/22 1232 09/04/22 0713  WBC 7.6  --  8.4  NEUTROABS 5.3  --   --   HGB 13.0 14.6 12.9  HCT 41.3 43.0 40.0  MCV 95.4  --  94.8  PLT 174  --  166   No results for input(s): "CKTOTAL", "CKMB", "CKMBINDEX", "TROPONINI" in the last 168 hours. Recent Labs    09/03/22 1223  LABPROT 13.6  INR 1.1   Recent Labs    09/03/22 1539  COLORURINE YELLOW  LABSPEC 1.015  PHURINE 5.0  GLUCOSEU NEGATIVE  HGBUR NEGATIVE  BILIRUBINUR NEGATIVE  KETONESUR NEGATIVE  PROTEINUR 100*  NITRITE NEGATIVE  LEUKOCYTESUR NEGATIVE       Component Value Date/Time   CHOL 123 09/03/2022 1223   TRIG 82 09/03/2022 1223   HDL 46 09/03/2022 1223   CHOLHDL 2.7 09/03/2022 1223   VLDL 16 09/03/2022 1223   LDLCALC 61 09/03/2022  1223   LDLCALC 87 11/12/2019 1429   Lab Results  Component Value Date   HGBA1C 5.6 09/03/2022      Component Value Date/Time   LABOPIA NONE DETECTED 09/03/2022 1539   COCAINSCRNUR NONE DETECTED 09/03/2022 1539   LABBENZ NONE DETECTED 09/03/2022 1539   AMPHETMU NONE DETECTED 09/03/2022 1539   THCU NONE DETECTED 09/03/2022 1539   LABBARB NONE DETECTED 09/03/2022 1539    Recent Labs  Lab 09/03/22 1223  ETH <10    I have personally reviewed the radiological images below and agree with the radiology interpretations.  ECHOCARDIOGRAM COMPLETE BUBBLE STUDY  Result Date: 09/04/2022    ECHOCARDIOGRAM REPORT   Patient Name:   DACI SMITH-MCIVER Date of Exam: 09/04/2022 Medical Rec #:  [id number]            Height:       66.0 in Accession #:    [id number]           Weight:       315.0 lb Date of Birth:  1961/10/15            BSA:  2.426 m Patient Age:    61 years             BP:           154/57 mmHg Patient Gender: F                    HR:           55 bpm. Exam Location:  Inpatient Procedure: 2D Echo, Cardiac Doppler, Color Doppler, Intracardiac Opacification            Agent and Saline Contrast Bubble Study Indications:    Stroke  History:        Patient has prior history of Echocardiogram examinations, most                 recent 09/02/2020. COPD, Arrythmias:Bradycardia,                 Signs/Symptoms:Shortness of Breath and Dyspnea; Risk                 Factors:Current Smoker. Lupus.  Sonographer:    Merrie Roof RDCS Referring Phys: [id number] [place]  1. No apical thrombus with Definity contrast. Left ventricular ejection fraction, by estimation, is 65 to 70%. The left ventricle has normal function. The left ventricle has no regional wall motion abnormalities. There is moderate left ventricular hypertrophy. Left ventricular diastolic parameters are consistent with Grade I diastolic dysfunction (impaired relaxation). Elevated left ventricular end-diastolic pressure.  2.  Right ventricular systolic function is normal. The right ventricular size is normal.  3. Left atrial size was mildly dilated.  4. The mitral valve is abnormal. Trivial mitral valve regurgitation. Moderate mitral annular calcification.  5. The aortic valve was not well visualized. Aortic valve regurgitation is not visualized. Aortic valve mean gradient measures 9.0 mmHg.  6. The inferior vena cava is normal in size with greater than 50% respiratory variability, suggesting right atrial pressure of 3 mmHg.  7. Agitated saline contrast bubble study was negative, with no evidence of any interatrial shunt. Comparison(s): Changes from prior study are noted. 09/02/2020: LVEF 55-60%, Grade 2 DD, elevated LVEDP. FINDINGS  Left Ventricle: No apical thrombus with Definity contrast. Left ventricular ejection fraction, by estimation, is 65 to 70%. The left ventricle has normal function. The left ventricle has no regional wall motion abnormalities. Definity contrast agent was  given IV to delineate the left ventricular endocardial borders. The left ventricular internal cavity size was normal in size. There is moderate left ventricular hypertrophy. Left ventricular diastolic parameters are consistent with Grade I diastolic dysfunction (impaired relaxation). Elevated left ventricular end-diastolic pressure. The E/e' is 21. Right Ventricle: The right ventricular size is normal. No increase in right ventricular wall thickness. Right ventricular systolic function is normal. Left Atrium: Left atrial size was mildly dilated. Right Atrium: Right atrial size was normal in size. Pericardium: There is no evidence of pericardial effusion. Mitral Valve: The mitral valve is abnormal. Moderate mitral annular calcification. Trivial mitral valve regurgitation. Tricuspid Valve: The tricuspid valve is normal in structure. Tricuspid valve regurgitation is not demonstrated. Aortic Valve: The aortic valve was not well visualized. Aortic valve  regurgitation is not visualized. Aortic valve mean gradient measures 9.0 mmHg. Aortic valve peak gradient measures 17.1 mmHg. Aortic valve area, by VTI measures 3.16 cm. Pulmonic Valve: The pulmonic valve was normal in structure. Pulmonic valve regurgitation is not visualized. Aorta: The aortic root and ascending aorta are structurally normal, with no evidence of dilitation. Venous: The  inferior vena cava is normal in size with greater than 50% respiratory variability, suggesting right atrial pressure of 3 mmHg. IAS/Shunts: No atrial level shunt detected by color flow Doppler. Agitated saline contrast was given intravenously to evaluate for intracardiac shunting. Agitated saline contrast bubble study was negative, with no evidence of any interatrial shunt.  LEFT VENTRICLE PLAX 2D LVIDd:         5.10 cm   Diastology LVIDs:         3.60 cm   LV e' medial:    5.77 cm/s LV PW:         1.40 cm   LV E/e' medial:  18.5 LV IVS:        1.50 cm   LV e' lateral:   4.57 cm/s LVOT diam:     2.20 cm   LV E/e' lateral: 23.4 LV SV:         139 LV SV Index:   57 LVOT Area:     3.80 cm  RIGHT VENTRICLE RV Basal diam:  4.00 cm RV Mid diam:    3.30 cm RV S prime:     11.70 cm/s TAPSE (M-mode): 2.1 cm LEFT ATRIUM             Index        RIGHT ATRIUM           Index LA diam:        4.10 cm 1.69 cm/m   RA Area:     16.30 cm LA Vol (A2C):   56.9 ml 23.46 ml/m  RA Volume:   37.70 ml  15.54 ml/m LA Vol (A4C):   85.9 ml 35.41 ml/m LA Biplane Vol: 75.4 ml 31.08 ml/m  AORTIC VALVE AV Area (Vmax):    2.79 cm AV Area (Vmean):   2.83 cm AV Area (VTI):     3.16 cm AV Vmax:           207.00 cm/s AV Vmean:          133.000 cm/s AV VTI:            0.439 m AV Peak Grad:      17.1 mmHg AV Mean Grad:      9.0 mmHg LVOT Vmax:         152.00 cm/s LVOT Vmean:        99.100 cm/s LVOT VTI:          0.365 m LVOT/AV VTI ratio: 0.83  AORTA Ao Asc diam: 3.70 cm MITRAL VALVE MV Area (PHT): 2.48 cm     SHUNTS MV Decel Time: 306 msec     Systemic VTI:   0.36 m MV E velocity: 107.00 cm/s  Systemic Diam: 2.20 cm MV A velocity: 112.00 cm/s MV E/A ratio:  0.96 Lyman Bishop MD Electronically signed by Lyman Bishop MD Signature Date/Time: 09/04/2022/12:22:07 PM    Final    DG CHEST PORT 1 VIEW  Result Date: 09/04/2022 CLINICAL DATA:  Inpatient for cerebrovascular accident EXAM: PORTABLE CHEST 1 VIEW COMPARISON:  CT abdomen pelvis 1023, chest radiograph 05/29/2021 FINDINGS: Stable enlarged cardiac silhouette. There is an oblong density in the lingula. This area not covered on recent CT of the abdomen. Atelectasis at the LEFT lung base on comparison CT. RIGHT lung relatively clear. No pneumothorax. No acute osseous abnormality. IMPRESSION: Lingular opacity with differential including pulmonary nodule/mass, pneumonia, or atelectasis. Consider CT of the chest for further evaluation. Electronically Signed   By: Helane Gunther.D.  On: 09/04/2022 10:16   MR ANGIO HEAD WO CONTRAST  Result Date: 09/03/2022 CLINICAL DATA:  Stroke EXAM: MRA HEAD WITHOUT CONTRAST MRA NECK WITHOUT AND WITH CONTRAST TECHNIQUE: Angiographic images of the Circle of Willis were acquired using MRA technique without intravenous contrast. Angiographic images of the neck were acquired using MRA technique without and with intravenous contrast. Carotid stenosis measurements (when applicable) are obtained utilizing NASCET criteria, using the distal internal carotid diameter as the denominator. CONTRAST:  10 cc Vueway COMPARISON:  Same-day brain MRI FINDINGS: MRA HEAD FINDINGS Anterior circulation: The intracranial ICAs are patent without hemodynamically significant stenosis or occlusion. The bilateral MCAs are patent without proximal stenosis or occlusion. The bilateral ACAs are patent without proximal stenosis or occlusion. There is no aneurysm or AVM. Posterior circulation: The bilateral V4 segments are patent. Basilar artery is patent. The major cerebellar arteries appear patent. The bilateral  PCAs are patent, without proximal stenosis or occlusion. Bilateral posterior communicating arteries are identified. Anatomic variants: None. MRA NECK FINDINGS Aortic arch: Imaged aortic arch is normal. Origins of the major branch vessels are patent. The subclavian arteries are patent to the level imaged. Right carotid system: The right common, internal, and external carotid arteries are patent. There is probable atherosclerotic narrowing of the proximal internal carotid artery at the bifurcation resulting in estimated 30% stenosis. The distal internal carotid artery is patent. The external carotid artery is patent. There is no convincing evidence of dissection or aneurysm. Left carotid system: The left common, internal, and external carotid arteries are patent without hemodynamically significant stenosis or occlusion. There is no convincing evidence of dissection or aneurysm. Vertebral arteries: The vertebral arteries are patent with antegrade flow. There is no convincing evidence of hemodynamically significant stenosis or occlusion. There is no convincing evidence of dissection or aneurysm. Other: None. IMPRESSION: 1. Patent intracranial vasculature without significant stenosis or occlusion. 2. 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 Otherwise patent vasculature of the neck Electronically Signed   By: Valetta Mole M.D.   On: 09/03/2022 19:40   MR ANGIO NECK W WO CONTRAST  Result Date: 09/03/2022 CLINICAL DATA:  Stroke EXAM: MRA HEAD WITHOUT CONTRAST MRA NECK WITHOUT AND WITH CONTRAST TECHNIQUE: Angiographic images of the Circle of Willis were acquired using MRA technique without intravenous contrast. Angiographic images of the neck were acquired using MRA technique without and with intravenous contrast. Carotid stenosis measurements (when applicable) are obtained utilizing NASCET criteria, using the distal internal carotid diameter as the  denominator. CONTRAST:  10 cc Vueway COMPARISON:  Same-day brain MRI FINDINGS: MRA HEAD FINDINGS Anterior circulation: The intracranial ICAs are patent without hemodynamically significant stenosis or occlusion. The bilateral MCAs are patent without proximal stenosis or occlusion. The bilateral ACAs are patent without proximal stenosis or occlusion. There is no aneurysm or AVM. Posterior circulation: The bilateral V4 segments are patent. Basilar artery is patent. The major cerebellar arteries appear patent. The bilateral PCAs are patent, without proximal stenosis or occlusion. Bilateral posterior communicating arteries are identified. Anatomic variants: None. MRA NECK FINDINGS Aortic arch: Imaged aortic arch is normal. Origins of the major branch vessels are patent. The subclavian arteries are patent to the level imaged. Right carotid system: The right common, internal, and external carotid arteries are patent. There is probable atherosclerotic narrowing of the proximal internal carotid artery at the bifurcation resulting in estimated 30% stenosis. The distal internal carotid artery is patent. The external carotid artery is patent. There is no convincing  evidence of dissection or aneurysm. Left carotid system: The left common, internal, and external carotid arteries are patent without hemodynamically significant stenosis or occlusion. There is no convincing evidence of dissection or aneurysm. Vertebral arteries: The vertebral arteries are patent with antegrade flow. There is no convincing evidence of hemodynamically significant stenosis or occlusion. There is no convincing evidence of dissection or aneurysm. Other: None. IMPRESSION: 1. Patent intracranial vasculature without significant stenosis or occlusion. 2. 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 Otherwise patent vasculature of the neck Electronically Signed   By: Valetta Mole M.D.   On:  09/03/2022 19:40   MR BRAIN WO CONTRAST  Result Date: 09/03/2022 CLINICAL DATA:  Slurred speech EXAM: MRI HEAD WITHOUT CONTRAST TECHNIQUE: Multiplanar, multiecho pulse sequences of the brain and surrounding structures were obtained without intravenous contrast. COMPARISON:  MRI Brain 11/13/19 FINDINGS: Brain: Area of diffusion restriction in the corona radiata on the left (series 2, image 29) has a correlate on the ADC map and along the T2/FLAIR weighted sequences) and is worrisome for an acute infarct. Redemonstrated is a calcified meningioma along the left frontoparietal convexity. Sequela of moderate chronic microvascular ischemic change. Vascular: Normal flow voids. Skull and upper cervical spine: Normal marrow signal. Sinuses/Orbits: Small mucous retention cyst in the left maxillary sinus. Left-sided lens replacement. Other: None. IMPRESSION: Acute infarct in the corona radiata on the left. No evidence of hemorrhage. Findings were discussed with Dr. Percell Miller on 09/03/22 at 1:30 PM. Electronically Signed   By: Marin Roberts M.D.   On: 09/03/2022 13:32   CT HEAD WO CONTRAST (5MM)  Result Date: 08/15/2022 CLINICAL DATA:  Elevated intracranial pressure. Follow-up meningioma. EXAM: CT HEAD WITHOUT CONTRAST TECHNIQUE: Contiguous axial images were obtained from the base of the skull through the vertex without intravenous contrast. RADIATION DOSE REDUCTION: This exam was performed according to the departmental dose-optimization program which includes automated exposure control, adjustment of the mA and/or kV according to patient size and/or use of iterative reconstruction technique. COMPARISON:  MRI 11/13/2019 FINDINGS: Brain: No evidence of acute infarction, hemorrhage, hydrocephalus, extra-axial collection or mass lesion/mass effect. Calcified meningioma overlying the left frontoparietal lobe is again seen and appears unchanged measuring 1.5 x 0.9 cm, image 25/2. There is mild diffuse low-attenuation within the  subcortical and periventricular white matter compatible with chronic microvascular disease. Prominence of the sulci identified compatible with mild brain atrophy. Vascular: No hyperdense vessel or unexpected calcification. Skull: Normal. Negative for fracture or focal lesion. Sinuses/Orbits: Paranasal sinuses and mastoid air cells are clear. Other: None. IMPRESSION: 1. No acute intracranial abnormalities. 2. Stable left frontoparietal meningioma. 3. Chronic small vessel ischemic disease and brain atrophy. Electronically Signed   By: Kerby Moors M.D.   On: 08/15/2022 05:45   CT ABDOMEN PELVIS WO CONTRAST  Result Date: 08/15/2022 CLINICAL DATA:  Nausea and abdominal pain EXAM: CT ABDOMEN AND PELVIS WITHOUT CONTRAST TECHNIQUE: Multidetector CT imaging of the abdomen and pelvis was performed following the standard protocol without IV contrast. RADIATION DOSE REDUCTION: This exam was performed according to the departmental dose-optimization program which includes automated exposure control, adjustment of the mA and/or kV according to patient size and/or use of iterative reconstruction technique. COMPARISON:  CT 01/07/2019 FINDINGS: Lower chest: Scarring/atelectasis in the lower lungs. No acute abnormality. Mixed nodular contour of the liver compatible with cirrhosis. Cholecystectomy. Prominent common bile duct likely due to reservoir effect. Hepatobiliary: Nodular contour of the liver compatible with cirrhosis. Cholecystectomy. Prominent common bile duct  due to reservoir effect. Pancreas: Unremarkable. No pancreatic ductal dilatation or surrounding inflammatory changes. Spleen: Normal in size without focal abnormality. Adrenals/Urinary Tract: Adrenal glands are unremarkable. Kidneys are normal, without renal calculi, focal lesion, or hydronephrosis. 1.0 cm lesion about the superior pole of the right kidney is unchanged in size since 04/07/2019 and now appears hyperdense compatible with hemorrhagic or proteinaceous  cysts. No follow-up is recommended. Bladder is unremarkable. Stomach/Bowel: Stomach is within normal limits. Appendix appears normal. No evidence of bowel wall thickening, distention, or inflammatory changes. Vascular/Lymphatic: Aortic atherosclerosis. No enlarged abdominal or pelvic lymph nodes. Reproductive: Calcified uterine fibroid.  Unremarkable adnexa. Other: No ascites or free air. Musculoskeletal: No acute osseous findings. IMPRESSION: 1. No acute finding in the abdomen or pelvis. 2. Cirrhosis. Electronically Signed   By: Placido Sou M.D.   On: 08/15/2022 03:20     PHYSICAL EXAM  Temp:  [97.7 F (36.5 C)-98.7 F (37.1 C)] 97.7 F (36.5 C) (09/30 1529) Pulse Rate:  [47-59] 54 (09/30 1529) Resp:  [11-18] 16 (09/30 1529) BP: (146-172)/(55-91) 161/86 (09/30 1529) SpO2:  [94 %-100 %] 98 % (09/30 1529)  General - obese, well developed, in mild respiratory distress.  Ophthalmologic - fundi not visualized due to noncooperation.  Cardiovascular - Regular rhythm and rate.  Mental Status -  Level of arousal and orientation to time, place, and person were intact. Language including expression, naming, repetition, comprehension was assessed and found intact. Fund of Knowledge was assessed and was intact.  Cranial Nerves II - XII - II - Visual field intact OU. III, IV, VI - Extraocular movements intact. V - Facial sensation intact bilaterally. VII - Facial movement intact bilaterally. VIII - Hearing & vestibular intact bilaterally. X - Palate elevates symmetrically. XI - Chin turning & shoulder shrug intact bilaterally. XII - Tongue protrusion intact.  Motor Strength - The patient's strength was normal in all extremities and pronator drift was absent.  Bulk was normal and fasciculations were absent.   Motor Tone - Muscle tone was assessed at the neck and appendages and was normal.  Reflexes - The patient's reflexes were symmetrical in all extremities and she had no pathological  reflexes.  Sensory - Light touch, temperature/pinprick were assessed and were symmetrical.    Coordination - The patient had normal movements in the hands with no ataxia or dysmetria.  Tremor was absent.  Gait and Station - deferred.   ASSESSMENT/PLAN Ms. Chris Smith-McIver is a 61 y.o. female with history of hypertension, diabetes, lupus, also immune hepatitis, migraine, smoker, known meningioma admitted for right facial numbness and slurred speech. No tPA given due to outside window.    Stroke:  left CR infarct likely secondary to small vessel disease source CT no acute abnormality MRI left CR infarct MRA head and neck right ICA 30% stenosis 2D Echo EF 65 to 70% LDL 61 HgbA1c 5.6 UDS negative SCDs for VTE prophylaxis No antithrombotic prior to admission, now on aspirin 81 mg daily and clopidogrel 75 mg daily DAPT for 3 weeks and then aspirin alone. Patient counseled to be compliant with her antithrombotic medications Ongoing aggressive stroke risk factor management Therapy recommendations: Pending Disposition: Pending  Diabetes HgbA1c 5.6 goal < 7.0 Controlled Currently on insulin CBG monitoring SSI DM education and close PCP follow up  Hypertension Stable Long term BP goal normotensive  Hyperlipidemia Home meds: None LDL 61, goal < 70 No statin recommended as LDL at goal and patient has autoimmune hepatitis with elevated LFTs  Tobacco abuse  Current smoker Smoking cessation counseling provided Nicotine patch provided Pt is willing to quit  Other Stroke Risk Factors Morbid obesity, Body mass index is 50.84 kg/m.  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 Cross reaction from AIH - agree with primary team to discuss with ID Known meningioma - calcified meningioma along the left frontoparietal  convexity.  Hospital day # 1  Neurology will sign off. Please call with questions. Pt will follow up with Dr. Leta Baptist at [facility] in about 4 weeks. Thanks for the consult.   Rosalin Hawking, MD PhD Stroke Neurology 09/04/2022 5:44 PM    To contact Stroke Continuity provider, please refer to [email or web address] After hours, contact General Neurology

## 2022-09-04 NOTE — Progress Notes (Signed)
Subjective:   Patient reports that overnight and this morning she has been feeling like she is having trouble taking deep breaths even with breathing treatments.  She notes at home that she is on 3L of oxygen 24/7 and this usually works well for her symptoms but now she is having having trouble breathing on 5L.   She last saw pulmonology in 2021 it was felt that her symptoms were multifactorial in nature.  They felt like post-COVID lung changes and portal pulmonary hypertension and hepatopulmonary syndrome were ruled out.  With normal right ventricular size and function and a normal CT scan of the chest.  She did have diagnosis of obesity hypoventilation syndrome and was noted to have bibasilar atelectatic changes on imaging she was told to use trilogy at night. It is unclear when she stopped using this. But she notes she only wears oxygen at home. Finally, the possibility of vanishing lung syndrome in the setting of her SLE was raised as well. She was told to continue trilogy, incentive spirometry, and her myfortic and prednisone.   She unfortunately has not followed up with pulmonology in person since that time, it appears from chart review.   Regarding her stroke patient feels her symptoms are continuing to improve and she "feels blessed regarding stroke outocme."  Objective:  Vital signs in last 24 hours: Vitals:   09/04/22 0639 09/04/22 0743 09/04/22 0912 09/04/22 1132  BP:  (!) 154/57  (!) 161/91  Pulse:  (!) 56  (!) 59  Resp:  16  16  Temp:  98.2 F (36.8 C)  98.3 F (36.8 C)  TempSrc:  Oral  Oral  SpO2: 100% 100% 100% 99%  Weight:      Height:        Constitutional: Well-developed, well-nourished, and in no distress.  HENT:  Head: Normocephalic and atraumatic.  Eyes: EOM are normal.  Neck: Normal range of motion.  Cardiovascular: bradycardic rate, regular rhythm, intact distal pulses. No gallop and no friction rub.  No murmur heard. No lower extremity edema  Pulmonary:  Non labored breathing on room air, no wheezing or rales  Abdominal: Soft. Normal bowel sounds. Non distended and non tender Musculoskeletal: Normal range of motion.        General: No tenderness or edema.  Neurological: Alert and oriented to person, place, and time. Non focal  Skin: Skin is warm and dry.    Assessment/Plan:  Principal Problem:   CVA (cerebrovascular accident) [facility])  Ms. Narcissus Smith-McIver is a 61 year old female with a past medical history significant for TIA, hypertension, lupus, class III obesity, OHS, autoimmune hepatitis c/b cirrhosis, type 2 diabetes, and history of 2 small meningiomas who presented to the ED for slurred speech found to have acute L corona radiata infarct and chronic microvascular changes.   #Acute L corona radiate infarct Likely in the setting of patient's hypertension, autoimmune conditions, tobacco use, and diabetes.  States symptoms are much improved.  She is currently on DAPT.  Her A1c is 5.6.  Her total cholesterol is 123 and her LDL is 61.  Her TTE was performed this a.m. but the results are pending. -We will resume patient's home antihypertensives in the a.m. -Her LDL is low-level for secondary prevention, will follow up neurology recommendations -Continue DAPT therapy -TOC for tobacco use cessation -Follow-up TTE results -Fortunately patient decline PT and OT services as she has had no difficulty with swallowing  #Shortness of breath As previously mentioned, patient noted to have chronic hypoxemic  respiratory failure on 3 L nasal cannula throughout the day at home.  She was seen by pulmonology 07/2020.  It was felt that her symptoms were primarily from obesity hypoventilation syndrome/OSA and there was also discussion that there could be a component of lupus involvement.  She was instructed to continue her Myfortic and prednisone, Trilogy, and use incentive spirometer.  She has had PFTs in the past which were consistent with restrictive  pattern in the setting of her severe obesity.  She unfortunately has not followed up with pulmonology since that time.  She also notes that she no longer uses her trilogy machine.  Overnight, patient did report feeling of not being able to take a deep breath.  During this time her O2 sats were rater than 95% on her 3 L.  She was not tachypneic.  Her lung sounds were diminished but no rales or wheezing were appreciated.  Patient's oxygen was increased to 6 L nasal cannula which she states mildly improved her symptoms.  She also received a one-time nebulizer treatment.  When patient was evaluated by the day team she notes that she continued to have a feeling of not being able to take a deep breath despite the interventions.   A chest x-ray was obtained which did show lingular opacity with a read of it being atelectasis v pneumonia versus nodule.  An ABG was also obtained, unfortunately it was mixed with venous blood. Unclear what is contributing to patient's current symptoms possibly result of obesity hypoventilation syndrome, acute worsening of her lupus pulmonary involvement. Patient currently without cough, tachycardia, low suspicion for pneumonia. -Will obtain ct chest to further characterize lingular lesion -Continue DuoNebs and steroids -BiPAP at night -If these interventions do not help patient's symptoms, will consider increasing patient steroids for short period pending her CT chest findings -ESR/CRP    #AIH diagnosed in 2012 #Cirrhosis  Patient is followed by GI at [facility]. She has had recent screening for [facility] which was negative. She is currently on myfortic for her AIH and prednisone. She has been intolerant of azathioprine and 6-mercaptopurine and cellcept (nausea). She is asymptomatic and appears compensated on exam. Her MELDNa score is 7. It does not appear that she has had variceal screening though patient is on nadolol.  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 She does have a baseline 3L [place] O2 requirement at home continuously.   #HTN Hold home antihypertensives. Resume in the AM.   #T2DM A1c 5.6.   Prior to Admission Living Arrangement: Anticipated Discharge Location: Barriers to Discharge: Dispo: Anticipated discharge in approximately 4 day(s).   Rick Duff, MD 09/04/2022, 12:19 PM After 5pm on weekdays and 1pm on weekends: On Call pager [phone]

## 2022-09-04 NOTE — Progress Notes (Signed)
OT Cancellation Note  Patient Details Name: Monica Ellis MRN: [id number] DOB: March 12, 1961   Cancelled Treatment:    Reason Eval/Treat Not Completed: OT screened, no needs identified, will sign off. Pt declining OT services, stating she is at her baseline. Family assists as needed with ADL's and mobility. OT will sign off, please reconsult if needs arise.   Glennys Schorsch Elane Mei Suits 09/04/2022, 2:47 PM

## 2022-09-05 ENCOUNTER — Inpatient Hospital Stay (HOSPITAL_COMMUNITY): Payer: Medicare Other

## 2022-09-05 LAB — CBC
HCT: 39 % (ref 36.0–46.0)
Hemoglobin: 12.6 g/dL (ref 12.0–15.0)
MCH: 30.4 pg (ref 26.0–34.0)
MCHC: 32.3 g/dL (ref 30.0–36.0)
MCV: 94.2 fL (ref 80.0–100.0)
Platelets: 175 10*3/uL (ref 150–400)
RBC: 4.14 MIL/uL (ref 3.87–5.11)
RDW: 13.7 % (ref 11.5–15.5)
WBC: 7.8 10*3/uL (ref 4.0–10.5)
nRBC: 0 % (ref 0.0–0.2)

## 2022-09-05 LAB — COMPREHENSIVE METABOLIC PANEL
ALT: 55 U/L — ABNORMAL HIGH (ref 0–44)
AST: 36 U/L (ref 15–41)
Albumin: 3.2 g/dL — ABNORMAL LOW (ref 3.5–5.0)
Alkaline Phosphatase: 30 U/L — ABNORMAL LOW (ref 38–126)
Anion gap: 6 (ref 5–15)
BUN: 13 mg/dL (ref 8–23)
CO2: 32 mmol/L (ref 22–32)
Calcium: 9.2 mg/dL (ref 8.9–10.3)
Chloride: 101 mmol/L (ref 98–111)
Creatinine, Ser: 0.78 mg/dL (ref 0.44–1.00)
GFR, Estimated: >60 mL/min (ref >60)
Glucose, Bld: 215 mg/dL — ABNORMAL HIGH (ref 70–99)
Potassium: 3.6 mmol/L (ref 3.5–5.1)
Sodium: 139 mmol/L (ref 135–145)
Total Bilirubin: 0.9 mg/dL (ref 0.3–1.2)
Total Protein: 8.5 g/dL — ABNORMAL HIGH (ref 6.5–8.1)

## 2022-09-05 LAB — GLUCOSE, CAPILLARY
Glucose-Capillary: 173 mg/dL — ABNORMAL HIGH (ref 70–99)
Glucose-Capillary: 188 mg/dL — ABNORMAL HIGH (ref 70–99)

## 2022-09-05 MED ORDER — HYDROCHLOROTHIAZIDE 12.5 MG PO TABS: 12.5000 mg | ORAL_TABLET | Freq: Every day | ORAL | 3 refills | Status: DC

## 2022-09-05 MED ORDER — HYDROCHLOROTHIAZIDE 12.5 MG PO TABS
12.5000 mg | ORAL_TABLET | Freq: Every day | ORAL | Status: DC
  Administered 2022-09-05: 12.5 mg via ORAL
  Filled 2022-09-05: qty 1

## 2022-09-05 MED ORDER — ASPIRIN 81 MG PO CHEW: 81.0000 mg | CHEWABLE_TABLET | Freq: Every day | ORAL | 3 refills | Status: AC

## 2022-09-05 MED ORDER — CLOPIDOGREL BISULFATE 75 MG PO TABS: 75.0000 mg | ORAL_TABLET | Freq: Every day | ORAL | 0 refills | Status: AC

## 2022-09-05 NOTE — Discharge Instructions (Signed)
It was a pleasure being involved in your care.  You were admitted to the hospital because you are having some weakness, after we did some imaging, we were able to confirm that you had a stroke.  Very happy to see that you are still able to perform everything normally, and the stroke did not affect any of her movements.  We are giving you some meet new medications.  We are giving you aspirin, and Plavix.  Please take both of them together for the next 3 weeks, and after 3 weeks please stop using the Plavix, and continue taking the aspirin daily.  We are also giving you a medication called hydrochlorothiazide for your blood pressure.  We are also setting you up with an appointment with our clinic that is located in the basement of this hospital, they will be reaching out to you shortly most likely tomorrow to schedule an appointment.  Take care!

## 2022-09-05 NOTE — Discharge Summary (Addendum)
Name: Monica Ellis MRN: [id number] DOB: 01-23-1961 61 y.o. PCP: Clovia Cuff, MD  Date of Admission: 09/03/2022 11:24 AM Date of Discharge: 09/05/2022 Attending Physician: Charise Killian, MD  Discharge Diagnosis: 1. Principal Problem:   CVA (cerebrovascular accident) [facility])   Discharge Medications: Allergies as of 09/05/2022       Reactions   Ace Inhibitors Swelling   Imuran [azathioprine] Nausea And Vomiting   Severe N/V/D and AKI after starting the med.   Lisinopril Swelling, Other (See Comments)   Other reaction(s): Angioedema (ALLERGY/intolerance), Face   Protonix [pantoprazole] Swelling   Pt states that it causes her lips to swell.    Tramadol Other (See Comments)   Other reaction(s): Mental Status Changes (intolerance) suicidal thoughts   Iodinated Contrast Media Swelling   When patient was in her 20's, swelled up all over after getting getting injection of contrast; did not have any other symptoms/ was given benadryl after that happened and had no further problems per patients/   Iodine Swelling   Omeprazole Swelling   Lips will swell        Medication List     STOP taking these medications    amoxicillin-clavulanate 875-125 MG tablet Commonly known as: AUGMENTIN   azithromycin 250 MG tablet Commonly known as: ZITHROMAX   buPROPion 150 MG 24 hr tablet Commonly known as: WELLBUTRIN XL   buPROPion 300 MG 24 hr tablet Commonly known as: WELLBUTRIN XL   LORazepam 0.5 MG tablet Commonly known as: ATIVAN   nadolol 80 MG tablet Commonly known as: CORGARD   sulfamethoxazole-trimethoprim 400-80 MG tablet Commonly known as: BACTRIM       TAKE these medications    ascorbic acid 500 MG tablet Commonly known as: VITAMIN C Take 2,000 mg by mouth daily.   aspirin 81 MG chewable tablet Chew 1 tablet (81 mg total) by mouth daily. Start taking on: September 06, 2022   busPIRone 10 MG tablet Commonly known as: BUSPAR Take 10 mg by mouth 2 (two) times  daily.   Cholecalciferol 10 MCG (400 UNIT) Caps Take 800 Units by mouth daily.   clopidogrel 75 MG tablet Commonly known as: PLAVIX Take 1 tablet (75 mg total) by mouth daily. Start taking on: September 06, 2022   Enulose 10 GM/15ML Soln Generic drug: lactulose (encephalopathy) Take by mouth.   FISH OIL PO Take 1 capsule by mouth every evening.   hydrochlorothiazide 12.5 MG tablet Commonly known as: HYDRODIURIL Take 1 tablet (12.5 mg total) by mouth daily.   insulin lispro protamine-lispro (75-25) 100 UNIT/ML Susp injection Commonly known as: HUMALOG 75/25 MIX Inject 120 Units into the skin 3 (three) times daily with meals.   lactulose 10 GM/15ML solution Commonly known as: CHRONULAC Take 30 mLs by mouth daily as needed for constipation.   magnesium 30 MG tablet Take 30 mg by mouth at bedtime.   multivitamin tablet Take 1 tablet by mouth daily.   mycophenolate 360 MG Tbec EC tablet Commonly known as: MYFORTIC Take 1,080 mg by mouth 2 (two) times daily.   ondansetron 4 MG disintegrating tablet Commonly known as: ZOFRAN-ODT Take 1 tablet (4 mg total) by mouth every 8 (eight) hours as needed for nausea or vomiting.   OneTouch Ultra test strip Generic drug: glucose blood USE TO TEST SUGARS 5 TIMES DAILY   oxyCODONE 10 mg 12 hr tablet Commonly known as: OXYCONTIN Take 10 mg by mouth every 6 (six) hours as needed for pain.   potassium chloride SA 20 MEQ tablet  Commonly known as: KLOR-CON M Take 2 tablets (40 mEq total) by mouth daily.   predniSONE 5 MG tablet Commonly known as: DELTASONE Take 10 mg by mouth daily with breakfast.   promethazine 25 MG tablet Commonly known as: PHENERGAN Take 12.5-25 mg by mouth every 6 (six) hours as needed. What changed: Another medication with the same name was removed. Continue taking this medication, and follow the directions you see here.   TURMERIC PO Take 400 mg by mouth daily.   zolpidem 10 MG tablet Commonly known as:  AMBIEN Take 10 mg by mouth at bedtime.        Disposition and follow-up:   Ms.Nel Smith-McIver was discharged from [facility] in Good condition.  At the hospital follow up visit please address:  1.  Oxygen requirements, at baseline she uses 3L. Patient will need an outpatient referral for pulmonology or a follow up with her pulmonologist, please make sure she has an appointment set.   We have started her on HCTZ low dose (and stopped nadolol) to help with blood pressure management, please assess if more anti-hypertensive is necessary.   2.  Labs / imaging needed at time of follow-up: BMP after initiating new anti-hypertensive  3.  Pending labs/ test needing follow-up: CXR 09/05/22  Follow-up Appointments:  Follow-up Information     Penumalli, Earlean Polka, MD. Schedule an appointment as soon as possible for a visit in 1 month(s).   Specialties: Neurology, Radiology Contact information: [address] [place] [address] [place] [facility] Course by problem list: #CVA #Acute L Corona Radiata Infarct Patient presented to the emergency department complaining of dizziness.  She stated at the time that she had been under a lot of stress.  She was also having trouble focusing her eyes and felt her mouth was not moving the weight should be.  When trying to have a conversation she could not move the right side of her mouth and had trouble saying words.  She has a history of type 2 diabetes, hypertension, and autoimmune conditions.  Neurology was then consulted, and patient was out of the window for tenecteplase at the time.  CT scan was done in the emergency department, showed no acute abnormality.  MRI was then done which showed a left corona radiata infarct.  MRI head and neck was done which showed right ICA 30% stenosis.  2D echo showed an EF of 65 to 70% without shunt, HbA1c was 5.6, UDS was negative as well.  Lipid panel showed an  LDL of 61.  She had not been on any antithrombotic medications before, and was started on aspirin 81 mg daily and clopidogrel 75 mg daily.  Continue this therapy for 3 weeks, then aspirin alone.   #Shortness of breath Patient was noted to have chronic hypoxemic respiratory failure on 3 L nasal cannula at home.  Her last pulmonology visit was August 2021.  She presented with shortness of breath, and required 6 L of oxygen to increase oxygen to above 95%.  Still unsure about the etiology of her shortness of breath although there was evidence of atelectasis on imaging.  She has a history of lupus, which could be playing a part in this.  She was currently on Myfortic and prednisone, Trelegy, and use of incentive spirometer.  Initially was thought to be in obesity hypoventilation syndrome or obstructive sleep apnea.  She received  a one-time nebulizer treatment.  Chest x-ray was then performed which showed a lingular opacity with a read of it being atelectasis versus pneumonia versus a nodule.  ABG was done, however it was mixed with venous blood.  Chest CT was then used to  further evaluate which showed no convincing pneumonia, no pulmonary edema however did show evidence of atelectasis and suggestive of small airway disease and air trapping.     #Autoimmune hepatitis #Cirrhosis Patient is followed by [facility], and has been on Myfortic and prednisone.  She has not had a variceal screening, and was previously taking nadolol which has been stopped due to bradycardia.  #Hypertension Patient's blood pressure has ranged from 140/77 to 174/90.  However due to recent stroke, this was permissive hypertension.  We will be starting her on a 12.5 mg HCTZ, and having her following up in our outpatient clinic for further management of her high blood pressure.  #HIV In 2021, patient was noted to have HIV screenings which were positive, however further work-up revealed negative results such as a negative antibody, and  negative RNA level, and negative viral load.  This may be due to cross-reactivity with her lupus and her autoimmune hepatitis causing her positive results.  No need for further work or work-up.  Discharge Exam:   BP (!) 174/90 (BP Location: Right Wrist)   Pulse (!) 55   Temp 98.5 F (36.9 C) (Oral)   Resp 16   Ht _0  (1.676 m)   Wt (!) 142.9 kg   SpO2 98%   BMI 50.84 kg/m  Discharge exam:   Constitutional: Obese woman, using O2, in no acute distress HEENT: Normocephalic, and atraumatic Eyes: EOM are normal  Cardiovascular: Normal rate and rhythm, no gallop or friction rub.  Pulmonary: Non labored breathing on [place], no wheezing or rales  Abdominal: Soft, normal bowel sounds, non-distender, non-tender  MSK: Normal ROM  Neurological: AAOx3, no focal weakness, speech is mildly dysarthric.    Pertinent Labs, Studies, and Procedures:     Latest Ref Rng & Units 09/05/2022    9:25 AM 09/04/2022    7:13 AM 09/03/2022   12:32 PM  CBC  WBC 4.0 - 10.5 K/uL 7.8  8.4    Hemoglobin 12.0 - 15.0 g/dL 12.6  12.9  14.6   Hematocrit 36.0 - 46.0 % 39.0  40.0  43.0   Platelets 150 - 400 K/uL 175  166         Latest Ref Rng & Units 09/05/2022    9:25 AM 09/04/2022    7:13 AM 09/03/2022   12:32 PM  BMP  Glucose 70 - 99 mg/dL 215  165  177   BUN 8 - 23 mg/dL _1 Creatinine 0.44 - 1.00 mg/dL 0.78  0.84  0.80   Sodium 135 - 145 mmol/L 139  138  138   Potassium 3.5 - 5.1 mmol/L 3.6  3.6  4.1   Chloride 98 - 111 mmol/L 101  102  102   CO2 22 - 32 mmol/L 32  30    Calcium 8.9 - 10.3 mg/dL 9.2  9.0       ECHOCARDIOGRAM COMPLETE BUBBLE STUDY  Result Date: 09/04/2022    ECHOCARDIOGRAM REPORT   Patient Name:   Zandrea SMITH-MCIVER Date of Exam: 09/04/2022 Medical Rec #:  [id number]            Height:       66.0 in Accession #:  [id number]           Weight:       315.0 lb Date of Birth:  March 01, 1961            BSA:          2.426 m Patient Age:    60 years             BP:           154/57 mmHg  Patient Gender: F                    HR:           55 bpm. Exam Location:  Inpatient Procedure: 2D Echo, Cardiac Doppler, Color Doppler, Intracardiac Opacification            Agent and Saline Contrast Bubble Study Indications:    Stroke  History:        Patient has prior history of Echocardiogram examinations, most                 recent 09/02/2020. COPD, Arrythmias:Bradycardia,                 Signs/Symptoms:Shortness of Breath and Dyspnea; Risk                 Factors:Current Smoker. Lupus.  Sonographer:    Merrie Roof RDCS Referring Phys: [id number] [place]  1. No apical thrombus with Definity contrast. Left ventricular ejection fraction, by estimation, is 65 to 70%. The left ventricle has normal function. The left ventricle has no regional wall motion abnormalities. There is moderate left ventricular hypertrophy. Left ventricular diastolic parameters are consistent with Grade I diastolic dysfunction (impaired relaxation). Elevated left ventricular end-diastolic pressure.  2. Right ventricular systolic function is normal. The right ventricular size is normal.  3. Left atrial size was mildly dilated.  4. The mitral valve is abnormal. Trivial mitral valve regurgitation. Moderate mitral annular calcification.  5. The aortic valve was not well visualized. Aortic valve regurgitation is not visualized. Aortic valve mean gradient measures 9.0 mmHg.  6. The inferior vena cava is normal in size with greater than 50% respiratory variability, suggesting right atrial pressure of 3 mmHg.  7. Agitated saline contrast bubble study was negative, with no evidence of any interatrial shunt. Comparison(s): Changes from prior study are noted. 09/02/2020: LVEF 55-60%, Grade 2 DD, elevated LVEDP. FINDINGS  Left Ventricle: No apical thrombus with Definity contrast. Left ventricular ejection fraction, by estimation, is 65 to 70%. The left ventricle has normal function. The left ventricle has no regional wall motion  abnormalities. Definity contrast agent was  given IV to delineate the left ventricular endocardial borders. The left ventricular internal cavity size was normal in size. There is moderate left ventricular hypertrophy. Left ventricular diastolic parameters are consistent with Grade I diastolic dysfunction (impaired relaxation). Elevated left ventricular end-diastolic pressure. The E/e' is 21. Right Ventricle: The right ventricular size is normal. No increase in right ventricular wall thickness. Right ventricular systolic function is normal. Left Atrium: Left atrial size was mildly dilated. Right Atrium: Right atrial size was normal in size. Pericardium: There is no evidence of pericardial effusion. Mitral Valve: The mitral valve is abnormal. Moderate mitral annular calcification. Trivial mitral valve regurgitation. Tricuspid Valve: The tricuspid valve is normal in structure. Tricuspid valve regurgitation is not demonstrated. Aortic Valve: The aortic valve was not well visualized. Aortic valve regurgitation is not visualized. Aortic valve mean gradient measures 9.0 mmHg. Aortic  valve peak gradient measures 17.1 mmHg. Aortic valve area, by VTI measures 3.16 cm. Pulmonic Valve: The pulmonic valve was normal in structure. Pulmonic valve regurgitation is not visualized. Aorta: The aortic root and ascending aorta are structurally normal, with no evidence of dilitation. Venous: The inferior vena cava is normal in size with greater than 50% respiratory variability, suggesting right atrial pressure of 3 mmHg. IAS/Shunts: No atrial level shunt detected by color flow Doppler. Agitated saline contrast was given intravenously to evaluate for intracardiac shunting. Agitated saline contrast bubble study was negative, with no evidence of any interatrial shunt.  LEFT VENTRICLE PLAX 2D LVIDd:         5.10 cm   Diastology LVIDs:         3.60 cm   LV e' medial:    5.77 cm/s LV PW:         1.40 cm   LV E/e' medial:  18.5 LV IVS:         1.50 cm   LV e' lateral:   4.57 cm/s LVOT diam:     2.20 cm   LV E/e' lateral: 23.4 LV SV:         139 LV SV Index:   57 LVOT Area:     3.80 cm  RIGHT VENTRICLE RV Basal diam:  4.00 cm RV Mid diam:    3.30 cm RV S prime:     11.70 cm/s TAPSE (M-mode): 2.1 cm LEFT ATRIUM             Index        RIGHT ATRIUM           Index LA diam:        4.10 cm 1.69 cm/m   RA Area:     16.30 cm LA Vol (A2C):   56.9 ml 23.46 ml/m  RA Volume:   37.70 ml  15.54 ml/m LA Vol (A4C):   85.9 ml 35.41 ml/m LA Biplane Vol: 75.4 ml 31.08 ml/m  AORTIC VALVE AV Area (Vmax):    2.79 cm AV Area (Vmean):   2.83 cm AV Area (VTI):     3.16 cm AV Vmax:           207.00 cm/s AV Vmean:          133.000 cm/s AV VTI:            0.439 m AV Peak Grad:      17.1 mmHg AV Mean Grad:      9.0 mmHg LVOT Vmax:         152.00 cm/s LVOT Vmean:        99.100 cm/s LVOT VTI:          0.365 m LVOT/AV VTI ratio: 0.83  AORTA Ao Asc diam: 3.70 cm MITRAL VALVE MV Area (PHT): 2.48 cm     SHUNTS MV Decel Time: 306 msec     Systemic VTI:  0.36 m MV E velocity: 107.00 cm/s  Systemic Diam: 2.20 cm MV A velocity: 112.00 cm/s MV E/A ratio:  0.96 Lyman Bishop MD Electronically signed by Lyman Bishop MD Signature Date/Time: 09/04/2022/12:22:07 PM    Final    DG CHEST PORT 1 VIEW  Result Date: 09/04/2022 CLINICAL DATA:  Inpatient for cerebrovascular accident EXAM: PORTABLE CHEST 1 VIEW COMPARISON:  CT abdomen pelvis 1023, chest radiograph 05/29/2021 FINDINGS: Stable enlarged cardiac silhouette. There is an oblong density in the lingula. This area not covered on recent CT of the abdomen. Atelectasis at  the LEFT lung base on comparison CT. RIGHT lung relatively clear. No pneumothorax. No acute osseous abnormality. IMPRESSION: Lingular opacity with differential including pulmonary nodule/mass, pneumonia, or atelectasis. Consider CT of the chest for further evaluation. Electronically Signed   By: Suzy Bouchard M.D.   On: 09/04/2022 10:16   MR ANGIO HEAD WO  CONTRAST  Result Date: 09/03/2022 CLINICAL DATA:  Stroke EXAM: MRA HEAD WITHOUT CONTRAST MRA NECK WITHOUT AND WITH CONTRAST TECHNIQUE: Angiographic images of the Circle of Willis were acquired using MRA technique without intravenous contrast. Angiographic images of the neck were acquired using MRA technique without and with intravenous contrast. Carotid stenosis measurements (when applicable) are obtained utilizing NASCET criteria, using the distal internal carotid diameter as the denominator. CONTRAST:  10 cc Vueway COMPARISON:  Same-day brain MRI FINDINGS: MRA HEAD FINDINGS Anterior circulation: The intracranial ICAs are patent without hemodynamically significant stenosis or occlusion. The bilateral MCAs are patent without proximal stenosis or occlusion. The bilateral ACAs are patent without proximal stenosis or occlusion. There is no aneurysm or AVM. Posterior circulation: The bilateral V4 segments are patent. Basilar artery is patent. The major cerebellar arteries appear patent. The bilateral PCAs are patent, without proximal stenosis or occlusion. Bilateral posterior communicating arteries are identified. Anatomic variants: None. MRA NECK FINDINGS Aortic arch: Imaged aortic arch is normal. Origins of the major branch vessels are patent. The subclavian arteries are patent to the level imaged. Right carotid system: The right common, internal, and external carotid arteries are patent. There is probable atherosclerotic narrowing of the proximal internal carotid artery at the bifurcation resulting in estimated 30% stenosis. The distal internal carotid artery is patent. The external carotid artery is patent. There is no convincing evidence of dissection or aneurysm. Left carotid system: The left common, internal, and external carotid arteries are patent without hemodynamically significant stenosis or occlusion. There is no convincing evidence of dissection or aneurysm. Vertebral arteries: The vertebral arteries are  patent with antegrade flow. There is no convincing evidence of hemodynamically significant stenosis or occlusion. There is no convincing evidence of dissection or aneurysm. Other: None. IMPRESSION: 1. Patent intracranial vasculature without significant stenosis or occlusion. 2. 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 Otherwise patent vasculature of the neck Electronically Signed   By: Valetta Mole M.D.   On: 09/03/2022 19:40   MR ANGIO NECK W WO CONTRAST  Result Date: 09/03/2022 CLINICAL DATA:  Stroke EXAM: MRA HEAD WITHOUT CONTRAST MRA NECK WITHOUT AND WITH CONTRAST TECHNIQUE: Angiographic images of the Circle of Willis were acquired using MRA technique without intravenous contrast. Angiographic images of the neck were acquired using MRA technique without and with intravenous contrast. Carotid stenosis measurements (when applicable) are obtained utilizing NASCET criteria, using the distal internal carotid diameter as the denominator. CONTRAST:  10 cc Vueway COMPARISON:  Same-day brain MRI FINDINGS: MRA HEAD FINDINGS Anterior circulation: The intracranial ICAs are patent without hemodynamically significant stenosis or occlusion. The bilateral MCAs are patent without proximal stenosis or occlusion. The bilateral ACAs are patent without proximal stenosis or occlusion. There is no aneurysm or AVM. Posterior circulation: The bilateral V4 segments are patent. Basilar artery is patent. The major cerebellar arteries appear patent. The bilateral PCAs are patent, without proximal stenosis or occlusion. Bilateral posterior communicating arteries are identified. Anatomic variants: None. MRA NECK FINDINGS Aortic arch: Imaged aortic arch is normal. Origins of the major branch vessels are patent. The subclavian arteries are patent to the level imaged. Right carotid  system: The right common, internal, and external carotid arteries are patent. There is  probable atherosclerotic narrowing of the proximal internal carotid artery at the bifurcation resulting in estimated 30% stenosis. The distal internal carotid artery is patent. The external carotid artery is patent. There is no convincing evidence of dissection or aneurysm. Left carotid system: The left common, internal, and external carotid arteries are patent without hemodynamically significant stenosis or occlusion. There is no convincing evidence of dissection or aneurysm. Vertebral arteries: The vertebral arteries are patent with antegrade flow. There is no convincing evidence of hemodynamically significant stenosis or occlusion. There is no convincing evidence of dissection or aneurysm. Other: None. IMPRESSION: 1. Patent intracranial vasculature without significant stenosis or occlusion. 2. 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 Otherwise patent vasculature of the neck Electronically Signed   By: Valetta Mole M.D.   On: 09/03/2022 19:40   MR BRAIN WO CONTRAST  Result Date: 09/03/2022 CLINICAL DATA:  Slurred speech EXAM: MRI HEAD WITHOUT CONTRAST TECHNIQUE: Multiplanar, multiecho pulse sequences of the brain and surrounding structures were obtained without intravenous contrast. COMPARISON:  MRI Brain 11/13/19 FINDINGS: Brain: Area of diffusion restriction in the corona radiata on the left (series 2, image 29) has a correlate on the ADC map and along the T2/FLAIR weighted sequences) and is worrisome for an acute infarct. Redemonstrated is a calcified meningioma along the left frontoparietal convexity. Sequela of moderate chronic microvascular ischemic change. Vascular: Normal flow voids. Skull and upper cervical spine: Normal marrow signal. Sinuses/Orbits: Small mucous retention cyst in the left maxillary sinus. Left-sided lens replacement. Other: None. IMPRESSION: Acute infarct in the corona radiata on the left. No evidence of hemorrhage.  Findings were discussed with Dr. Percell Miller on 09/03/22 at 1:30 PM. Electronically Signed   By: Marin Roberts M.D.   On: 09/03/2022 13:32     Discharge Instructions: Discharge Instructions     Ambulatory referral to Neurology   Complete by: As directed    Follow up with Dr. Leta Baptist at [facility] in 4-6 weeks.  Patient is Dr. Gladstone Lighter patient in the past. Thanks.   Call MD for:  difficulty breathing, headache or visual disturbances   Complete by: As directed    Call MD for:  persistant nausea and vomiting   Complete by: As directed    Call MD for:  temperature >100.4   Complete by: As directed    Diet - low sodium heart healthy   Complete by: As directed    Increase activity slowly   Complete by: As directed        Signed: Drucie Opitz, MD 09/05/2022, 12:11 PM   Pager: [phone]

## 2022-09-05 NOTE — Progress Notes (Signed)
Pt declined BIPAP at this time

## 2022-09-05 NOTE — TOC Transition Note (Signed)
Transition of Care [facility]) - CM/SW Discharge Note   Patient Details  Name: Monica Ellis MRN: [id number] Date of Birth: [birth year]  [facility] CM/SW Contact:  Carles Collet, RN Phone Number: 09/05/2022, 1:26 PM   Clinical Narrative:    Spoke to patient over the phone. She verified address she will return to. Verified that she has oxygen concentrator at address and her mother will be there when she gets home.  PTAR forms printed to unit. PTAR called, bedside nurse notified by secure message.          Patient Goals and CMS Choice        Discharge Placement                       Discharge Plan and Services                                     Social Determinants of Health (SDOH) Interventions     Readmission Risk Interventions     No data to display

## 2022-09-05 NOTE — Plan of Care (Signed)
  Problem: Clinical Measurements: Goal: Respiratory complications will improve 09/05/2022 1329 by Keary Waterson, Godfrey Pick, RN Outcome: Adequate for Discharge 09/05/2022 1318 by Clive Parcel, Godfrey Pick, RN Outcome: Adequate for Discharge  Baseline at [facility]

## 2022-09-05 NOTE — Plan of Care (Signed)
  Problem: Health Behavior/Discharge Planning: Goal: Ability to identify and utilize available resources and services will improve Outcome: Adequate for Discharge Goal: Ability to manage health-related needs will improve Outcome: Adequate for Discharge [facility] home via [facility]

## 2022-09-07 LAB — HIV-1/2 AB - DIFFERENTIATION
HIV 1 Ab: NONREACTIVE
HIV 2 Ab: NONREACTIVE
Note: NEGATIVE

## 2022-09-07 LAB — HIV-1/HIV-2 QUALITATIVE RNA
Final Interpretation: NEGATIVE
HIV-1 RNA, Qualitative: NONREACTIVE
HIV-2 RNA, Qualitative: NONREACTIVE

## 2022-09-20 ENCOUNTER — Encounter: Payer: Medicare Other | Admitting: Student

## 2022-09-20 NOTE — Progress Notes (Deleted)
1.  Oxygen requirements, at baseline she uses 3L. Patient will need an outpatient referral for pulmonology or a follow up with her pulmonologist, please make sure she has an appointment set.    We have started her on HCTZ low dose (and stopped nadolol) to help with blood pressure management, please assess if more anti-hypertensive is necessary.    2.  Labs / imaging needed at time of follow-up: BMP after initiating new anti-hypertensive   3.  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  MRI head and neck was done which showed right ICA 30% stenosis.  2D echo showed an EF of 65 to 70% without shunt, HbA1c was 5.6, UDS was negative as well.  Lipid panel showed an LDL of 61.  She had not been on any antithrombotic medications before, and was started on aspirin 81 mg daily and clopidogrel 75 mg daily.  Continue this therapy for 3 weeks, then aspirin alone

## 2022-09-24 ENCOUNTER — Other Ambulatory Visit: Payer: Self-pay | Admitting: Student

## 2022-10-06 ENCOUNTER — Inpatient Hospital Stay: Payer: Medicare Other | Admitting: Diagnostic Neuroimaging

## 2023-07-07 ENCOUNTER — Other Ambulatory Visit: Payer: Self-pay | Admitting: Student

## 2023-07-08 NOTE — Telephone Encounter (Signed)
Call to [facility] informed nota patient of the [facility].  Pharmacy to forward to patient's PCP.

## 2023-08-15 ENCOUNTER — Emergency Department (HOSPITAL_COMMUNITY)
Admission: EM | Admit: 2023-08-15 | Discharge: 2023-08-16 | Disposition: A | Discharge status: Home / Self Care | Payer: 59 | Attending: Emergency Medicine | Admitting: Emergency Medicine

## 2023-08-15 ENCOUNTER — Other Ambulatory Visit: Payer: Self-pay

## 2023-08-15 DIAGNOSIS — E876 Hypokalemia: Secondary | ICD-10-CM | POA: Diagnosis not present

## 2023-08-15 DIAGNOSIS — R42 Dizziness and giddiness: Secondary | ICD-10-CM | POA: Diagnosis present

## 2023-08-15 DIAGNOSIS — R0602 Shortness of breath: Secondary | ICD-10-CM | POA: Insufficient documentation

## 2023-08-15 DIAGNOSIS — M542 Cervicalgia: Secondary | ICD-10-CM | POA: Insufficient documentation

## 2023-08-15 DIAGNOSIS — Z7902 Long term (current) use of antithrombotics/antiplatelets: Secondary | ICD-10-CM | POA: Diagnosis not present

## 2023-08-15 DIAGNOSIS — Z794 Long term (current) use of insulin: Secondary | ICD-10-CM | POA: Insufficient documentation

## 2023-08-15 DIAGNOSIS — Z79899 Other long term (current) drug therapy: Secondary | ICD-10-CM | POA: Insufficient documentation

## 2023-08-15 DIAGNOSIS — R600 Localized edema: Secondary | ICD-10-CM | POA: Insufficient documentation

## 2023-08-15 DIAGNOSIS — Z7982 Long term (current) use of aspirin: Secondary | ICD-10-CM | POA: Diagnosis not present

## 2023-08-15 LAB — URINALYSIS, ROUTINE W REFLEX MICROSCOPIC
Bilirubin Urine: NEGATIVE
Glucose, UA: NEGATIVE mg/dL
Ketones, ur: NEGATIVE mg/dL
Leukocytes,Ua: NEGATIVE
Nitrite: NEGATIVE
Protein, ur: 100 mg/dL — AB
Specific Gravity, Urine: 1.017 (ref 1.005–1.030)
pH: 6 (ref 5.0–8.0)

## 2023-08-15 LAB — CBC
HCT: 43.8 % (ref 36.0–46.0)
Hemoglobin: 13.6 g/dL (ref 12.0–15.0)
MCH: 30 pg (ref 26.0–34.0)
MCHC: 31.1 g/dL (ref 30.0–36.0)
MCV: 96.5 fL (ref 80.0–100.0)
Platelets: 202 10*3/uL (ref 150–400)
RBC: 4.54 MIL/uL (ref 3.87–5.11)
RDW: 14.6 % (ref 11.5–15.5)
WBC: 9.7 10*3/uL (ref 4.0–10.5)
nRBC: 0 % (ref 0.0–0.2)

## 2023-08-15 LAB — BASIC METABOLIC PANEL
Anion gap: 10 (ref 5–15)
BUN: 18 mg/dL (ref 8–23)
CO2: 33 mmol/L — ABNORMAL HIGH (ref 22–32)
Calcium: 9.8 mg/dL (ref 8.9–10.3)
Chloride: 96 mmol/L — ABNORMAL LOW (ref 98–111)
Creatinine, Ser: 0.87 mg/dL (ref 0.44–1.00)
GFR, Estimated: >60 mL/min (ref >60)
Glucose, Bld: 84 mg/dL (ref 70–99)
Potassium: 3.1 mmol/L — ABNORMAL LOW (ref 3.5–5.1)
Sodium: 139 mmol/L (ref 135–145)

## 2023-08-15 LAB — CBG MONITORING, ED
Glucose-Capillary: 63 mg/dL — ABNORMAL LOW (ref 70–99)
Glucose-Capillary: 95 mg/dL (ref 70–99)
Glucose-Capillary: 89 mg/dL (ref 70–99)
Glucose-Capillary: 74 mg/dL (ref 70–99)
Glucose-Capillary: 71 mg/dL (ref 70–99)

## 2023-08-15 MED ORDER — POTASSIUM CHLORIDE CRYS ER 20 MEQ PO TBCR
20.0000 meq | EXTENDED_RELEASE_TABLET | Freq: Once | ORAL | Status: AC
  Administered 2023-08-16: 20 meq via ORAL
  Filled 2023-08-15: qty 1

## 2023-08-15 MED ORDER — DIPHENHYDRAMINE HCL 50 MG/ML IJ SOLN
50.0000 mg | Freq: Once | INTRAMUSCULAR | Status: AC
  Filled 2023-08-15: qty 1

## 2023-08-15 MED ORDER — OXYCODONE HCL 5 MG PO TABS
10.0000 mg | ORAL_TABLET | Freq: Once | ORAL | Status: AC
  Administered 2023-08-16: 10 mg via ORAL
  Filled 2023-08-15: qty 2

## 2023-08-15 MED ORDER — MYCOPHENOLATE SODIUM 180 MG PO TBEC
1080.0000 mg | DELAYED_RELEASE_TABLET | Freq: Once | ORAL | Status: AC
  Administered 2023-08-16: 1080 mg via ORAL
  Filled 2023-08-15: qty 6

## 2023-08-15 MED ORDER — DIPHENHYDRAMINE HCL 25 MG PO CAPS
50.0000 mg | ORAL_CAPSULE | Freq: Once | ORAL | Status: AC
  Administered 2023-08-16: 50 mg via ORAL
  Filled 2023-08-15: qty 2

## 2023-08-15 MED ORDER — METHYLPREDNISOLONE SODIUM SUCC 40 MG IJ SOLR
40.0000 mg | Freq: Once | INTRAMUSCULAR | Status: AC
  Administered 2023-08-16: 40 mg via INTRAVENOUS
  Filled 2023-08-15: qty 1

## 2023-08-15 NOTE — ED Provider Notes (Signed)
[place] EMERGENCY DEPARTMENT AT [facility] Provider Note   CSN: [id number] Arrival date & time: 08/15/23  1458     History {Add pertinent medical, surgical, social history, OB history to HPI:1} Chief Complaint  Patient presents with   Dizziness    Monica Ellis is a 62 y.o. female.  The history is provided by the patient and medical records.  Dizziness Monica Ellis is a 62 y.o. female who presents to the Emergency Department complaining of *** GI [facility] last Tuesday. Hx/o lupus with autoimmune hepatitis, stage IV cirrhosis For the last month has severe nausea, poor sleep. Night sweats. Has pain to right neck - comes and goes.  Feels swollen.  Has lightheadedness. GI planned to check neck vein with [place].  Takes betablocker. Pain is throbbing, dull.   No difficulty eating.  Comes and goes.  Feels weak. Feels tight.  Feels like vertigo and feels like pass out.   No chest pain.  Temp to 99 for one week.  Has increased sob from baseline.  Cough when nauseated.  No AP, v/d.  Has chronic edema - baseline to improved from basleine.  On oxygen Lupus shrinking lung syndrome    Home Medications Prior to Admission medications   Medication Sig Start Date End Date Taking? Authorizing Provider  ascorbic acid (VITAMIN C) 500 MG tablet Take 2,000 mg by mouth daily.    [provider]  aspirin 81 MG chewable tablet Chew 1 tablet (81 mg total) by mouth daily. 09/06/22   Nooruddin, Jason Fila, MD  busPIRone (BUSPAR) 10 MG tablet Take 10 mg by mouth 2 (two) times daily. 04/05/22   [provider]  Cholecalciferol 10 MCG (400 UNIT) CAPS Take 800 Units by mouth daily.    [provider]  clopidogrel (PLAVIX) 75 MG tablet Take 1 tablet (75 mg total) by mouth daily. 09/06/22   Nooruddin, Jason Fila, MD  ENULOSE 10 GM/15ML SOLN Take by mouth. Patient not taking: Reported on 09/03/2022 04/20/22   [provider]  hydrochlorothiazide (HYDRODIURIL) 12.5 MG  tablet Take 1 tablet (12.5 mg total) by mouth daily. 09/05/22   Nooruddin, Jason Fila, MD  insulin lispro protamine-lispro (HUMALOG 75/25 MIX) (75-25) 100 UNIT/ML SUSP injection Inject 120 Units into the skin 3 (three) times daily with meals. 06/17/20   Zannie Cove, MD  lactulose (CHRONULAC) 10 GM/15ML solution Take 30 mLs by mouth daily as needed for constipation. 11/22/19   [provider]  magnesium 30 MG tablet Take 30 mg by mouth at bedtime.     [provider]  Multiple Vitamin (MULTIVITAMIN) tablet Take 1 tablet by mouth daily.     [provider]  mycophenolate (MYFORTIC) 360 MG TBEC EC tablet Take 1,080 mg by mouth 2 (two) times daily. 05/13/20   [provider]  Omega-3 Fatty Acids (FISH OIL PO) Take 1 capsule by mouth every evening.     [provider]  ondansetron (ZOFRAN-ODT) 4 MG disintegrating tablet Take 1 tablet (4 mg total) by mouth every 8 (eight) hours as needed for nausea or vomiting. 08/15/22   Sabas Sous, MD  [facility] ULTRA test strip USE TO TEST SUGARS 5 TIMES DAILY 07/09/22   [provider]  oxyCODONE (OXYCONTIN) 10 mg 12 hr tablet Take 10 mg by mouth every 6 (six) hours as needed for pain.    [provider]  potassium chloride SA (KLOR-CON) 20 MEQ tablet Take 2 tablets (40 mEq total) by mouth daily. 05/31/21 09/03/22  Lynn Ito, MD  predniSONE (DELTASONE) 5 MG tablet Take 10 mg by mouth daily with breakfast.    [provider]  promethazine (PHENERGAN) 25 MG tablet Take 12.5-25 mg by mouth every 6 (six) hours as needed. 08/11/22   [provider]  TURMERIC PO Take 400 mg by mouth daily.     [provider]  zolpidem (AMBIEN) 10 MG tablet Take 10 mg by mouth at bedtime. 05/15/19   [provider]      Allergies    Ace inhibitors, Imuran [azathioprine], Lisinopril, Protonix [pantoprazole], Tramadol, Iodinated contrast media, Iodine, and Omeprazole    Review of Systems   Review of  Systems  Neurological:  Positive for dizziness.    Physical Exam Updated Vital Signs BP (!) 156/120   Pulse 63   Temp 98.7 F (37.1 C)   Resp 18   Ht 5\' 6"  (1.676 m)   Wt (!) 142 kg   SpO2 98%   BMI 50.53 kg/m  Physical Exam  ED Results / Procedures / Treatments   Labs (all labs ordered are listed, but only abnormal results are displayed) Labs Reviewed  BASIC METABOLIC PANEL - Abnormal; Notable for the following components:      Result Value   Potassium 3.1 (*)    Chloride 96 (*)    CO2 33 (*)    All other components within normal limits  URINALYSIS, ROUTINE W REFLEX MICROSCOPIC - Abnormal; Notable for the following components:   APPearance HAZY (*)    Hgb urine dipstick MODERATE (*)    Protein, ur 100 (*)    Bacteria, UA RARE (*)    All other components within normal limits  CBG MONITORING, ED - Abnormal; Notable for the following components:   Glucose-Capillary 63 (*)    All other components within normal limits  CBC  CBG MONITORING, ED  CBG MONITORING, ED  CBG MONITORING, ED  CBG MONITORING, ED    EKG EKG Interpretation Date/Time:  Monday August 15 2023 15:28:02 EDT Ventricular Rate:  72 PR Interval:  166 QRS Duration:  88 QT Interval:  405 QTC Calculation: 444 R Axis:   -19  Text Interpretation: Sinus rhythm Borderline left axis deviation Low voltage, precordial leads Borderline T abnormalities, inferior leads Baseline wander in lead(s) V6 Confirmed by Tilden Fossa [phone]) on 08/15/2023 11:08:41 PM  Radiology No results found.  Procedures Procedures  {Document cardiac monitor, telemetry assessment procedure when appropriate:1}  Medications Ordered in ED Medications - No data to display  ED Course/ Medical Decision Making/ A&P   {   Click here for ABCD2, HEART and other calculatorsREFRESH Note before signing :1}                              Medical Decision Making Amount and/or Complexity of Data Reviewed Labs:  ordered.   ***  {Document critical care time when appropriate:1} {Document review of labs and clinical decision tools ie heart score, Chads2Vasc2 etc:1}  {Document your independent review of radiology images, and any outside records:1} {Document your discussion with family members, caretakers, and with consultants:1} {Document social determinants of health affecting pt's care:1} {Document your decision making why or why not admission, treatments were needed:1} Final Clinical Impression(s) / ED Diagnoses Final diagnoses:  None    Rx / DC Orders ED Discharge Orders     None

## 2023-08-15 NOTE — ED Triage Notes (Signed)
Pt BIB EMS from home c/o dizziness, nausea x 2 days and chronic neck pain

## 2023-08-16 ENCOUNTER — Encounter (HOSPITAL_COMMUNITY): Payer: Self-pay

## 2023-08-16 ENCOUNTER — Emergency Department (HOSPITAL_COMMUNITY): Payer: 59

## 2023-08-16 LAB — BRAIN NATRIURETIC PEPTIDE: B Natriuretic Peptide: 69.2 pg/mL (ref 0.0–100.0)

## 2023-08-16 LAB — HEPATIC FUNCTION PANEL
ALT: 35 U/L (ref 0–44)
AST: 32 U/L (ref 15–41)
Albumin: 4.3 g/dL (ref 3.5–5.0)
Alkaline Phosphatase: 36 U/L — ABNORMAL LOW (ref 38–126)
Bilirubin, Direct: 0.1 mg/dL (ref 0.0–0.2)
Indirect Bilirubin: 0.9 mg/dL (ref 0.3–0.9)
Total Bilirubin: 1 mg/dL (ref 0.3–1.2)
Total Protein: 10.3 g/dL — ABNORMAL HIGH (ref 6.5–8.1)

## 2023-08-16 MED ORDER — IOHEXOL 350 MG/ML SOLN
75.0000 mL | Freq: Once | INTRAVENOUS | Status: AC | PRN
  Administered 2023-08-16: 75 mL via INTRAVENOUS

## 2023-08-16 MED ORDER — SODIUM CHLORIDE (PF) 0.9 % IJ SOLN
INTRAMUSCULAR | Status: AC
  Filled 2023-08-16: qty 50

## 2023-08-16 NOTE — ED Notes (Signed)
Pt reports that friend will be transporting her home, and that she will no longer need [facility] transport services. Results discussed with patient by EDP provider. All questions answered. Discharge instructions and follow up information reviewed with patient. Patient verbalized understanding. No acute distress noted. Escorted to ED lobby for dispo. Stable condition.

## 2023-08-16 NOTE — ED Notes (Signed)
[facility] contacted for ride home

## 2023-08-23 ENCOUNTER — Inpatient Hospital Stay (HOSPITAL_COMMUNITY)
Admission: EM | Admit: 2023-08-23 | Discharge: 2023-08-29 | DRG: 189 | Disposition: A | Discharge status: Home Health | Payer: 59 | Attending: Family Medicine | Admitting: Family Medicine

## 2023-08-23 ENCOUNTER — Emergency Department (HOSPITAL_COMMUNITY): Payer: 59

## 2023-08-23 ENCOUNTER — Encounter (HOSPITAL_COMMUNITY): Payer: Self-pay | Admitting: Student

## 2023-08-23 ENCOUNTER — Inpatient Hospital Stay (HOSPITAL_COMMUNITY)
Admit: 2023-08-23 | Discharge: 2023-08-23 | Disposition: A | Discharge status: Home / Self Care | Payer: 59 | Attending: Family Medicine | Admitting: Family Medicine

## 2023-08-23 ENCOUNTER — Other Ambulatory Visit: Payer: Self-pay

## 2023-08-23 DIAGNOSIS — M7989 Other specified soft tissue disorders: Secondary | ICD-10-CM | POA: Diagnosis not present

## 2023-08-23 DIAGNOSIS — Z9049 Acquired absence of other specified parts of digestive tract: Secondary | ICD-10-CM

## 2023-08-23 DIAGNOSIS — Z8616 Personal history of COVID-19: Secondary | ICD-10-CM

## 2023-08-23 DIAGNOSIS — K219 Gastro-esophageal reflux disease without esophagitis: Secondary | ICD-10-CM | POA: Diagnosis present

## 2023-08-23 DIAGNOSIS — J9620 Acute and chronic respiratory failure, unspecified whether with hypoxia or hypercapnia: Secondary | ICD-10-CM | POA: Diagnosis present

## 2023-08-23 DIAGNOSIS — I700 Atherosclerosis of aorta: Secondary | ICD-10-CM | POA: Diagnosis present

## 2023-08-23 DIAGNOSIS — Z794 Long term (current) use of insulin: Secondary | ICD-10-CM

## 2023-08-23 DIAGNOSIS — Z8673 Personal history of transient ischemic attack (TIA), and cerebral infarction without residual deficits: Secondary | ICD-10-CM

## 2023-08-23 DIAGNOSIS — R0609 Other forms of dyspnea: Secondary | ICD-10-CM | POA: Diagnosis not present

## 2023-08-23 DIAGNOSIS — Z66 Do not resuscitate: Secondary | ICD-10-CM | POA: Diagnosis present

## 2023-08-23 DIAGNOSIS — E1165 Type 2 diabetes mellitus with hyperglycemia: Secondary | ICD-10-CM | POA: Diagnosis present

## 2023-08-23 DIAGNOSIS — Z1152 Encounter for screening for COVID-19: Secondary | ICD-10-CM | POA: Diagnosis not present

## 2023-08-23 DIAGNOSIS — Z713 Dietary counseling and surveillance: Secondary | ICD-10-CM

## 2023-08-23 DIAGNOSIS — G8929 Other chronic pain: Secondary | ICD-10-CM | POA: Diagnosis present

## 2023-08-23 DIAGNOSIS — I110 Hypertensive heart disease with heart failure: Secondary | ICD-10-CM | POA: Diagnosis present

## 2023-08-23 DIAGNOSIS — Z716 Tobacco abuse counseling: Secondary | ICD-10-CM

## 2023-08-23 DIAGNOSIS — J449 Chronic obstructive pulmonary disease, unspecified: Secondary | ICD-10-CM | POA: Diagnosis present

## 2023-08-23 DIAGNOSIS — I5189 Other ill-defined heart diseases: Secondary | ICD-10-CM | POA: Diagnosis not present

## 2023-08-23 DIAGNOSIS — Z888 Allergy status to other drugs, medicaments and biological substances status: Secondary | ICD-10-CM

## 2023-08-23 DIAGNOSIS — K7460 Unspecified cirrhosis of liver: Secondary | ICD-10-CM | POA: Diagnosis present

## 2023-08-23 DIAGNOSIS — R0602 Shortness of breath: Principal | ICD-10-CM

## 2023-08-23 DIAGNOSIS — K5900 Constipation, unspecified: Secondary | ICD-10-CM | POA: Diagnosis not present

## 2023-08-23 DIAGNOSIS — E876 Hypokalemia: Secondary | ICD-10-CM | POA: Diagnosis present

## 2023-08-23 DIAGNOSIS — Z6841 Body Mass Index (BMI) 40.0 and over, adult: Secondary | ICD-10-CM | POA: Diagnosis not present

## 2023-08-23 DIAGNOSIS — K754 Autoimmune hepatitis: Secondary | ICD-10-CM | POA: Diagnosis present

## 2023-08-23 DIAGNOSIS — I272 Pulmonary hypertension, unspecified: Secondary | ICD-10-CM | POA: Diagnosis not present

## 2023-08-23 DIAGNOSIS — D84821 Immunodeficiency due to drugs: Secondary | ICD-10-CM | POA: Diagnosis present

## 2023-08-23 DIAGNOSIS — E871 Hypo-osmolality and hyponatremia: Secondary | ICD-10-CM | POA: Diagnosis present

## 2023-08-23 DIAGNOSIS — Z91041 Radiographic dye allergy status: Secondary | ICD-10-CM

## 2023-08-23 DIAGNOSIS — I5033 Acute on chronic diastolic (congestive) heart failure: Secondary | ICD-10-CM | POA: Diagnosis present

## 2023-08-23 DIAGNOSIS — F17210 Nicotine dependence, cigarettes, uncomplicated: Secondary | ICD-10-CM | POA: Diagnosis present

## 2023-08-23 DIAGNOSIS — Z79899 Other long term (current) drug therapy: Secondary | ICD-10-CM

## 2023-08-23 DIAGNOSIS — F0394 Unspecified dementia, unspecified severity, with anxiety: Secondary | ICD-10-CM | POA: Diagnosis present

## 2023-08-23 DIAGNOSIS — M329 Systemic lupus erythematosus, unspecified: Secondary | ICD-10-CM | POA: Diagnosis present

## 2023-08-23 DIAGNOSIS — T380X5A Adverse effect of glucocorticoids and synthetic analogues, initial encounter: Secondary | ICD-10-CM | POA: Diagnosis present

## 2023-08-23 DIAGNOSIS — J9621 Acute and chronic respiratory failure with hypoxia: Principal | ICD-10-CM | POA: Diagnosis present

## 2023-08-23 DIAGNOSIS — R0600 Dyspnea, unspecified: Secondary | ICD-10-CM | POA: Diagnosis present

## 2023-08-23 DIAGNOSIS — G4733 Obstructive sleep apnea (adult) (pediatric): Secondary | ICD-10-CM | POA: Diagnosis present

## 2023-08-23 DIAGNOSIS — E119 Type 2 diabetes mellitus without complications: Secondary | ICD-10-CM

## 2023-08-23 DIAGNOSIS — Z9981 Dependence on supplemental oxygen: Secondary | ICD-10-CM

## 2023-08-23 DIAGNOSIS — F32A Depression, unspecified: Secondary | ICD-10-CM | POA: Diagnosis present

## 2023-08-23 DIAGNOSIS — K769 Liver disease, unspecified: Secondary | ICD-10-CM | POA: Diagnosis not present

## 2023-08-23 DIAGNOSIS — Z7902 Long term (current) use of antithrombotics/antiplatelets: Secondary | ICD-10-CM

## 2023-08-23 DIAGNOSIS — Z7952 Long term (current) use of systemic steroids: Secondary | ICD-10-CM

## 2023-08-23 LAB — I-STAT VENOUS BLOOD GAS, ED
Acid-Base Excess: 5 mmol/L — ABNORMAL HIGH (ref 0.0–2.0)
Bicarbonate: 31.9 mmol/L — ABNORMAL HIGH (ref 20.0–28.0)
Calcium, Ion: 1.15 mmol/L (ref 1.15–1.40)
HCT: 39 % (ref 36.0–46.0)
Hemoglobin: 13.3 g/dL (ref 12.0–15.0)
O2 Saturation: 99 %
Potassium: 3.3 mmol/L — ABNORMAL LOW (ref 3.5–5.1)
Sodium: 141 mmol/L (ref 135–145)
TCO2: 34 mmol/L — ABNORMAL HIGH (ref 22–32)
pCO2, Ven: 56 mmHg (ref 44–60)
pH, Ven: 7.363 (ref 7.25–7.43)
pO2, Ven: 134 mmHg — ABNORMAL HIGH (ref 32–45)

## 2023-08-23 LAB — TROPONIN I (HIGH SENSITIVITY)
Troponin I (High Sensitivity): 10 ng/L (ref <18)
Troponin I (High Sensitivity): 11 ng/L (ref <18)

## 2023-08-23 LAB — CBC WITH DIFFERENTIAL/PLATELET
Abs Immature Granulocytes: 0.07 10*3/uL (ref 0.00–0.07)
Basophils Absolute: 0.1 10*3/uL (ref 0.0–0.1)
Basophils Relative: 1 %
Eosinophils Absolute: 0.2 10*3/uL (ref 0.0–0.5)
Eosinophils Relative: 2 %
HCT: 39.7 % (ref 36.0–46.0)
Hemoglobin: 12 g/dL (ref 12.0–15.0)
Immature Granulocytes: 1 %
Lymphocytes Relative: 26 %
Lymphs Abs: 2.8 10*3/uL (ref 0.7–4.0)
MCH: 29.1 pg (ref 26.0–34.0)
MCHC: 30.2 g/dL (ref 30.0–36.0)
MCV: 96.1 fL (ref 80.0–100.0)
Monocytes Absolute: 1 10*3/uL (ref 0.1–1.0)
Monocytes Relative: 9 %
Neutro Abs: 6.7 10*3/uL (ref 1.7–7.7)
Neutrophils Relative %: 61 %
Platelets: 185 10*3/uL (ref 150–400)
RBC: 4.13 MIL/uL (ref 3.87–5.11)
RDW: 15 % (ref 11.5–15.5)
WBC: 10.8 10*3/uL — ABNORMAL HIGH (ref 4.0–10.5)
nRBC: 0 % (ref 0.0–0.2)

## 2023-08-23 LAB — COMPREHENSIVE METABOLIC PANEL
ALT: 28 U/L (ref 0–44)
AST: 26 U/L (ref 15–41)
Albumin: 3.1 g/dL — ABNORMAL LOW (ref 3.5–5.0)
Alkaline Phosphatase: 32 U/L — ABNORMAL LOW (ref 38–126)
Anion gap: 6 (ref 5–15)
BUN: 14 mg/dL (ref 8–23)
CO2: 29 mmol/L (ref 22–32)
Calcium: 8.6 mg/dL — ABNORMAL LOW (ref 8.9–10.3)
Chloride: 103 mmol/L (ref 98–111)
Creatinine, Ser: 0.88 mg/dL (ref 0.44–1.00)
GFR, Estimated: >60 mL/min (ref >60)
Glucose, Bld: 133 mg/dL — ABNORMAL HIGH (ref 70–99)
Potassium: 3.2 mmol/L — ABNORMAL LOW (ref 3.5–5.1)
Sodium: 138 mmol/L (ref 135–145)
Total Bilirubin: 0.8 mg/dL (ref 0.3–1.2)
Total Protein: 7.8 g/dL (ref 6.5–8.1)

## 2023-08-23 LAB — BRAIN NATRIURETIC PEPTIDE: B Natriuretic Peptide: 67.7 pg/mL (ref 0.0–100.0)

## 2023-08-23 LAB — CBG MONITORING, ED: Glucose-Capillary: 292 mg/dL — ABNORMAL HIGH (ref 70–99)

## 2023-08-23 LAB — D-DIMER, QUANTITATIVE: D-Dimer, Quant: 0.52 ug{FEU}/mL — ABNORMAL HIGH (ref 0.00–0.50)

## 2023-08-23 LAB — RESP PANEL BY RT-PCR (RSV, FLU A&B, COVID)  RVPGX2
Influenza A by PCR: NEGATIVE
Influenza B by PCR: NEGATIVE
Resp Syncytial Virus by PCR: NEGATIVE
SARS Coronavirus 2 by RT PCR: NEGATIVE

## 2023-08-23 LAB — GLUCOSE, CAPILLARY: Glucose-Capillary: 249 mg/dL — ABNORMAL HIGH (ref 70–99)

## 2023-08-23 MED ORDER — ASPIRIN 81 MG PO CHEW
81.0000 mg | CHEWABLE_TABLET | Freq: Every day | ORAL | Status: DC
  Administered 2023-08-24 – 2023-08-29 (×6): 81 mg via ORAL
  Filled 2023-08-23 (×6): qty 1

## 2023-08-23 MED ORDER — AMLODIPINE BESYLATE 10 MG PO TABS
10.0000 mg | ORAL_TABLET | Freq: Every day | ORAL | Status: DC
  Administered 2023-08-23 – 2023-08-29 (×7): 10 mg via ORAL
  Filled 2023-08-23 (×4): qty 1
  Filled 2023-08-23: qty 2
  Filled 2023-08-23 (×2): qty 1

## 2023-08-23 MED ORDER — ACETAMINOPHEN 325 MG PO TABS
650.0000 mg | ORAL_TABLET | Freq: Four times a day (QID) | ORAL | Status: DC | PRN
  Filled 2023-08-23: qty 2

## 2023-08-23 MED ORDER — NADOLOL 40 MG PO TABS
40.0000 mg | ORAL_TABLET | Freq: Two times a day (BID) | ORAL | Status: DC
  Administered 2023-08-23 – 2023-08-25 (×4): 40 mg via ORAL
  Filled 2023-08-23 (×5): qty 1

## 2023-08-23 MED ORDER — METHYLPREDNISOLONE SODIUM SUCC 40 MG IJ SOLR
40.0000 mg | Freq: Once | INTRAMUSCULAR | Status: AC
  Administered 2023-08-23: 40 mg via INTRAVENOUS
  Filled 2023-08-23: qty 1

## 2023-08-23 MED ORDER — INSULIN GLARGINE-YFGN 100 UNIT/ML ~~LOC~~ SOLN
20.0000 [IU] | Freq: Every day | SUBCUTANEOUS | Status: DC
  Administered 2023-08-23 – 2023-08-24 (×2): 20 [IU] via SUBCUTANEOUS
  Filled 2023-08-23 (×2): qty 0.2

## 2023-08-23 MED ORDER — DIPHENHYDRAMINE HCL 50 MG/ML IJ SOLN: 50.0000 mg | Freq: Once | INTRAMUSCULAR | Status: AC

## 2023-08-23 MED ORDER — CLOPIDOGREL BISULFATE 75 MG PO TABS
75.0000 mg | ORAL_TABLET | Freq: Every day | ORAL | Status: DC
  Administered 2023-08-24 – 2023-08-29 (×6): 75 mg via ORAL
  Filled 2023-08-23 (×6): qty 1

## 2023-08-23 MED ORDER — NICOTINE 14 MG/24HR TD PT24
14.0000 mg | MEDICATED_PATCH | Freq: Every day | TRANSDERMAL | Status: DC
  Filled 2023-08-23 (×7): qty 1

## 2023-08-23 MED ORDER — MYCOPHENOLATE SODIUM 180 MG PO TBEC
1080.0000 mg | DELAYED_RELEASE_TABLET | Freq: Two times a day (BID) | ORAL | Status: DC
  Administered 2023-08-23 – 2023-08-29 (×12): 1080 mg via ORAL
  Filled 2023-08-23 (×13): qty 6

## 2023-08-23 MED ORDER — HYDROCHLOROTHIAZIDE 25 MG PO TABS
12.5000 mg | ORAL_TABLET | Freq: Every day | ORAL | Status: DC
  Administered 2023-08-24 – 2023-08-25 (×2): 12.5 mg via ORAL
  Filled 2023-08-23 (×2): qty 1

## 2023-08-23 MED ORDER — ENOXAPARIN SODIUM 40 MG/0.4ML IJ SOSY
40.0000 mg | PREFILLED_SYRINGE | INTRAMUSCULAR | Status: DC
  Administered 2023-08-24: 40 mg via SUBCUTANEOUS
  Filled 2023-08-23: qty 0.4

## 2023-08-23 MED ORDER — OXYCODONE HCL ER 10 MG PO T12A
10.0000 mg | EXTENDED_RELEASE_TABLET | Freq: Four times a day (QID) | ORAL | Status: DC | PRN
  Administered 2023-08-23 – 2023-08-27 (×11): 10 mg via ORAL
  Filled 2023-08-23 (×11): qty 1

## 2023-08-23 MED ORDER — INSULIN ASPART 100 UNIT/ML IJ SOLN
0.0000 [IU] | Freq: Three times a day (TID) | INTRAMUSCULAR | Status: DC
  Administered 2023-08-24: 15 [IU] via SUBCUTANEOUS

## 2023-08-23 MED ORDER — LACTULOSE 10 GM/15ML PO SOLN
10.0000 g | Freq: Every day | ORAL | Status: DC
  Administered 2023-08-23: 10 g via ORAL
  Filled 2023-08-23: qty 30

## 2023-08-23 MED ORDER — LORAZEPAM 1 MG PO TABS
0.5000 mg | ORAL_TABLET | Freq: Once | ORAL | Status: AC
  Administered 2023-08-23: 0.5 mg via ORAL
  Filled 2023-08-23: qty 1

## 2023-08-23 MED ORDER — IPRATROPIUM-ALBUTEROL 0.5-2.5 (3) MG/3ML IN SOLN
3.0000 mL | Freq: Once | RESPIRATORY_TRACT | Status: AC
  Administered 2023-08-23: 3 mL via RESPIRATORY_TRACT
  Filled 2023-08-23: qty 3

## 2023-08-23 MED ORDER — BUPROPION HCL ER (XL) 150 MG PO TB24
150.0000 mg | ORAL_TABLET | Freq: Every day | ORAL | Status: DC
  Administered 2023-08-24 – 2023-08-29 (×6): 150 mg via ORAL
  Filled 2023-08-23 (×6): qty 1

## 2023-08-23 MED ORDER — INSULIN ASPART 100 UNIT/ML IJ SOLN
60.0000 [IU] | Freq: Three times a day (TID) | INTRAMUSCULAR | Status: DC
  Administered 2023-08-24: 60 [IU] via SUBCUTANEOUS

## 2023-08-23 MED ORDER — POTASSIUM CHLORIDE CRYS ER 20 MEQ PO TBCR
40.0000 meq | EXTENDED_RELEASE_TABLET | Freq: Once | ORAL | Status: AC
  Administered 2023-08-23: 40 meq via ORAL
  Filled 2023-08-23: qty 2

## 2023-08-23 MED ORDER — LACTULOSE 10 GM/15ML PO SOLN
10.0000 g | Freq: Every day | ORAL | Status: DC | PRN
  Administered 2023-08-26: 10 g via ORAL
  Filled 2023-08-23: qty 15

## 2023-08-23 MED ORDER — DIPHENHYDRAMINE HCL 25 MG PO CAPS
50.0000 mg | ORAL_CAPSULE | Freq: Once | ORAL | Status: AC
  Administered 2023-08-24: 50 mg via ORAL
  Filled 2023-08-23 (×2): qty 2

## 2023-08-23 MED ORDER — BUSPIRONE HCL 5 MG PO TABS
10.0000 mg | ORAL_TABLET | Freq: Two times a day (BID) | ORAL | Status: DC
  Administered 2023-08-23 – 2023-08-29 (×12): 10 mg via ORAL
  Filled 2023-08-23 (×12): qty 2

## 2023-08-23 MED ORDER — ONDANSETRON 4 MG PO TBDP
4.0000 mg | ORAL_TABLET | Freq: Once | ORAL | Status: AC
  Administered 2023-08-23: 4 mg via ORAL
  Filled 2023-08-23: qty 1

## 2023-08-23 MED ORDER — PREDNISONE 10 MG PO TABS
10.0000 mg | ORAL_TABLET | Freq: Every day | ORAL | Status: DC
  Administered 2023-08-24 – 2023-08-29 (×6): 10 mg via ORAL
  Filled 2023-08-23 (×6): qty 1

## 2023-08-23 NOTE — Progress Notes (Signed)
FMTS Brief Progress Note  S:Went bedside w/ Dr. Rexene Alberts to check on patient, patient resting comfortably in bed, did not wake them.   O: BP (!) 157/92 (BP Location: Left Arm)   Pulse 72   Temp 98.4 F (36.9 C) (Oral)   Resp 18   SpO2 97%   General: NAD  A/P: Dyspnea Patient comes in for dyspnea with increased O2 requirement. Plan to obtain CTA chest for R/o PE, but patient must undergo 13 hour pretreatment. Plans to start pre-treatment tonight. Patient sating well on 5 L O2. -Pretreatment for CTA Chest -Plans per day team - Orders reviewed. Labs for AM ordered, which was adjusted as needed.   Bess Kinds, MD 08/23/2023, 11:34 PM PGY-3, [facility] Night Resident  Please page [phone] with questions.

## 2023-08-23 NOTE — ED Triage Notes (Addendum)
BIB EMS for pt complaint of ShOB. Difficulty breathing woke pt from sleep. Hx of lupus and shrinking lung syndrome. Last flare of this was 2 years ago and had to have BIPAP for recovery. 90% on 3LNC, currently on NRB. On 3L [place] at home, normally

## 2023-08-23 NOTE — Assessment & Plan Note (Deleted)
3.2 on admission

## 2023-08-23 NOTE — Assessment & Plan Note (Addendum)
Pt has history of T2DM associated with her chronic use of prednisone for lupus. Pt reports use of mealtime insulin around 120 units per meal that she adjusts based on what she is eating. - HgbA1c on 9/18 - Novolog Sliding Scale Insulin - 4x daily CBG

## 2023-08-23 NOTE — Assessment & Plan Note (Addendum)
3.2 at admission. Repleted in ED.  - Check AM lab

## 2023-08-23 NOTE — ED Provider Notes (Signed)
Hx DM, HTN, lupus, on home 3L O2.  Here with SOB.  DOE.  Normally can be off O2 for 25 minutes but more SOB now.  Desatted to 70's at home.  On 5L right now.  No wheezing.  D dimer age adjusted okay.  Contrast allergy.  Getting CT.  Will need admit.   62 year old female history of autoimmune hepatitis, cirrhosis, lupus, chronic hypoxic respiratory failure on baseline 3 L of oxygen present for shortness of breath.  Since 4 AM today she is felt very short of breath.  Particularly exertion she desatted to 70s.  She is on 5 L right now satting mid to low 90s.  Slightly increased work of breathing.  I do not hear any wheezing bilaterally, she did get a DuoNeb without any significant change.  She reports history of rare lung disease and follows with [facility].  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  She also has a history of COVID pneumonitis.  D-dimer here is below age-adjusted threshold and she has contrast allergy.  Her CT scan shows increasing basilar atelectasis and scarring as well as possible pulmonary hypertension.  Given no other clear cause for her shortness of breath currently, I think she needs admission for further workup like the echocardiogram.

## 2023-08-23 NOTE — Assessment & Plan Note (Addendum)
Long standing SLE. Higher risk in immunocompromised state. - Prednisone 10 mg QAM - Mycophenolate 1080 mg BID

## 2023-08-23 NOTE — ED Provider Notes (Signed)
Patient signed out to me at 07 100 by Dr. Jacqulyn Bath pending labs and reassessment with plan for likely D-dimer.  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  The patient has been satting well on 4 L of oxygen here and I tried to down titrate to her 3 L and remained satting well.  Lungs are clear to auscultation bilaterally on arrival and heart is regular rate and rhythm, she has no recent hospitalizations, no history of blood clots, no recent surgery, no recent long travel in the car or plane.  She will have D-dimer to evaluate additionally for possible PE.  Clinical Course as of 08/23/23 1546  Tue Aug 23, 2023  1106 D-dimer age adjusted negative making PE unlikely. Will have CT non-con of chest with persistent SOB. Patient mildly anxious appearing and with history of anxiety. Will trial ativan to see if this helps with SOB. She is requesting increased O2 though does remain satting 98% on her home 3L. [VK]  1324 Patient reportedly called 911 complaining of worsening SOB. Will expedite CT and activated code medical. No improvement with ativan. Will trial duoneb. [VK]  1512 Patient signed out to Dr. Earlene Plater pending CT read with plan for likely admission for SOB. [VK]    Clinical Course User Index [VK] Rexford Maus, DO      Rexford Maus, [place] 08/23/23 1546

## 2023-08-23 NOTE — ED Provider Notes (Signed)
Emergency Department Provider Note   I have reviewed the triage vital signs and the nursing notes.   HISTORY  Chief Complaint Shortness of Breath   HPI Monica Ellis is a 62 y.o. female with PMH of DM, HTN, Lupus on 3L home O2 presents to the emergency department with shortness of breath which woke her from sleep.  She checked her oxygen which was in the low 90s.  She got up to go the bathroom, without oxygen, and when she got back to bed she was very short of breath and had O2 in the high 70s.  No fevers.  Denies chest pain.  Ultimately called EMS who found her to be hypoxemic and placed her on a nonrebreather.  She was given Solu-Medrol and route.  No nebulizer treatments.  She is not anticoagulated.  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 Respiratory: Positive shortness of breath. Gastrointestinal: No abdominal pain.  No nausea, no vomiting.  No diarrhea.  Musculoskeletal: Negative for back pain. Skin: Negative for rash. Neurological: Negative for headaches.  ____________________________________________   PHYSICAL EXAM:  VITAL SIGNS: ED Triage Vitals  Encounter Vitals Group     BP 08/23/23 0602 (!) 151/66     Pulse Rate 08/23/23 0602 61     Resp 08/23/23 0602 (!) 23     Temp 08/23/23 0602 98.5 F (36.9 C)     Temp Source 08/23/23 0602 Oral     SpO2 08/23/23 0600 100 %   Constitutional: Alert and oriented. Well appearing and in no acute distress. Eyes: Conjunctivae are normal.  Head: Atraumatic. Nose: No congestion/rhinnorhea. Mouth/Throat:  Mucous membranes are moist.   Neck: No stridor.   Cardiovascular: Normal rate, regular rhythm. Good peripheral circulation. Grossly normal heart sounds.   Respiratory: Normal respiratory effort.  No retractions. Lungs CTAB. Gastrointestinal: Soft and nontender. No distention.  Musculoskeletal: No lower extremity tenderness nor edema. No gross deformities of extremities. Neurologic:  Normal speech and language. No gross focal neurologic deficits are appreciated.  Skin:  Skin is warm, dry and intact. No rash noted.  ____________________________________________   LABS (all labs ordered are listed, but only abnormal results are displayed)  Labs Reviewed  CBC WITH DIFFERENTIAL/PLATELET - Abnormal; Notable for the following components:      Result Value   WBC 10.8 (*)    All other components within normal limits  COMPREHENSIVE METABOLIC PANEL - Abnormal; Notable for the following components:   Potassium 3.2 (*)    Glucose, Bld 133 (*)    Calcium 8.6 (*)    Albumin 3.1 (*)    Alkaline Phosphatase 32 (*)    All other components within normal limits  D-DIMER, QUANTITATIVE - Abnormal; Notable for the following components:   D-Dimer, Quant 0.52 (*)    All other components within normal limits  CBC - Abnormal; Notable for the following components:   WBC 12.3 (*)    All other components within normal limits  HEMOGLOBIN A1C - Abnormal; Notable for the following components:  Hgb A1c MFr Bld 6.1 (*)    All other components within normal limits  COMPREHENSIVE METABOLIC PANEL - Abnormal; Notable for the following components:   Sodium 133 (*)    Glucose, Bld 419 (*)    Creatinine, Ser 1.05 (*)    Calcium 8.7 (*)    Albumin 3.2 (*)    Alkaline Phosphatase 31 (*)    GFR, Estimated 59 (*)    All other components within normal limits  GLUCOSE, CAPILLARY - Abnormal; Notable for the following components:   Glucose-Capillary 249 (*)    All other components within normal limits  GLUCOSE,  CAPILLARY - Abnormal; Notable for the following components:   Glucose-Capillary 327 (*)    All other components within normal limits  GLUCOSE, CAPILLARY - Abnormal; Notable for the following components:   Glucose-Capillary 402 (*)    All other components within normal limits  GLUCOSE, CAPILLARY - Abnormal; Notable for the following components:   Glucose-Capillary 306 (*)    All other components within normal limits  GLUCOSE, CAPILLARY - Abnormal; Notable for the following components:   Glucose-Capillary 292 (*)    All other components within normal limits  GLUCOSE, CAPILLARY - Abnormal; Notable for the following components:   Glucose-Capillary 263 (*)    All other components within normal limits  GLUCOSE, CAPILLARY - Abnormal; Notable for the following components:   Glucose-Capillary 237 (*)    All other components within normal limits  CBC - Abnormal; Notable for the following components:   WBC 13.9 (*)    All other components within normal limits  SEDIMENTATION RATE - Abnormal; Notable for the following components:   Sed Rate 34 (*)    All other components within normal limits  GLUCOSE, CAPILLARY - Abnormal; Notable for the following components:   Glucose-Capillary 163 (*)    All other components within normal limits  COMPREHENSIVE METABOLIC PANEL - Abnormal; Notable for the following components:   Sodium 134 (*)    Chloride 95 (*)    Calcium 8.8 (*)    Albumin 3.4 (*)    Alkaline Phosphatase 28 (*)    All other components within normal limits  GLUCOSE, CAPILLARY - Abnormal; Notable for the following components:   Glucose-Capillary 136 (*)    All other components within normal limits  GLUCOSE, CAPILLARY - Abnormal; Notable for the following components:   Glucose-Capillary 173 (*)    All other components within normal limits  GLUCOSE, CAPILLARY - Abnormal; Notable for the following components:   Glucose-Capillary 145 (*)    All other components within normal limits  BASIC  METABOLIC PANEL - Abnormal; Notable for the following components:   Chloride 96 (*)    Glucose, Bld 181 (*)    BUN 26 (*)    Creatinine, Ser 1.06 (*)    GFR, Estimated 59 (*)    All other components within normal limits  GLUCOSE, CAPILLARY - Abnormal; Notable for the following components:   Glucose-Capillary 171 (*)    All other components within normal limits  GLUCOSE, CAPILLARY - Abnormal; Notable for the following components:   Glucose-Capillary 161 (*)    All other components within normal limits  GLUCOSE, CAPILLARY - Abnormal; Notable for the following components:   Glucose-Capillary 181 (*)    All other components within normal limits  GLUCOSE, CAPILLARY - Abnormal; Notable for the following components:   Glucose-Capillary 188 (*)    All other components within normal limits  BASIC METABOLIC PANEL - Abnormal; Notable for  the following components:   Sodium 133 (*)    Potassium 3.4 (*)    Chloride 94 (*)    Glucose, Bld 151 (*)    BUN 29 (*)    All other components within normal limits  GLUCOSE, CAPILLARY - Abnormal; Notable for the following components:   Glucose-Capillary 166 (*)    All other components within normal limits  GLUCOSE, CAPILLARY - Abnormal; Notable for the following components:   Glucose-Capillary 146 (*)    All other components within normal limits  GLUCOSE, CAPILLARY - Abnormal; Notable for the following components:   Glucose-Capillary 197 (*)    All other components within normal limits  GLUCOSE, CAPILLARY - Abnormal; Notable for the following components:   Glucose-Capillary 193 (*)    All other components within normal limits  CBC - Abnormal; Notable for the following components:   WBC 12.4 (*)    All other components within normal limits  BASIC METABOLIC PANEL - Abnormal; Notable for the following components:   Sodium 134 (*)    Potassium 3.4 (*)    Chloride 92 (*)    Glucose, Bld 182 (*)    BUN 30 (*)    Creatinine, Ser 1.05 (*)    GFR,  Estimated 59 (*)    All other components within normal limits  GLUCOSE, CAPILLARY - Abnormal; Notable for the following components:   Glucose-Capillary 120 (*)    All other components within normal limits  GLUCOSE, CAPILLARY - Abnormal; Notable for the following components:   Glucose-Capillary 153 (*)    All other components within normal limits  GLUCOSE, CAPILLARY - Abnormal; Notable for the following components:   Glucose-Capillary 189 (*)    All other components within normal limits  GLUCOSE, CAPILLARY - Abnormal; Notable for the following components:   Glucose-Capillary 189 (*)    All other components within normal limits  GLUCOSE, CAPILLARY - Abnormal; Notable for the following components:   Glucose-Capillary 211 (*)    All other components within normal limits  GLUCOSE, CAPILLARY - Abnormal; Notable for the following components:   Glucose-Capillary 192 (*)    All other components within normal limits  GLUCOSE, CAPILLARY - Abnormal; Notable for the following components:   Glucose-Capillary 232 (*)    All other components within normal limits  BASIC METABOLIC PANEL - Abnormal; Notable for the following components:   Sodium 134 (*)    Chloride 93 (*)    CO2 33 (*)    Glucose, Bld 102 (*)    BUN 29 (*)    Creatinine, Ser 1.24 (*)    GFR, Estimated 49 (*)    All other components within normal limits  CBC - Abnormal; Notable for the following components:   WBC 11.2 (*)    All other components within normal limits  GLUCOSE, CAPILLARY - Abnormal; Notable for the following components:   Glucose-Capillary 125 (*)    All other components within normal limits  GLUCOSE, CAPILLARY - Abnormal; Notable for the following components:   Glucose-Capillary 169 (*)    All other components within normal limits  I-STAT VENOUS BLOOD GAS, ED - Abnormal; Notable for the following components:   pO2, Ven 134 (*)    Bicarbonate 31.9 (*)    TCO2 34 (*)    Acid-Base Excess 5.0 (*)    Potassium  3.3 (*)    All other components within normal limits  CBG MONITORING, ED - Abnormal; Notable for the following components:   Glucose-Capillary 292 (*)  All other components within normal limits  RESP PANEL BY RT-PCR (RSV, FLU A&B, COVID)  RVPGX2  BRAIN NATRIURETIC PEPTIDE  C-REACTIVE PROTEIN  ANA W/REFLEX IF POSITIVE  ANTI-DNA ANTIBODY, DOUBLE-STRANDED  CBC  MAGNESIUM  CBC  TROPONIN I (HIGH SENSITIVITY)  TROPONIN I (HIGH SENSITIVITY)   ____________________________________________  EKG   EKG Interpretation Date/Time:  Tuesday August 23 2023 06:04:47 EDT Ventricular Rate:  61 PR Interval:  157 QRS Duration:  90 QT Interval:  429 QTC Calculation: 433 R Axis:   62  Text Interpretation: Sinus arrhythmia Low voltage, precordial leads Confirmed by Alona Bene [phone]) on 08/23/2023 6:29:36 AM        ____________________________________________   PROCEDURES  Procedure(s) performed:   Procedures  CRITICAL CARE Performed by: Maia Plan Total critical care time: 35 minutes Critical care time was exclusive of separately billable procedures and treating other patients. Critical care was necessary to treat or prevent imminent or life-threatening deterioration. Critical care was time spent personally by me on the following activities: development of treatment plan with patient and/or surrogate as well as nursing, discussions with consultants, evaluation of patient's response to treatment, examination of patient, obtaining history from patient or surrogate, ordering and performing treatments and interventions, ordering and review of laboratory studies, ordering and review of radiographic studies, pulse oximetry and re-evaluation of patient's condition.  Alona Bene, MD Emergency Medicine  ____________________________________________   INITIAL IMPRESSION / ASSESSMENT AND PLAN / ED COURSE  Pertinent labs & imaging results that were available during my care of the  patient were reviewed by me and considered in my medical decision making (see chart for details).   This patient is Presenting for Evaluation of SOB, which does require a range of treatment options, and is a complaint that involves a high risk of morbidity and mortality.  The Differential Diagnoses includes but is not exclusive to acute coronary syndrome, aortic dissection, pulmonary embolism, cardiac tamponade, community-acquired pneumonia, pericarditis, musculoskeletal chest wall pain, etc.  I did obtain Additional Historical Information from EMS.   I decided to review pertinent External Data, and in summary patient CVA in 2023.   Clinical Laboratory Tests Ordered, included no acute kidney injury.  D-dimer mildly elevated.  BNP normal.  No COVID.  Radiologic Tests Ordered, included CXR. I independently interpreted the images and agree with radiology interpretation.   Cardiac Monitor Tracing which shows NSR.    Social Determinants of Health Risk patient is a non-smoker.   Medical Decision Making: Summary:  Patient presents emergency department with acute onset shortness of breath and hypoxemia although patient was walking to the bathroom without her O2.  Currently on 4 L nasal cannula which is increased from her home oxygen.   Reevaluation with update and discussion with patient. Care transferred to day physician. Plan to follow additional labs and consider CTA PE.   Considered admission but remaining workup is pending.   Patient's presentation is most consistent with acute presentation with potential threat to life or bodily function.   Disposition: pending  ____________________________________________  FINAL CLINICAL IMPRESSION(S) / ED DIAGNOSES  Final diagnoses:  Shortness of breath  SLE (systemic lupus erythematosus related syndrome) (HCC)  Hypokalemia  Type 2 diabetes mellitus with hyperglycemia, with Tiarrah Saville-term current use of insulin (HCC)  Chronic liver disease      NEW OUTPATIENT MEDICATIONS STARTED DURING THIS VISIT:  Discharge Medication List as of 08/29/2023 11:44 AM     START taking these medications   Details  acetaminophen (TYLENOL) 325 MG tablet  Take 2 tablets (650 mg total) by mouth every 6 (six) hours as needed for mild pain (or Fever >/= 101)., Starting Mon 08/29/2023, OTC    empagliflozin (JARDIANCE) 10 MG TABS tablet Take 1 tablet (10 mg total) by mouth daily., Starting Mon 08/29/2023, Normal    furosemide (LASIX) 40 MG tablet Take 1 tablet (40 mg total) by mouth daily as needed for edema (for fluid overload)., Starting Mon 08/29/2023, Until Tue 08/28/2024 at 2359, Normal    spironolactone (ALDACTONE) 25 MG tablet Take 0.5 tablets (12.5 mg total) by mouth daily., Starting Mon 08/29/2023, Normal    sulfamethoxazole-trimethoprim (BACTRIM) 400-80 MG tablet Take 1 tablet by mouth daily., Starting Mon 08/29/2023, Normal        Note:  This document was prepared using Dragon voice recognition software and may include unintentional dictation errors.  Alona Bene, MD, [facility] Emergency Medicine    Shamieka Gullo, Arlyss Repress, MD 08/30/23 0900

## 2023-08-23 NOTE — Assessment & Plan Note (Addendum)
Pt has acute on chronic shortness of breath that began this morning. Reports that she was de-satting into the high 70% on her home monitor. Could not get back to baseline with her normal 3L. Concern for PE high, CTA ordered.  - Admit to [facility], Dr. Miquel Dunn as attending  - Pulmonary consult considered, holding off for now. - Diurese as indicated  - CTA stat  - Echocardiogram ordered - AM CMP, CBC, Mg, K - PT/OT - Strict I/Os  - Daily weights

## 2023-08-23 NOTE — Progress Notes (Signed)
Bilateral lower extremity venous duplex has been completed. Preliminary results can be found in CV Proc through chart review.  Results were given to the patient's nurse, Ndea.  08/23/23 7:09 PM Olen Cordial RVT

## 2023-08-23 NOTE — Assessment & Plan Note (Deleted)
Pt baseline is 3L [place] at home. Starting at 4 am in with shortness of breath that started around

## 2023-08-23 NOTE — ED Notes (Signed)
Increase patient oxygen to 5  Liters

## 2023-08-23 NOTE — ED Notes (Signed)
ED TO INPATIENT HANDOFF REPORT  ED Nurse Name and Phone #: Cat [phone]  S Name/Age/Gender Monica Ellis 62 y.o. female Room/Bed: 038C/038C  Code Status   Code Status: Do not attempt resuscitation (DNR) PRE-ARREST INTERVENTIONS DESIRED  Home/SNF/Other Home Patient oriented to: self, place, time, and situation Is this baseline? Yes   Triage Complete: Triage complete  Chief Complaint Dyspnea [R06.00]  Triage Note BIB EMS for pt complaint of ShOB. Difficulty breathing woke pt from sleep. Hx of lupus and shrinking lung syndrome. Last flare of this was 2 years ago and had to have BIPAP for recovery. 90% on 3LNC, currently on NRB. On 3L [place] at home, normally   Allergies Allergies  Allergen Reactions   Ace Inhibitors Swelling   Imuran [Azathioprine] Nausea And Vomiting    Severe N/V/D and AKI after starting the med.   Lisinopril Swelling and Other (See Comments)    Other reaction(s): Angioedema (ALLERGY/intolerance), Face   Protonix [Pantoprazole] Swelling    Pt states that it causes her lips to swell.    Tramadol Other (See Comments)    Other reaction(s): Mental Status Changes (intolerance) suicidal thoughts   Iodinated Contrast Media Swelling    When patient was in her 20's, swelled up all over after getting getting injection of contrast; did not have any other symptoms/ was given benadryl after that happened and had no further problems per patients/   Iodine Swelling   Omeprazole Swelling    Lips will swell    Level of Care/Admitting Diagnosis ED Disposition     ED Disposition  Admit   Condition  --   Comment  Hospital Area: [facility] [[id number]]  Level of Care: Telemetry Medical [[id number]]  May admit patient to [facility] or [facility] if equivalent level of care is available:: Yes  Covid Evaluation: Asymptomatic - no recent exposure (last 10 days) testing not required  Diagnosis: Dyspnea [241871]  Admitting Physician: Alfredo Martinez  [[id number]]  Attending Physician: Billey Co [[id number]]  Certification:: I certify this patient will need inpatient services for at least 2 midnights  Expected Medical Readiness: 08/25/2023          B Medical/Surgery History Past Medical History:  Diagnosis Date   Autoimmune hepatitis (HCC)    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   Brain tumor (HCC)    hx of   Diabetes mellitus without complication (HCC)    GERD (gastroesophageal reflux disease)    Hypertension    Lupus (HCC)    Morbid obesity (HCC)    Obesity hypoventilation syndrome (HCC)    Pneumonia    Tobacco use disorder    Past Surgical History:  Procedure Laterality Date   CHOLECYSTECTOMY     DILATION AND CURETTAGE OF UTERUS     EYE SURGERY Bilateral    laser surgery    IR TRANSCATHETER BX  11/03/2018   IR US GUIDE VASC ACCESS RIGHT  11/03/2018   IR VENOGRAM HEPATIC W HEMODYNAMIC EVALUATION  11/03/2018   RIGHT HEART CATH N/A 09/01/2020   Procedure: RIGHT HEART CATH;  Surgeon: Dolores Patty, MD;  Location: [facility] INVASIVE CV LAB;  Service: Cardiovascular;  Laterality: N/A;     A IV Location/Drains/Wounds Patient Lines/Drains/Airways Status     Active Line/Drains/Airways     Name Placement date Placement time Site Days   Peripheral IV 08/23/23 20 G 1" Left Antecubital 08/23/23  0600  Antecubital  less than 1  Intake/Output Last 24 hours No intake or output data in the 24 hours ending 08/23/23 1857  Labs/Imaging Results for orders placed or performed during the hospital encounter of 08/23/23 (from the past 48 hour(s))  CBC with Differential     Status: Abnormal   Collection Time: 08/23/23  6:18 AM  Result Value Ref Range   WBC 10.8 (H) 4.0 - 10.5 K/uL   RBC 4.13 3.87 - 5.11 MIL/uL   Hemoglobin 12.0 12.0 - 15.0 g/dL   HCT 62.1 30.8 - 65.7 %   MCV 96.1 80.0 - 100.0 fL   MCH 29.1 26.0 - 34.0 pg   MCHC 30.2 30.0 - 36.0 g/dL   RDW 84.6 96.2 - 95.2 %   Platelets 185 150 - 400 K/uL   nRBC  0.0 0.0 - 0.2 %   Neutrophils Relative % 61 %   Neutro Abs 6.7 1.7 - 7.7 K/uL   Lymphocytes Relative 26 %   Lymphs Abs 2.8 0.7 - 4.0 K/uL   Monocytes Relative 9 %   Monocytes Absolute 1.0 0.1 - 1.0 K/uL   Eosinophils Relative 2 %   Eosinophils Absolute 0.2 0.0 - 0.5 K/uL   Basophils Relative 1 %   Basophils Absolute 0.1 0.0 - 0.1 K/uL   Immature Granulocytes 1 %   Abs Immature Granulocytes 0.07 0.00 - 0.07 K/uL    Comment: Performed at [facility], [address] [place], [place] [address]  Comprehensive metabolic panel     Status: Abnormal   Collection Time: 08/23/23  6:18 AM  Result Value Ref Range   Sodium 138 135 - 145 mmol/L   Potassium 3.2 (L) 3.5 - 5.1 mmol/L   Chloride 103 98 - 111 mmol/L   CO2 29 22 - 32 mmol/L   Glucose, Bld 133 (H) 70 - 99 mg/dL    Comment: Glucose reference range applies only to samples taken after fasting for at least 8 hours.   BUN 14 8 - 23 mg/dL   Creatinine, Ser 4.40 0.44 - 1.00 mg/dL   Calcium 8.6 (L) 8.9 - 10.3 mg/dL   Total Protein 7.8 6.5 - 8.1 g/dL   Albumin 3.1 (L) 3.5 - 5.0 g/dL   AST 26 15 - 41 U/L   ALT 28 0 - 44 U/L   Alkaline Phosphatase 32 (L) 38 - 126 U/L   Total Bilirubin 0.8 0.3 - 1.2 mg/dL   GFR, Estimated >10 >27 mL/min    Comment: (NOTE) Calculated using the CKD-EPI Creatinine Equation (2021)    Anion gap 6 5 - 15    Comment: Performed at [facility], [address], [place], [place] [address]  Brain natriuretic peptide     Status: None   Collection Time: 08/23/23  6:18 AM  Result Value Ref Range   B Natriuretic Peptide 67.7 0.0 - 100.0 pg/mL    Comment: Performed at [facility], [address], [place], [place] [address]  Troponin I (High Sensitivity)     Status: None   Collection Time: 08/23/23  6:18 AM  Result Value Ref Range   Troponin I (High Sensitivity) 10 <18 ng/L    Comment: (NOTE) Elevated high sensitivity troponin I (hsTnI) values and significant  changes across serial measurements  may suggest ACS but many other  chronic and acute conditions are known to elevate hsTnI results.  Refer to the "Links" section for chest pain algorithms and additional  guidance. Performed at [facility], [address] [place], [place] [address]   Resp panel by RT-PCR (RSV, Flu A&B, Covid) Anterior Nasal Swab     Status: None   Collection Time: 08/23/23  6:18 AM   Specimen: Anterior Nasal Swab  Result Value Ref Range   SARS Coronavirus 2 by RT PCR NEGATIVE NEGATIVE   Influenza A by PCR NEGATIVE NEGATIVE   Influenza B by PCR NEGATIVE NEGATIVE    Comment: (NOTE) The Xpert Xpress SARS-CoV-2/FLU/RSV plus assay is intended as an aid in the diagnosis of influenza from Nasopharyngeal swab specimens and should not be used as a sole basis for treatment. Nasal washings and aspirates are unacceptable for Xpert Xpress SARS-CoV-2/FLU/RSV testing.  Fact Sheet for Patients: BloggerCourse.com  Fact Sheet for Healthcare Providers: SeriousBroker.it  This test is not yet approved or cleared by the [place] FDA and has been authorized for detection and/or diagnosis of SARS-CoV-2 by FDA under an Emergency Use Authorization (EUA). This EUA will remain in effect (meaning this test can be used) for the duration of the COVID-19 declaration under Section 564(b)(1) of the Act, 21 U.S.C. section 360bbb-3(b)(1), unless the authorization is terminated or revoked.     Resp Syncytial Virus by PCR NEGATIVE NEGATIVE    Comment: (NOTE) Fact Sheet for Patients: BloggerCourse.com  Fact Sheet for Healthcare Providers: SeriousBroker.it  This test is not yet approved or cleared by the [place] FDA and has been authorized for detection and/or diagnosis of SARS-CoV-2 by FDA under an Emergency Use Authorization (EUA). This EUA will remain in effect (meaning this test can be used) for the duration  of the COVID-19 declaration under Section 564(b)(1) of the Act, 21 U.S.C. section 360bbb-3(b)(1), unless the authorization is terminated or revoked.  Performed at [facility], [address] [place], [place] [address]   I-Stat venous blood gas, [facility], [facility], [facility])     Status: Abnormal   Collection Time: 08/23/23  6:49 AM  Result Value Ref Range   pH, Ven 7.363 7.25 - 7.43   pCO2, Ven 56.0 44 - 60 mmHg   pO2, Ven 134 (H) 32 - 45 mmHg   Bicarbonate 31.9 (H) 20.0 - 28.0 mmol/L   TCO2 34 (H) 22 - 32 mmol/L   O2 Saturation 99 %   Acid-Base Excess 5.0 (H) 0.0 - 2.0 mmol/L   Sodium 141 135 - 145 mmol/L   Potassium 3.3 (L) 3.5 - 5.1 mmol/L   Calcium, Ion 1.15 1.15 - 1.40 mmol/L   HCT 39.0 36.0 - 46.0 %   Hemoglobin 13.3 12.0 - 15.0 g/dL   Sample type VENOUS   Troponin I (High Sensitivity)     Status: None   Collection Time: 08/23/23  9:46 AM  Result Value Ref Range   Troponin I (High Sensitivity) 11 <18 ng/L    Comment: (NOTE) Elevated high sensitivity troponin I (hsTnI) values and significant  changes across serial measurements may suggest ACS but many other  chronic and acute conditions are known to elevate hsTnI results.  Refer to the "Links" section for chest pain algorithms and additional  guidance. Performed at [facility], [address] [place], [place] [address]   D-dimer, quantitative     Status: Abnormal   Collection Time: 08/23/23  9:46 AM  Result Value Ref Range   D-Dimer, Quant 0.52 (H) 0.00 - 0.50 ug/mL-FEU    Comment: (NOTE) At the manufacturer cut-off value of 0.5 g/mL FEU, this assay has a negative predictive value of 95-100%.This assay is  intended for use in conjunction with a clinical pretest probability (PTP) assessment model to exclude pulmonary embolism (PE) and deep venous thrombosis (DVT) in outpatients suspected of PE or DVT. Results should be correlated with clinical presentation. Performed at [facility], [address],  [place], [place] [address]   CBG monitoring, ED     Status: Abnormal   Collection Time: 08/23/23  6:35 PM  Result Value Ref Range   Glucose-Capillary 292 (H) 70 - 99 mg/dL    Comment: Glucose reference range applies only to samples taken after fasting for at least 8 hours.   CT Chest Wo Contrast  Result Date: 08/23/2023 CLINICAL DATA:  Dyspnea EXAM: CT CHEST WITHOUT CONTRAST TECHNIQUE: Multidetector CT imaging of the chest was performed following the standard protocol without IV contrast. RADIATION DOSE REDUCTION: This exam was performed according to the departmental dose-optimization program which includes automated exposure control, adjustment of the mA and/or kV according to patient size and/or use of iterative reconstruction technique. COMPARISON:  CT 09/05/2022 FINDINGS: Cardiovascular: On this non IV contrast exam, the heart is nonenlarged. No pericardial effusion. Coronary artery calcifications are seen. Please correlate for other coronary risk factors. The thoracic aorta has a normal course and caliber with scattered atherosclerotic calcified plaque. Some enlargement of the main pulmonary artery. Please correlate for any evidence of pulmonary artery hypertension. Mediastinum/Nodes: On this non IV contrast exam there is no specific abnormal lymph node enlargement seen in the axillary region, hilum or mediastinum preserved thyroid gland. Normal caliber thoracic esophagus. Lungs/Pleura: No pneumothorax or effusion. There is some bandlike opacities identified along bases likely scar or atelectasis. These are increased from the prior CT scan of October 2023 at the bases. Few areas of ground-glass and mosaic appearance of the lungs as well as on prior. Upper Abdomen: Nodular heterogeneous liver consistent with chronic liver disease. Slight nodularity seen to both adrenal glands. Unchanged from study of 2023. Musculoskeletal: Mild-to-moderate changes of the thoracic spine. Scattered heterogeneous marrow  diffusely of the bones. Please correlate for any history. IMPRESSION: Increasing basilar scarring and atelectatic changes compared to the study of October 2023. No consolidation or effusion. Evidence of chronic liver disease with nodular heterogeneous liver. Enlargement of the main pulmonary artery. Please correlate for pulmonary artery hypertension. Slightly diffuse heterogeneous marrow.  Nonspecific. Aortic Atherosclerosis (ICD10-I70.0). Electronically Signed   By: Karen Kays M.D.   On: 08/23/2023 16:03   DG Chest Portable 1 View  Result Date: 08/23/2023 CLINICAL DATA:  62 year old female with history of shortness of breath. EXAM: PORTABLE CHEST 1 VIEW COMPARISON:  Chest x-ray 08/16/2023. FINDINGS: Lung volumes are normal. No consolidative airspace disease. No pleural effusions. Chronic scarring in the region of the left lower lobe and lingula, similar to prior examinations. No pneumothorax. No evidence of pulmonary edema. Heart size is mildly enlarged. Upper mediastinal contours are within normal limits. Atherosclerotic calcifications are noted in the thoracic aorta. IMPRESSION: 1. No radiographic evidence of acute cardiopulmonary disease. 2. Aortic atherosclerosis. 3. Mild cardiomegaly. Electronically Signed   By: Trudie Reed M.D.   On: 08/23/2023 06:30    Pending Labs Unresulted Labs (From admission, onward)     Start     Ordered   08/24/23 0500  CBC  Daily,   R      08/23/23 1722   08/24/23 0500  Comprehensive metabolic panel  Tomorrow morning,   R        08/23/23 1732   08/23/23 1724  Hemoglobin A1c  Once,  R       Comments: To assess prior glycemic control    08/23/23 1723            Vitals/Pain Today's Vitals   08/23/23 1409 08/23/23 1839 08/23/23 1842 08/23/23 1845  BP: (!) 124/92  (!) 145/77 (!) 145/77  Pulse: 89   67  Resp: 20   15  Temp: 98.6 F (37 C) 98.1 F (36.7 C)    TempSrc: Oral Oral    SpO2: 97%   95%  PainSc:        Isolation Precautions No active  isolations  Medications Medications  amLODipine (NORVASC) tablet 10 mg (10 mg Oral Given 08/23/23 1842)  buPROPion (WELLBUTRIN XL) 24 hr tablet 150 mg (has no administration in time range)  busPIRone (BUSPAR) tablet 10 mg (has no administration in time range)  clopidogrel (PLAVIX) tablet 75 mg (has no administration in time range)  predniSONE (DELTASONE) tablet 10 mg (has no administration in time range)  oxyCODONE (OXYCONTIN) 12 hr tablet 10 mg (10 mg Oral Given 08/23/23 1846)  nadolol (CORGARD) tablet 40 mg (has no administration in time range)  mycophenolate (MYFORTIC) EC tablet 1,080 mg (has no administration in time range)  hydrochlorothiazide (HYDRODIURIL) tablet 12.5 mg (has no administration in time range)  enoxaparin (LOVENOX) injection 40 mg (has no administration in time range)  acetaminophen (TYLENOL) tablet 650 mg (has no administration in time range)  insulin aspart (novoLOG) injection 0-20 Units (has no administration in time range)  aspirin chewable tablet 81 mg (has no administration in time range)  lactulose (CHRONULAC) 10 GM/15ML solution 10 g (has no administration in time range)  insulin glargine-yfgn (SEMGLEE) injection 20 Units (has no administration in time range)  insulin aspart (novoLOG) injection 60 Units (has no administration in time range)  potassium chloride SA (KLOR-CON M) CR tablet 40 mEq (40 mEq Oral Given 08/23/23 0941)  LORazepam (ATIVAN) tablet 0.5 mg (0.5 mg Oral Given 08/23/23 1123)  ipratropium-albuterol (DUONEB) 0.5-2.5 (3) MG/3ML nebulizer solution 3 mL (3 mLs Nebulization Given 08/23/23 1353)  ondansetron (ZOFRAN-ODT) disintegrating tablet 4 mg (4 mg Oral Given 08/23/23 1842)    Mobility walks with person assist     Focused Assessments Pulmonary Assessment Handoff:  Lung sounds: Bilateral Breath Sounds: Clear, Diminished L Breath Sounds: Clear, Diminished R Breath Sounds: Clear, Diminished O2 Device: Nasal Cannula O2 Flow Rate (L/min): 5  L/min    R Recommendations: See Admitting Provider Note  Report given to:   Additional Notes:

## 2023-08-23 NOTE — H&P (Cosign Needed Addendum)
Hospital Admission History and Physical Service Pager: [phone]  Patient name: Monica Ellis Medical record number: [id number] Date of Birth: [birth year] Age: 62 y.o. Gender: female  Primary Care Provider: Annita Brod, MD Consultants: Rheumatology Code Status: Partial (Patient would like to be intubated temporarily but no chest compressions, medications, or shock therapy) Preferred Emergency Contact: McIver,Meggan (Daughter) [phone] (Mobile)   Chief Complaint: Shortness of breath  Assessment and Plan: [name] is a 62 y.o. female presenting with shortness of breath in the setting of chronic lupus, acute on chronic respiratory failure with hypoxia, obesity hypoventilation syndrome, chronic diastolic CHF. Differential for presentation of this includes pulmonary embolus, new onset of COPD, acute lupus flare, lupus-associated vanishing lung syndrome, flash pulmonary edema, infectious causes, obstructive sleep apnea, pulmonary hypertension, or cor pulmonale.  Assessment & Plan Shortness of breath Pt has acute on chronic shortness of breath that began this morning. Reports that she was de-satting into the high 70% on her home monitor. Could not get back to baseline with her normal 3L. Concern for PE high, CTA ordered.  - Admit to [facility], Dr. Miquel Dunn as attending  - Pulmonary consult considered, holding off for now. - Diurese as indicated  - CTA stat  - Echocardiogram ordered - AM CMP, CBC, Mg, K - PT/OT - Strict I/Os  - Daily weights   SLE (systemic lupus erythematosus related syndrome) (HCC) Long standing SLE. Higher risk in immunocompromised state. - Prednisone 10 mg QAM - Mycophenolate 1080 mg BID Hypokalemia 3.2 at admission. Repleted in ED.  - Check AM lab Type 2 diabetes mellitus with hyperglycemia, with long-term current use of insulin (HCC) Pt has history of T2DM associated with her chronic use of prednisone for lupus. Pt reports use of mealtime  insulin around 120 units per meal that she adjusts based on what she is eating. - HgbA1c on 9/18 - Novolog Sliding Scale Insulin - 4x daily CBG Chronic liver disease Pt has history of chronic liver disease. Pt does not drink alcohol.  - lactulose 10 g daily   Chronic and Stable Problems:  - Chronic Pain - Oxycontin 12 hr tabs 10 mg Q6 PRN - HTN - Amlodipine 10 mg daily, hydrochlorothiazide 12.5 daily, nadolol 40 mg BID - Hx CVA - (March 2024) - asprin 81 / plavix 75 daily - Depression, anxiety, unspecified - bupropion 150mg  daily, buspar 10 mg BID  FEN/GI: lactulose VTE Prophylaxis: Lovenox 40 q24  Disposition: Med tele    History of Present Illness:  Monica Ellis is a 63 y.o. female presenting with worsening shortness of breath that started this morning. 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   Reports that she had worsening shortness of breath this AM and could not breath comfortably on her normal 3L of oxygen this morning, was desatting to 80% on home oxygen and decided to come into the [facility].   Feels like she is having a lupus flare, has low grade "fever" to 99 with onset of nausea a month ago. Increase in constipation.  No known sick contacts. Has had COVID multiple times in the past.   Patient denies history of heart failure, has been following with cardiology.   In the ED, she had CBC with slight leukocytosis, K 3.2, d-dimer elevated, trops trended flat. CT showed increased bibasilar scarring and atelectasis without consolidation, enlarged pulmonary artery on CT but no PE.   Review Of Systems: Per HPI   Pertinent Past  Medical History: Lupus  CVA -- 6 months ago  HTN DM2 SLE Depression  Chronic pain Autoimmune hepatitis?  GERD Migraines  DDD Remainder reviewed in history tab.   Pertinent Past Surgical History: Cholecystectomy  D&C uterus  [facility]HC   Remainder reviewed in history tab.   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   Important Outpatient Medications: Vitamin C 2000 mg daily  ASA 81 Buspar 10 BID  Vit D  Plavix 75 mg daily  Lactulose  Fish oil  Hydrochlorothiazide 12.5 daily  Lispro 120 TID  Magnesium MVM Mycophenolate 360 mg BID  Ondansetron Oxycodone 10 mg q6hr prn Prednisone 10 mg daily  Zolpidem 10 mg at bedtime  Remainder reviewed in medication history.   Objective: BP (!) 124/92 (BP Location: Right Arm)   Pulse 89   Temp 98.6 F (37 C) (Oral)   Resp 20   SpO2 97%  Exam: General: Obese woman lying semi-upright in bed on [place] @ 5L. She is casually dressed and has food crumbs on her shirt. Eyes: EOMI, pupils grossly normal. Non-icteric sclera. Neck: acanthosis nigricans,  Cardiovascular: RRR, heart sounds distant. +1 mildly pitting edema in RLE. Non pitting edema in LLE. No cyanosis or clubbing in extremities. Respiratory: CTAB, good air movement in both lungs.  No accessory muscle use. Gastrointestinal: Normal bowel sounds. Mild left upper quadrant discomfort.  MSK: Pt has no acute changes to muscle strength, deconditioning present bilateral. Derm: No bruises or lesions obvious. Pt fingernails are uniform in length, pt toes are long, not well-cared for. Neuro: No focal deficits. AxO4.  Psych: Pt anxious, cites numerous psychosocial stressors (living alone, difficulties with mobility).  Labs:  CBC BMET  Recent Labs  Lab 08/23/23 0618 08/23/23 0649  WBC 10.8*  --   HGB 12.0 13.3  HCT 39.7 39.0  PLT 185  --    Recent Labs  Lab 08/23/23 0618 08/23/23 0649  NA 138 141  K 3.2* 3.3*  CL 103  --   CO2 29  --   BUN 14  --   CREATININE 0.88  --   GLUCOSE 133*  --   CALCIUM 8.6*  --     Pertinent additional labs .  EKG: My own interpretation (not copied from  electronic read) Pt has borderline bradycardia, with low voltage. the QRS complexes appear borderline abnormally long. Possible conduction delay in AV node/bundle branch.   Imaging Studies Performed:  Imaging Study (ie. Chest x-ray)  9/17 CTA w/o contrast  IMPRESSION: Increasing basilar scarring and atelectatic changes compared to the study of October 2023. No consolidation or effusion.   Evidence of chronic liver disease with nodular heterogeneous liver.   Enlargement of the main pulmonary artery. Please correlate for pulmonary artery hypertension.   Slightly diffuse heterogeneous marrow.  Nonspecific.   Aortic Atherosclerosis (ICD10-I70.0).  9/17 CXR Port: IMPRESSION: 1. No radiographic evidence of acute cardiopulmonary disease. 2. Aortic atherosclerosis. 3. Mild cardiomegaly.    Verda Cumins MD  08/23/2023, 5:03 PM PGY-1, [facility] FPTS Intern pager: [phone], text pages welcome Secure chat group [facility] Teaching Service   Upper Level Addendum:  I have seen and evaluated this patient along with Dr. Weston Settle and reviewed the above note, making necessary revisions as appropriate.  I agree with the  medical decision making and physical exam as noted above.  Alfredo Martinez, MD PGY-3 [facility]: [facility]

## 2023-08-23 NOTE — ED Notes (Addendum)
Greg with Vascular ultrasound called due to unable to reports test to MD:  Bilateral venous ultrasound preformed ,patient was negative for DVT bilaterally.

## 2023-08-23 NOTE — Progress Notes (Signed)
New Admission Note:    Arrival Method: ED stretcher Mental Orientation: a/ox4 Telemetry: box 22 Assessment: completed Skin: intact IV: left AC Pain: N/A Tubes: N/A Safety Measures: non skid socks Admission: completed Orientation: Patient has been oriented to the room, unit and staff.  Family: n/a Belongings: at bedside  Orders have been reviewed and implemented. Will continue to monitor the patient. Call light has been placed within reach and bed alarm has been activated.   Fabian Sharp BSN, RN-BC Phone number: [phone]  e

## 2023-08-24 ENCOUNTER — Inpatient Hospital Stay (HOSPITAL_COMMUNITY): Payer: 59

## 2023-08-24 DIAGNOSIS — K769 Liver disease, unspecified: Secondary | ICD-10-CM | POA: Diagnosis present

## 2023-08-24 DIAGNOSIS — R0609 Other forms of dyspnea: Secondary | ICD-10-CM

## 2023-08-24 DIAGNOSIS — R0602 Shortness of breath: Secondary | ICD-10-CM

## 2023-08-24 DIAGNOSIS — M329 Systemic lupus erythematosus, unspecified: Secondary | ICD-10-CM | POA: Diagnosis not present

## 2023-08-24 LAB — GLUCOSE, CAPILLARY
Glucose-Capillary: 327 mg/dL — ABNORMAL HIGH (ref 70–99)
Glucose-Capillary: 402 mg/dL — ABNORMAL HIGH (ref 70–99)
Glucose-Capillary: 306 mg/dL — ABNORMAL HIGH (ref 70–99)
Glucose-Capillary: 263 mg/dL — ABNORMAL HIGH (ref 70–99)
Glucose-Capillary: 292 mg/dL — ABNORMAL HIGH (ref 70–99)
Glucose-Capillary: 237 mg/dL — ABNORMAL HIGH (ref 70–99)
Glucose-Capillary: 163 mg/dL — ABNORMAL HIGH (ref 70–99)

## 2023-08-24 MED ORDER — INSULIN ASPART PROT & ASPART (70-30 MIX) 100 UNIT/ML ~~LOC~~ SUSP
30.0000 [IU] | Freq: Three times a day (TID) | SUBCUTANEOUS | Status: DC
  Filled 2023-08-24: qty 10

## 2023-08-24 MED ORDER — INSULIN ASPART 100 UNIT/ML IJ SOLN
0.0000 [IU] | Freq: Three times a day (TID) | INTRAMUSCULAR | Status: DC
  Administered 2023-08-24: 11 [IU] via SUBCUTANEOUS

## 2023-08-24 MED ORDER — INSULIN ASPART PROT & ASPART (70-30 MIX) 100 UNIT/ML ~~LOC~~ SUSP
30.0000 [IU] | Freq: Three times a day (TID) | SUBCUTANEOUS | Status: DC
  Administered 2023-08-24 – 2023-08-27 (×10): 30 [IU] via SUBCUTANEOUS

## 2023-08-24 MED ORDER — ENOXAPARIN SODIUM 80 MG/0.8ML IJ SOSY
70.0000 mg | PREFILLED_SYRINGE | INTRAMUSCULAR | Status: DC
  Administered 2023-08-25 – 2023-08-29 (×5): 70 mg via SUBCUTANEOUS
  Filled 2023-08-24 (×5): qty 0.7

## 2023-08-24 MED ORDER — INSULIN ASPART PROT & ASPART (70-30 MIX) 100 UNIT/ML ~~LOC~~ SUSP
50.0000 [IU] | Freq: Three times a day (TID) | SUBCUTANEOUS | Status: DC
  Filled 2023-08-24: qty 10

## 2023-08-24 MED ORDER — INSULIN NPH (HUMAN) (ISOPHANE) 100 UNIT/ML ~~LOC~~ SUSP: 50.0000 [IU] | Freq: Three times a day (TID) | SUBCUTANEOUS | Status: DC

## 2023-08-24 MED ORDER — IOHEXOL 350 MG/ML SOLN
75.0000 mL | Freq: Once | INTRAVENOUS | Status: AC | PRN
  Administered 2023-08-24: 75 mL via INTRAVENOUS

## 2023-08-24 NOTE — Assessment & Plan Note (Addendum)
Pt has history of T2DM associated with her chronic use of prednisone for lupus. Pt is on 75/25 insulin mix at home. The history as provided by the patient is different than the medication reconciliation from her last outpatient appointment. Pt has detailed knowledge of how she responds to insulin. - HgbA1c 6.1 on 9/18 - Today (9/18) - s/p 75 units short acting today and 20 units of semglee- have removed sliding scale and adjusted more similar to home insulin to avoid overtreating with short acting. 70/30 insulin 30 units with lunch and dinner and reassess tomorrow and adjust accordingly - 4x daily CBG

## 2023-08-24 NOTE — Assessment & Plan Note (Addendum)
Pt smokes 1/2 pack per day. - Nicotine patches

## 2023-08-24 NOTE — Progress Notes (Addendum)
FMTS Attending Daily Note: Burley Saver, MD  Team Pager [phone] Pager [phone] I have personally seen and examined this patient, reviewed their chart and results. I have discussed this patient with the resident. I agree with the resident's findings, assessment and care plan as documented in the resident's note, clarifications or changes to the resident's note are listed below.  See H&P cosign for thoughts from today. Addendums in note below.       Daily Progress Note Intern Pager: [phone]  Patient name: Monica Ellis Medical record number: [id number] Date of birth: 02/22/1961 Age: 62 y.o. Gender: female  Primary Care Provider: Annita Brod, MD Consultants: Erline Hau Code Status: DNR, Interventions: may intubate, use advanced airway interventions and cardioversion/ACLS medications if appropriate or indicated. May transfer to ICU.  Pt Overview and Major Events to Date:  Monica Ellis is a 62 yo woman with SLE, HTN, Hx CVA, DM2, and Chronic pain who presented 9/17 for acute on chronic dyspnea. She was lying in bed and woke from sleep. Had to increase O2 to 5L from 3L and has not been able to wean herself. She presented to the [facility] ED.  Assessment and Plan: Assessment & Plan Dyspnea Pt has acute on chronic shortness of breath that began this morning. Reports that she was de-satting into the high 70% on her home monitor. Could not get back to baseline with her normal 3L. CTA negative for PE but does show prominence of central pulmonary artery. Previous notes indicate a history of "vanishing lung syndrome", however the differential for her presentation remains wide.  - Pulmonology consult ordered 9/18, appreciate their recs. - Echocardiogram ordered to assess for pulmonary HTN - H/o OHS- consider starting trial of BIPAP/CPAP - AM CMP, CBC, Mg, K - PT/OT  Diabetes mellitus, type 2 (HCC) Pt has history of T2DM associated with her chronic use of prednisone for lupus. Pt is on  75/25 insulin mix at home. The history as provided by the patient is different than the medication reconciliation from her last outpatient appointment. Pt has detailed knowledge of how she responds to insulin. - HgbA1c 6.1 on 9/18 - Today (9/18) - s/p 75 units short acting today and 20 units of semglee- have removed sliding scale and adjusted more similar to home insulin to avoid overtreating with short acting. 70/30 insulin 30 units with lunch and dinner and reassess tomorrow and adjust accordingly - 4x daily CBG SLE (systemic lupus erythematosus related syndrome) (HCC) Long standing SLE. Higher risk in immunocompromised state. Nicotine dependence, cigarettes, uncomplicated Pt smokes 1/2 pack per day. - Nicotine patches  Chronic liver disease Pt has history of chronic liver disease and  autoimmune hepatitis. On Myfortic and prednisone, nadolol, and lactulose, continue home meds   Chronic and Stable Problems:  - Chronic Pain - Oxycontin 12 hr tabs 10 mg Q6 PRN - HTN - Amlodipine 10 mg daily, hydrochlorothiazide 12.5 daily - Hx CVA - (September 2023) - asprin 81 / plavix 75 daily - Consider discontinuing DAPT and going to single therapy. - Depression, anxiety, unspecified - bupropion 150mg  daily, buspar 10 mg BID   FEN/GI: lactulose VTE Prophylaxis: Lovenox     Subjective:  Pt this morning was awake and alert during exam. Met patient with PharmD Georgina Snell. Pt was sitting upright during exam. Said she had a difficult night of being interrupted, but was glad to be here and being seen for her health. She reported no improvement in symptoms from yesterday.   Objective: Temp:  [97.6 F (36.4  C)-98.6 F (37 C)] 98 F (36.7 C) (09/18 0433) Pulse Rate:  [53-89] 70 (09/18 0433) Resp:  [15-20] 18 (09/18 0433) BP: (124-157)/(75-92) 131/81 (09/18 0433) SpO2:  [95 %-99 %] 99 % (09/18 0433)  Physical Exam: General: Pt is obese woman, AxO4. Pt is cooperative with exam.  Cardiovascular:  Heart sounds distant, difficult to auscultate. No murmurs or rubs appreciated. Respiratory: Pt failed in-room wean of 5>3L. Lung sounds CTAB, no wheezes or rhonchus but lung sounds difficult to hear in lower lobes.  Abdomen: NT/ND, but difficult to assess due to patient habitus. Extremities: Leg swelling has returned to patient's baseline. No cyanosis or clubbing.   Laboratory: Most recent CBC Lab Results  Component Value Date   WBC 10.8 (H) 08/23/2023   HGB 13.3 08/23/2023   HCT 39.0 08/23/2023   MCV 96.1 08/23/2023   PLT 185 08/23/2023   Most recent BMP    Latest Ref Rng & Units 08/23/2023    6:49 AM  BMP  Sodium 135 - 145 mmol/L 141   Potassium 3.5 - 5.1 mmol/L 3.3     Other pertinent labs:    Latest Reference Range & Units 08/23/23 20:32 08/24/23 07:27 08/24/23 10:21 08/24/23 11:37  Glucose-Capillary 70 - 99 mg/dL 409 (H) 811 (H) 914 (H) 306 (H)  (H): Data is abnormally high  Imaging/Diagnostic Tests: CTA Chest - 9/18 Radiologist: IMPRESSION: No evidence of pulmonary emboli. Mild prominence of the central pulmonary artery is again noted. Stable atelectatic changes bilaterally. Cirrhotic change of the liver. Aortic Atherosclerosis (ICD10-I70.0).   Margaretmary Dys, MD 08/24/2023, 8:14 AM  PGY-1, [facility] Family Medicine FPTS Intern pager: [phone], text pages welcome Secure chat group [facility] Teaching Service

## 2023-08-24 NOTE — Progress Notes (Signed)
There was a miscommunication with the patient about her insulin regimen. Upon clarification with pharmacy, patient takes 75/25 U 120 TID with meals, not lispro alone. We will discontinue short acting and Semglee and move forward with 70/30 to mimic her home regimen. Continue with SSI + 70/30 50 U with meals for now and increase likely tomorrow as she has already gotten short and long acting today. Will check CBG closely as well. Appreciate pharmacy assistance.   Kamalei Roeder Geophysical data processor

## 2023-08-24 NOTE — TOC CM/SW Note (Signed)
Transition of Care [facility]) - Inpatient Brief Assessment   Patient Details  Name: Monica Ellis MRN: [id number] Date of Birth: Jan 18, 1961  Transition of Care [facility]) CM/SW Contact:    Tom-Johnson, Hershal Coria, RN Phone Number: 08/24/2023, 4:28 PM   Clinical Narrative:  Patient presented to the ED with worsening SOB, on 3L O2 at Home, uses 5L O2 inpatient. On Steroid and  Neb tx.  Has hx of Lupus, CVA, Chronic Respiratory Failure with Hypoxia, Autoimmune Hepatitis, on Myfortic and Prednisone daily.    From home alone, has two supportive daughters. Currently on diability. Has a hosp bed, walker and shower bench at home.  PCP is Annita Brod, MD and uses [facility] on [address].   Home Health PT/OT recommended, patient states she was with [facility] a year ago and request to use their services again. CM sent referral to [facility] and Haywood Lasso noted acceptance, info on AVS.   Patient not Medically ready for discharge.  CM will continue to follow as patient progresses with care towards discharge.       Transition of Care Asessment: Insurance and Status: Insurance coverage has been reviewed Patient has primary care physician: Yes Home environment has been reviewed: Yes Prior level of function:: Modified Independent Prior/Current Home Services: No current home services Social Determinants of Health Reivew: SDOH reviewed no interventions necessary Readmission risk has been reviewed: Yes Transition of care needs: transition of care needs identified, TOC will continue to follow

## 2023-08-24 NOTE — Assessment & Plan Note (Signed)
Pt has history of chronic liver disease. Pt does not drink alcohol.  - lactulose 10 g daily

## 2023-08-24 NOTE — Assessment & Plan Note (Addendum)
Long standing SLE. Higher risk in immunocompromised state.

## 2023-08-24 NOTE — Progress Notes (Signed)
  Echocardiogram 2D Echocardiogram has been performed.  Monica Ellis 08/24/2023, 4:45 PM

## 2023-08-24 NOTE — Assessment & Plan Note (Addendum)
Pt has acute on chronic shortness of breath that began this morning. Reports that she was de-satting into the high 70% on her home monitor. Could not get back to baseline with her normal 3L. CTA negative for PE but does show prominence of central pulmonary artery. Previous notes indicate a history of "vanishing lung syndrome", however the differential for her presentation remains wide.  - Pulmonology consult ordered 9/18, appreciate their recs. - Echocardiogram ordered to assess for pulmonary HTN - H/o OHS- consider starting trial of BIPAP/CPAP - AM CMP, CBC, Mg, K - PT/OT

## 2023-08-24 NOTE — Inpatient Diabetes Management (Signed)
Inpatient Diabetes Program Recommendations  AACE/ADA: New Consensus Statement on Inpatient Glycemic Control (2015)  Target Ranges:  Prepandial:   less than 140 mg/dL      Peak postprandial:   less than 180 mg/dL (1-2 hours)      Critically ill patients:  140 - 180 mg/dL   Lab Results  Component Value Date   GLUCAP 327 (H) 08/24/2023   HGBA1C 6.1 (H) 08/23/2023    Review of Glycemic Control  Latest Reference Range & Units 08/23/23 18:35 08/23/23 20:32 08/24/23 07:27  Glucose-Capillary 70 - 99 mg/dL 756 (H) 433 (H) 295 (H)   Diabetes history: DM 2 Outpatient Diabetes medications:  Humalog 75/25 120 units in the AM, 60 units with lunch, and 60 units with dinner Current orders for Inpatient glycemic control:  Novolog 70/30 50 units tid with meals  Novolog 0-20 units tid with meals  Inpatient Diabetes Program Recommendations:    Note patient received 75 units of Novolog this AM for CBG>400 mg/dL.  Now ordered Novolog 70/30 50 units tid plus correction. Will follow.   Thanks,  Beryl Meager, RN, BC-ADM Inpatient Diabetes Coordinator Pager [phone] (8a-5p)

## 2023-08-24 NOTE — Evaluation (Signed)
Physical Therapy Evaluation Patient Details Name: Monica Ellis MRN: [id number] DOB: May 31, 1961 Today's Date: 08/24/2023  History of Present Illness  Pt is a 62 y/o F admitted on 08/23/23 with c/o SOB. PMH:  lupus, chronic respiratory failure, obesity hypoventilation syndrome, chronic diastolic CHF, DM2  Clinical Impression  Pt seen for PT evaluation with co-tx with OT. Pt agreeable to limited mobility 2/2 c/o SOB, pt now on 6L/min via nasal cannula. Pt is able to complete bed mobility with mod I, bed<>recliner without AD with mod I but holding to bed rails/chair armrests for UE support 2/2 decreased balance & strength. Will continue to follow pt acutely to address endurance/activity tolerance, strength, balance & gait.  Educated pt on importance of sitting in chair vs lying in bed & pt agreeable to attempt tomorrow or when feeling better.     If plan is discharge home, recommend the following: A little help with walking and/or transfers;A little help with bathing/dressing/bathroom;Assistance with cooking/housework;Assist for transportation;Help with stairs or ramp for entrance   Can travel by private vehicle        Equipment Recommendations None recommended by PT  Recommendations for Other Services       Functional Status Assessment Patient has had a recent decline in their functional status and demonstrates the ability to make significant improvements in function in a reasonable and predictable amount of time.     Precautions / Restrictions Precautions Precautions: Fall Restrictions Weight Bearing Restrictions: No      Mobility  Bed Mobility Overal bed mobility: Modified Independent             General bed mobility comments: HOB elevated    Transfers   Equipment used: None Transfers: Sit to/from Stand, Bed to chair/wheelchair/BSC Sit to Stand: Modified independent (Device/Increase time)   Step pivot transfers: Modified independent (Device/Increase time) (pt  holding to bed rail/chair arm rest PRN)            Ambulation/Gait                  Stairs            Wheelchair Mobility     Tilt Bed    Modified Rankin (Stroke Patients Only)       Balance Overall balance assessment: Needs assistance   Sitting balance-Leahy Scale: Good     Standing balance support: During functional activity, No upper extremity supported Standing balance-Leahy Scale: Poor                               Pertinent Vitals/Pain Pain Assessment Pain Assessment: Faces Faces Pain Scale: Hurts even more Pain Location: generalized Pain Descriptors / Indicators: Sore, Aching Pain Intervention(s): Monitored during session, Limited activity within patient's tolerance    Home Living Family/patient expects to be discharged to:: Private residence Living Arrangements: Alone Available Help at Discharge: Family;Available PRN/intermittently Type of Home: House Home Access: Stairs to enter Entrance Stairs-Rails: Left;Right (wide set) Entrance Stairs-Number of Steps: 7 front steps. 2 back entrance Alternate Level Stairs-Number of Steps: washer and dryer upstairs Home Layout: Two level Home Equipment: Art gallery manager;Tub bench;Hand held shower head;Rollator (4 wheels) Additional Comments: PCA 2hrs/day Monday-Thursday. Denies falls in the last year. 3L O2 at baseline    Prior Function Prior Level of Function : Independent/Modified Independent             Mobility Comments: Pt reports she ambulates around house to bathroom, makes an effort  to continue walking but does have an electric scooter she uses quite a bit as well. When pt ambulates to bathroom she holds to furniture. Pt denies falls in the past 6 months. ADLs Comments: ind, PCA helps with househould iADLs     Extremity/Trunk Assessment   Upper Extremity Assessment Upper Extremity Assessment: Overall WFL for tasks assessed    Lower Extremity Assessment Lower Extremity  Assessment: Generalized weakness       Communication   Communication Communication: No apparent difficulties  Cognition Arousal: Alert Behavior During Therapy: WFL for tasks assessed/performed Overall Cognitive Status: Within Functional Limits for tasks assessed                                 General Comments: very pleasant & aware of situation        General Comments General comments (skin integrity, edema, etc.): Pt on 6L/min supplemental O2 via nasal cannula with SpO2 95% after transfer bed>recliner.    Exercises     Assessment/Plan    PT Assessment Patient needs continued PT services  PT Problem List Decreased strength;Cardiopulmonary status limiting activity;Decreased activity tolerance;Decreased balance;Decreased mobility       PT Treatment Interventions Balance training;Gait training;DME instruction;Neuromuscular re-education;Stair training;Functional mobility training;Patient/family education;Therapeutic activities;Manual techniques;Therapeutic exercise    PT Goals (Current goals can be found in the Care Plan section)  Acute Rehab PT Goals Patient Stated Goal: breathe better PT Goal Formulation: With patient Time For Goal Achievement: 09/07/23 Potential to Achieve Goals: Good    Frequency Min 1X/week     Co-evaluation PT/OT/SLP Co-Evaluation/Treatment: Yes Reason for Co-Treatment: Other (comment) (pt likely not able to tolerate 2 seperate sessions 2/2 SOB) PT goals addressed during session: Mobility/safety with mobility         AM-PAC PT "6 Clicks" Mobility  Outcome Measure Help needed turning from your back to your side while in a flat bed without using bedrails?: A Little Help needed moving from lying on your back to sitting on the side of a flat bed without using bedrails?: A Little Help needed moving to and from a bed to a chair (including a wheelchair)?: None Help needed standing up from a chair using your arms (e.g., wheelchair or  bedside chair)?: None Help needed to walk in hospital room?: A Little Help needed climbing 3-5 steps with a railing? : A Lot 6 Click Score: 19    End of Session Equipment Utilized During Treatment: Oxygen Activity Tolerance: Treatment limited secondary to medical complications (Comment) (limited 2/2 SOB) Patient left: in bed;with call bell/phone within reach   PT Visit Diagnosis: Muscle weakness (generalized) (M62.81);Difficulty in walking, not elsewhere classified (R26.2);Other abnormalities of gait and mobility (R26.89);Unsteadiness on feet (R26.81)    Time: 0865-7846 PT Time Calculation (min) (ACUTE ONLY): 26 min   Charges:   PT Evaluation $PT Eval Low Complexity: 1 Low   PT General Charges $$ ACUTE PT VISIT: 1 Visit         Aleda Grana, PT, DPT 08/24/23, 12:00 PM   Sandi Mariscal 08/24/2023, 11:59 AM

## 2023-08-24 NOTE — Progress Notes (Signed)
Pt is placed on overnight pulse ox.

## 2023-08-24 NOTE — Assessment & Plan Note (Addendum)
Pt has history of chronic liver disease and  autoimmune hepatitis. On Myfortic and prednisone, nadolol, and lactulose, continue home meds

## 2023-08-24 NOTE — Progress Notes (Addendum)
Patient concerned about blood sugar of (306). Family medicine team notified to address Insulin dosages due to not having Insulin coverage for noon. RN waiting for response.

## 2023-08-24 NOTE — Evaluation (Signed)
Occupational Therapy Evaluation Patient Details Name: Monica Ellis MRN: [id number] DOB: 1961/02/17 Today's Date: 08/24/2023   History of Present Illness Pt is a 62 y/o F admitted on 08/23/23 with c/o SOB. PMH:  lupus, chronic respiratory failure, obesity hypoventilation syndrome, chronic diastolic CHF, DM2   Clinical Impression   Pt admitted for above, presents with decreased activity tolerance due to increased SOB with mobility and generalized pain. Pt reports nothing is helping resolve SOB not even breathing exercises. Pt not able to fully perform ADLs at her baseline due to reduced activity tolerance. Pt would benefit from continued acute skilled OT services to educate on energy conservation strategies prn. Patient would benefit from post acute Home OT services to help maximize functional independence in natural environment       If plan is discharge home, recommend the following: Assistance with cooking/housework;A little help with bathing/dressing/bathroom    Functional Status Assessment  Patient has had a recent decline in their functional status and demonstrates the ability to make significant improvements in function in a reasonable and predictable amount of time.  Equipment Recommendations       Recommendations for Other Services       Precautions / Restrictions Precautions Precautions: Fall Restrictions Weight Bearing Restrictions: No      Mobility Bed Mobility Overal bed mobility: Modified Independent             General bed mobility comments: HOB elevated    Transfers   Equipment used: None Transfers: Sit to/from Stand, Bed to chair/wheelchair/BSC Sit to Stand: Modified independent (Device/Increase time)     Step pivot transfers: Modified independent (Device/Increase time)     General transfer comment: pt holding to bed rail/chair arm rest PRN      Balance Overall balance assessment: Needs assistance   Sitting balance-Leahy Scale: Good      Standing balance support: During functional activity, No upper extremity supported Standing balance-Leahy Scale: Fair                             ADL either performed or assessed with clinical judgement   ADL Overall ADL's : Needs assistance/impaired Eating/Feeding: Independent;Sitting   Grooming: Sitting;Set up   Upper Body Bathing: Modified independent;Sitting   Lower Body Bathing: Sitting/lateral leans;Minimal assistance   Upper Body Dressing : Sitting;Set up   Lower Body Dressing: Sitting/lateral leans;Maximal assistance Lower Body Dressing Details (indicate cue type and reason): don socks Toilet Transfer: Stand-pivot;Modified Independent Toilet Transfer Details (indicate cue type and reason): simulated with recliner Toileting- Clothing Manipulation and Hygiene: Sit to/from stand;Contact guard assist         General ADL Comments: Pt noted to have an increase in SOB with mobility     Vision         Perception         Praxis         Pertinent Vitals/Pain Pain Assessment Pain Assessment: Faces Faces Pain Scale: Hurts even more Pain Location: generalized Pain Descriptors / Indicators: Sore, Aching Pain Intervention(s): Limited activity within patient's tolerance, Monitored during session, Patient requesting pain meds-RN notified     Extremity/Trunk Assessment Upper Extremity Assessment Upper Extremity Assessment: Generalized weakness   Lower Extremity Assessment Lower Extremity Assessment: Generalized weakness       Communication Communication Communication: No apparent difficulties   Cognition Arousal: Alert Behavior During Therapy: WFL for tasks assessed/performed Overall Cognitive Status: Within Functional Limits for tasks assessed  General Comments: very pleasant & aware of situation     General Comments  Sp02 97% Sp02 at rest and 95% after transfer, continues to have SOB. PE ruled  out    Exercises     Shoulder Instructions      Home Living Family/patient expects to be discharged to:: Private residence Living Arrangements: Alone Available Help at Discharge: Family;Available PRN/intermittently Type of Home: House Home Access: Stairs to enter [employer] of Steps: 7 front steps. 2 back entrance Entrance Stairs-Rails: Left;Right (wide set) Home Layout: Two level Alternate Level Stairs-Number of Steps: washer and dryer upstairs   Bathroom Shower/Tub: Chief Strategy Officer:  (comfort height) Bathroom Accessibility: Yes How Accessible: Accessible via walker Home Equipment: Art gallery manager;Tub bench;Hand held shower head;Rollator (4 wheels)   Additional Comments: PCA 2hrs/day Monday-Thursday. Denies falls in the last year. 3L O2 at baseline      Prior Functioning/Environment Prior Level of Function : Independent/Modified Independent             Mobility Comments: Pt reports she ambulates around house to bathroom, makes an effort to continue walking but does have an electric scooter she uses quite a bit as well. When pt ambulates to bathroom she holds to furniture. Pt denies falls in the past 6 months. ADLs Comments: ind, PCA helps with househould iADLs        OT Problem List: Decreased activity tolerance;Cardiopulmonary status limiting activity      OT Treatment/Interventions: Self-care/ADL training;Balance training;Therapeutic exercise;Therapeutic activities;Patient/family education    OT Goals(Current goals can be found in the care plan section) Acute Rehab OT Goals Patient Stated Goal: To feel better OT Goal Formulation: With patient Time For Goal Achievement: 09/07/23 Potential to Achieve Goals: Good ADL Goals Pt Will Perform Lower Body Bathing: with modified independence;sitting/lateral leans;with adaptive equipment Pt Will Perform Lower Body Dressing: with modified independence;sitting/lateral leans;with adaptive  equipment Pt Will Transfer to Toilet: with supervision;ambulating Additional ADL Goal #1: Pt will demonstrate independent use of energy conservation stratgies to complete iADLs/ADLs Additional ADL Goal #2: Pt will tolerate 3 mins of OOB mobility without SOB in preparation for standing ADL activity  OT Frequency: Min 1X/week    Co-evaluation PT/OT/SLP Co-Evaluation/Treatment: Yes Reason for Co-Treatment: Other (comment) (pt likely not able to tolerate 2 seperate sessions 2/2 SOB) PT goals addressed during session: Mobility/safety with mobility OT goals addressed during session: ADL's and self-care      AM-PAC OT "6 Clicks" Daily Activity     Outcome Measure Help from another person eating meals?: None Help from another person taking care of personal grooming?: A Little Help from another person toileting, which includes using toliet, bedpan, or urinal?: A Little Help from another person bathing (including washing, rinsing, drying)?: A Little Help from another person to put on and taking off regular upper body clothing?: A Little Help from another person to put on and taking off regular lower body clothing?: A Lot 6 Click Score: 18   End of Session Equipment Utilized During Treatment: Oxygen Nurse Communication: Mobility status  Activity Tolerance: Treatment limited secondary to medical complications (Comment) (increased SOB with mobility) Patient left: in bed;with call bell/phone within reach  OT Visit Diagnosis: Other (comment) (SOB)                Time: 5784-6962 OT Time Calculation (min): 30 min Charges:  OT General Charges $OT Visit: 1 Visit OT Evaluation $OT Eval Low Complexity: 1 Low  08/24/2023  AB, OTR/L  Acute Rehabilitation  Services  Office: [phone]   Tristan Schroeder 08/24/2023, 1:53 PM

## 2023-08-24 NOTE — Consult Note (Signed)
NAME:  Monica Ellis, MRN:  [id number], DOB:  02/10/1961, LOS: 1 ADMISSION DATE:  08/23/2023, CONSULTATION DATE:  08/24/2023 REFERRING MD:  Billey Co, MD, CHIEF COMPLAINT:  hypoxemia   History of Present Illness:  The patient is a 62 year old woman with a complicated past medical history.  Notably she was diagnosed with lupus, autoimmune hepatitis, and is currently on prednisone and Myfortic.  She was seen in pulmonary clinic at [facility] and PFTs showed restriction to ventilation with reduced diffusion capacity.  Her CT scan at that time was without fibrotic lung disease, and there was concern for possible portal pulmonary hypertension.  However she had an echocardiogram that seem to rule out hepatopulmonary syndrome.  Her pulmonologist there felt that her symptoms are most likely secondary to obesity hypoventilation syndrome and she was started on a trilogy vent.  However she was not able to tolerate this for very long and there was some trouble affording the device.  She was recommended undergo pulmonary rehab and weight loss with nocturnal vent therapy.  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  Furthermore she was already on Myfortic and prednisone which would treat shrinking lung syndrome.  She is referred to pulmonary rehab but says she did rehab at home instead.  She says that 6 months ago she actually weaned herself off of all her oxygen therapy.  However the course of the last several weeks she has had worsening shortness of breath and came to the ED because she felt she needed more than her baseline 3 L nasal cannula.  She says she has pain in all her joints all the time secondary to her lupus although she has been on prednisone for many many years.  She denies lower extremity edema, abdominal swelling, orthopnea.  She thinks she actually sleeps quite well.    Pertinent  Medical History   SLE Hypertension Diabetes Autoimmune hepatitis with resultant cirrhosis Chronic respiratory failure  Significant Hospital Events: Including procedures, antibiotic start and stop dates in addition to other pertinent events     Interim History / Subjective:    Objective   Blood pressure (!) 150/81, pulse 69, temperature 98.4 F (36.9 C), temperature source Oral, resp. rate 18, SpO2 94%.        Intake/Output Summary (Last 24 hours) at 08/24/2023 1526 Last data filed at 08/24/2023 0804 Gross per 24 hour  Intake 240 ml  Output --  Net 240 ml   There were no vitals filed for this visit.  Examination: General: obese, sitting up HENT: on nasal cannula Lungs: diminished, mildly tachypnic, no wheezes or crackles Cardiovascular: RRR, diminished, no mrg Abdomen: extensive pannus, abdomen soft Extremities: non pitting LE edema Neuro: normal speech, moves all 4 extremities, AOx4  Labs and imaging reviewed BNP 67 Creatinine 1.05 Na 133 K 4.1 Trop 10  Lower extremity dopplers negative Echo pending  Records reviewed from [facility] pulmonary [facility], transplant CTPE study personally reviewed - negative for PE, low lung volumes with bibasilar atelectasis, mild mosaic attenuation as seen in small airways disease such as COPD and also in obesity,   PFTS 2021 reviewed - severe restriction to ventilation based on nitrogen washout study, moderately reduced dlco Resolved Hospital Problem list     Assessment & Plan:  Acute on chronic hypoxemic respiratory failure Suspicion for chronic hypercapnia Suspected untreated OSA with OHS COPD with ongoing tobacco use disorder SLE  Autoimmune hepatitis with compensated cirrhosis - currently  on myfortic and prednisone Physical deconditioning  Differential diagnosis is broad - most likely this is untreated OHS/OSA given clinical history. She has tried trilogy vent before and did not like wearing the mask.  This does not appear to be a COPD  exacerbation given no wheezing, chest tightness.  Will await results of echocardiogram to evaluate for HFpEf.  May consider repeating for agitated saline test for intracardiac shunting.  Given her cirrhosis would also consider evaluation for hepatopulmonary syndrome.  These are both less likely on my list. Will send inflammatory markers and SLE Ab to evaluate for disease activity.  If this was shrinking lung syndrome, which is a diagnosis of exclusion, it would respond to steroids and immune suppression - unfortunately the immune suppression most study outside of rituxumab is imuran which she cannot tolerate due to nausea and vomiting.  I would not take starting her on steroids lightly given her chronic prednisone use and her multiple comorbidities including diabetes.  PCCM will follow.   I spent 80 minutes in total visit time for this patient, with more than 50% spent counseling/coordinating care.  Labs   CBC: Recent Labs  Lab 08/23/23 0618 08/23/23 0649 08/24/23 0834  WBC 10.8*  --  12.3*  NEUTROABS 6.7  --   --   HGB 12.0 13.3 12.0  HCT 39.7 39.0 39.2  MCV 96.1  --  94.9  PLT 185  --  184    Basic Metabolic Panel: Recent Labs  Lab 08/23/23 0618 08/23/23 0649 08/24/23 0834  NA 138 141 133*  K 3.2* 3.3* 4.1  CL 103  --  98  CO2 29  --  27  GLUCOSE 133*  --  419*  BUN 14  --  18  CREATININE 0.88  --  1.05*  CALCIUM 8.6*  --  8.7*   GFR: Estimated Creatinine Clearance: 79 mL/min (A) (by C-G formula based on SCr of 1.05 mg/dL (H)). Recent Labs  Lab 08/23/23 0618 08/24/23 0834  WBC 10.8* 12.3*    Liver Function Tests: Recent Labs  Lab 08/23/23 0618 08/24/23 0834  AST 26 19  ALT 28 27  ALKPHOS 32* 31*  BILITOT 0.8 1.0  PROT 7.8 8.0  ALBUMIN 3.1* 3.2*   No results for input(s): "LIPASE", "AMYLASE" in the last 168 hours. No results for input(s): "AMMONIA" in the last 168 hours.  ABG    Component Value Date/Time   PHART 7.44 09/04/2022 1213   PCO2ART 46  09/04/2022 1213   PO2ART 41 (L) 09/04/2022 1213   HCO3 31.9 (H) 08/23/2023 0649   TCO2 34 (H) 08/23/2023 0649   O2SAT 99 08/23/2023 0649     Coagulation Profile: No results for input(s): "INR", "PROTIME" in the last 168 hours.  Cardiac Enzymes: No results for input(s): "CKTOTAL", "CKMB", "CKMBINDEX", "TROPONINI" in the last 168 hours.  HbA1C: Hgb A1c MFr Bld  Date/Time Value Ref Range Status  08/23/2023 06:18 AM 6.1 (H) 4.8 - 5.6 % Final    Comment:    (NOTE) Pre diabetes:          5.7%-6.4%  Diabetes:              >6.4%  Glycemic control for   <7.0% adults with diabetes   09/03/2022 12:23 PM 5.6 4.8 - 5.6 % Final    Comment:    (NOTE) Pre diabetes:          5.7%-6.4%  Diabetes:              >  6.4%  Glycemic control for   <7.0% adults with diabetes     CBG: Recent Labs  Lab 08/24/23 0727 08/24/23 1021 08/24/23 1137 08/24/23 1331 08/24/23 1428  GLUCAP 327* 402* 306* 292* 263*    Review of Systems:   As in HPI  Past Medical History:  She,  has a past medical history of Autoimmune hepatitis (HCC), Brain tumor (HCC), Diabetes mellitus without complication (HCC), GERD (gastroesophageal reflux disease), Hypertension, Lupus (HCC), Morbid obesity (HCC), Obesity hypoventilation syndrome (HCC), Pneumonia, and Tobacco use disorder.   Surgical History:   Past Surgical History:  Procedure Laterality Date   CHOLECYSTECTOMY     DILATION AND CURETTAGE OF UTERUS     EYE SURGERY Bilateral    laser surgery    IR TRANSCATHETER BX  11/03/2018   IR US GUIDE VASC ACCESS RIGHT  11/03/2018   IR VENOGRAM HEPATIC W HEMODYNAMIC EVALUATION  11/03/2018   RIGHT HEART CATH N/A 09/01/2020   Procedure: RIGHT HEART CATH;  Surgeon: Dolores Patty, MD;  Location: [facility];  Service: Cardiovascular;  Laterality: N/A;     Social History:   reports that she has been smoking cigarettes. She has a 30 pack-year smoking history. She has never used smokeless tobacco. She  reports that she does not drink alcohol and does not use drugs.   Family History:  Her family history includes Cancer in her maternal uncle; Diabetes in her father; Hypertension in her father and mother; Kidney cancer in her maternal uncle.   Allergies Allergies  Allergen Reactions   Ace Inhibitors Swelling   Imuran [Azathioprine] Nausea And Vomiting    Severe N/V/D and AKI after starting the med.   Lisinopril Swelling and Other (See Comments)    Other reaction(s): Angioedema (ALLERGY/intolerance), Face   Protonix [Pantoprazole] Swelling    Pt states that it causes her lips to swell.    Tramadol Other (See Comments)    Other reaction(s): Mental Status Changes (intolerance) suicidal thoughts   Iodinated Contrast Media Swelling    When patient was in her 20's, swelled up all over after getting getting injection of contrast; did not have any other symptoms/ was given benadryl after that happened and had no further problems per patients/   Iodine Swelling   Omeprazole Swelling    Lips will swell     Home Medications  Prior to Admission medications   Medication Sig Start Date End Date Taking? Authorizing Provider  amLODipine (NORVASC) 10 MG tablet Take 10 mg by mouth daily.   Yes [provider]  ascorbic acid (VITAMIN C) 500 MG tablet Take 2,000 mg by mouth daily.   Yes [provider]  aspirin 81 MG chewable tablet Chew 1 tablet (81 mg total) by mouth daily. 09/06/22  Yes Nooruddin, Jason Fila, MD  buPROPion (WELLBUTRIN XL) 150 MG 24 hr tablet Take 1 tablet by mouth daily.   Yes [provider]  busPIRone (BUSPAR) 10 MG tablet Take 10 mg by mouth 2 (two) times daily. 04/05/22  Yes [provider]  Cholecalciferol 10 MCG (400 UNIT) CAPS Take 800 Units by mouth daily.   Yes [provider]  cloNIDine (CATAPRES) 0.1 MG tablet Take 0.1 mg by mouth as needed (high blood pressure).   Yes [provider]  clopidogrel (PLAVIX) 75 MG tablet Take 1  tablet (75 mg total) by mouth daily. 09/06/22  Yes Nooruddin, Jason Fila, MD  ENULOSE 10 GM/15ML SOLN Take by mouth. 04/20/22  Yes [provider]  hydrochlorothiazide (  HYDRODIURIL) 12.5 MG tablet Take 1 tablet (12.5 mg total) by mouth daily. 09/05/22  Yes Nooruddin, Jason Fila, MD  insulin lispro protamine-lispro (HUMALOG 75/25 MIX) (75-25) 100 UNIT/ML SUSP injection Inject 120 Units into the skin 3 (three) times daily with meals. Patient taking differently: Inject 60-120 Units into the skin 3 (three) times daily with meals. 06/17/20  Yes Zannie Cove, MD  lactulose (CHRONULAC) 10 GM/15ML solution Take 30 mLs by mouth daily as needed for constipation. 11/22/19  Yes [provider]  magnesium 30 MG tablet Take 30 mg by mouth at bedtime.    Yes [provider]  Multiple Vitamin (MULTIVITAMIN) tablet Take 1 tablet by mouth daily.    Yes [provider]  mycophenolate (MYFORTIC) 360 MG TBEC EC tablet Take 1,080 mg by mouth 2 (two) times daily. 05/13/20  Yes [provider]  nadolol (CORGARD) 40 MG tablet Take 1 tablet by mouth 2 (two) times daily.   Yes [provider]  Omega-3 Fatty Acids (FISH OIL PO) Take 1 capsule by mouth every evening.    Yes [provider]  ondansetron (ZOFRAN-ODT) 4 MG disintegrating tablet Take 1 tablet (4 mg total) by mouth every 8 (eight) hours as needed for nausea or vomiting. 08/15/22  Yes Sabas Sous, MD  oxyCODONE (OXYCONTIN) 10 mg 12 hr tablet Take 10 mg by mouth every 6 (six) hours as needed for pain.   Yes [provider]  potassium chloride SA (KLOR-CON) 20 MEQ tablet Take 2 tablets (40 mEq total) by mouth daily. 05/31/21 08/23/23 Yes Lynn Ito, MD  predniSONE (DELTASONE) 5 MG tablet Take 10 mg by mouth daily with breakfast.   Yes [provider]  promethazine (PHENERGAN) 25 MG tablet Take 12.5-25 mg by mouth every 6 (six) hours as needed. 08/11/22  Yes [provider]  TURMERIC PO Take 400  mg by mouth daily.    Yes [provider]  zolpidem (AMBIEN) 10 MG tablet Take 10 mg by mouth at bedtime. 05/15/19  Yes [provider]  ONETOUCH ULTRA test strip USE TO TEST SUGARS 5 TIMES DAILY 07/09/22   [provider]

## 2023-08-25 DIAGNOSIS — J9621 Acute and chronic respiratory failure with hypoxia: Secondary | ICD-10-CM

## 2023-08-25 DIAGNOSIS — K769 Liver disease, unspecified: Secondary | ICD-10-CM

## 2023-08-25 DIAGNOSIS — Z7952 Long term (current) use of systemic steroids: Secondary | ICD-10-CM | POA: Diagnosis present

## 2023-08-25 DIAGNOSIS — R0602 Shortness of breath: Secondary | ICD-10-CM | POA: Diagnosis not present

## 2023-08-25 DIAGNOSIS — M329 Systemic lupus erythematosus, unspecified: Secondary | ICD-10-CM | POA: Diagnosis not present

## 2023-08-25 DIAGNOSIS — D84821 Immunodeficiency due to drugs: Secondary | ICD-10-CM | POA: Diagnosis present

## 2023-08-25 DIAGNOSIS — E1165 Type 2 diabetes mellitus with hyperglycemia: Secondary | ICD-10-CM | POA: Diagnosis not present

## 2023-08-25 DIAGNOSIS — F17210 Nicotine dependence, cigarettes, uncomplicated: Secondary | ICD-10-CM | POA: Diagnosis not present

## 2023-08-25 DIAGNOSIS — I5189 Other ill-defined heart diseases: Secondary | ICD-10-CM

## 2023-08-25 DIAGNOSIS — E119 Type 2 diabetes mellitus without complications: Secondary | ICD-10-CM

## 2023-08-25 DIAGNOSIS — Z794 Long term (current) use of insulin: Secondary | ICD-10-CM

## 2023-08-25 DIAGNOSIS — I272 Pulmonary hypertension, unspecified: Secondary | ICD-10-CM | POA: Diagnosis not present

## 2023-08-25 LAB — COMPREHENSIVE METABOLIC PANEL
ALT: 26 U/L (ref 0–44)
AST: 18 U/L (ref 15–41)
Albumin: 3.4 g/dL — ABNORMAL LOW (ref 3.5–5.0)
Alkaline Phosphatase: 28 U/L — ABNORMAL LOW (ref 38–126)
Anion gap: 8 (ref 5–15)
BUN: 23 mg/dL (ref 8–23)
CO2: 31 mmol/L (ref 22–32)
Calcium: 8.8 mg/dL — ABNORMAL LOW (ref 8.9–10.3)
Chloride: 95 mmol/L — ABNORMAL LOW (ref 98–111)
Creatinine, Ser: 0.92 mg/dL (ref 0.44–1.00)
GFR, Estimated: >60 mL/min (ref >60)
Glucose, Bld: 93 mg/dL (ref 70–99)
Potassium: 3.5 mmol/L (ref 3.5–5.1)
Sodium: 134 mmol/L — ABNORMAL LOW (ref 135–145)
Total Bilirubin: 1.1 mg/dL (ref 0.3–1.2)
Total Protein: 7.8 g/dL (ref 6.5–8.1)

## 2023-08-25 LAB — CBC
HCT: 38.4 % (ref 36.0–46.0)
Hemoglobin: 12.2 g/dL (ref 12.0–15.0)
MCH: 29.6 pg (ref 26.0–34.0)
MCHC: 31.8 g/dL (ref 30.0–36.0)
MCV: 93.2 fL (ref 80.0–100.0)
Platelets: 176 10*3/uL (ref 150–400)
RBC: 4.12 MIL/uL (ref 3.87–5.11)
RDW: 14.7 % (ref 11.5–15.5)
WBC: 13.9 10*3/uL — ABNORMAL HIGH (ref 4.0–10.5)
nRBC: 0 % (ref 0.0–0.2)

## 2023-08-25 LAB — SEDIMENTATION RATE: Sed Rate: 34 mm/hr — ABNORMAL HIGH (ref 0–22)

## 2023-08-25 LAB — GLUCOSE, CAPILLARY
Glucose-Capillary: 136 mg/dL — ABNORMAL HIGH (ref 70–99)
Glucose-Capillary: 173 mg/dL — ABNORMAL HIGH (ref 70–99)
Glucose-Capillary: 145 mg/dL — ABNORMAL HIGH (ref 70–99)
Glucose-Capillary: 171 mg/dL — ABNORMAL HIGH (ref 70–99)

## 2023-08-25 LAB — C-REACTIVE PROTEIN: CRP: 0.5 mg/dL (ref <1.0)

## 2023-08-25 MED ORDER — SULFAMETHOXAZOLE-TRIMETHOPRIM 400-80 MG PO TABS
1.0000 | ORAL_TABLET | Freq: Every day | ORAL | Status: DC
  Administered 2023-08-25 – 2023-08-29 (×5): 1 via ORAL
  Filled 2023-08-25 (×5): qty 1

## 2023-08-25 MED ORDER — FUROSEMIDE 20 MG PO TABS: 20.0000 mg | ORAL_TABLET | Freq: Every day | ORAL | Status: DC

## 2023-08-25 MED ORDER — SULFAMETHOXAZOLE-TRIMETHOPRIM 400-80 MG PO TABS: 1.0000 | ORAL_TABLET | Freq: Two times a day (BID) | ORAL | Status: DC

## 2023-08-25 MED ORDER — NADOLOL 20 MG PO TABS
20.0000 mg | ORAL_TABLET | Freq: Two times a day (BID) | ORAL | Status: DC
  Administered 2023-08-25 – 2023-08-29 (×8): 20 mg via ORAL
  Filled 2023-08-25 (×8): qty 1

## 2023-08-25 NOTE — Progress Notes (Signed)
After discussion with the patient, consulting cardiology for their expertise specifically regarding her new onset dyspnea in the setting of her lupus.

## 2023-08-25 NOTE — Progress Notes (Addendum)
Physical Therapy Treatment Patient Details Name: Monica Ellis MRN: [id number] DOB: 1961/06/21 Today's Date: 08/25/2023   History of Present Illness Pt is a 62 y/o F admitted on 08/23/23 with c/o SOB. s/p Echo found to have Grade II diastolic dysfunction. Plan nasal CPAP trial for OSA. PMH:  lupus, chronic respiratory failure, obesity hypoventilation syndrome, chronic diastolic CHF, DM2.    PT Comments  Pt received up on BSC, pt agreeable to therapy session with emphasis on transfer safety and use of RW for improved posture and activity tolerance. Pt too dyspneic to progress gait/stair training today so instead worked on seated/supine BLE and breathing exercises to build endurance/improve pulmonary clearance. Pt given handouts to reinforce. Pt mostly CGA to Supervision for safety. Plan to work on stair negotiation next session. DME updated per discussion with pt and supervising PT KM. Pt continues to benefit from PT services to progress toward functional mobility goals.     If plan is discharge home, recommend the following: A little help with walking and/or transfers;A little help with bathing/dressing/bathroom;Assistance with cooking/housework;Assist for transportation;Help with stairs or ramp for entrance   Can travel by private vehicle        Equipment Recommendations  Rolling walker (2 wheels)    Recommendations for Other Services       Precautions / Restrictions Precautions Precautions: Fall Restrictions Weight Bearing Restrictions: No     Mobility  Bed Mobility Overal bed mobility: Modified Independent             General bed mobility comments: HOB elevated, use of bed features/rails, mildly unsafe technique (sort of diving forward into bed then rolling around to supine). Would benefit from technique review next session and discussion on home bed set-up to ensure it will be safe for her (unsure if her bed is flat or has rails)    Transfers Overall transfer level:  Needs assistance Equipment used: None, Rolling walker (2 wheels) Transfers: Sit to/from Stand, Bed to chair/wheelchair/BSC Sit to Stand: Supervision Stand pivot transfers: Supervision         General transfer comment: Poor posture when pivoting without AD but no LOB/buckling. With RW, min cues for safe UE placement/technique.    Ambulation/Gait Ambulation/Gait assistance: Contact guard assist Gait Distance (Feet): 4 Feet Assistive device: Rolling walker (2 wheels) Gait Pattern/deviations: Step-to pattern, Trunk flexed, Wide base of support, Decreased stride length       General Gait Details: decreased bil foot clearance, pt dyspneic and with limited carryover of instructions for upright posture and pursed-lip breathing. pt tending to very flexed trunk and mouth breathing despite 6L/min [place] and SpO2 reading 96-99% on 6L with exertion, HR to 80's bpm. Did not check standing BP, could consider next session to see if BP instability may be related to her symptoms of fatigue/dyspnea. Distance limited due to pt c/o 3/4 DOE and fatigue.   Stairs             Wheelchair Mobility     Tilt Bed    Modified Rankin (Stroke Patients Only)       Balance Overall balance assessment: Needs assistance Sitting-balance support: No upper extremity supported Sitting balance-Leahy Scale: Good     Standing balance support: During functional activity, Bilateral upper extremity supported Standing balance-Leahy Scale: Fair Standing balance comment: poor posture wtihout support, fair wtih RW, but impulsive to flex trunk despite AD  Cognition Arousal: Alert Behavior During Therapy: WFL for tasks assessed/performed, Anxious Overall Cognitive Status: Within Functional Limits for tasks assessed                                 General Comments: Anxiety and perseverating on her breathing difficulty limiting her mobility safety/tolerance.  Impulsive to flex forward despite cues on upright trunk/chest posture improving pulmonary clearance and needs dense cues for pursed-lip breathing due to tendency to mouth breathe. Pt receptive to instruction and very pleasant.        Exercises Other Exercises Other Exercises: reviewed supine BLE AROM: ankle pumps, quad sets x5-10 reps ea Other Exercises: IS x 3 reps, handout to reinforce technique (pt achieves ~250-300 mL), encouraged hourly use Other Exercises: handout brought for seated pursed-lip breathing and diaphragmatic breathing exercises to reinforce education given x10 reps PLB Other Exercises: STS x 2 for BLE strengthening    General Comments General comments (skin integrity, edema, etc.): VSS on 6L O2 [place]      Pertinent Vitals/Pain Pain Assessment Pain Assessment: Faces Faces Pain Scale: Hurts a little bit Pain Location: generalized Pain Descriptors / Indicators: Sore Pain Intervention(s): Monitored during session, Repositioned, Limited activity within patient's tolerance    Home Living                          Prior Function            PT Goals (current goals can now be found in the care plan section) Acute Rehab PT Goals Patient Stated Goal: to breathe better PT Goal Formulation: With patient Time For Goal Achievement: 09/07/23 Progress towards PT goals: Progressing toward goals    Frequency    Min 1X/week      PT Plan      Co-evaluation              AM-PAC PT "6 Clicks" Mobility   Outcome Measure  Help needed turning from your back to your side while in a flat bed without using bedrails?: None Help needed moving from lying on your back to sitting on the side of a flat bed without using bedrails?: A Little (flat bed/no rails) Help needed moving to and from a bed to a chair (including a wheelchair)?: A Little (with RW) Help needed standing up from a chair using your arms (e.g., wheelchair or bedside chair)?: A Little (with RW/safety  cues) Help needed to walk in hospital room?: Total (<42ft) Help needed climbing 3-5 steps with a railing? : Total 6 Click Score: 15    End of Session Equipment Utilized During Treatment: Oxygen Activity Tolerance: Patient limited by fatigue;Other (comment);Treatment limited secondary to medical complications (Comment) (DOE 3/4, pt anxiety regarding mobility) Patient left: in bed;with call bell/phone within reach;Other (comment) (heels floated, OK to leave bed alarm off per RN) Nurse Communication: Mobility status;Other (comment) (recommend RW, pt agreeable) PT Visit Diagnosis: Muscle weakness (generalized) (M62.81);Difficulty in walking, not elsewhere classified (R26.2);Other abnormalities of gait and mobility (R26.89);Unsteadiness on feet (R26.81)     Time: 8413-2440 PT Time Calculation (min) (ACUTE ONLY): 18 min  Charges:    $Therapeutic Exercise: 8-22 mins PT General Charges $$ ACUTE PT VISIT: 1 Visit                     Treylon Henard P., PTA Acute Rehabilitation Services Secure Chat Preferred 9a-5:30pm Office: [phone]  Laren Orama M Axell Trigueros 08/25/2023, 6:10 PM

## 2023-08-25 NOTE — Progress Notes (Signed)
PT resting on 5l [place]. No resp distress noted at this time

## 2023-08-25 NOTE — Assessment & Plan Note (Addendum)
Long standing SLE. Higher risk in immunocompromised state. Elevated Sedimentation rate on 9/19 (34) indicates an active inflammatory process at this time.  - Prednisone 10 mg daily - Mycophenolate 1080 daily - Hold off on increasing steroids at this time

## 2023-08-25 NOTE — Assessment & Plan Note (Addendum)
Pt smokes 1/2 pack per day. - Nicotine patches

## 2023-08-25 NOTE — Inpatient Diabetes Management (Signed)
Inpatient Diabetes Program Recommendations  AACE/ADA: New Consensus Statement on Inpatient Glycemic Control (2015)  Target Ranges:  Prepandial:   less than 140 mg/dL      Peak postprandial:   less than 180 mg/dL (1-2 hours)      Critically ill patients:  140 - 180 mg/dL   Lab Results  Component Value Date   GLUCAP 136 (H) 08/25/2023   HGBA1C 6.1 (H) 08/23/2023    Review of Glycemic Control  Latest Reference Range & Units 08/24/23 13:31 08/24/23 14:28 08/24/23 16:07 08/24/23 20:49 08/25/23 08:16  Glucose-Capillary 70 - 99 mg/dL 098 (H) 119 (H) 147 (H) 163 (H) 136 (H)  Diabetes history: DM 2 Outpatient Diabetes medications:  Humalog 75/25 120 units in the AM, 60 units with lunch, and 60 units with dinner Current orders for Inpatient glycemic control:  Novolog 70/30 - 30 units tid with meals  Inpatient Diabetes Program Recommendations:    Blood sugars improved today.  Will follow.   Thanks,  Beryl Meager, RN, BC-ADM Inpatient Diabetes Coordinator Pager [phone]  (8a-5p)

## 2023-08-25 NOTE — Plan of Care (Signed)
  Problem: Education: Goal: Ability to describe self-care measures that may prevent or decrease complications (Diabetes Survival Skills Education) will improve Outcome: Progressing Goal: Individualized Educational Video(s) Outcome: Progressing   Problem: Coping: Goal: Ability to adjust to condition or change in health will improve Outcome: Progressing   Problem: Fluid Volume: Goal: Ability to maintain a balanced intake and output will improve Outcome: Progressing   Problem: Health Behavior/Discharge Planning: Goal: Ability to identify and utilize available resources and services will improve Outcome: Progressing   Problem: Metabolic: Goal: Ability to maintain appropriate glucose levels will improve Outcome: Progressing   Problem: Nutritional: Goal: Maintenance of adequate nutrition will improve Outcome: Progressing Goal: Progress toward achieving an optimal weight will improve Outcome: Progressing   Problem: Skin Integrity: Goal: Risk for impaired skin integrity will decrease Outcome: Progressing   Problem: Tissue Perfusion: Goal: Adequacy of tissue perfusion will improve Outcome: Progressing   Problem: Health Behavior/Discharge Planning: Goal: Ability to manage health-related needs will improve Outcome: Progressing   Problem: Clinical Measurements: Goal: Ability to maintain clinical measurements within normal limits will improve Outcome: Progressing Goal: Will remain free from infection Outcome: Progressing Goal: Diagnostic test results will improve Outcome: Progressing Goal: Respiratory complications will improve Outcome: Progressing Goal: Cardiovascular complication will be avoided Outcome: Progressing   Problem: Activity: Goal: Risk for activity intolerance will decrease Outcome: Progressing   Problem: Nutrition: Goal: Adequate nutrition will be maintained Outcome: Progressing   Problem: Coping: Goal: Level of anxiety will decrease Outcome:  Progressing   Problem: Elimination: Goal: Will not experience complications related to bowel motility Outcome: Progressing Goal: Will not experience complications related to urinary retention Outcome: Progressing   Problem: Pain Managment: Goal: General experience of comfort will improve Outcome: Progressing   Problem: Safety: Goal: Ability to remain free from injury will improve Outcome: Progressing   Problem: Skin Integrity: Goal: Risk for impaired skin integrity will decrease Outcome: Progressing

## 2023-08-25 NOTE — Hospital Course (Addendum)
Monica Ellis is a 62 yo woman with SLE, HTN, Hx CVA, DM2, and Chronic pain who presented 9/17 for acute on chronic dyspnea. She was lying in bed and woke from sleep. Had to increase O2 to 5L from 3L and has not been able to wean herself. She presented to the [facility] ED. Interval events include an increased requirement for oxygen to 6L while inpatient.   Major Events Overview   Major Hospital Problems Addressed During this Admission: Dyspnea Pt has acute on chronic shortness of breath that began this morning. Reports that she was de-satting into the high 70% on her home monitor. Could not get back to baseline with her normal 3L. CTA negative for PE but does show prominence of central pulmonary artery. Previous notes indicate a history of "vanishing lung syndrome", however the differential for her presentation remains wide.  - Pulmonology consult ordered 9/18, appreciate their recs. - Echocardiogram ordered to assess for pulmonary HTN - H/o OHS- consider starting trial of BIPAP/CPAP - AM CMP, CBC, Mg, K - PT/OT  Diabetes mellitus, type 2 (HCC) Pt has history of T2DM associated with her chronic use of prednisone for lupus. Pt is on 75/25 insulin mix at home. The history as provided by the patient is different than the medication reconciliation from her last outpatient appointment. Pt has detailed knowledge of how she responds to insulin. - HgbA1c 6.1 on 9/18 - Today (9/18) - s/p 75 units short acting today and 20 units of semglee- have removed sliding scale and adjusted more similar to home insulin to avoid overtreating with short acting. 70/30 insulin 30 units with lunch and dinner and reassess tomorrow and adjust accordingly - 4x daily CBG   SLE (systemic lupus erythematosus related syndrome) (HCC) Long standing SLE. Higher risk in immunocompromised state.  Nicotine dependence, cigarettes, uncomplicated Pt smokes 1/2 pack per day. - Nicotine patches   Chronic liver disease Pt has  history of chronic liver disease and  autoimmune hepatitis. On Myfortic and prednisone, nadolol, and lactulose, continue home meds    Chronic and Stable Problems:  - Chronic Pain - Oxycontin 12 hr tabs 10 mg Q6 PRN - HTN - Amlodipine 10 mg daily, hydrochlorothiazide 12.5 daily - Hx CVA - (September 2023) - asprin 81 / plavix 75 daily - Consider discontinuing DAPT and going to single therapy. - Depression, anxiety, unspecified - bupropion 150mg  daily, buspar 10 mg BID   FEN/GI: lactulose VTE Prophylaxis: Lovenox   PCP Recommendations: 1 week post discharge BMP

## 2023-08-25 NOTE — Assessment & Plan Note (Addendum)
Pt has history of chronic liver disease and autoimmune hepatitis. On Myfortic and prednisone, nadolol, and lactulose, continue home meds - Lactulose 10 g daily PRN

## 2023-08-25 NOTE — Assessment & Plan Note (Signed)
Pt has acute on chronic shortness of breath that began this morning. Reports that she was de-satting into the high 70% on her home monitor. Could not get back to baseline with her normal 3L. Concern for PE high, CTA ordered.  - Admit to [facility], Dr. Miquel Dunn as attending  - Pulmonary consult considered, holding off for now. - Diurese as indicated  - CTA stat  - Echocardiogram ordered - AM CMP, CBC, Mg, K - PT/OT - Strict I/Os  - Daily weights

## 2023-08-25 NOTE — Progress Notes (Signed)
[facility] Brief Progress Note  S:Received page that patient had dyspneic episode, following dropping O2 from 6 L to 5 L. Patient awoke out of sleep short of breath and in discomfort until O2 was restored to 6 L. Nursing staff told patient this was done to evaluate her need for O2/to see if there was an anxiety component. Patient was disturbed by this and became tearful. Note's that this causes her a lot of distress and is the second time it's happen and gone poorly.    O: BP (!) 150/78   Pulse (!) 58   Temp 98.5 F (36.9 C) (Oral)   Resp 18   SpO2 98%   General: NAD, tearful, conversant Resp: CTABL, no wheezes or rhales, comfortable work of breathing, speaking in full sentences Extremity: No pitting edema  A/P: Dyspnea Patient admitted for dyspnea of unknown cause, and has pending workup. Patient continues to need 6 L of O2 for respiratory support/comfort. Will not attempt to wean patient, without first discussing with patient, as to not scare/shock her. Will continue workup for causes of dyspnea. Patient appears to be stable now and back to her baseline for this admission. No acute indication for imaging or higher level of respiratory support.  - Continue 6 L O2, wean as tolerated, only after discussing with patient prior to - Consider imaging or more respiratory support as needed - Plans per day team   Bess Kinds, MD 08/25/2023, 4:08 AM PGY-3, [facility] Night Resident  Please page [phone] with questions.

## 2023-08-25 NOTE — Progress Notes (Signed)
NAME:  Monica Ellis, MRN:  [id number], DOB:  07-22-1961, LOS: 2 ADMISSION DATE:  08/23/2023, CONSULTATION DATE:  08/25/2023 REFERRING MD:  Westley Chandler, MD, CHIEF COMPLAINT:  hypoxemia   History of Present Illness:  The patient is a 62 year old woman with a complicated past medical history.  Notably she was diagnosed with lupus, autoimmune hepatitis, and is currently on prednisone and Myfortic.  She was seen in pulmonary clinic at [facility] and PFTs showed restriction to ventilation with reduced diffusion capacity.  Her CT scan at that time was without fibrotic lung disease, and there was concern for possible portal pulmonary hypertension.  However she had an echocardiogram that seem to rule out hepatopulmonary syndrome.  Her pulmonologist there felt that her symptoms are most likely secondary to obesity hypoventilation syndrome and she was started on a trilogy vent.  However she was not able to tolerate this for very long and there was some trouble affording the device.  She was recommended undergo pulmonary rehab and weight loss with nocturnal vent therapy.  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  Furthermore she was already on Myfortic and prednisone which would treat shrinking lung syndrome.  She is referred to pulmonary rehab but says she did rehab at home instead.  She says that 6 months ago she actually weaned herself off of all her oxygen therapy.  However the course of the last several weeks she has had worsening shortness of breath and came to the ED because she felt she needed more than her baseline 3 L nasal cannula.  She says she has pain in all her joints all the time secondary to her lupus although she has been on prednisone for many many years.  She denies lower extremity edema, abdominal swelling, orthopnea.  She thinks she actually sleeps quite well.    Pertinent  Medical History  SLE Hypertension Diabetes Autoimmune  hepatitis with resultant cirrhosis Chronic respiratory failure  Significant Hospital Events: Including procedures, antibiotic start and stop dates in addition to other pertinent events     Interim History / Subjective:  No significant change  Objective   Blood pressure (!) 128/102, pulse 63, temperature 97.6 F (36.4 C), temperature source Oral, resp. rate 19, SpO2 96%.        Intake/Output Summary (Last 24 hours) at 08/25/2023 1137 Last data filed at 08/25/2023 0800 Gross per 24 hour  Intake 1060 ml  Output 0 ml  Net 1060 ml   There were no vitals filed for this visit.  Examination: Morbid obese female who short of breath with any activity No JVD lymphadenopathy is appreciated Decreased breath sounds throughout Heart sounds are distant Mild lower extremity edema Upset about the attempt to wean her oxygen during the night.      Resolved Hospital Problem list     Assessment & Plan:  Acute on chronic hypoxemic respiratory failure Suspicion for chronic hypercapnia Suspected untreated OSA with OHS COPD with ongoing tobacco use disorder SLE  Autoimmune hepatitis with compensated cirrhosis - currently on myfortic and prednisone Physical deconditioning  Attempted to wean oxygen by 1 L during the night with patient becoming air very angry about the attempt at lowering her oxygen. She needs to stop smoking Continue to monitor inflammatory markers She needs to wear her CPAP BiPAP machine at night       .  Labs   CBC: Recent Labs  Lab 08/23/23 0618 08/23/23 4098 08/24/23  0981 08/25/23 0508  WBC 10.8*  --  12.3* 13.9*  NEUTROABS 6.7  --   --   --   HGB 12.0 13.3 12.0 12.2  HCT 39.7 39.0 39.2 38.4  MCV 96.1  --  94.9 93.2  PLT 185  --  184 176    Basic Metabolic Panel: Recent Labs  Lab 08/23/23 0618 08/23/23 0649 08/24/23 0834 08/25/23 0507  NA 138 141 133* 134*  K 3.2* 3.3* 4.1 3.5  CL 103  --  98 95*  CO2 29  --  27 31  GLUCOSE 133*  --  419*  93  BUN 14  --  18 23  CREATININE 0.88  --  1.05* 0.92  CALCIUM 8.6*  --  8.7* 8.8*   GFR: Estimated Creatinine Clearance: 90.1 mL/min (by C-G formula based on SCr of 0.92 mg/dL). Recent Labs  Lab 08/23/23 0618 08/24/23 0834 08/25/23 0508  WBC 10.8* 12.3* 13.9*    Liver Function Tests: Recent Labs  Lab 08/23/23 0618 08/24/23 0834 08/25/23 0507  AST 26 19 18   ALT 28 27 26   ALKPHOS 32* 31* 28*  BILITOT 0.8 1.0 1.1  PROT 7.8 8.0 7.8  ALBUMIN 3.1* 3.2* 3.4*   No results for input(s): "LIPASE", "AMYLASE" in the last 168 hours. No results for input(s): "AMMONIA" in the last 168 hours.  ABG    Component Value Date/Time   PHART 7.44 09/04/2022 1213   PCO2ART 46 09/04/2022 1213   PO2ART 41 (L) 09/04/2022 1213   HCO3 31.9 (H) 08/23/2023 0649   TCO2 34 (H) 08/23/2023 0649   O2SAT 99 08/23/2023 0649     Coagulation Profile: No results for input(s): "INR", "PROTIME" in the last 168 hours.  Cardiac Enzymes: No results for input(s): "CKTOTAL", "CKMB", "CKMBINDEX", "TROPONINI" in the last 168 hours.  HbA1C: Hgb A1c MFr Bld  Date/Time Value Ref Range Status  08/23/2023 06:18 AM 6.1 (H) 4.8 - 5.6 % Final    Comment:    (NOTE) Pre diabetes:          5.7%-6.4%  Diabetes:              >6.4%  Glycemic control for   <7.0% adults with diabetes   09/03/2022 12:23 PM 5.6 4.8 - 5.6 % Final    Comment:    (NOTE) Pre diabetes:          5.7%-6.4%  Diabetes:              >6.4%  Glycemic control for   <7.0% adults with diabetes     CBG: Recent Labs  Lab 08/24/23 1331 08/24/23 1428 08/24/23 1607 08/24/23 2049 08/25/23 0816  GLUCAP 292* 263* 237* 163* 136*     Steve Timea Breed ACNP Acute Care Nurse Practitioner [facility] Pul[facility]/Critical Care Please consult Amion 08/25/2023, 11:37 AM

## 2023-08-25 NOTE — Assessment & Plan Note (Addendum)
Pt has acute on chronic shortness of breath that began 9/17 at 4 am. Reports that she was de-satting into the high 70% on her home monitor. Could not get back to baseline with her normal 3L. CTA negative for PE but does show prominence of central pulmonary artery.  Most likely per pulmonary medicine is untreated OSA.  She has difficulty tolerating CPAP.  Given her echo findings consider heart failure with preserved ejection fraction. - Appreciate pulmonology consult and care.  Given findings echocardiogram will consult cardiology.. - Echocardiogram on 9/18 showed Grade II diastolic dysfunction - Patient would like trial of nasal-CPAP for her diagnosed OSA.  Will discuss with respiratory - Decreasing dose of nadolol from 40 mg BID to 20 mg BID to assess for whether decreasing Beta blockade improves respiratory function.  - Trial of Lasix 20 mg daily  - AM CMP, CBC, Mg, K - PT/OT consult placed

## 2023-08-25 NOTE — Assessment & Plan Note (Addendum)
Pt is on multiple immunosuppressive therapies for her Lupus. Prednisone and mycophenolate. Pt has had PJP prophylaxis in the past. Pt is agreeable to starting prophylactic therapy.  - Bactrim 400-80 mg Q12

## 2023-08-25 NOTE — Consult Note (Signed)
11.70 cm/s TAPSE (M-mode): 2.7 cm LEFT ATRIUM             Index        RIGHT ATRIUM           Index LA diam:        4.30 cm 1.78 cm/m   RA Area:     13.80 cm LA Vol (A2C):   81.7 ml 33.77 ml/m  RA Volume:   30.70 ml  12.69 ml/m LA Vol (A4C):   63.0 ml 26.04 ml/m LA Biplane Vol: 74.5 ml 30.79 ml/m  AORTIC VALVE LVOT Vmax:   148.00 cm/s LVOT Vmean:  96.700 cm/s LVOT VTI:    0.342 m  AORTA Ao Root diam: 3.40 cm Ao Asc diam:  3.40 cm MITRAL VALVE MV Area (PHT): 3.21 cm     SHUNTS MV Decel Time: 236 msec      Systemic VTI:  0.34 m MR Peak grad: 59.3 mmHg     Systemic Diam: 1.80 cm MR Vmax:      385.00 cm/s MV E velocity: 154.00 cm/s MV A velocity: 107.00 cm/s MV E/A ratio:  1.44 Mihai Croitoru MD Electronically signed by Thurmon Fair MD Signature Date/Time: 08/24/2023/5:11:27 PM    Final    VAS [place] LOWER EXTREMITY VENOUS (DVT)  Result Date: 08/24/2023  Lower Venous DVT Study Patient Name:  Monica Ellis  Date of Exam:   08/23/2023 Medical Rec #: [id number]             Accession #:    [id number] Date of Birth: 04-Jun-1961             Patient Gender: F Patient Age:   62 years Exam Location:  [facility] Procedure:      VAS [place] LOWER EXTREMITY VENOUS (DVT) Referring Phys: MARGARET PRAY --------------------------------------------------------------------------------  Indications: Swelling.  Risk Factors: None identified. Limitations: Body habitus and poor ultrasound/tissue interface. Comparison Study: No prior studies. Performing Technologist: Chanda Busing RVT  Examination Guidelines: A complete evaluation includes B-mode imaging, spectral Doppler, color Doppler, and power Doppler as needed of all accessible portions of each vessel. Bilateral testing is considered an integral part of a complete examination. Limited examinations for reoccurring indications may be performed as noted. The reflux portion of the exam is performed with the patient in reverse Trendelenburg.  +---------+---------------+---------+-----------+----------+-------------------+ RIGHT    CompressibilityPhasicitySpontaneityPropertiesThrombus Aging      +---------+---------------+---------+-----------+----------+-------------------+ CFV      Full           Yes      Yes                                      +---------+---------------+---------+-----------+----------+-------------------+ SFJ      Full                                                              +---------+---------------+---------+-----------+----------+-------------------+ FV Prox  Full                                                             +---------+---------------+---------+-----------+----------+-------------------+  11.70 cm/s TAPSE (M-mode): 2.7 cm LEFT ATRIUM             Index        RIGHT ATRIUM           Index LA diam:        4.30 cm 1.78 cm/m   RA Area:     13.80 cm LA Vol (A2C):   81.7 ml 33.77 ml/m  RA Volume:   30.70 ml  12.69 ml/m LA Vol (A4C):   63.0 ml 26.04 ml/m LA Biplane Vol: 74.5 ml 30.79 ml/m  AORTIC VALVE LVOT Vmax:   148.00 cm/s LVOT Vmean:  96.700 cm/s LVOT VTI:    0.342 m  AORTA Ao Root diam: 3.40 cm Ao Asc diam:  3.40 cm MITRAL VALVE MV Area (PHT): 3.21 cm     SHUNTS MV Decel Time: 236 msec      Systemic VTI:  0.34 m MR Peak grad: 59.3 mmHg     Systemic Diam: 1.80 cm MR Vmax:      385.00 cm/s MV E velocity: 154.00 cm/s MV A velocity: 107.00 cm/s MV E/A ratio:  1.44 Mihai Croitoru MD Electronically signed by Thurmon Fair MD Signature Date/Time: 08/24/2023/5:11:27 PM    Final    VAS [place] LOWER EXTREMITY VENOUS (DVT)  Result Date: 08/24/2023  Lower Venous DVT Study Patient Name:  Monica Ellis  Date of Exam:   08/23/2023 Medical Rec #: [id number]             Accession #:    [id number] Date of Birth: 04-Jun-1961             Patient Gender: F Patient Age:   62 years Exam Location:  [facility] Procedure:      VAS [place] LOWER EXTREMITY VENOUS (DVT) Referring Phys: MARGARET PRAY --------------------------------------------------------------------------------  Indications: Swelling.  Risk Factors: None identified. Limitations: Body habitus and poor ultrasound/tissue interface. Comparison Study: No prior studies. Performing Technologist: Chanda Busing RVT  Examination Guidelines: A complete evaluation includes B-mode imaging, spectral Doppler, color Doppler, and power Doppler as needed of all accessible portions of each vessel. Bilateral testing is considered an integral part of a complete examination. Limited examinations for reoccurring indications may be performed as noted. The reflux portion of the exam is performed with the patient in reverse Trendelenburg.  +---------+---------------+---------+-----------+----------+-------------------+ RIGHT    CompressibilityPhasicitySpontaneityPropertiesThrombus Aging      +---------+---------------+---------+-----------+----------+-------------------+ CFV      Full           Yes      Yes                                      +---------+---------------+---------+-----------+----------+-------------------+ SFJ      Full                                                              +---------+---------------+---------+-----------+----------+-------------------+ FV Prox  Full                                                             +---------+---------------+---------+-----------+----------+-------------------+  11.70 cm/s TAPSE (M-mode): 2.7 cm LEFT ATRIUM             Index        RIGHT ATRIUM           Index LA diam:        4.30 cm 1.78 cm/m   RA Area:     13.80 cm LA Vol (A2C):   81.7 ml 33.77 ml/m  RA Volume:   30.70 ml  12.69 ml/m LA Vol (A4C):   63.0 ml 26.04 ml/m LA Biplane Vol: 74.5 ml 30.79 ml/m  AORTIC VALVE LVOT Vmax:   148.00 cm/s LVOT Vmean:  96.700 cm/s LVOT VTI:    0.342 m  AORTA Ao Root diam: 3.40 cm Ao Asc diam:  3.40 cm MITRAL VALVE MV Area (PHT): 3.21 cm     SHUNTS MV Decel Time: 236 msec      Systemic VTI:  0.34 m MR Peak grad: 59.3 mmHg     Systemic Diam: 1.80 cm MR Vmax:      385.00 cm/s MV E velocity: 154.00 cm/s MV A velocity: 107.00 cm/s MV E/A ratio:  1.44 Mihai Croitoru MD Electronically signed by Thurmon Fair MD Signature Date/Time: 08/24/2023/5:11:27 PM    Final    VAS [place] LOWER EXTREMITY VENOUS (DVT)  Result Date: 08/24/2023  Lower Venous DVT Study Patient Name:  Monica Ellis  Date of Exam:   08/23/2023 Medical Rec #: [id number]             Accession #:    [id number] Date of Birth: 04-Jun-1961             Patient Gender: F Patient Age:   62 years Exam Location:  [facility] Procedure:      VAS [place] LOWER EXTREMITY VENOUS (DVT) Referring Phys: MARGARET PRAY --------------------------------------------------------------------------------  Indications: Swelling.  Risk Factors: None identified. Limitations: Body habitus and poor ultrasound/tissue interface. Comparison Study: No prior studies. Performing Technologist: Chanda Busing RVT  Examination Guidelines: A complete evaluation includes B-mode imaging, spectral Doppler, color Doppler, and power Doppler as needed of all accessible portions of each vessel. Bilateral testing is considered an integral part of a complete examination. Limited examinations for reoccurring indications may be performed as noted. The reflux portion of the exam is performed with the patient in reverse Trendelenburg.  +---------+---------------+---------+-----------+----------+-------------------+ RIGHT    CompressibilityPhasicitySpontaneityPropertiesThrombus Aging      +---------+---------------+---------+-----------+----------+-------------------+ CFV      Full           Yes      Yes                                      +---------+---------------+---------+-----------+----------+-------------------+ SFJ      Full                                                              +---------+---------------+---------+-----------+----------+-------------------+ FV Prox  Full                                                             +---------+---------------+---------+-----------+----------+-------------------+  11.70 cm/s TAPSE (M-mode): 2.7 cm LEFT ATRIUM             Index        RIGHT ATRIUM           Index LA diam:        4.30 cm 1.78 cm/m   RA Area:     13.80 cm LA Vol (A2C):   81.7 ml 33.77 ml/m  RA Volume:   30.70 ml  12.69 ml/m LA Vol (A4C):   63.0 ml 26.04 ml/m LA Biplane Vol: 74.5 ml 30.79 ml/m  AORTIC VALVE LVOT Vmax:   148.00 cm/s LVOT Vmean:  96.700 cm/s LVOT VTI:    0.342 m  AORTA Ao Root diam: 3.40 cm Ao Asc diam:  3.40 cm MITRAL VALVE MV Area (PHT): 3.21 cm     SHUNTS MV Decel Time: 236 msec      Systemic VTI:  0.34 m MR Peak grad: 59.3 mmHg     Systemic Diam: 1.80 cm MR Vmax:      385.00 cm/s MV E velocity: 154.00 cm/s MV A velocity: 107.00 cm/s MV E/A ratio:  1.44 Mihai Croitoru MD Electronically signed by Thurmon Fair MD Signature Date/Time: 08/24/2023/5:11:27 PM    Final    VAS [place] LOWER EXTREMITY VENOUS (DVT)  Result Date: 08/24/2023  Lower Venous DVT Study Patient Name:  Monica Ellis  Date of Exam:   08/23/2023 Medical Rec #: [id number]             Accession #:    [id number] Date of Birth: 04-Jun-1961             Patient Gender: F Patient Age:   62 years Exam Location:  [facility] Procedure:      VAS [place] LOWER EXTREMITY VENOUS (DVT) Referring Phys: MARGARET PRAY --------------------------------------------------------------------------------  Indications: Swelling.  Risk Factors: None identified. Limitations: Body habitus and poor ultrasound/tissue interface. Comparison Study: No prior studies. Performing Technologist: Chanda Busing RVT  Examination Guidelines: A complete evaluation includes B-mode imaging, spectral Doppler, color Doppler, and power Doppler as needed of all accessible portions of each vessel. Bilateral testing is considered an integral part of a complete examination. Limited examinations for reoccurring indications may be performed as noted. The reflux portion of the exam is performed with the patient in reverse Trendelenburg.  +---------+---------------+---------+-----------+----------+-------------------+ RIGHT    CompressibilityPhasicitySpontaneityPropertiesThrombus Aging      +---------+---------------+---------+-----------+----------+-------------------+ CFV      Full           Yes      Yes                                      +---------+---------------+---------+-----------+----------+-------------------+ SFJ      Full                                                              +---------+---------------+---------+-----------+----------+-------------------+ FV Prox  Full                                                             +---------+---------------+---------+-----------+----------+-------------------+  11.70 cm/s TAPSE (M-mode): 2.7 cm LEFT ATRIUM             Index        RIGHT ATRIUM           Index LA diam:        4.30 cm 1.78 cm/m   RA Area:     13.80 cm LA Vol (A2C):   81.7 ml 33.77 ml/m  RA Volume:   30.70 ml  12.69 ml/m LA Vol (A4C):   63.0 ml 26.04 ml/m LA Biplane Vol: 74.5 ml 30.79 ml/m  AORTIC VALVE LVOT Vmax:   148.00 cm/s LVOT Vmean:  96.700 cm/s LVOT VTI:    0.342 m  AORTA Ao Root diam: 3.40 cm Ao Asc diam:  3.40 cm MITRAL VALVE MV Area (PHT): 3.21 cm     SHUNTS MV Decel Time: 236 msec      Systemic VTI:  0.34 m MR Peak grad: 59.3 mmHg     Systemic Diam: 1.80 cm MR Vmax:      385.00 cm/s MV E velocity: 154.00 cm/s MV A velocity: 107.00 cm/s MV E/A ratio:  1.44 Mihai Croitoru MD Electronically signed by Thurmon Fair MD Signature Date/Time: 08/24/2023/5:11:27 PM    Final    VAS [place] LOWER EXTREMITY VENOUS (DVT)  Result Date: 08/24/2023  Lower Venous DVT Study Patient Name:  Monica Ellis  Date of Exam:   08/23/2023 Medical Rec #: [id number]             Accession #:    [id number] Date of Birth: 04-Jun-1961             Patient Gender: F Patient Age:   62 years Exam Location:  [facility] Procedure:      VAS [place] LOWER EXTREMITY VENOUS (DVT) Referring Phys: MARGARET PRAY --------------------------------------------------------------------------------  Indications: Swelling.  Risk Factors: None identified. Limitations: Body habitus and poor ultrasound/tissue interface. Comparison Study: No prior studies. Performing Technologist: Chanda Busing RVT  Examination Guidelines: A complete evaluation includes B-mode imaging, spectral Doppler, color Doppler, and power Doppler as needed of all accessible portions of each vessel. Bilateral testing is considered an integral part of a complete examination. Limited examinations for reoccurring indications may be performed as noted. The reflux portion of the exam is performed with the patient in reverse Trendelenburg.  +---------+---------------+---------+-----------+----------+-------------------+ RIGHT    CompressibilityPhasicitySpontaneityPropertiesThrombus Aging      +---------+---------------+---------+-----------+----------+-------------------+ CFV      Full           Yes      Yes                                      +---------+---------------+---------+-----------+----------+-------------------+ SFJ      Full                                                              +---------+---------------+---------+-----------+----------+-------------------+ FV Prox  Full                                                             +---------+---------------+---------+-----------+----------+-------------------+  11.70 cm/s TAPSE (M-mode): 2.7 cm LEFT ATRIUM             Index        RIGHT ATRIUM           Index LA diam:        4.30 cm 1.78 cm/m   RA Area:     13.80 cm LA Vol (A2C):   81.7 ml 33.77 ml/m  RA Volume:   30.70 ml  12.69 ml/m LA Vol (A4C):   63.0 ml 26.04 ml/m LA Biplane Vol: 74.5 ml 30.79 ml/m  AORTIC VALVE LVOT Vmax:   148.00 cm/s LVOT Vmean:  96.700 cm/s LVOT VTI:    0.342 m  AORTA Ao Root diam: 3.40 cm Ao Asc diam:  3.40 cm MITRAL VALVE MV Area (PHT): 3.21 cm     SHUNTS MV Decel Time: 236 msec      Systemic VTI:  0.34 m MR Peak grad: 59.3 mmHg     Systemic Diam: 1.80 cm MR Vmax:      385.00 cm/s MV E velocity: 154.00 cm/s MV A velocity: 107.00 cm/s MV E/A ratio:  1.44 Mihai Croitoru MD Electronically signed by Thurmon Fair MD Signature Date/Time: 08/24/2023/5:11:27 PM    Final    VAS [place] LOWER EXTREMITY VENOUS (DVT)  Result Date: 08/24/2023  Lower Venous DVT Study Patient Name:  Monica Ellis  Date of Exam:   08/23/2023 Medical Rec #: [id number]             Accession #:    [id number] Date of Birth: 04-Jun-1961             Patient Gender: F Patient Age:   62 years Exam Location:  [facility] Procedure:      VAS [place] LOWER EXTREMITY VENOUS (DVT) Referring Phys: MARGARET PRAY --------------------------------------------------------------------------------  Indications: Swelling.  Risk Factors: None identified. Limitations: Body habitus and poor ultrasound/tissue interface. Comparison Study: No prior studies. Performing Technologist: Chanda Busing RVT  Examination Guidelines: A complete evaluation includes B-mode imaging, spectral Doppler, color Doppler, and power Doppler as needed of all accessible portions of each vessel. Bilateral testing is considered an integral part of a complete examination. Limited examinations for reoccurring indications may be performed as noted. The reflux portion of the exam is performed with the patient in reverse Trendelenburg.  +---------+---------------+---------+-----------+----------+-------------------+ RIGHT    CompressibilityPhasicitySpontaneityPropertiesThrombus Aging      +---------+---------------+---------+-----------+----------+-------------------+ CFV      Full           Yes      Yes                                      +---------+---------------+---------+-----------+----------+-------------------+ SFJ      Full                                                              +---------+---------------+---------+-----------+----------+-------------------+ FV Prox  Full                                                             +---------+---------------+---------+-----------+----------+-------------------+  estimation, is 60 to 65%. The  left ventricle has normal function. The left ventricle has no regional  wall motion abnormalities. There is moderate concentric left ventricular  hypertrophy. Left ventricular  diastolic parameters are consistent with Grade II diastolic dysfunction  (pseudonormalization). Elevated left atrial pressure.   2. Right ventricular systolic function is normal. The right ventricular  size is normal.   3. Left atrial size was mildly dilated.   4. The mitral valve is normal in structure. No evidence of mitral valve  regurgitation.   5. The aortic valve  is normal in structure. Aortic valve regurgitation is  not visualized.   6. The inferior vena cava is normal in size with <50% respiratory  variability, suggesting right atrial pressure of 8 mmHg.   Comparison(s): No significant change from prior study. Prior images  reviewed side by side.   Laboratory Data:  High Sensitivity Troponin:   Recent Labs  Lab 08/23/23 0618 08/23/23 0946  TROPONINIHS 10 11     Chemistry Recent Labs  Lab 08/23/23 0618 08/23/23 0649 08/24/23 0834 08/25/23 0507  NA 138 141 133* 134*  K 3.2* 3.3* 4.1 3.5  CL 103  --  98 95*  CO2 29  --  27 31  GLUCOSE 133*  --  419* 93  BUN 14  --  18 23  CREATININE 0.88  --  1.05* 0.92  CALCIUM 8.6*  --  8.7* 8.8*  GFRNONAA >60  --  59* >60  ANIONGAP 6  --  8 8    Recent Labs  Lab 08/23/23 0618 08/24/23 0834 08/25/23 0507  PROT 7.8 8.0 7.8  ALBUMIN 3.1* 3.2* 3.4*  AST 26 19 18   ALT 28 27 26   ALKPHOS 32* 31* 28*  BILITOT 0.8 1.0 1.1   Lipids No results for input(s): "CHOL", "TRIG", "HDL", "LABVLDL", "LDLCALC", "CHOLHDL" in the last 168 hours.  Hematology Recent Labs  Lab 08/23/23 0618 08/23/23 0649 08/24/23 0834 08/25/23 0508  WBC 10.8*  --  12.3* 13.9*  RBC 4.13  --  4.13 4.12  HGB 12.0 13.3 12.0 12.2  HCT 39.7 39.0 39.2 38.4  MCV 96.1  --  94.9 93.2  MCH 29.1  --  29.1 29.6  MCHC 30.2  --  30.6 31.8  RDW 15.0  --  14.7 14.7  PLT 185  --  184 176   Thyroid No results for input(s): "TSH", "FREET4" in the last 168 hours.  BNP Recent Labs  Lab 08/23/23 0618  BNP 67.7    DDimer  Recent Labs  Lab 08/23/23 0946  DDIMER 0.52*     Radiology/Studies:  ECHOCARDIOGRAM COMPLETE  Result Date: 08/24/2023    ECHOCARDIOGRAM REPORT   Patient Name:   Danean Ellis Date of Exam: 08/24/2023 Medical Rec #:  [id number]            Height:       66.0 in Accession #:    [id number]           Weight:       313.1 lb Date of Birth:  [birth year]            BSA:          2.419 m Patient Age:    62 years              BP:           148/68 mmHg Patient Gender: F  estimation, is 60 to 65%. The  left ventricle has normal function. The left ventricle has no regional  wall motion abnormalities. There is moderate concentric left ventricular  hypertrophy. Left ventricular  diastolic parameters are consistent with Grade II diastolic dysfunction  (pseudonormalization). Elevated left atrial pressure.   2. Right ventricular systolic function is normal. The right ventricular  size is normal.   3. Left atrial size was mildly dilated.   4. The mitral valve is normal in structure. No evidence of mitral valve  regurgitation.   5. The aortic valve  is normal in structure. Aortic valve regurgitation is  not visualized.   6. The inferior vena cava is normal in size with <50% respiratory  variability, suggesting right atrial pressure of 8 mmHg.   Comparison(s): No significant change from prior study. Prior images  reviewed side by side.   Laboratory Data:  High Sensitivity Troponin:   Recent Labs  Lab 08/23/23 0618 08/23/23 0946  TROPONINIHS 10 11     Chemistry Recent Labs  Lab 08/23/23 0618 08/23/23 0649 08/24/23 0834 08/25/23 0507  NA 138 141 133* 134*  K 3.2* 3.3* 4.1 3.5  CL 103  --  98 95*  CO2 29  --  27 31  GLUCOSE 133*  --  419* 93  BUN 14  --  18 23  CREATININE 0.88  --  1.05* 0.92  CALCIUM 8.6*  --  8.7* 8.8*  GFRNONAA >60  --  59* >60  ANIONGAP 6  --  8 8    Recent Labs  Lab 08/23/23 0618 08/24/23 0834 08/25/23 0507  PROT 7.8 8.0 7.8  ALBUMIN 3.1* 3.2* 3.4*  AST 26 19 18   ALT 28 27 26   ALKPHOS 32* 31* 28*  BILITOT 0.8 1.0 1.1   Lipids No results for input(s): "CHOL", "TRIG", "HDL", "LABVLDL", "LDLCALC", "CHOLHDL" in the last 168 hours.  Hematology Recent Labs  Lab 08/23/23 0618 08/23/23 0649 08/24/23 0834 08/25/23 0508  WBC 10.8*  --  12.3* 13.9*  RBC 4.13  --  4.13 4.12  HGB 12.0 13.3 12.0 12.2  HCT 39.7 39.0 39.2 38.4  MCV 96.1  --  94.9 93.2  MCH 29.1  --  29.1 29.6  MCHC 30.2  --  30.6 31.8  RDW 15.0  --  14.7 14.7  PLT 185  --  184 176   Thyroid No results for input(s): "TSH", "FREET4" in the last 168 hours.  BNP Recent Labs  Lab 08/23/23 0618  BNP 67.7    DDimer  Recent Labs  Lab 08/23/23 0946  DDIMER 0.52*     Radiology/Studies:  ECHOCARDIOGRAM COMPLETE  Result Date: 08/24/2023    ECHOCARDIOGRAM REPORT   Patient Name:   Danean Ellis Date of Exam: 08/24/2023 Medical Rec #:  [id number]            Height:       66.0 in Accession #:    [id number]           Weight:       313.1 lb Date of Birth:  [birth year]            BSA:          2.419 m Patient Age:    62 years              BP:           148/68 mmHg Patient Gender: F  11.70 cm/s TAPSE (M-mode): 2.7 cm LEFT ATRIUM             Index        RIGHT ATRIUM           Index LA diam:        4.30 cm 1.78 cm/m   RA Area:     13.80 cm LA Vol (A2C):   81.7 ml 33.77 ml/m  RA Volume:   30.70 ml  12.69 ml/m LA Vol (A4C):   63.0 ml 26.04 ml/m LA Biplane Vol: 74.5 ml 30.79 ml/m  AORTIC VALVE LVOT Vmax:   148.00 cm/s LVOT Vmean:  96.700 cm/s LVOT VTI:    0.342 m  AORTA Ao Root diam: 3.40 cm Ao Asc diam:  3.40 cm MITRAL VALVE MV Area (PHT): 3.21 cm     SHUNTS MV Decel Time: 236 msec      Systemic VTI:  0.34 m MR Peak grad: 59.3 mmHg     Systemic Diam: 1.80 cm MR Vmax:      385.00 cm/s MV E velocity: 154.00 cm/s MV A velocity: 107.00 cm/s MV E/A ratio:  1.44 Mihai Croitoru MD Electronically signed by Thurmon Fair MD Signature Date/Time: 08/24/2023/5:11:27 PM    Final    VAS [place] LOWER EXTREMITY VENOUS (DVT)  Result Date: 08/24/2023  Lower Venous DVT Study Patient Name:  Monica Ellis  Date of Exam:   08/23/2023 Medical Rec #: [id number]             Accession #:    [id number] Date of Birth: 04-Jun-1961             Patient Gender: F Patient Age:   62 years Exam Location:  [facility] Procedure:      VAS [place] LOWER EXTREMITY VENOUS (DVT) Referring Phys: MARGARET PRAY --------------------------------------------------------------------------------  Indications: Swelling.  Risk Factors: None identified. Limitations: Body habitus and poor ultrasound/tissue interface. Comparison Study: No prior studies. Performing Technologist: Chanda Busing RVT  Examination Guidelines: A complete evaluation includes B-mode imaging, spectral Doppler, color Doppler, and power Doppler as needed of all accessible portions of each vessel. Bilateral testing is considered an integral part of a complete examination. Limited examinations for reoccurring indications may be performed as noted. The reflux portion of the exam is performed with the patient in reverse Trendelenburg.  +---------+---------------+---------+-----------+----------+-------------------+ RIGHT    CompressibilityPhasicitySpontaneityPropertiesThrombus Aging      +---------+---------------+---------+-----------+----------+-------------------+ CFV      Full           Yes      Yes                                      +---------+---------------+---------+-----------+----------+-------------------+ SFJ      Full                                                              +---------+---------------+---------+-----------+----------+-------------------+ FV Prox  Full                                                             +---------+---------------+---------+-----------+----------+-------------------+  11.70 cm/s TAPSE (M-mode): 2.7 cm LEFT ATRIUM             Index        RIGHT ATRIUM           Index LA diam:        4.30 cm 1.78 cm/m   RA Area:     13.80 cm LA Vol (A2C):   81.7 ml 33.77 ml/m  RA Volume:   30.70 ml  12.69 ml/m LA Vol (A4C):   63.0 ml 26.04 ml/m LA Biplane Vol: 74.5 ml 30.79 ml/m  AORTIC VALVE LVOT Vmax:   148.00 cm/s LVOT Vmean:  96.700 cm/s LVOT VTI:    0.342 m  AORTA Ao Root diam: 3.40 cm Ao Asc diam:  3.40 cm MITRAL VALVE MV Area (PHT): 3.21 cm     SHUNTS MV Decel Time: 236 msec      Systemic VTI:  0.34 m MR Peak grad: 59.3 mmHg     Systemic Diam: 1.80 cm MR Vmax:      385.00 cm/s MV E velocity: 154.00 cm/s MV A velocity: 107.00 cm/s MV E/A ratio:  1.44 Mihai Croitoru MD Electronically signed by Thurmon Fair MD Signature Date/Time: 08/24/2023/5:11:27 PM    Final    VAS [place] LOWER EXTREMITY VENOUS (DVT)  Result Date: 08/24/2023  Lower Venous DVT Study Patient Name:  Monica Ellis  Date of Exam:   08/23/2023 Medical Rec #: [id number]             Accession #:    [id number] Date of Birth: 04-Jun-1961             Patient Gender: F Patient Age:   62 years Exam Location:  [facility] Procedure:      VAS [place] LOWER EXTREMITY VENOUS (DVT) Referring Phys: MARGARET PRAY --------------------------------------------------------------------------------  Indications: Swelling.  Risk Factors: None identified. Limitations: Body habitus and poor ultrasound/tissue interface. Comparison Study: No prior studies. Performing Technologist: Chanda Busing RVT  Examination Guidelines: A complete evaluation includes B-mode imaging, spectral Doppler, color Doppler, and power Doppler as needed of all accessible portions of each vessel. Bilateral testing is considered an integral part of a complete examination. Limited examinations for reoccurring indications may be performed as noted. The reflux portion of the exam is performed with the patient in reverse Trendelenburg.  +---------+---------------+---------+-----------+----------+-------------------+ RIGHT    CompressibilityPhasicitySpontaneityPropertiesThrombus Aging      +---------+---------------+---------+-----------+----------+-------------------+ CFV      Full           Yes      Yes                                      +---------+---------------+---------+-----------+----------+-------------------+ SFJ      Full                                                              +---------+---------------+---------+-----------+----------+-------------------+ FV Prox  Full                                                             +---------+---------------+---------+-----------+----------+-------------------+  estimation, is 60 to 65%. The  left ventricle has normal function. The left ventricle has no regional  wall motion abnormalities. There is moderate concentric left ventricular  hypertrophy. Left ventricular  diastolic parameters are consistent with Grade II diastolic dysfunction  (pseudonormalization). Elevated left atrial pressure.   2. Right ventricular systolic function is normal. The right ventricular  size is normal.   3. Left atrial size was mildly dilated.   4. The mitral valve is normal in structure. No evidence of mitral valve  regurgitation.   5. The aortic valve  is normal in structure. Aortic valve regurgitation is  not visualized.   6. The inferior vena cava is normal in size with <50% respiratory  variability, suggesting right atrial pressure of 8 mmHg.   Comparison(s): No significant change from prior study. Prior images  reviewed side by side.   Laboratory Data:  High Sensitivity Troponin:   Recent Labs  Lab 08/23/23 0618 08/23/23 0946  TROPONINIHS 10 11     Chemistry Recent Labs  Lab 08/23/23 0618 08/23/23 0649 08/24/23 0834 08/25/23 0507  NA 138 141 133* 134*  K 3.2* 3.3* 4.1 3.5  CL 103  --  98 95*  CO2 29  --  27 31  GLUCOSE 133*  --  419* 93  BUN 14  --  18 23  CREATININE 0.88  --  1.05* 0.92  CALCIUM 8.6*  --  8.7* 8.8*  GFRNONAA >60  --  59* >60  ANIONGAP 6  --  8 8    Recent Labs  Lab 08/23/23 0618 08/24/23 0834 08/25/23 0507  PROT 7.8 8.0 7.8  ALBUMIN 3.1* 3.2* 3.4*  AST 26 19 18   ALT 28 27 26   ALKPHOS 32* 31* 28*  BILITOT 0.8 1.0 1.1   Lipids No results for input(s): "CHOL", "TRIG", "HDL", "LABVLDL", "LDLCALC", "CHOLHDL" in the last 168 hours.  Hematology Recent Labs  Lab 08/23/23 0618 08/23/23 0649 08/24/23 0834 08/25/23 0508  WBC 10.8*  --  12.3* 13.9*  RBC 4.13  --  4.13 4.12  HGB 12.0 13.3 12.0 12.2  HCT 39.7 39.0 39.2 38.4  MCV 96.1  --  94.9 93.2  MCH 29.1  --  29.1 29.6  MCHC 30.2  --  30.6 31.8  RDW 15.0  --  14.7 14.7  PLT 185  --  184 176   Thyroid No results for input(s): "TSH", "FREET4" in the last 168 hours.  BNP Recent Labs  Lab 08/23/23 0618  BNP 67.7    DDimer  Recent Labs  Lab 08/23/23 0946  DDIMER 0.52*     Radiology/Studies:  ECHOCARDIOGRAM COMPLETE  Result Date: 08/24/2023    ECHOCARDIOGRAM REPORT   Patient Name:   Danean Ellis Date of Exam: 08/24/2023 Medical Rec #:  [id number]            Height:       66.0 in Accession #:    [id number]           Weight:       313.1 lb Date of Birth:  [birth year]            BSA:          2.419 m Patient Age:    62 years              BP:           148/68 mmHg Patient Gender: F

## 2023-08-25 NOTE — Progress Notes (Addendum)
Daily Progress Note Intern Pager: [phone]  Patient name: Monica Ellis Medical record number: [id number] Date of birth: 1961-10-06 Age: 62 y.o. Gender: female  Primary Care Provider: Annita Brod, MD Consultants: Erline Hau Code Status: DNR, Interventions: may intubate, use advanced airway interventions and cardioversion/ACLS medications if appropriate or indicated. May transfer to ICU.  Pt Overview and Major Events to Date:  Monica Ellis is a 62 yo woman with SLE, HTN, Hx CVA, DM2, and Chronic pain who presented 9/17 for acute on chronic dyspnea. She was lying in bed and woke from sleep. Had to increase O2 to 5L from 3L and has not been able to wean herself.  The cause of her worsening symptoms is unclear at this time.  Differential includes acute exacerbation of diastolic heart failure, untreated OSA as she has difficulty with the CPAP/BiPAP device and possibly shrinking lung disease.  Assessment and Plan: Assessment & Plan Dyspnea Pt has acute on chronic shortness of breath that began 9/17 at 4 am. Reports that she was de-satting into the high 70% on her home monitor. Could not get back to baseline with her normal 3L. CTA negative for PE but does show prominence of central pulmonary artery.  Most likely per pulmonary medicine is untreated OSA.  She has difficulty tolerating CPAP.  Given her echo findings consider heart failure with preserved ejection fraction. - Appreciate pulmonology consult and care.  Given findings echocardiogram will consult cardiology.. - Echocardiogram on 9/18 showed Grade II diastolic dysfunction - Patient would like trial of nasal-CPAP for her diagnosed OSA.  Will discuss with respiratory - Decreasing dose of nadolol from 40 mg BID to 20 mg BID to assess for whether decreasing Beta blockade improves respiratory function.  - Trial of Lasix 20 mg daily  - AM CMP, CBC, Mg, K - PT/OT consult placed  Diabetes mellitus, type 2 (HCC) Pt has history of  T2DM associated with her chronic use of prednisone for lupus. Pt is on 75/25 insulin mix at home. The history as provided by the patient is different than the medication reconciliation from her last outpatient appointment. Pt has detailed knowledge of how she responds to insulin. - HgbA1c 6.1 on 9/18 - 9/19 - 30 units aspart 70/30 mix TID.  - 4x daily CBG SLE (systemic lupus erythematosus related syndrome) (HCC) Long standing SLE. Higher risk in immunocompromised state. Elevated Sedimentation rate on 9/19 (34) indicates an active inflammatory process at this time.  - Prednisone 10 mg daily - Mycophenolate 1080 daily - Hold off on increasing steroids at this time  Nicotine dependence, cigarettes, uncomplicated Pt smokes 1/2 pack per day. - Nicotine patches  Chronic liver disease Pt has history of chronic liver disease and autoimmune hepatitis. On Myfortic and prednisone, nadolol, and lactulose, continue home meds - Lactulose 10 g daily PRN Immunosuppression due to chronic steroid use (HCC) Pt is on multiple immunosuppressive therapies for her Lupus. Prednisone and mycophenolate. Pt has had PJP prophylaxis in the past. Pt is agreeable to starting prophylactic therapy.  - Bactrim 400-80 mg Q12   Chronic and Stable Problems:  - Chronic Pain - Oxycontin 12 hr tabs 10 mg Q6 PRN - HTN - Amlodipine 10 mg daily, hydrochlorothiazide 12.5 daily - Hx CVA - (September 2023) - asprin 81 / plavix 75 daily - Consider discontinuing DAPT and going to single therapy. - Depression, anxiety, unspecified - bupropion 150mg  daily, buspar 10 mg BID   FEN/GI: lactulose 10 g PRN VTE Prophylaxis: Lovenox     Subjective:  Pt this morning was awake and alert during exam. Met patient with PharmD Georgina Snell. Pt was sitting upright during exam. Said she had a difficult night with the aforementioned dropping of the [place] O2. She appreciated that the team was trying to help, but did not appreciate that interaction. Pt  was amenable to trying to add on a trial of diuresis via lasix, and decreasing beta blockade. She was also willing to start PJP prophylaxis for her chronic immunosuppressive state. Pt mentioned she had constipation, told her she can request lactulose from nursing to assist.   Objective: Temp:  [97.6 F (36.4 C)-98.5 F (36.9 C)] 97.6 F (36.4 C) (09/19 0520) Pulse Rate:  [54-63] 63 (09/19 0819) Resp:  [18-19] 19 (09/19 0819) BP: (128-150)/(68-102) 128/102 (09/19 0819) SpO2:  [96 %-99 %] 96 % (09/19 0819)  Physical Exam: General: Pt is obese woman, AxO4. Pt is cooperative with exam.  Cardiovascular: Heart sounds distant, difficult to auscultate. RRR. No murmurs or rubs appreciated. Respiratory: Lung sounds CTAB, no wheezes or rhonchus but lung sounds difficult to hear in lower lobes.  Abdomen: NT/ND, but difficult to assess due to patient habitus.  Extremities: Leg swelling has returned to patient's baseline. No cyanosis or clubbing.   Laboratory: Most recent CBC Lab Results  Component Value Date   WBC 13.9 (H) 08/25/2023   HGB 12.2 08/25/2023   HCT 38.4 08/25/2023   MCV 93.2 08/25/2023   PLT 176 08/25/2023   Most recent BMP    Latest Ref Rng & Units 08/25/2023    5:07 AM  BMP  Glucose 70 - 99 mg/dL 93   BUN 8 - 23 mg/dL 23   Creatinine 1.61 - 1.00 mg/dL 0.96   Sodium 045 - 409 mmol/L 134   Potassium 3.5 - 5.1 mmol/L 3.5   Chloride 98 - 111 mmol/L 95   CO2 22 - 32 mmol/L 31   Calcium 8.9 - 10.3 mg/dL 8.8     Other pertinent labs:    Latest Reference Range & Units 08/25/23 05:08  CRP <1.0 mg/dL 0.5    Imaging/Diagnostic Tests: Echocardiogram 9/18 IMPRESSIONS   1. Left ventricular ejection fraction, by estimation, is 60 to 65%. The  left ventricle has normal function. The left ventricle has no regional  wall motion abnormalities. There is moderate concentric left ventricular  hypertrophy. Left ventricular  diastolic parameters are consistent with Grade II  diastolic dysfunction  (pseudonormalization). Elevated left atrial pressure.   2. Right ventricular systolic function is normal. The right ventricular  size is normal.   3. Left atrial size was mildly dilated.   4. The mitral valve is normal in structure. No evidence of mitral valve  regurgitation.   5. The aortic valve is normal in structure. Aortic valve regurgitation is  not visualized.   6. The inferior vena cava is normal in size with <50% respiratory  variability, suggesting right atrial pressure of 8 mmHg.   Westley Chandler, MD 08/25/2023, 2:00 PM  PGY-1, [facility] Family [facility] FPTS Intern pager: [phone], text pages welcome Secure chat group [facility] [place] [place] Teaching Service

## 2023-08-25 NOTE — Assessment & Plan Note (Addendum)
Pt has history of T2DM associated with her chronic use of prednisone for lupus. Pt is on 75/25 insulin mix at home. The history as provided by the patient is different than the medication reconciliation from her last outpatient appointment. Pt has detailed knowledge of how she responds to insulin. - HgbA1c 6.1 on 9/18 - 9/19 - 30 units aspart 70/30 mix TID.  - 4x daily CBG

## 2023-08-26 ENCOUNTER — Other Ambulatory Visit (HOSPITAL_COMMUNITY): Payer: Self-pay

## 2023-08-26 DIAGNOSIS — I5189 Other ill-defined heart diseases: Secondary | ICD-10-CM | POA: Diagnosis not present

## 2023-08-26 DIAGNOSIS — R0602 Shortness of breath: Secondary | ICD-10-CM | POA: Diagnosis not present

## 2023-08-26 DIAGNOSIS — J9621 Acute and chronic respiratory failure with hypoxia: Secondary | ICD-10-CM | POA: Diagnosis not present

## 2023-08-26 LAB — BASIC METABOLIC PANEL
Anion gap: 8 (ref 5–15)
BUN: 26 mg/dL — ABNORMAL HIGH (ref 8–23)
CO2: 31 mmol/L (ref 22–32)
Calcium: 9.3 mg/dL (ref 8.9–10.3)
Chloride: 96 mmol/L — ABNORMAL LOW (ref 98–111)
Creatinine, Ser: 1.06 mg/dL — ABNORMAL HIGH (ref 0.44–1.00)
GFR, Estimated: 59 mL/min — ABNORMAL LOW (ref >60)
Glucose, Bld: 181 mg/dL — ABNORMAL HIGH (ref 70–99)
Potassium: 3.6 mmol/L (ref 3.5–5.1)
Sodium: 135 mmol/L (ref 135–145)

## 2023-08-26 LAB — CBC
HCT: 41.3 % (ref 36.0–46.0)
Hemoglobin: 13 g/dL (ref 12.0–15.0)
MCH: 29.5 pg (ref 26.0–34.0)
MCHC: 31.5 g/dL (ref 30.0–36.0)
MCV: 93.7 fL (ref 80.0–100.0)
Platelets: 196 10*3/uL (ref 150–400)
RBC: 4.41 MIL/uL (ref 3.87–5.11)
RDW: 14.8 % (ref 11.5–15.5)
WBC: 10.4 10*3/uL (ref 4.0–10.5)
nRBC: 0 % (ref 0.0–0.2)

## 2023-08-26 LAB — GLUCOSE, CAPILLARY
Glucose-Capillary: 161 mg/dL — ABNORMAL HIGH (ref 70–99)
Glucose-Capillary: 181 mg/dL — ABNORMAL HIGH (ref 70–99)
Glucose-Capillary: 188 mg/dL — ABNORMAL HIGH (ref 70–99)
Glucose-Capillary: 166 mg/dL — ABNORMAL HIGH (ref 70–99)

## 2023-08-26 LAB — ANA W/REFLEX IF POSITIVE: Anti Nuclear Antibody (ANA): NEGATIVE

## 2023-08-26 LAB — MAGNESIUM: Magnesium: 1.7 mg/dL (ref 1.7–2.4)

## 2023-08-26 LAB — ANTI-DNA ANTIBODY, DOUBLE-STRANDED: ds DNA Ab: <1 IU/mL (ref 0–9)

## 2023-08-26 MED ORDER — SPIRONOLACTONE 12.5 MG HALF TABLET
12.5000 mg | ORAL_TABLET | Freq: Every day | ORAL | Status: DC
  Filled 2023-08-26 (×4): qty 1

## 2023-08-26 MED ORDER — ONDANSETRON 4 MG PO TBDP
4.0000 mg | ORAL_TABLET | Freq: Once | ORAL | Status: AC
  Administered 2023-08-26: 4 mg via ORAL
  Filled 2023-08-26: qty 1

## 2023-08-26 MED ORDER — EMPAGLIFLOZIN 10 MG PO TABS
10.0000 mg | ORAL_TABLET | Freq: Every day | ORAL | Status: DC
  Filled 2023-08-26 (×4): qty 1

## 2023-08-26 MED ORDER — FUROSEMIDE 10 MG/ML IJ SOLN
40.0000 mg | Freq: Once | INTRAMUSCULAR | Status: AC
  Administered 2023-08-26: 40 mg via INTRAVENOUS
  Filled 2023-08-26: qty 4

## 2023-08-26 NOTE — Plan of Care (Signed)
Problem: Education: Goal: Ability to describe self-care measures that may prevent or decrease complications (Diabetes Survival Skills Education) will improve Outcome: Progressing Goal: Individualized Educational Video(s) Outcome: Progressing   Problem: Coping: Goal: Ability to adjust to condition or change in health will improve Outcome: Progressing   Problem: Fluid Volume: Goal: Ability to maintain a balanced intake and output will improve Outcome: Progressing   Problem: Health Behavior/Discharge Planning: Goal: Ability to identify and utilize available resources and services will improve Outcome: Progressing Goal: Ability to manage health-related needs will improve Outcome: Progressing   Problem: Metabolic: Goal: Ability to maintain appropriate glucose levels will improve Outcome: Progressing   Problem: Nutritional: Goal: Maintenance of adequate nutrition will improve Outcome: Progressing Goal: Progress toward achieving an optimal weight will improve Outcome: Progressing   Problem: Skin Integrity: Goal: Risk for impaired skin integrity will decrease Outcome: Progressing   Problem: Tissue Perfusion: Goal: Adequacy of tissue perfusion will improve Outcome: Progressing   Problem: Health Behavior/Discharge Planning: Goal: Ability to manage health-related needs will improve Outcome: Progressing   Problem: Clinical Measurements: Goal: Ability to maintain clinical measurements within normal limits will improve Outcome: Progressing Goal: Will remain free from infection Outcome: Progressing Goal: Diagnostic test results will improve Outcome: Progressing Goal: Respiratory complications will improve Outcome: Progressing Goal: Cardiovascular complication will be avoided Outcome: Progressing   Problem: Activity: Goal: Risk for activity intolerance will decrease Outcome: Progressing   Problem: Nutrition: Goal: Adequate nutrition will be maintained Outcome:  Progressing   Problem: Coping: Goal: Level of anxiety will decrease Outcome: Progressing   Problem: Elimination: Goal: Will not experience complications related to bowel motility Outcome: Progressing Goal: Will not experience complications related to urinary retention Outcome: Progressing   Problem: Pain Managment: Goal: General experience of comfort will improve Outcome: Progressing   Problem: Safety: Goal: Ability to remain free from injury will improve Outcome: Progressing   Problem: Skin Integrity: Goal: Risk for impaired skin integrity will decrease Outcome: Progressing

## 2023-08-26 NOTE — Assessment & Plan Note (Signed)
Long standing SLE. Higher risk in immunocompromised state. Elevated Sedimentation rate on 9/19 (34) indicates an active inflammatory process at this time.  - Prednisone 10 mg daily - Mycophenolate 1080 daily - Hold off on increasing steroids at this time

## 2023-08-26 NOTE — Assessment & Plan Note (Signed)
Pt smokes 1/2 pack per day. - Nicotine patches

## 2023-08-26 NOTE — Assessment & Plan Note (Signed)
Pt has history of T2DM associated with her chronic use of prednisone for lupus. Pt is on 75/25 insulin mix at home. The history as provided by the patient is different than the medication reconciliation from her last outpatient appointment. Pt has detailed knowledge of how she responds to insulin. - HgbA1c 6.1 on 9/18 - 9/19 - 30 units aspart 70/30 mix TID.  - 4x daily CBG

## 2023-08-26 NOTE — Progress Notes (Signed)
Daily Progress Note Intern Pager: [phone]  Patient name: Monica Ellis Medical record number: [id number] Date of birth: [birth year] Age: 62 y.o. Gender: female  Primary Care Provider: Annita Brod, MD Consultants: Erline Hau, Cardiology Code Status: DNR, Interventions: may intubate, use advanced airway interventions and cardioversion/ACLS medications if appropriate or indicated. May transfer to ICU.  Pt Overview and Major Events to Date:  Monica Ellis is a 62 yo woman with SLE, HTN, Hx CVA, DM2, and Chronic pain who presented 9/17 for acute on chronic dyspnea. Had to increase O2 to 5L from 3L and has not been able to wean herself.  The cause of her worsening symptoms is unclear at this time. Differential includes acute exacerbation of diastolic heart failure, untreated OSA/OHS as she has difficulty with the CPAP/BiPAP device.  Current working theory is OSA/OHS as the primary driving etiology of her dyspnea.  Assessment and Plan: Assessment & Plan Acute on chronic respiratory failure with hypoxemia (HCC) Pt has acute on chronic shortness of breath that began 9/17 at 4 am. Reports that she was de-satting into the high 70% on her home monitor. Could not get back to baseline with her normal 3L. CTA negative for PE but does show prominence of central pulmonary artery.  Most likely per pulmonary medicine is untreated OSA.  She has difficulty tolerating CPAP.  Given her echo findings consider heart failure with preserved ejection fraction. - Patient had nasal CPAP therapy trial 9/19. Was on it for four hours last night, tolerated it well. - Coordinating care with Pulm and outpatient home health to get CPAP at home. - Decreased dose of nadolol from 40 mg BID to 20 mg BID to assess for whether decreasing Beta blockade improves respiratory function.  - AM CMP, CBC, Mg - PT/OT consult placed Diabetes mellitus, type 2 (HCC) Pt has history of T2DM associated with her chronic use of  prednisone for lupus. Pt is on 75/25 insulin mix at home. The history as provided by the patient is different than the medication reconciliation from her last outpatient appointment. Pt has detailed knowledge of how she responds to insulin. - HgbA1c 6.1 on 9/18 - 9/19 - 30 units aspart 70/30 mix TID.  - 4x daily CBG SLE (systemic lupus erythematosus related syndrome) (HCC) Long standing SLE. Higher risk in immunocompromised state. Elevated Sedimentation rate on 9/19 (34) indicates an active inflammatory process at this time.  - Prednisone 10 mg daily - Mycophenolate 1080 daily - Hold off on increasing steroids at this time Nicotine dependence, cigarettes, uncomplicated Pt smokes 1/2 pack per day. - Nicotine patches  Chronic liver disease Pt has history of chronic liver disease and autoimmune hepatitis. On Myfortic and prednisone, nadolol, and lactulose, continue home meds - Lactulose 10 g daily PRN Immunosuppression due to chronic steroid use (HCC) Pt is on multiple immunosuppressive therapies for her Lupus. Prednisone and mycophenolate. Pt has had PJP prophylaxis via Bactrim SS in the past. Pt is agreeable to starting prophylactic therapy, mentioned that she had stopped this in July.  - Bactrim 400-80 mg daily Grade II diastolic dysfunction Identified during echocardiogram on 9/19. Per cardiology recommendations, Pt is to start on a couple of therapies. Pt was offered the opportunity but did not want to start these therapies today.  - SGLT2- jiardiance 10 mg daily (pt refused on 9/20, reassess 9/21) - Spironolactone - 12.5 mg daily (pt refused on 9/20, reassess 9/21)  Chronic and Stable Problems:  - Chronic Pain - Oxycontin 12 hr tabs 10 mg  Q6 PRN - HTN - Amlodipine 10 mg daily, hydrochlorothiazide 12.5 daily - Hx CVA - (September 2023) - asprin 81 / plavix 75 daily - Consider discontinuing DAPT and going to single therapy. - Depression, anxiety, unspecified - bupropion 150mg  daily,  buspar 10 mg BID   FEN/GI: lactulose 10 g PRN VTE Prophylaxis: Lovenox     Subjective:  Pt this morning was awake and alert during exam. Met patient with PharmD Georgina Snell. Pt was sitting upright during exam. Pt felt well this morning. Pt was pleased by subjective improvement in symptoms since yesterday. She reported success of CPAP nasal mask trial yesterday. As far as her O2, she has gone down to 5L overnight, did brief in-room trial of 4L. She failed that trial, but was not acutely agitated. Pt had mild nausea, but no vomit. Constipation was relieved by lactulose.  Objective: Temp:  [97.7 F (36.5 C)-99.1 F (37.3 C)] 98.5 F (36.9 C) (09/20 0930) Pulse Rate:  [52-75] 64 (09/20 0930) Resp:  [17-22] 17 (09/20 0930) BP: (119-143)/(49-74) 133/74 (09/20 0930) SpO2:  [97 %-99 %] 97 % (09/20 0930) FiO2 (%):  [44 %] 44 % (09/19 2315)  Physical Exam: General: Pt is obese woman, AxO4. Pt is cooperative with exam.  Cardiovascular: Heart sounds distant, difficult to auscultate. RRR. No murmurs or rubs appreciated. Respiratory: Lung sounds CTAB, Pt taking deeper breaths and lung sounds go down further.  Abdomen: NT/ND, but difficult to assess due to patient habitus.  Extremities: Leg swelling has returned to patient's baseline. No cyanosis or clubbing.   Laboratory: Most recent CBC Lab Results  Component Value Date   WBC 10.4 08/26/2023   HGB 13.0 08/26/2023   HCT 41.3 08/26/2023   MCV 93.7 08/26/2023   PLT 196 08/26/2023   Most recent BMP    Latest Ref Rng & Units 08/26/2023    8:53 AM  BMP  Glucose 70 - 99 mg/dL 098   BUN 8 - 23 mg/dL 26   Creatinine 1.19 - 1.00 mg/dL 1.47   Sodium 829 - 562 mmol/L 135   Potassium 3.5 - 5.1 mmol/L 3.6   Chloride 98 - 111 mmol/L 96   CO2 22 - 32 mmol/L 31   Calcium 8.9 - 10.3 mg/dL 9.3     Other pertinent labs:  Imaging/Diagnostic Tests: No interval exams Margaretmary Dys, MD 08/26/2023, 1:40 PM  PGY-1, [facility]  Family Medicine FPTS Intern pager: [phone], text pages welcome Secure chat group [facility] Teaching Service

## 2023-08-26 NOTE — Assessment & Plan Note (Addendum)
Pt has acute on chronic shortness of breath that began 9/17 at 4 am. Reports that she was de-satting into the high 70% on her home monitor. Could not get back to baseline with her normal 3L. CTA negative for PE but does show prominence of central pulmonary artery.  Most likely per pulmonary medicine is untreated OSA.  She has difficulty tolerating CPAP.  Given her echo findings consider heart failure with preserved ejection fraction. - Patient had nasal CPAP therapy trial 9/19. Was on it for four hours last night, tolerated it well. - Coordinating care with Pulm and outpatient home health to get CPAP at home. - Decreased dose of nadolol from 40 mg BID to 20 mg BID to assess for whether decreasing Beta blockade improves respiratory function.  - AM CMP, CBC, Mg - PT/OT consult placed

## 2023-08-26 NOTE — Progress Notes (Signed)
OT Cancellation Note  Patient Details Name: Monica Ellis MRN: [id number] DOB: 28-Jul-1961   Cancelled Treatment:    Reason Eval/Treat Not Completed: Patient declined, no reason specified (Pt refused mobility with OT/PT today, reports having increased dizziness today.)  08/26/2023  AB, OTR/L  Acute Rehabilitation Services  Office: [phone]  Monica Ellis 08/26/2023, 2:10 PM

## 2023-08-26 NOTE — Assessment & Plan Note (Addendum)
Pt is on multiple immunosuppressive therapies for her Lupus. Prednisone and mycophenolate. Pt has had PJP prophylaxis via Bactrim SS in the past. Pt is agreeable to starting prophylactic therapy, mentioned that she had stopped this in July.  - Bactrim 400-80 mg daily

## 2023-08-26 NOTE — Progress Notes (Signed)
PT Cancellation Note  Patient Details Name: Monica Ellis MRN: [id number] DOB: July 04, 1961   Cancelled Treatment:    Reason Eval/Treat Not Completed: (P) Other (comment) (pt c/o dizziness when standing earlier. Lengthy discussion on things we could try to modify her discomfort to allow participation in therapies since she hasn't been able to ambulate with [place] since admission more than pivotal transfers but pt defers.) Pt states BP has been stable seated/standing and defers orthostatic BP assessment, no visible nystagmus while seated. PTA asked if she thought anxiety was a component of her dizziness when she had these symptoms earlier in the day and she states it was not. Will continue efforts next date per PT plan of care as schedule permits.   Katielynn Horan M Blondina Coderre 08/26/2023, 1:40 PM

## 2023-08-26 NOTE — Assessment & Plan Note (Signed)
Pt has history of chronic liver disease and autoimmune hepatitis. On Myfortic and prednisone, nadolol, and lactulose, continue home meds - Lactulose 10 g daily PRN

## 2023-08-26 NOTE — TOC Benefit Eligibility Note (Signed)
Patient Product/process development scientist completed.    The patient is insured through [facility]. Patient has Medicare and is not eligible for a copay card, but may be able to apply for patient assistance, if available.    Ran test claim for Jardiance 10 mg and the current 30 day co-pay is $0.00.   This test claim was processed through [facility]- copay amounts may vary at other pharmacies due to pharmacy/plan contracts, or as the patient moves through the different stages of their insurance plan.     Roland Earl, CPHT Pharmacy Technician III Certified Patient Advocate [facility] Patient Advocate Team Direct Number: [phone]  Fax: [phone]

## 2023-08-26 NOTE — Care Management Important Message (Signed)
Important Message  Patient Details  Name: Monica Ellis MRN: [id number] Date of Birth: 1961/04/28   Medicare Important Message Given:  Yes     Tiani Stanbery 08/26/2023, 3:00 PM

## 2023-08-26 NOTE — Assessment & Plan Note (Addendum)
Identified during echocardiogram on 9/19. Per cardiology recommendations, Pt is to start on a couple of therapies. Pt was offered the opportunity but did not want to start these therapies today.  - SGLT2- jiardiance 10 mg daily (pt refused on 9/20, reassess 9/21) - Spironolactone - 12.5 mg daily (pt refused on 9/20, reassess 9/21)

## 2023-08-26 NOTE — Progress Notes (Signed)
Notified provider patient refusing jardiance and aldactone as well as having cardiac monitor placed. Dr. Sherrilee Gilles responded it was okay to hold those today and start tomorrow.

## 2023-08-26 NOTE — Progress Notes (Signed)
NAME:  Monica Ellis, MRN:  [id number], DOB:  10/23/1961, LOS: 3 ADMISSION DATE:  08/23/2023, CONSULTATION DATE:  08/26/2023 REFERRING MD:  Westley Chandler, MD, CHIEF COMPLAINT:  hypoxemia   History of Present Illness:  The patient is a 62 year old woman with a complicated past medical history.  Notably she was diagnosed with lupus, autoimmune hepatitis, and is currently on prednisone and Myfortic.  She was seen in pulmonary clinic at [facility] and PFTs showed restriction to ventilation with reduced diffusion capacity.  Her CT scan at that time was without fibrotic lung disease, and there was concern for possible portal pulmonary hypertension.  However she had an echocardiogram that seem to rule out hepatopulmonary syndrome.  Her pulmonologist there felt that her symptoms are most likely secondary to obesity hypoventilation syndrome and she was started on a trilogy vent.  However she was not able to tolerate this for very long and there was some trouble affording the device.  She was recommended undergo pulmonary rehab and weight loss with nocturnal vent therapy.  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  Furthermore she was already on Myfortic and prednisone which would treat shrinking lung syndrome.  She is referred to pulmonary rehab but says she did rehab at home instead.  She says that 6 months ago she actually weaned herself off of all her oxygen therapy.  However the course of the last several weeks she has had worsening shortness of breath and came to the ED because she felt she needed more than her baseline 3 L nasal cannula.  She says she has pain in all her joints all the time secondary to her lupus although she has been on prednisone for many many years.  She denies lower extremity edema, abdominal swelling, orthopnea.  She thinks she actually sleeps quite well.    Pertinent  Medical History  SLE Hypertension Diabetes Autoimmune  hepatitis with resultant cirrhosis Chronic respiratory failure  Significant Hospital Events: Including procedures, antibiotic start and stop dates in addition to other pertinent events     Interim History / Subjective:  Reports feeling better  Objective   Blood pressure 135/64, pulse (!) 52, temperature 97.7 F (36.5 C), temperature source Oral, resp. rate (!) 22, SpO2 99%.    FiO2 (%):  [44 %] 44 %   Intake/Output Summary (Last 24 hours) at 08/26/2023 0913 Last data filed at 08/26/2023 0810 Gross per 24 hour  Intake 430 ml  Output 2 ml  Net 428 ml   There were no vitals filed for this visit.  Examination: Reports having slept better tolerated CPAP for approximately 3 hours No JVD lymphadenopathy is appreciated Decreased breath sounds throughout Heart sounds are distant Abdomen is obese soft nontender Lower extremities with mild edema Neurologically intact      Resolved Hospital Problem list     Assessment & Plan:  Acute on chronic hypoxemic respiratory failure Suspicion for chronic hypercapnia Suspected untreated OSA with OHS COPD with ongoing tobacco use disorder SLE  Autoimmune hepatitis with compensated cirrhosis - currently on myfortic  Physical deconditioning  Continue oxygen as needed CPAP as tolerated Weight loss Smoking cessation Continue Myfortic Follow-up with [facility]      .  Labs   CBC: Recent Labs  Lab 08/23/23 0618 08/23/23 0649 08/24/23 0834 08/25/23 0508  WBC 10.8*  --  12.3* 13.9*  NEUTROABS 6.7  --   --   --  HGB 12.0 13.3 12.0 12.2  HCT 39.7 39.0 39.2 38.4  MCV 96.1  --  94.9 93.2  PLT 185  --  184 176    Basic Metabolic Panel: Recent Labs  Lab 08/23/23 0618 08/23/23 0649 08/24/23 0834 08/25/23 0507  NA 138 141 133* 134*  K 3.2* 3.3* 4.1 3.5  CL 103  --  98 95*  CO2 29  --  27 31  GLUCOSE 133*  --  419* 93  BUN 14  --  18 23  CREATININE 0.88  --  1.05* 0.92  CALCIUM 8.6*  --  8.7*  8.8*   GFR: Estimated Creatinine Clearance: 90.1 mL/min (by C-G formula based on SCr of 0.92 mg/dL). Recent Labs  Lab 08/23/23 0618 08/24/23 0834 08/25/23 0508  WBC 10.8* 12.3* 13.9*    Liver Function Tests: Recent Labs  Lab 08/23/23 0618 08/24/23 0834 08/25/23 0507  AST 26 19 18   ALT 28 27 26   ALKPHOS 32* 31* 28*  BILITOT 0.8 1.0 1.1  PROT 7.8 8.0 7.8  ALBUMIN 3.1* 3.2* 3.4*   No results for input(s): "LIPASE", "AMYLASE" in the last 168 hours. No results for input(s): "AMMONIA" in the last 168 hours.  ABG    Component Value Date/Time   PHART 7.44 09/04/2022 1213   PCO2ART 46 09/04/2022 1213   PO2ART 41 (L) 09/04/2022 1213   HCO3 31.9 (H) 08/23/2023 0649   TCO2 34 (H) 08/23/2023 0649   O2SAT 99 08/23/2023 0649     Coagulation Profile: No results for input(s): "INR", "PROTIME" in the last 168 hours.  Cardiac Enzymes: No results for input(s): "CKTOTAL", "CKMB", "CKMBINDEX", "TROPONINI" in the last 168 hours.  HbA1C: Hgb A1c MFr Bld  Date/Time Value Ref Range Status  08/23/2023 06:18 AM 6.1 (H) 4.8 - 5.6 % Final    Comment:    (NOTE) Pre diabetes:          5.7%-6.4%  Diabetes:              >6.4%  Glycemic control for   <7.0% adults with diabetes   09/03/2022 12:23 PM 5.6 4.8 - 5.6 % Final    Comment:    (NOTE) Pre diabetes:          5.7%-6.4%  Diabetes:              >6.4%  Glycemic control for   <7.0% adults with diabetes     CBG: Recent Labs  Lab 08/25/23 0816 08/25/23 1230 08/25/23 1644 08/25/23 2027 08/26/23 0733  GLUCAP 136* 173* 145* 171* 161*     Steve Amamda Curbow ACNP Acute Care Nurse Practitioner [facility]/Critical [facility] Please consult Amion 08/26/2023, 9:13 AM

## 2023-08-26 NOTE — Progress Notes (Signed)
   Patient Name: Monica Ellis Date of Encounter: 08/26/2023 [facility] Cardiologist: Reatha Harps, MD   Interval Summary  .    Patient feels better this AM. Reports that she used the cpap for about 4-5 hours last night and was started on bactrim. Was able to wean oxygen from 6 L to 5 L   Vital Signs .    Vitals:   08/25/23 2027 08/25/23 2315 08/26/23 0648 08/26/23 0930  BP: (!) 119/49  135/64 133/74  Pulse: (!) 58 61 (!) 52 64  Resp:  (!) 22  17  Temp: 99.1 F (37.3 C)  97.7 F (36.5 C) 98.5 F (36.9 C)  TempSrc: Oral  Oral Oral  SpO2: 99% 99% 99% 97%    Intake/Output Summary (Last 24 hours) at 08/26/2023 1011 Last data filed at 08/26/2023 0810 Gross per 24 hour  Intake 430 ml  Output 1 ml  Net 429 ml      08/15/2023    3:12 PM 09/03/2022   11:38 AM 08/14/2022   11:56 PM  Last 3 Weights  Weight (lbs) 313 lb 0.9 oz 315 lb 350 lb  Weight (kg) 142 kg 142.883 kg 158.759 kg      Telemetry/ECG    NA  - Personally Reviewed  Physical Exam .   GEN: No acute distress.  Sitting comfortably in the bed wearing [name]  Neck: No JVD Cardiac: RRR, no murmurs, rubs, or gallops. Radial pulses 2+ bilaterally  Respiratory: Clear to auscultation bilaterally. Normal work of breathing  GI: Soft, nontender, non-distended  MS: No edema in BLE   Assessment & Plan .     Acute on chronic hypoxemic respiratory failure  Suspected OSA with OHS  COPD, ongoing tobacco use  Grade 2 DD on echocardiogram  - Patient presented complaining of shortness of breath. BNP 67.7. Cxr without acute cardiopulmonary disease.  - Patient also being seen by pulmonology this admission and has seen multiple pulmonologist's in the past. Has been instructed to participate in pulmonary rehab and undergo sleep study, but she has not  - Echocardiogram this admission showed EF 60-65%, no regional wall motion abnormalities, moderate LVH, grade II DD, normal RV size and function. Normal RV size and lack of  TR argues against RV failure. RA pressure is 8, which is not suggestive of significant volume overload.   - Suspect that patient's breathing issues is primarily OHS, untreated OSA. She does have grade II DD on echocardiogram. Physical exam, lab results, and imaging do not indicate hypervolemia. - I have ordered 1 dose of IV lasix 40 mg to see if diuresis helps improve breathing  - Recommend SGLT2i given diastolic dysfunction  - Considered repeating RHC, but I do not think that it will alter treatment plan at this time.    Otherwise per primary  - Type 2 DM  - SLE  - Nicotine dependence  - Autoimmune hepatitis  - Immunosuppression due to chronic steroid use  - Chtonic pain  - History of CVA  - Depression, anxiety    For questions or updates, please contact [facility] Please consult [email or web address] for contact info under        Signed, Jonita Albee, PA-C

## 2023-08-27 DIAGNOSIS — M329 Systemic lupus erythematosus, unspecified: Secondary | ICD-10-CM | POA: Diagnosis not present

## 2023-08-27 DIAGNOSIS — E1165 Type 2 diabetes mellitus with hyperglycemia: Secondary | ICD-10-CM | POA: Diagnosis not present

## 2023-08-27 DIAGNOSIS — J9621 Acute and chronic respiratory failure with hypoxia: Secondary | ICD-10-CM | POA: Diagnosis not present

## 2023-08-27 DIAGNOSIS — F17210 Nicotine dependence, cigarettes, uncomplicated: Secondary | ICD-10-CM | POA: Diagnosis not present

## 2023-08-27 LAB — CBC
HCT: 41.2 % (ref 36.0–46.0)
Hemoglobin: 13.1 g/dL (ref 12.0–15.0)
MCH: 29.2 pg (ref 26.0–34.0)
MCHC: 31.8 g/dL (ref 30.0–36.0)
MCV: 92 fL (ref 80.0–100.0)
Platelets: 164 10*3/uL (ref 150–400)
RBC: 4.48 MIL/uL (ref 3.87–5.11)
RDW: 14.5 % (ref 11.5–15.5)
WBC: 9.8 10*3/uL (ref 4.0–10.5)
nRBC: 0 % (ref 0.0–0.2)

## 2023-08-27 LAB — BASIC METABOLIC PANEL
Anion gap: 11 (ref 5–15)
BUN: 29 mg/dL — ABNORMAL HIGH (ref 8–23)
CO2: 28 mmol/L (ref 22–32)
Calcium: 9.5 mg/dL (ref 8.9–10.3)
Chloride: 94 mmol/L — ABNORMAL LOW (ref 98–111)
Creatinine, Ser: 0.97 mg/dL (ref 0.44–1.00)
GFR, Estimated: >60 mL/min (ref >60)
Glucose, Bld: 151 mg/dL — ABNORMAL HIGH (ref 70–99)
Potassium: 3.4 mmol/L — ABNORMAL LOW (ref 3.5–5.1)
Sodium: 133 mmol/L — ABNORMAL LOW (ref 135–145)

## 2023-08-27 LAB — GLUCOSE, CAPILLARY
Glucose-Capillary: 146 mg/dL — ABNORMAL HIGH (ref 70–99)
Glucose-Capillary: 197 mg/dL — ABNORMAL HIGH (ref 70–99)
Glucose-Capillary: 193 mg/dL — ABNORMAL HIGH (ref 70–99)
Glucose-Capillary: 120 mg/dL — ABNORMAL HIGH (ref 70–99)

## 2023-08-27 MED ORDER — FUROSEMIDE 10 MG/ML IJ SOLN
40.0000 mg | Freq: Once | INTRAMUSCULAR | Status: AC
  Administered 2023-08-27: 40 mg via INTRAVENOUS
  Filled 2023-08-27: qty 4

## 2023-08-27 MED ORDER — SENNOSIDES-DOCUSATE SODIUM 8.6-50 MG PO TABS
1.0000 | ORAL_TABLET | Freq: Every day | ORAL | Status: DC
  Administered 2023-08-27 – 2023-08-28 (×2): 1 via ORAL
  Filled 2023-08-27 (×3): qty 1

## 2023-08-27 MED ORDER — POTASSIUM CHLORIDE 20 MEQ PO PACK
40.0000 meq | PACK | Freq: Once | ORAL | Status: AC
  Administered 2023-08-27: 40 meq via ORAL
  Filled 2023-08-27: qty 2

## 2023-08-27 MED ORDER — INSULIN ASPART PROT & ASPART (70-30 MIX) 100 UNIT/ML ~~LOC~~ SUSP
40.0000 [IU] | Freq: Three times a day (TID) | SUBCUTANEOUS | Status: DC
  Administered 2023-08-27 – 2023-08-29 (×5): 40 [IU] via SUBCUTANEOUS

## 2023-08-27 MED ORDER — ONDANSETRON HCL 4 MG/2ML IJ SOLN
4.0000 mg | Freq: Once | INTRAMUSCULAR | Status: AC
  Administered 2023-08-27: 4 mg via INTRAVENOUS
  Filled 2023-08-27: qty 2

## 2023-08-27 MED ORDER — ONDANSETRON 4 MG PO TBDP
4.0000 mg | ORAL_TABLET | Freq: Three times a day (TID) | ORAL | Status: DC | PRN
  Administered 2023-08-27 – 2023-08-28 (×4): 4 mg via ORAL
  Filled 2023-08-27 (×4): qty 1

## 2023-08-27 MED ORDER — OXYCODONE HCL 5 MG PO TABS
10.0000 mg | ORAL_TABLET | ORAL | Status: DC | PRN
  Administered 2023-08-27 – 2023-08-29 (×7): 10 mg via ORAL
  Filled 2023-08-27 (×7): qty 2

## 2023-08-27 MED ORDER — POLYETHYLENE GLYCOL 3350 17 G PO PACK
17.0000 g | PACK | Freq: Every day | ORAL | Status: DC
  Administered 2023-08-27 – 2023-08-28 (×2): 17 g via ORAL
  Filled 2023-08-27 (×3): qty 1

## 2023-08-27 NOTE — Assessment & Plan Note (Signed)
30 units 70/30 3 times daily, CBGs within range.  Monitor CBGs while inpatient.

## 2023-08-27 NOTE — Progress Notes (Addendum)
Occupational Therapy Treatment Patient Details Name: Monica Ellis MRN: [id number] DOB: [birth year] Today's Date: 08/27/2023   History of present illness Pt is a 62 y/o F admitted on 08/23/23 with c/o SOB. s/p Echo found to have Grade II diastolic dysfunction. Plan nasal CPAP trial for OSA. PMH:  lupus, chronic respiratory failure, obesity hypoventilation syndrome, chronic diastolic CHF, DM2.   OT comments  Pt tolerating 4L well while completing seated UB exercises (see below). Educated pt on energy conservation strategies to improve activity tolerance during functional tasks, handout provided, recommending BSC at this time. BSC depends if pt can tolerate gait to ambulate to bathroom. OT to continue to progress pt as able.       If plan is discharge home, recommend the following:  Assistance with cooking/housework;A little help with bathing/dressing/bathroom   Equipment Recommendations  BSC/3in1 (If not able to progress gait beyond 31ft with PT)    Recommendations for Other Services      Precautions / Restrictions Precautions Precautions: Fall Restrictions Weight Bearing Restrictions: No       Mobility Bed Mobility Overal bed mobility: Modified Independent                    Balance Overall balance assessment: Needs assistance Sitting-balance support: No upper extremity supported Sitting balance-Leahy Scale: Normal                 ADL either performed or assessed with clinical judgement   ADL   General ADL Comments: Educated pt on energy conservation strategies and exercises for UB strengthening. Discussed with pt the use of sockaid and reacher for reduced energy expenditure if needed      Cognition Arousal: Alert Behavior During Therapy: WFL for tasks assessed/performed Overall Cognitive Status: Within Functional Limits for tasks assessed              Exercises General Exercises - Upper Extremity Shoulder Flexion: AROM, Seated, Theraband, 10  reps, Both, Strengthening Shoulder Horizontal ABduction: AROM, Theraband, Both, Seated, Strengthening Elbow Flexion: AROM, Strengthening, 10 reps, Both, Theraband, Seated Elbow Extension: AROM, Strengthening, Both, Theraband, Seated Chair Push Up: AROM, 5 reps, Seated    Shoulder Instructions       General Comments VSS on 4L, pt resting comfortably on 4L during exercises    Pertinent Vitals/ Pain       Pain Assessment Pain Assessment: Faces Faces Pain Scale: Hurts little more Pain Location: chronic back pain Pain Descriptors / Indicators: Aching Pain Intervention(s): Monitored during session  Home Living                                          Prior Functioning/Environment              Frequency  Min 1X/week        Progress Toward Goals  OT Goals(current goals can now be found in the care plan section)  Progress towards OT goals: Progressing toward goals  Acute Rehab OT Goals Patient Stated Goal: to feel better OT Goal Formulation: With patient Time For Goal Achievement: 09/07/23 Potential to Achieve Goals: Good  Plan      Co-evaluation                 AM-PAC OT "6 Clicks" Daily Activity     Outcome Measure   Help from another person eating meals?: None Help from another person taking care  of personal grooming?: A Little Help from another person toileting, which includes using toliet, bedpan, or urinal?: A Little Help from another person bathing (including washing, rinsing, drying)?: A Little Help from another person to put on and taking off regular upper body clothing?: A Little Help from another person to put on and taking off regular lower body clothing?: A Lot 6 Click Score: 18    End of Session Equipment Utilized During Treatment: Oxygen (4L)  OT Visit Diagnosis: Other (comment) (SOB)   Activity Tolerance Patient tolerated treatment well   Patient Left in bed;with call bell/phone within reach   Nurse Communication  Mobility status        Time: 1610-9604 OT Time Calculation (min): 33 min  Charges: OT General Charges $OT Visit: 1 Visit OT Treatments $Therapeutic Activity: 8-22 mins $Therapeutic Exercise: 8-22 mins  08/27/2023  AB, OTR/L  Acute Rehabilitation Services  Office: [phone]   Tristan Schroeder 08/27/2023, 5:53 PM

## 2023-08-27 NOTE — Progress Notes (Signed)
Spoke to patient, on 30U 70/30 TID at present and is concerned about her sugars. Will trial 40U 70/30 TID over the course of the day and increase her to 50U TID if needed tomorrow for glycemic control.   Alfredo Martinez, MD

## 2023-08-27 NOTE — Progress Notes (Signed)
Physical Therapy Treatment Patient Details Name: Monica Ellis MRN: [id number] DOB: Feb 03, 1961 Today's Date: 08/27/2023   History of Present Illness Pt is a 62 y/o F admitted on 08/23/23 with c/o SOB. s/p Echo found to have Grade II diastolic dysfunction. Plan nasal CPAP trial for OSA. PMH:  lupus, chronic respiratory failure, obesity hypoventilation syndrome, chronic diastolic CHF, DM2.    PT Comments  Pt tolerates short bouts of mobility within the room this session. Pt is able to initiate stair training, tolerating ascending/descending 1 step for 2 consecutive trials without break. Pt will benefit from continued stair training and gait training to improve activity tolerance. PT will continue to follow.    If plan is discharge home, recommend the following: A little help with walking and/or transfers;A little help with bathing/dressing/bathroom;Assistance with cooking/housework;Assist for transportation;Help with stairs or ramp for entrance   Can travel by private vehicle        Equipment Recommendations  Rolling walker (2 wheels)    Recommendations for Other Services       Precautions / Restrictions Precautions Precautions: Fall Restrictions Weight Bearing Restrictions: No     Mobility  Bed Mobility               General bed mobility comments: recevied sitting edge of bed    Transfers Overall transfer level: Modified independent Equipment used: None Transfers: Sit to/from Stand, Bed to chair/wheelchair/BSC Sit to Stand: Modified independent (Device/Increase time)   Step pivot transfers: Modified independent (Device/Increase time)       General transfer comment: increased time, UE support of railing or armrest. Pt transfers from bed, couch x 3, and to [facility]    Ambulation/Gait Ambulation/Gait assistance: Supervision Gait Distance (Feet): 5 Feet (5' x 3) Assistive device: None Gait Pattern/deviations: Step-to pattern, Wide base of support Gait velocity:  reduced Gait velocity interpretation: <1.31 ft/sec, indicative of household ambulator   General Gait Details: pt walks for 3 very short bouts from bed to couch and at bedside to navigate during setup for stair training   Stairs Stairs: Yes Stairs assistance: Supervision Stair Management: Sideways, One rail Left, Step to pattern Number of Stairs: 1 General stair comments: pt ascends and descends 1 step twice consecutively   Wheelchair Mobility     Tilt Bed    Modified Rankin (Stroke Patients Only)       Balance Overall balance assessment: Needs assistance Sitting-balance support: No upper extremity supported Sitting balance-Leahy Scale: Normal     Standing balance support: No upper extremity supported Standing balance-Leahy Scale: Fair Standing balance comment: pt maintains static standing for brief periods without UE support                            Cognition Arousal: Alert Behavior During Therapy: WFL for tasks assessed/performed Overall Cognitive Status: Within Functional Limits for tasks assessed                                          Exercises      General Comments General comments (skin integrity, edema, etc.): pt on 6L [place], demonstrates increased work of breathing with short bouts of ambulation and stair training but appears to recover quickly, returning to conversation within ~30 seconds of sitting      Pertinent Vitals/Pain Pain Assessment Pain Assessment: Faces Faces Pain Scale: Hurts little more Pain Location:  chronic back pain Pain Descriptors / Indicators: Aching Pain Intervention(s): Monitored during session    Home Living                          Prior Function            PT Goals (current goals can now be found in the care plan section) Acute Rehab PT Goals Patient Stated Goal: to breathe better Progress towards PT goals: Progressing toward goals (slow progress)    Frequency    Min  1X/week      PT Plan      Co-evaluation              AM-PAC PT "6 Clicks" Mobility   Outcome Measure  Help needed turning from your back to your side while in a flat bed without using bedrails?: None Help needed moving from lying on your back to sitting on the side of a flat bed without using bedrails?: None Help needed moving to and from a bed to a chair (including a wheelchair)?: None Help needed standing up from a chair using your arms (e.g., wheelchair or bedside chair)?: None Help needed to walk in hospital room?: Total (insufficient distance) Help needed climbing 3-5 steps with a railing? : A Little 6 Click Score: 20    End of Session Equipment Utilized During Treatment: Oxygen Activity Tolerance: Patient tolerated treatment well Patient left: in chair;with call bell/phone within reach (on [facility], nurse tech aware) Nurse Communication: Mobility status PT Visit Diagnosis: Muscle weakness (generalized) (M62.81);Difficulty in walking, not elsewhere classified (R26.2);Other abnormalities of gait and mobility (R26.89);Unsteadiness on feet (R26.81)     Time: 5366-4403 PT Time Calculation (min) (ACUTE ONLY): 17 min  Charges:    $Gait Training: 8-22 mins PT General Charges $$ ACUTE PT VISIT: 1 Visit                     Arlyss Gandy, PT, DPT Acute Rehabilitation Office [phone]    Arlyss Gandy 08/27/2023, 2:15 PM

## 2023-08-27 NOTE — Assessment & Plan Note (Signed)
Continue home medications. - Prednisone 10 mg daily - Mycophenolate 1080 daily

## 2023-08-27 NOTE — Progress Notes (Signed)
Daily Progress Note Intern Pager: [phone]  Patient name: Monica Ellis Medical record number: [id number] Date of birth: 1961-05-10 Age: 62 y.o. 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  She feels as if she may be able to wean to 4 L today.  It appears that the primary differential would be chronic hypercapnia in the setting of untreated OSA/OHS.  We have pulmonary following the patient while inpatient and she follows with [facility] pulmonary outpatient.  Will try to follow-up with pulm, has TOC consult for assistance in obtaining CPAP for her to go home with.  Per pulmonology note today, they have signed off, appreciate their assistance.  She did have a dose of IV Lasix 40 yesterday for assistance with diuresis as possible contributor to her symptoms.  Poorly documented ins and outs and no daily weights noted.  Patient is very difficult to assess volume status but reports that she feels better.  Consider transition to p.o. diuresis today.  Will encourage the patient to take her spironolactone and Jardiance as this will also assist her with volume.  Cardiology has also signed off, appreciate their assistance.  Reports of nausea with GDMT, ordered Zofran as needed for nausea, QTc within range -Encouraged use of spironolactone and Jardiance -  Needs CPAP for home use - Consider p.o. diuresis although difficult to assess volume status - Strict ins and outs - Daily weights Diabetes mellitus, type 2 (HCC) 30 units 70/30 3 times daily, CBGs within range.  Monitor CBGs while inpatient. SLE (systemic lupus erythematosus related syndrome) (HCC) Continue home medications - Prednisone 10 mg daily - Mycophenolate 1080 daily Nicotine dependence, cigarettes, uncomplicated Pt smokes 1/2 pack per day. - Nicotine patches  Chronic liver disease Pt has history of chronic liver disease and autoimmune hepatitis. On Myfortic and prednisone, nadolol, and lactulose, continue home meds - Lactulose 10 g daily PRN -Nadolol at decreased dosage Immunosuppression due to chronic steroid use (HCC) Pt is on multiple immunosuppressive therapies for her Lupus. Prednisone and mycophenolate. Pt has had PJP prophylaxis via Bactrim SS in the past. Pt is agreeable to starting prophylactic therapy, mentioned that she had stopped this in July.  - Bactrim 400-80 mg daily Grade II diastolic dysfunction Identified during echocardiogram on 9/19. Per cardiology recommendations, trial IV diuresis that seem to help the patient although unable to assess volume status.  Continue with Jardiance and spironolactone. Follow up with cardiology outpatient    Chronic and Stable Problems:  -Chronic pain-OxyContin 12-hour tabs 10 mg every 6 as needed - Hypertension-Home amlodipine 10 mg daily, nadolol decreased as mentioned above - History of CVA-aspirin/Plavix???  Unsure if we have determined why she is on DAPT therapy   FEN/GI: Regular diet PPx: Lovenox for prophylaxis Dispo:Home with home health tomorrow. Barriers include continued diuresis and need to obtain nasal CPAP for the patient for her to  go home with, will follow-up with case management.   Subjective:  Patient reports that she is doing well.  She notes that she has nausea with the spironolactone and Jardiance but is  willing to try it if she has antiemetics.  She also notes that she has had harder bowel movements and would like something to assist with that   Objective: Temp:  [97.8 F (36.6 C)-98.5 F (36.9 C)] 97.8 F (36.6 C) (09/21 0347) Pulse Rate:  [52-64] 57 (09/21 0347) Resp:  [16-19] 16 (09/21 0114) BP: (133-148)/(74-81) 147/81 (09/21 0347) SpO2:  [94 %-98 %] 95 % (09/21 0347) FiO2 (%):  [40 %] 40 % (09/21 0114) General: Alert and oriented in no apparent distress, sitting up at bedside, pleasant Heart: Regular rate and rhythm with no murmurs appreciated Lungs: CTA bilaterally, no wheezing, difficult to assess given body habitus Abdomen: no abdominal pain Skin: Warm and dry Extremities: No lower extremity edema   Laboratory: Most recent CBC Lab Results  Component Value Date   WBC 9.8 08/27/2023   HGB 13.1 08/27/2023   HCT 41.2 08/27/2023   MCV 92.0 08/27/2023   PLT 164 08/27/2023   Most recent BMP    Latest Ref Rng & Units 08/27/2023    6:04 AM  BMP  Glucose 70 - 99 mg/dL 161   BUN 8 - 23 mg/dL 29   Creatinine 0.96 - 1.00 mg/dL 0.45   Sodium 409 - 811 mmol/L 133   Potassium 3.5 - 5.1 mmol/L 3.4   Chloride 98 - 111 mmol/L 94   CO2 22 - 32 mmol/L 28   Calcium 8.9 - 10.3 mg/dL 9.5      Alfredo Martinez, MD 08/27/2023, 9:28 AM  PGY-3, [facility] FPTS Intern pager: [phone], text pages welcome Secure chat group [facility] Teaching Service

## 2023-08-27 NOTE — Assessment & Plan Note (Signed)
Pt is on multiple immunosuppressive therapies for her Lupus. Prednisone and mycophenolate. Pt has had PJP prophylaxis via Bactrim SS in the past. Pt is agreeable to starting prophylactic therapy, mentioned that she had stopped this in July.  - Bactrim 400-80 mg daily

## 2023-08-27 NOTE — Assessment & Plan Note (Signed)
Pt has history of chronic liver disease and autoimmune hepatitis. On Myfortic and prednisone, nadolol, and lactulose, continue home meds - Lactulose 10 g daily PRN -Nadolol at decreased dosage

## 2023-08-27 NOTE — Progress Notes (Signed)
RT checked on patient to inquire about CPAP use, patient asleep no snoring noted. RT will check back with patient.

## 2023-08-27 NOTE — Assessment & Plan Note (Signed)
Currently on 5 L nasal cannula, patient is very involved in her care so continue with shared decision making on weaning.  She feels as if she may be able to wean to 4 L today.  It appears that the primary differential would be chronic hypercapnia in the setting of untreated OSA/OHS.  We have pulmonary following the patient while inpatient and she follows with [facility] pulmonary outpatient.  Will try to follow-up with pulm, has TOC consult for assistance in obtaining CPAP for her to go home with.  Per pulmonology note today, they have signed off, appreciate their assistance.  She did have a dose of IV Lasix 40 yesterday for assistance with diuresis as possible contributor to her symptoms.  Poorly documented ins and outs and no daily weights noted.  Patient is very difficult to assess volume status but reports that she feels better.  Consider transition to p.o. diuresis today.  Will encourage the patient to take her spironolactone and Jardiance as this will also assist her with volume.  Cardiology has also signed off, appreciate their assistance.  Reports of nausea with GDMT, ordered Zofran as needed for nausea, QTc within range -Encouraged use of spironolactone and Jardiance - Needs CPAP for home use - Consider p.o. diuresis although difficult to assess volume status - Strict ins and outs - Daily weights

## 2023-08-27 NOTE — Assessment & Plan Note (Signed)
Pt smokes 1/2 pack per day. - Nicotine patches

## 2023-08-27 NOTE — Assessment & Plan Note (Signed)
Identified during echocardiogram on 9/19. Per cardiology recommendations, trial IV diuresis that seem to help the patient although unable to assess volume status.  Continue with Jardiance and spironolactone. Follow up with cardiology outpatient

## 2023-08-27 NOTE — Progress Notes (Addendum)
NAME:  Monica Ellis, MRN:  [id number], DOB:  November 22, 1961, LOS: 4 ADMISSION DATE:  08/23/2023, CONSULTATION DATE:  08/27/2023 REFERRING MD:  Westley Chandler, MD, CHIEF COMPLAINT:  hypoxemia   History of Present Illness:  The patient is a 62 year old woman with a complicated past medical history.  Notably she was diagnosed with lupus, autoimmune hepatitis, and is currently on prednisone and Myfortic.  She was seen in pulmonary clinic at [facility] and PFTs showed restriction to ventilation with reduced diffusion capacity.  Her CT scan at that time was without fibrotic lung disease, and there was concern for possible portal pulmonary hypertension.  However she had an echocardiogram that seem to rule out hepatopulmonary syndrome.  Her pulmonologist there felt that her symptoms are most likely secondary to obesity hypoventilation syndrome and she was started on a trilogy vent.  However she was not able to tolerate this for very long and there was some trouble affording the device.  She was recommended undergo pulmonary rehab and weight loss with nocturnal vent therapy.  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  Furthermore she was already on Myfortic and prednisone which would treat shrinking lung syndrome.  She is referred to pulmonary rehab but says she did rehab at home instead.  She says that 6 months ago she actually weaned herself off of all her oxygen therapy.  However the course of the last several weeks she has had worsening shortness of breath and came to the ED because she felt she needed more than her baseline 3 L nasal cannula.  She says she has pain in all her joints all the time secondary to her lupus although she has been on prednisone for many many years.  She denies lower extremity edema, abdominal swelling, orthopnea.  She thinks she actually sleeps quite well.    Pertinent  Medical History  SLE Hypertension Diabetes Autoimmune  hepatitis with resultant cirrhosis Chronic respiratory failure  Significant Hospital Events: Including procedures, antibiotic start and stop dates in addition to other pertinent events   08/23/2023 admission   Interim History / Subjective:  States she feels better. Wore CPAP overnight for about 2-3 hours Oxygen decreased from 5L to 4 L.  Denies any productive cough. Using IS WBC down trending, T Max 98.5 last 24 Net + 2200 cc's   Objective   Blood pressure (!) 147/81, pulse (!) 57, temperature 97.8 F (36.6 C), temperature source Oral, resp. rate 16, SpO2 95%.    FiO2 (%):  [40 %] 40 %   Intake/Output Summary (Last 24 hours) at 08/27/2023 1324 Last data filed at 08/27/2023 0330 Gross per 24 hour  Intake 670 ml  Output 0 ml  Net 670 ml   There were no vitals filed for this visit.  Examination: General:Reports having slept better tolerated CPAP for approximately 3 hours ENT:No JVD lymphadenopathy , MM pink and moist, No LAD Lungs:Bilateral chest excursion, Decreased breath sounds throughout, but clear CV:S1, S2, RRR, No RMG, Heart sounds are distant  MW:NUUVOZD is obese soft non tender, ND, BS +,  Extremities:Lower extremities with mild edema, no obvious deformities Neuro:Neurologically intact, MAE x 4, A&O x 3, appropriate      Resolved Hospital Problem list     Assessment & Plan:  Acute on chronic hypoxemic respiratory failure Suspicion for chronic hypercapnia Suspected untreated OSA with OHS COPD with ongoing tobacco use disorder 30 pack year smoking history, meets criteria for lung  cancer screening SLE  Autoimmune hepatitis with compensated cirrhosis - currently on myfortic  Physical deconditioning  Continue oxygen as needed Wean for oxygen saturations > 92% OOB as tolerated Pulmonary toilet CPAP as tolerated, will need formal diagnosis of OSA for new CPAP machine , see if [facility] can manage as she is established with them. ( She had CPAP 2 years ago,  but was taken away as patient was not compliant) Encourage Weight loss>> consider OP referral to healthy weight and wellness Encourage Smoking cessation, I have made  referral to Lung Cancer screening>> patient would like to participate in program.  Continue Myfortic Follow-up with [facility] Pulmonology  As patient is improving, PCCM will sign off. Please call again if we are needed. Thank you      .  Labs   CBC: Recent Labs  Lab 08/23/23 0618 08/23/23 0649 08/24/23 0834 08/25/23 0508 08/26/23 0853 08/27/23 0604  WBC 10.8*  --  12.3* 13.9* 10.4 9.8  NEUTROABS 6.7  --   --   --   --   --   HGB 12.0 13.3 12.0 12.2 13.0 13.1  HCT 39.7 39.0 39.2 38.4 41.3 41.2  MCV 96.1  --  94.9 93.2 93.7 92.0  PLT 185  --  184 176 196 164    Basic Metabolic Panel: Recent Labs  Lab 08/23/23 0618 08/23/23 0649 08/24/23 0834 08/25/23 0507 08/26/23 0853 08/27/23 0604  NA 138 141 133* 134* 135 133*  K 3.2* 3.3* 4.1 3.5 3.6 3.4*  CL 103  --  98 95* 96* 94*  CO2 29  --  27 31 31 28   GLUCOSE 133*  --  419* 93 181* 151*  BUN 14  --  18 23 26* 29*  CREATININE 0.88  --  1.05* 0.92 1.06* 0.97  CALCIUM 8.6*  --  8.7* 8.8* 9.3 9.5  MG  --   --   --   --  1.7  --    GFR: Estimated Creatinine Clearance: 85.5 mL/min (by C-G formula based on SCr of 0.97 mg/dL). Recent Labs  Lab 08/24/23 0834 08/25/23 0508 08/26/23 0853 08/27/23 0604  WBC 12.3* 13.9* 10.4 9.8    Liver Function Tests: Recent Labs  Lab 08/23/23 0618 08/24/23 0834 08/25/23 0507  AST 26 19 18   ALT 28 27 26   ALKPHOS 32* 31* 28*  BILITOT 0.8 1.0 1.1  PROT 7.8 8.0 7.8  ALBUMIN 3.1* 3.2* 3.4*   No results for input(s): "LIPASE", "AMYLASE" in the last 168 hours. No results for input(s): "AMMONIA" in the last 168 hours.  ABG    Component Value Date/Time   PHART 7.44 09/04/2022 1213   PCO2ART 46 09/04/2022 1213   PO2ART 41 (L) 09/04/2022 1213   HCO3 31.9 (H) 08/23/2023 0649   TCO2 34 (H)  08/23/2023 0649   O2SAT 99 08/23/2023 0649     Coagulation Profile: No results for input(s): "INR", "PROTIME" in the last 168 hours.  Cardiac Enzymes: No results for input(s): "CKTOTAL", "CKMB", "CKMBINDEX", "TROPONINI" in the last 168 hours.  HbA1C: Hgb A1c MFr Bld  Date/Time Value Ref Range Status  08/23/2023 06:18 AM 6.1 (H) 4.8 - 5.6 % Final    Comment:    (NOTE) Pre diabetes:          5.7%-6.4%  Diabetes:              >6.4%  Glycemic control for   <7.0% adults with diabetes   09/03/2022 12:23 PM 5.6  4.8 - 5.6 % Final    Comment:    (NOTE) Pre diabetes:          5.7%-6.4%  Diabetes:              >6.4%  Glycemic control for   <7.0% adults with diabetes     CBG: Recent Labs  Lab 08/26/23 0733 08/26/23 1128 08/26/23 1623 08/26/23 2102 08/27/23 0720  GLUCAP 161* 181* 188* 166* 146*     Bevelyn Ngo, MSN, AGACNP-BC [facility] See Amion for personal pager PCCM on call pager [phone]  08/27/2023, 8:08 AM

## 2023-08-28 DIAGNOSIS — J9621 Acute and chronic respiratory failure with hypoxia: Secondary | ICD-10-CM | POA: Diagnosis not present

## 2023-08-28 LAB — GLUCOSE, CAPILLARY
Glucose-Capillary: 153 mg/dL — ABNORMAL HIGH (ref 70–99)
Glucose-Capillary: 189 mg/dL — ABNORMAL HIGH (ref 70–99)
Glucose-Capillary: 189 mg/dL — ABNORMAL HIGH (ref 70–99)
Glucose-Capillary: 211 mg/dL — ABNORMAL HIGH (ref 70–99)
Glucose-Capillary: 192 mg/dL — ABNORMAL HIGH (ref 70–99)
Glucose-Capillary: 232 mg/dL — ABNORMAL HIGH (ref 70–99)

## 2023-08-28 LAB — BASIC METABOLIC PANEL
Anion gap: 14 (ref 5–15)
BUN: 30 mg/dL — ABNORMAL HIGH (ref 8–23)
CO2: 28 mmol/L (ref 22–32)
Calcium: 9.6 mg/dL (ref 8.9–10.3)
Chloride: 92 mmol/L — ABNORMAL LOW (ref 98–111)
Creatinine, Ser: 1.05 mg/dL — ABNORMAL HIGH (ref 0.44–1.00)
GFR, Estimated: 59 mL/min — ABNORMAL LOW (ref >60)
Glucose, Bld: 182 mg/dL — ABNORMAL HIGH (ref 70–99)
Potassium: 3.4 mmol/L — ABNORMAL LOW (ref 3.5–5.1)
Sodium: 134 mmol/L — ABNORMAL LOW (ref 135–145)

## 2023-08-28 LAB — CBC
HCT: 41.6 % (ref 36.0–46.0)
Hemoglobin: 13.2 g/dL (ref 12.0–15.0)
MCH: 29.2 pg (ref 26.0–34.0)
MCHC: 31.7 g/dL (ref 30.0–36.0)
MCV: 92 fL (ref 80.0–100.0)
Platelets: 207 10*3/uL (ref 150–400)
RBC: 4.52 MIL/uL (ref 3.87–5.11)
RDW: 14.5 % (ref 11.5–15.5)
WBC: 12.4 10*3/uL — ABNORMAL HIGH (ref 4.0–10.5)
nRBC: 0 % (ref 0.0–0.2)

## 2023-08-28 MED ORDER — POTASSIUM CHLORIDE CRYS ER 20 MEQ PO TBCR
40.0000 meq | EXTENDED_RELEASE_TABLET | Freq: Once | ORAL | Status: AC
  Administered 2023-08-28: 40 meq via ORAL
  Filled 2023-08-28: qty 2

## 2023-08-28 MED ORDER — LACTULOSE 10 GM/15ML PO SOLN
30.0000 g | Freq: Every day | ORAL | Status: DC
  Administered 2023-08-28: 30 g via ORAL
  Filled 2023-08-28 (×2): qty 45

## 2023-08-28 MED ORDER — FUROSEMIDE 40 MG PO TABS
80.0000 mg | ORAL_TABLET | Freq: Every day | ORAL | Status: DC
  Administered 2023-08-28: 80 mg via ORAL
  Filled 2023-08-28: qty 2

## 2023-08-28 NOTE — Assessment & Plan Note (Addendum)
CBGs overnight: 120 > 153 > 189. Patient okay with continuing 40 units TID regimen.  - Continue 40 units 70/30 TID - CTM CBGs while inpatient

## 2023-08-28 NOTE — Assessment & Plan Note (Addendum)
Identified during echocardiogram on 9/19. Trial IV lasix 40 diuresis seemed to help the patient symptomatically.  - Continue with Jardiance and spironolactone - Consider transition to p.o. diuresis today, pending kidney function this AM.   - Follow up with cardiology outpatient.

## 2023-08-28 NOTE — Plan of Care (Signed)
Problem: Education: Goal: Ability to describe self-care measures that may prevent or decrease complications (Diabetes Survival Skills Education) will improve Outcome: Completed/Met

## 2023-08-28 NOTE — Assessment & Plan Note (Addendum)
Patient is on multiple immunosuppressive therapies for her Lupus including prednisone and mycophenolate. Patient has had PJP prophylaxis via Bactrim SS in the past. Patient is agreeable to starting prophylactic therapy, mentioned that she had stopped this in July.  - Bactrim 400-80 mg daily. Today is day 4.

## 2023-08-28 NOTE — Assessment & Plan Note (Addendum)
Currently on 4 L nasal cannula, patient is very involved in her care so continue with shared decision making on weaning. It appears that the primary differential would be chronic hypercapnia in the setting of untreated OSA/OHS.  Pulmonary and cardiology consulted while inpatient, follows with [facility] pulmonary outpatient. TOC unfortunately unable to assist with CPAP acquisition while inpatient, will have to be done outpatient. She did have a dose of IV Lasix 40 on 9/20 for assistance with diuresis as possible contributor to her symptoms.  Poorly documented ins and outs and no daily weights noted. Patient appears relatively euvolemic on exam today. -- Encouraged continued use of spironolactone and Jardiance -- Consider transition to p.o. diuresis today, pending kidney function this AM.   - Needs CPAP for home use - TOC unable to assist while inpatient.  - Consider p.o. diuresis although difficult to assess volume status - Strict ins and outs - Daily weights

## 2023-08-28 NOTE — Assessment & Plan Note (Addendum)
Continue home medications. - Prednisone 10 mg daily - Mycophenolate 1080 daily

## 2023-08-28 NOTE — Plan of Care (Signed)
Problem: Education: Goal: Ability to describe self-care measures that may prevent or decrease complications (Diabetes Survival Skills Education) will improve Outcome: Progressing Goal: Individualized Educational Video(s) Outcome: Progressing   Problem: Coping: Goal: Ability to adjust to condition or change in health will improve Outcome: Progressing   Problem: Fluid Volume: Goal: Ability to maintain a balanced intake and output will improve Outcome: Progressing   Problem: Health Behavior/Discharge Planning: Goal: Ability to identify and utilize available resources and services will improve Outcome: Progressing Goal: Ability to manage health-related needs will improve Outcome: Progressing   Problem: Metabolic: Goal: Ability to maintain appropriate glucose levels will improve Outcome: Progressing   Problem: Nutritional: Goal: Maintenance of adequate nutrition will improve Outcome: Progressing Goal: Progress toward achieving an optimal weight will improve Outcome: Progressing   Problem: Skin Integrity: Goal: Risk for impaired skin integrity will decrease Outcome: Progressing   Problem: Tissue Perfusion: Goal: Adequacy of tissue perfusion will improve Outcome: Progressing   Problem: Health Behavior/Discharge Planning: Goal: Ability to manage health-related needs will improve Outcome: Progressing   Problem: Clinical Measurements: Goal: Ability to maintain clinical measurements within normal limits will improve Outcome: Progressing Goal: Will remain free from infection Outcome: Progressing Goal: Diagnostic test results will improve Outcome: Progressing Goal: Respiratory complications will improve Outcome: Progressing Goal: Cardiovascular complication will be avoided Outcome: Progressing   Problem: Activity: Goal: Risk for activity intolerance will decrease Outcome: Progressing   Problem: Nutrition: Goal: Adequate nutrition will be maintained Outcome:  Progressing   Problem: Coping: Goal: Level of anxiety will decrease Outcome: Progressing   Problem: Elimination: Goal: Will not experience complications related to bowel motility Outcome: Progressing Goal: Will not experience complications related to urinary retention Outcome: Progressing   Problem: Pain Managment: Goal: General experience of comfort will improve Outcome: Progressing   Problem: Safety: Goal: Ability to remain free from injury will improve Outcome: Progressing   Problem: Skin Integrity: Goal: Risk for impaired skin integrity will decrease Outcome: Progressing

## 2023-08-28 NOTE — Progress Notes (Signed)
Daily Progress Note Intern Pager: [phone]  Patient name: Shawnte Rogel Medical record number: [id number] Date of birth: [birth year] Age: 62 y.o. Gender: female  Primary Care Provider: Annita Brod, MD Consultants: Cardiology, Pulmonology  Code Status: DNR but may intubate - see Code Status in chart   Pt Overview and Major Events to Date:  08/23/2023: Admitted to FMTS  Assessment and Plan:  Sony Vancourt is a 62 year old woman with a history of lupus on chronic prednisone, HTN, CVA, T2DM, & chronic pain who initially presented for acute on chronic dyspnea requiring increased O2 requirement. While admitted, patient has had hyperglycemia requiring increases in insulin regimen, now on 40U TID 70/30.  Assessment & Plan Acute on chronic respiratory failure with hypoxemia (HCC) Currently on 4 L nasal cannula, patient is very involved in her care so continue with shared decision making on weaning. It appears that the primary differential would be chronic hypercapnia in the setting of untreated OSA/OHS.  Pulmonary and cardiology consulted while inpatient, follows with [facility] pulmonary outpatient. TOC unfortunately unable to assist with CPAP acquisition while inpatient, will have to be done outpatient. She did have a dose of IV Lasix 40 on 9/20 for assistance with diuresis as possible contributor to her symptoms.  Poorly documented ins and outs and no daily weights noted. Patient appears relatively euvolemic on exam today. -- Encouraged continued use of spironolactone and Jardiance -- Consider transition to p.o. diuresis today, pending kidney function this AM.   - Needs CPAP for home use - TOC unable to assist while inpatient.  - Consider p.o. diuresis although difficult to assess volume status - Strict ins and outs - Daily weights Diabetes mellitus, type 2 (HCC) CBGs overnight: 120 > 153 > 189. Patient okay with continuing 40 units TID regimen.  - Continue 40 units 70/30 TID -  CTM CBGs while inpatient SLE (systemic lupus erythematosus related syndrome) (HCC) Continue home medications. - Prednisone 10 mg daily - Mycophenolate 1080 daily Chronic liver disease Patient has history of chronic liver disease and autoimmune hepatitis. On Myfortic and prednisone, nadolol, and lactulose, continue home meds. - Increase Lactulose to 30 g daily  - Nadolol at decreased dosage 20mg  BID Immunosuppression due to chronic steroid use (HCC) Patient is on multiple immunosuppressive therapies for her Lupus including prednisone and mycophenolate. Patient has had PJP prophylaxis via Bactrim SS in the past. Patient is agreeable to starting prophylactic therapy, mentioned that she had stopped this in July.  - Bactrim 400-80 mg daily. Today is day 4.  Grade II diastolic dysfunction Identified during echocardiogram on 9/19. Trial IV lasix 40 diuresis seemed to help the patient symptomatically.  - Continue with Jardiance and spironolactone - Consider transition to p.o. diuresis today, pending kidney function this AM.   - Follow up with cardiology outpatient.  Nicotine dependence, cigarettes, uncomplicated Patient smokes 1/2 pack per day. - Nicotine patches   Chronic and Stable Issues: - Chronic pain: Oxycodone 10 q4 PRN - Hypertension: home amlodipine 10 mg daily, decreased nadolol 20 BID - History of CVA: Aspirin 81, Plavix 75  FEN/GI: Regular PPx: Lovenox Dispo: Home with home health, needs CPAP outpatient    Subjective:  Patient doing well this morning and feeling ready to go home soon. Patient feels breathing has improved, no CP, no pain with breathing. Eating and drinking without issue. No N/V/D. She noticed a bit of swelling around her ankles but overall improvement.   Objective: Temp:  [97.9 F (36.6 C)-98.6 F (37 C)] 98.2  F (36.8 C) (09/22 0415) Pulse Rate:  [51-63] 51 (09/22 0415) Resp:  [16-18] 16 (09/22 0415) BP: (136-155)/(53-87) 146/87 (09/22 0415) SpO2:  [98  %-99 %] 99 % (09/22 0415) FiO2 (%):  [40 %] 40 % (09/21 2345) Physical Exam: General: Well-appearing, Brownlee in place.  Cardiovascular: Normal S1/S2. No extra heart sounds. Warm and well-perfused. Respiratory: On 4L . Diminished but clear breath sounds throughout. No increased WOB. Abdomen: Soft, non-tender, non-distended. Extremities: Warm, dry. Trace edema around ankles bilaterally.   Laboratory: Most recent CBC Lab Results  Component Value Date   WBC 9.8 08/27/2023   HGB 13.1 08/27/2023   HCT 41.2 08/27/2023   MCV 92.0 08/27/2023   PLT 164 08/27/2023   Most recent BMP    Latest Ref Rng & Units 08/27/2023    6:04 AM  BMP  Glucose 70 - 99 mg/dL 160   BUN 8 - 23 mg/dL 29   Creatinine 1.09 - 1.00 mg/dL 3.23   Sodium 557 - 322 mmol/L 133   Potassium 3.5 - 5.1 mmol/L 3.4   Chloride 98 - 111 mmol/L 94   CO2 22 - 32 mmol/L 28   Calcium 8.9 - 10.3 mg/dL 9.5     Ivery Quale, MD 08/28/2023, 7:46 AM  PGY-1, [facility] FPTS Intern pager: [phone], text pages welcome Secure chat group [facility] Teaching Service

## 2023-08-28 NOTE — Assessment & Plan Note (Addendum)
Patient smokes 1/2 pack per day. - Nicotine patches

## 2023-08-28 NOTE — Assessment & Plan Note (Addendum)
Patient has history of chronic liver disease and autoimmune hepatitis. On Myfortic and prednisone, nadolol, and lactulose, continue home meds. - Increase Lactulose to 30 g daily  - Nadolol at decreased dosage 20mg  BID

## 2023-08-29 ENCOUNTER — Other Ambulatory Visit (HOSPITAL_COMMUNITY): Payer: Self-pay

## 2023-08-29 DIAGNOSIS — M329 Systemic lupus erythematosus, unspecified: Secondary | ICD-10-CM | POA: Diagnosis not present

## 2023-08-29 DIAGNOSIS — J9621 Acute and chronic respiratory failure with hypoxia: Secondary | ICD-10-CM | POA: Diagnosis not present

## 2023-08-29 DIAGNOSIS — I5033 Acute on chronic diastolic (congestive) heart failure: Secondary | ICD-10-CM

## 2023-08-29 DIAGNOSIS — K769 Liver disease, unspecified: Secondary | ICD-10-CM | POA: Diagnosis not present

## 2023-08-29 LAB — BASIC METABOLIC PANEL
Anion gap: 8 (ref 5–15)
BUN: 29 mg/dL — ABNORMAL HIGH (ref 8–23)
CO2: 33 mmol/L — ABNORMAL HIGH (ref 22–32)
Calcium: 9.2 mg/dL (ref 8.9–10.3)
Chloride: 93 mmol/L — ABNORMAL LOW (ref 98–111)
Creatinine, Ser: 1.24 mg/dL — ABNORMAL HIGH (ref 0.44–1.00)
GFR, Estimated: 49 mL/min — ABNORMAL LOW (ref >60)
Glucose, Bld: 102 mg/dL — ABNORMAL HIGH (ref 70–99)
Potassium: 3.5 mmol/L (ref 3.5–5.1)
Sodium: 134 mmol/L — ABNORMAL LOW (ref 135–145)

## 2023-08-29 LAB — CBC
HCT: 42.1 % (ref 36.0–46.0)
Hemoglobin: 13.2 g/dL (ref 12.0–15.0)
MCH: 29.1 pg (ref 26.0–34.0)
MCHC: 31.4 g/dL (ref 30.0–36.0)
MCV: 92.9 fL (ref 80.0–100.0)
Platelets: 207 10*3/uL (ref 150–400)
RBC: 4.53 MIL/uL (ref 3.87–5.11)
RDW: 14.2 % (ref 11.5–15.5)
WBC: 11.2 10*3/uL — ABNORMAL HIGH (ref 4.0–10.5)
nRBC: 0 % (ref 0.0–0.2)

## 2023-08-29 LAB — GLUCOSE, CAPILLARY
Glucose-Capillary: 125 mg/dL — ABNORMAL HIGH (ref 70–99)
Glucose-Capillary: 169 mg/dL — ABNORMAL HIGH (ref 70–99)

## 2023-08-29 MED ORDER — POTASSIUM CHLORIDE CRYS ER 20 MEQ PO TBCR
20.0000 meq | EXTENDED_RELEASE_TABLET | Freq: Every day | ORAL | 0 refills | Status: AC | PRN
  Filled 2023-08-29: qty 30, 30d supply, fill #0

## 2023-08-29 MED ORDER — ACETAMINOPHEN 325 MG PO TABS: 650.0000 mg | ORAL_TABLET | Freq: Four times a day (QID) | ORAL | Status: AC | PRN

## 2023-08-29 MED ORDER — NADOLOL 20 MG PO TABS
20.0000 mg | ORAL_TABLET | Freq: Two times a day (BID) | ORAL | 0 refills | Status: AC
  Filled 2023-08-29: qty 50, 25d supply, fill #0
  Filled 2023-08-29: qty 10, 5d supply, fill #0
  Filled 2023-08-29: qty 50, 25d supply, fill #0

## 2023-08-29 MED ORDER — FUROSEMIDE 40 MG PO TABS
40.0000 mg | ORAL_TABLET | Freq: Every day | ORAL | 0 refills | Status: AC | PRN
  Filled 2023-08-29: qty 30, 30d supply, fill #0

## 2023-08-29 MED ORDER — SPIRONOLACTONE 25 MG PO TABS
12.5000 mg | ORAL_TABLET | Freq: Every day | ORAL | 0 refills | Status: AC
  Filled 2023-08-29: qty 30, 60d supply, fill #0

## 2023-08-29 MED ORDER — EMPAGLIFLOZIN 10 MG PO TABS
10.0000 mg | ORAL_TABLET | Freq: Every day | ORAL | 0 refills | Status: AC
  Filled 2023-08-29: qty 30, 30d supply, fill #0

## 2023-08-29 MED ORDER — SULFAMETHOXAZOLE-TRIMETHOPRIM 400-80 MG PO TABS
1.0000 | ORAL_TABLET | Freq: Every day | ORAL | 0 refills | Status: AC
  Filled 2023-08-29: qty 30, 30d supply, fill #0

## 2023-08-29 NOTE — Assessment & Plan Note (Deleted)
CBGs overnight: 120 > 153 > 189. Patient okay with continuing 40 units TID regimen.  - Continue 40 units 70/30 TID - CTM CBGs while inpatient

## 2023-08-29 NOTE — Assessment & Plan Note (Deleted)
Patient is on multiple immunosuppressive therapies for her Lupus including prednisone and mycophenolate. Patient has had PJP prophylaxis via Bactrim SS in the past. Patient is agreeable to starting prophylactic therapy, mentioned that she had stopped this in July.  - Bactrim 400-80 mg daily. Today is day 4.

## 2023-08-29 NOTE — Progress Notes (Signed)
DISCHARGE NOTE HOME Monica Ellis to be discharged Home per MD order. Discussed prescriptions and follow up appointments with the patient. Prescriptions given to patient; medication list explained in detail. Patient verbalized understanding.  Skin clean, dry and intact without evidence of skin break down, no evidence of skin tears noted. IV catheter discontinued intact. Site without signs and symptoms of complications. Dressing and pressure applied. Pt denies pain at the site currently. No complaints noted.  Patient free of lines, drains, and wounds.   An After Visit Summary (AVS) was printed and given to the patient. Patient escorted via wheelchair, and discharged home via private auto. Pick up by Junita Push, daughter  Margarita Grizzle, RN

## 2023-08-29 NOTE — Discharge Instructions (Addendum)
Dear Monica Ellis,   Thank you so much for allowing [place] to be part of your care!  You were admitted to [facility] for respiratory distress, which was likely worsened by sleep apnea. You improved with using a CPAP machine. We will try to get you a CPAP machine to use at home.   Your echocardiogram showed Grade 2 diastolic dysfunction, which is a sign of heart failure. We started you on jardiance, spironolactone, lasix to treat this. We also adjusted your Nadolol.   We restarted your Bactrim to prevent PJP infections.   POST-HOSPITAL & CARE INSTRUCTIONS We started you on Bactrim once a day to prevent infections while you are on steroids for lupus Continue taking Jardiance and spironolactone. We decreased your Nadolol dose  Please take Lasix as needed if you notice signs of fluid overload such as leg swelling and unexplained weight gain.  Please take 1 tablet Potassium chloride every time you take the Lasix Please follow-up with your [facility] Pulmonologist. They can continue to manage your breathing symptoms.  Please let PCP/Specialists know of any changes that were made.  Please see medications section of this packet for any medication changes.   DOCTOR'S APPOINTMENT & FOLLOW UP CARE INSTRUCTIONS  Future Appointments  Date Time Provider Department Center  09/19/2023  8:25 AM Jonita Albee, PA-C [facility] None    RETURN PRECAUTIONS: Please seek further medical attention if you: - have difficulty breathing that does not improve with your home oxygen and medications - you start having new chest pain - you feel faint or lose consciousness   Take care and be well!  Family Medicine Teaching Service  [place]  [facility]  [address] [place], [place] [address] [phone]

## 2023-08-29 NOTE — Assessment & Plan Note (Deleted)
Patient smokes 1/2 pack per day. - Nicotine patches

## 2023-08-29 NOTE — Assessment & Plan Note (Deleted)
Patient has history of chronic liver disease and autoimmune hepatitis. On Myfortic and prednisone, nadolol, and lactulose, continue home meds. - Increase Lactulose to 30 g daily  - Nadolol at decreased dosage 20mg  BID

## 2023-08-29 NOTE — Assessment & Plan Note (Deleted)
Identified during echocardiogram on 9/19. Trial IV lasix 40 diuresis seemed to help the patient symptomatically.  - Continue with Jardiance and spironolactone - Consider transition to p.o. diuresis today, pending kidney function this AM.   - Follow up with cardiology outpatient.

## 2023-08-29 NOTE — Assessment & Plan Note (Deleted)
Currently on 4 L nasal cannula, patient is very involved in her care so continue with shared decision making on weaning. It appears that the primary differential would be chronic hypercapnia in the setting of untreated OSA/OHS.  Pulmonary and cardiology consulted while inpatient, follows with [facility] pulmonary outpatient. TOC unfortunately unable to assist with CPAP acquisition while inpatient, will have to be done outpatient. She did have a dose of IV Lasix 40 on 9/20 for assistance with diuresis as possible contributor to her symptoms.  Poorly documented ins and outs and no daily weights noted. Patient appears relatively euvolemic on exam today. -- Encouraged continued use of spironolactone and Jardiance -- Consider transition to p.o. diuresis today, pending kidney function this AM.   - Needs CPAP for home use - TOC unable to assist while inpatient.  - Consider p.o. diuresis although difficult to assess volume status - Strict ins and outs - poorly documented output - Daily weights

## 2023-08-29 NOTE — TOC Transition Note (Signed)
Transition of Care [facility]) - CM/SW Discharge Note   Patient Details  Name: Monica Ellis MRN: [id number] Date of Birth: Jun 08, 1961  Transition of Care [facility]) CM/SW Contact:  Tom-Johnson, Hershal Coria, RN Phone Number: 08/29/2023, 11:54 AM   Clinical Narrative:     Patient is scheduled for discharge today.  Readmission Risk Assessment done. Home health info, Outpatient f/u, hospital f/u and discharge instructions on AVS. RW and BSC recommended, patient declined. CPAP ordered, Sleep Study is required for a CPAP order. Patient has not had Sleep Study done, MD notified.   Prescriptions sent to [facility] pharmacy and meds will be delivered to patient at bedside prior discharge. Daughter, Meggan to transport at discharge.  No further TOC needs noted.        Final next level of care: Home w Home Health Services Barriers to Discharge: Barriers Resolved   Patient Goals and CMS Choice CMS Medicare.gov Compare Post Acute Care list provided to:: Patient Choice offered to / list presented to : Patient  Discharge Placement                  Patient to be transferred to facility by: Daughter Name of family member notified: Meggan    Discharge Plan and Services Additional resources added to the After Visit Summary for                  DME Arranged: N/A (Declined) DME Agency: NA       HH Arranged: PT, OT HH Agency: [facility] Date HH Agency Contacted: 08/24/23 Time HH Agency Contacted: 1640 Representative spoke with at [facility] Agency: Haywood Lasso  Social Determinants of Health (SDOH) Interventions SDOH Screenings   Food Insecurity: No Food Insecurity (08/23/2023)  Housing: High Risk (08/23/2023)  Transportation Needs: No Transportation Needs (08/23/2023)  Utilities: Not At Risk (08/23/2023)  Tobacco Use: High Risk (08/23/2023)     Readmission Risk Interventions    08/24/2023    4:28 PM  Readmission Risk Prevention Plan  PCP or Specialist Appt within 3-5 Days  Complete  HRI or Home Care Consult Complete  Social Work Consult for Recovery Care Planning/Counseling Complete  Palliative Care Screening Not Applicable  Medication Review Oceanographer) Referral to Pharmacy

## 2023-08-29 NOTE — Progress Notes (Signed)
OT Cancellation Note  Patient Details Name: Monica Ellis MRN: [id number] DOB: 04/11/1961   Cancelled Treatment:    Reason Eval/Treat Not Completed: Patient declined, no reason specified (Patient declined OT session stating she was getting discharged and didn't believe she need any more therapy.) Alfonse Flavors, OTA Acute Rehabilitation Services  Office [phone]  Dewain Penning 08/29/2023, 9:03 AM

## 2023-08-29 NOTE — Inpatient Diabetes Management (Signed)
Inpatient Diabetes Program Recommendations  AACE/ADA: New Consensus Statement on Inpatient Glycemic Control (2015)  Target Ranges:  Prepandial:   less than 140 mg/dL      Peak postprandial:   less than 180 mg/dL (1-2 hours)      Critically ill patients:  140 - 180 mg/dL   Lab Results  Component Value Date   GLUCAP 125 (H) 08/29/2023   HGBA1C 6.1 (H) 08/23/2023    Review of Glycemic Control  Latest Reference Range & Units 08/28/23 07:16 08/28/23 11:16 08/28/23 12:47 08/28/23 17:34 08/28/23 20:26 08/29/23 07:27  Glucose-Capillary 70 - 99 mg/dL 782 (H) 956 (H) 213 (H) 192 (H) 232 (H) 125 (H)   Diabetes history: DM 2 Outpatient Diabetes medications:  70/30 120 units breakfast 60 units lunch, 60 units supper Current orders for Inpatient glycemic control:  Jardiance 10 mg Daily 70/30 40 units tid  PO prednisone 10 mg Daily  Inpatient Diabetes Program Recommendations:    -  Increase breakfast and lunchtime 70/30 insulin to 45 units -  Add Novolog 0-9 units tid  Thanks,  Christena Deem RN, MSN, BC-ADM Inpatient Diabetes Coordinator Team Pager [phone] (8a-5p)

## 2023-08-29 NOTE — Assessment & Plan Note (Deleted)
Continue home medications. - Prednisone 10 mg daily - Mycophenolate 1080 daily

## 2023-08-29 NOTE — Discharge Summary (Addendum)
Family Medicine Teaching [facility] Discharge Summary  Patient name: Monica Ellis Medical record number: [id number] Date of birth: 01-13-1961 Age: 62 y.o. Gender: female Date of Admission: 08/23/2023  Date of Discharge: 08/29/23 Admitting Physician: Alfredo Martinez, MD  Primary Care Provider: Annita Brod, MD Consultants: cardiology, pulmonology  Indication for Hospitalization: acute on chronic respiratory failure with hypoxemia  Discharge Diagnoses/Problem List:  Principal Problem for Admission: shortness of breath Other Problems addressed during stay:  Principal Problem:   Acute on chronic respiratory failure with hypoxemia (HCC) Active Problems:   Nicotine dependence, cigarettes, uncomplicated   SLE (systemic lupus erythematosus related syndrome) (HCC)   Diabetes mellitus, type 2 (HCC)   Chronic liver disease   Immunosuppression due to chronic steroid use (HCC)   Grade II diastolic dysfunction   Diastolic dysfunction    Brief Hospital Course:  Hart Wrisley is a 62 y.o.female with a history of SLE, T2DM, chronic liver disease, chronic pain, HTN, CVA, depression who was admitted to the [facility] Medicine Teaching Service [facility] for acute on chronic dyspnea. Her hospital course is detailed below:  Dyspnea Presented initially with acute on chronic respiratory distress.  She was desatting into the high 70%'s on her home monitor and could not get back to her baseline with her normal 3 L O2.  Does not use CPAP or BiPAP at home.  VBG did not show hypercarbia on admission.  CTA negative for PE.  DVT ultrasound lower extremities negative.  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 The patient was started on Bactrim as well for PJP ppx given her history.   Patient required 6 L O2 throughout her admission, was able to be weaned to  4 L O2 on discharge.  G2DD Patient had echo showing new grade 2 diastolic dysfunction.  Diuresed with IV Lasix while admitted, however it is suspected that untreated OSA is the primary driver of her symptoms.  Started spironolactone and Jardiance while inpatient. Discussed taking Lasix daily prn in addition to potassium supplement ONLY on the days when taking the lasix. Cardiology assisted and recommended follow up with them outpatient.    Other chronic conditions were medically managed with home medications and formulary alternatives as necessary (SLE, T2DM, HTN, chronic liver disease, chronic pain, hx CVA, depression)  PCP Follow-up Recommendations: Will need sleep study to get CPAP vs Bipap  1 week post discharge BMP, follow up with PCP F/u pain regimen with oxycodone - consider adjusting this  Follow up with cardiology, evaluate medication adherence as seems to be difficult for patient  Chronic liver dz on CT, follow up with PCP   Disposition: home  Discharge Condition: stable  Discharge Exam:  Vitals:   08/29/23 0547 08/29/23 0917  BP: 138/89 (!) 161/81  Pulse: 63 66  Resp:  18  Temp: 97.7 F (36.5 C) 98.4 F (36.9 C)  SpO2: 97% 95%   Gen: no acute distress, sitting up in bed CV: RRR, no murmurs Pulm: Clear breath sounds throughout, no increased work of breathing on 4L [place] Extremities: no BLE edema  Significant Procedures:  Echo 08/24/23:   1. Left ventricular ejection fraction, by estimation, is 60 to 65%. The  left ventricle has normal function. The left ventricle has no regional  wall motion abnormalities. There is moderate concentric left ventricular  hypertrophy. Left ventricular  diastolic parameters are consistent with  Grade II diastolic dysfunction  (pseudonormalization). Elevated left atrial pressure.   2. Right ventricular systolic function is normal. The right ventricular  size is normal.   3. Left atrial size was mildly dilated.   4. The mitral valve is  normal in structure. No evidence of mitral valve  regurgitation.   5. The aortic valve is normal in structure. Aortic valve regurgitation is  not visualized.   6. The inferior vena cava is normal in size with <50% respiratory  variability, suggesting right atrial pressure of 8 mmHg.   US Doppler BLE 08/24/23: Negative DVT  CTA 08/24/23 IMPRESSION: No evidence of pulmonary emboli.  CT chest w/o  IMPRESSION: Increasing basilar scarring and atelectatic changes compared to the study of October 2023. No consolidation or effusion.   Evidence of chronic liver disease with nodular heterogeneous liver.   Enlargement of the main pulmonary artery. Please correlate for pulmonary artery hypertension.   Slightly diffuse heterogeneous marrow.  Nonspecific.    Significant Labs and Imaging:  Recent Labs  Lab 08/28/23 1219 08/29/23 0531  WBC 12.4* 11.2*  HGB 13.2 13.2  HCT 41.6 42.1  PLT 207 207   Recent Labs  Lab 08/28/23 1219 08/29/23 0531  NA 134* 134*  K 3.4* 3.5  CL 92* 93*  CO2 28 33*  GLUCOSE 182* 102*  BUN 30* 29*  CREATININE 1.05* 1.24*  CALCIUM 9.6 9.2   CXR 08/16/23 IMPRESSION: 1. Mild cardiomegaly with mild central pulmonary vascular congestion. 2. Mild bibasilar linear scarring and/or atelectasis.  CTA Head Neck 08/16/23 IMPRESSION: 1. CTA is negative aside from mild for age atherosclerosis in the head and neck. No hemodynamically significant stenosis. 2. Stable and negative for age CT appearance of the brain. 3.  Aortic Atherosclerosis (ICD10-I70.0).  CXR 08/23/23 IMPRESSION: 1. No radiographic evidence of acute cardiopulmonary disease. 2. Aortic atherosclerosis. 3. Mild cardiomegaly.  CT Chest WO Contrast 08/23/23 IMPRESSION: Increasing basilar scarring and atelectatic changes compared to the study of October 2023. No consolidation or effusion.  Evidence of chronic liver disease with nodular heterogeneous liver.  Enlargement of the main pulmonary  artery. Please correlate for pulmonary artery hypertension.  Slightly diffuse heterogeneous marrow.  Nonspecific.  Aortic Atherosclerosis (ICD10-I70.0).  CTA Chest PE 08/24/23 Negative PE  Results/Tests Pending at Time of Discharge: none  Discharge Medications:  Allergies as of 08/29/2023       Reactions   Ace Inhibitors Swelling   Imuran [azathioprine] Nausea And Vomiting   Severe N/V/D and AKI after starting the med.   Lisinopril Swelling, Other (See Comments)   Other reaction(s): Angioedema (ALLERGY/intolerance), Face   Protonix [pantoprazole] Swelling   Pt states that it causes her lips to swell.    Tramadol Other (See Comments)   Other reaction(s): Mental Status Changes (intolerance) suicidal thoughts   Iodinated Contrast Media Swelling   When patient was in her 20's, swelled up all over after getting getting injection of contrast; did not have any other symptoms/ was given benadryl after that happened and had no further problems per patients/   Iodine Swelling   Omeprazole Swelling   Lips will swell        Medication List     STOP taking these medications    cloNIDine 0.1 MG tablet Commonly known as: CATAPRES   hydrochlorothiazide 12.5 MG tablet Commonly known as: HYDRODIURIL   promethazine 25 MG tablet Commonly known as: PHENERGAN       TAKE these medications    acetaminophen 325 MG tablet Commonly known  as: TYLENOL Take 2 tablets (650 mg total) by mouth every 6 (six) hours as needed for mild pain (or Fever >/= 101).   amLODipine 10 MG tablet Commonly known as: NORVASC Take 10 mg by mouth daily.   ascorbic acid 500 MG tablet Commonly known as: VITAMIN C Take 2,000 mg by mouth daily.   aspirin 81 MG chewable tablet Chew 1 tablet (81 mg total) by mouth daily.   buPROPion 150 MG 24 hr tablet Commonly known as: WELLBUTRIN XL Take 1 tablet by mouth daily.   busPIRone 10 MG tablet Commonly known as: BUSPAR Take 10 mg by mouth 2 (two) times  daily.   Cholecalciferol 10 MCG (400 UNIT) Caps Take 800 Units by mouth daily.   clopidogrel 75 MG tablet Commonly known as: PLAVIX Take 1 tablet (75 mg total) by mouth daily.   Enulose 10 GM/15ML Soln Generic drug: lactulose (encephalopathy) Take by mouth.   FISH OIL PO Take 1 capsule by mouth every evening.   furosemide 40 MG tablet Commonly known as: Lasix Take 1 tablet (40 mg total) by mouth daily as needed for edema (for fluid overload).   insulin lispro protamine-lispro (75-25) 100 UNIT/ML Susp injection Commonly known as: HUMALOG 75/25 MIX Inject 120 Units into the skin 3 (three) times daily with meals. What changed: how much to take   Jardiance 10 MG Tabs tablet Generic drug: empagliflozin Take 1 tablet (10 mg total) by mouth daily.   lactulose 10 GM/15ML solution Commonly known as: CHRONULAC Take 30 mLs by mouth daily as needed for constipation.   magnesium 30 MG tablet Take 30 mg by mouth at bedtime.   multivitamin tablet Take 1 tablet by mouth daily.   mycophenolate 360 MG Tbec EC tablet Commonly known as: MYFORTIC Take 1,080 mg by mouth 2 (two) times daily.   nadolol 20 MG tablet Commonly known as: CORGARD Take 1 tablet (20 mg total) by mouth 2 (two) times daily. What changed:  medication strength how much to take   ondansetron 4 MG disintegrating tablet Commonly known as: ZOFRAN-ODT Take 1 tablet (4 mg total) by mouth every 8 (eight) hours as needed for nausea or vomiting.   OneTouch Ultra test strip Generic drug: glucose blood USE TO TEST SUGARS 5 TIMES DAILY   Oxycodone HCl 10 MG Tabs Take 10 mg by mouth every 4 (four) hours as needed (pain).   potassium chloride SA 20 MEQ tablet Commonly known as: KLOR-CON M Take 1 tablet (20 mEq total) by mouth daily as needed (Please take 1 dose when you take your lasix). What changed:  how much to take when to take this reasons to take this   predniSONE 5 MG tablet Commonly known as:  DELTASONE Take 10 mg by mouth daily with breakfast.   spironolactone 25 MG tablet Commonly known as: ALDACTONE Take 0.5 tablets (12.5 mg total) by mouth daily.   sulfamethoxazole-trimethoprim 400-80 MG tablet Commonly known as: BACTRIM Take 1 tablet by mouth daily.   TURMERIC PO Take 400 mg by mouth daily.   zolpidem 10 MG tablet Commonly known as: AMBIEN Take 10 mg by mouth at bedtime.               Durable Medical Equipment  (From admission, onward)           Start     Ordered   08/29/23 0830  For home use only DME continuous positive airway pressure (CPAP)  Once       Question Answer  Comment  Length of Need 6 Months   Patient has OSA or probable OSA Yes   Settings 5-10   CPAP supplies needed Mask, headgear, cushions, filters, heated tubing and water chamber      08/29/23 0830            Discharge Instructions: Please refer to Patient Instructions section of EMR for full details.  Patient was counseled important signs and symptoms that should prompt return to medical care, changes in medications, dietary instructions, activity restrictions, and follow up appointments.   Follow-Up Appointments:  Follow-up Information     [facility] Follow up.   Specialty: Home Health Services Why: Someone will call you to schedule first home visit. If you have not received a call after two days of discharging home, call their number listed. If no one comes to assess, call Case Manager at [phone]. Contact information: [address] [place] [address] [phone]         Annita Brod, MD. Schedule an appointment as soon as possible for a visit in 1 week(s).   Specialty: Internal Medicine Contact information: [address] [place] [address] [phone]                 Para March, DO 08/29/2023, 2:18 PM PGY-1, [place] [facility]   Upper Level Addendum:  I have  seen and evaluated this patient along with Dr. Rexene Alberts and reviewed the above note, making necessary revisions as appropriate.  I agree with the medical decision making and physical exam as noted above.  Alfredo Martinez, MD PGY-3 [facility] Residency

## 2023-09-05 NOTE — Progress Notes (Deleted)
Cardiology Office Note:  .   Date:  09/05/2023  ID:  Monica Ellis, DOB Jul 09, 1961, MRN [id number] PCP: Annita Brod, MD  [facility] Providers Cardiologist:  Reatha Harps, MD   History of Present Illness: .   Monica Ellis is a 62 y.o. female with a hx of lupus, autoimmune hepatitis, HTN, diabetes, chronic respiratory failure. Patient is followed by Dr. Flora Lipps and presents today for a hospital follow up appointment.   Per chart review, patient had COVID in 2020, and after she recovered she continued to have worsening fatigue and dyspnea.  She was admitted in 06/2020 with shortness of breath and hypoxia.  CT chest showed atelectasis without acute findings.  Echocardiogram showed normal EF, moderate LVH, normal diastolic parameters, normal RV, mildly dilated LA.  She was diuresed with IV Lasix and her home narcotic dose was reduced.  It was suspected that her respiratory was multifactorial and it was recommended she have a patient's sleep study and pulmonology evaluation.  She was evaluated by [facility] pulmonology as an outpatient. In 08/2020, patient was again admitted with worsening shortness of breath.  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  Suspected mostly who group 2 and group 3 pulmonary hypertension.  Patient was treated with diuresis and BiPAP.  Also recommended to lose weight.  She underwent echocardiogram on 09/02/2020 that showed EF 55-60%, no regional wall motion abnormalities, grade 2 diastolic dysfunction, low-normal RV function.  She was ultimately discharged on torsemide.  She has not been seen by cardiology since 2022.   In 08/2022, patient was admitted with a CVA.  She had an echocardiogram on 09/04/2022 that showed no apical thrombus, EF 65-70%, grade 1 diastolic dysfunction, normal RV function, negative agitated saline contrast bubble study.   Patient presented to the ED on 08/23/2023  complaining of shortness of breath.  In the ED, BNP was 67.7.  High-sensitivity troponin negative x 2.  Chest x-ray showed no radiographic evidence of acute cardiopulmonary disease.  CT chest showed increased basilar scarring and atelectatic changes compared to previous studies, no consolidation or effusion.  Also noted enlargement of the main pulmonary artery recommended correlation for pulmonary artery hypertension.  Patient was admitted to the family medicine teaching service.   Pulmonology was consulted on 9/19.  Per their note, patient had been seen in the pulmonology clinic at [facility]  PFTs showed restriction to ventilation with reduced diffusion capacity.  There was some concern for possible portal pulmonary hypertension, however she had an echocardiogram that seem to rule out hepatopulmonary syndrome.  Pulmonologist at [facility] suspected that symptoms were most likely secondary to obesity hypoventilation syndrome.  She was started on a trilogy vent but she did not tolerate treatment for very long.  It was also recommended that patient undergo pulmonary rehab and weight loss, but patient did not participate in rehab.     Echocardiogram on 9/19 showed EF 60-65%, no regional wall motion abnormalities, grade II DD, normal RV systolic function and size. No TR present.  Cardiology was consulted.  Overall, suspected that her shortness of breath was not purely cardiac.  Started Jardiance, spironolactone for diastolic dysfunction.  Recommended outpatient sleep study for sleep apnea.  Chronic Hypoxemic Respiratory failure  Suspected OSA, OHS  COPD  Chronic diastolic heart failure  -Patient previously underwent right heart catheterization in 2021 that showed moderate-severe mixed pulmonary hypertension, suspected mostly group 2 and group 3 pulmonary hypertension -She was recently admitted from 9/17 -  9/23 with acute on chronic respiratory failure. -Echocardiogram 9/19 showed EF 60-65%, no regional wall motion  abnormalities, grade 2 DD, normal RV size and function.  No TR present. RA was 8, not suggestive of significant volume overload  -  -Ordered sleep study for OSA. Of note, patient did have improvement in symptoms when using CPAP in the hospital, but she did not leave with CPAP  - Continue jardiance 10 mg daily, spironolactone 12.5 mg daily  - Has lasix 40 mg to take daily as needed for edema, weight gain. Also has K supplementation to take when taking lasix  - Ordered BMP   History of CVA  - Previously had CVA in 08/2022 - Continua ASA 81 mg daily  - LDL 61   Morbid Obesity       ROS: ***  Studies Reviewed: .        *** Risk Assessment/Calculations:   {Does this patient have ATRIAL FIBRILLATION?:[id number]} No BP recorded.  {Refresh Note OR Click here to enter BP  :1}***       Physical Exam:   VS:  There were no vitals taken for this visit.   Wt Readings from Last 3 Encounters:  08/29/23 (!) 333 lb 8.9 oz (151.3 kg)  08/15/23 (!) 313 lb 0.9 oz (142 kg)  09/03/22 (!) 315 lb (142.9 kg)    GEN: Well nourished, well developed in no acute distress NECK: No JVD; No carotid bruits CARDIAC: ***RRR, no murmurs, rubs, gallops RESPIRATORY:  Clear to auscultation without rales, wheezing or rhonchi  ABDOMEN: Soft, non-tender, non-distended EXTREMITIES:  No edema; No deformity   ASSESSMENT AND PLAN: .   ***    {Are you ordering a CV Procedure (e.g. stress test, cath, DCCV, TEE, etc)?   Press F2        :[id number]}  Dispo: ***  Signed, Jonita Albee, PA-C

## 2023-09-19 ENCOUNTER — Ambulatory Visit: Payer: 59 | Attending: Cardiology | Admitting: Cardiology

## 2023-09-19 ENCOUNTER — Telehealth: Payer: Self-pay

## 2023-09-19 NOTE — Telephone Encounter (Signed)
Called Patient to reschedule 09/19/23 appointment they can either see KJ again or the can schedule with Dr Hughie Closs.

## 2023-10-04 ENCOUNTER — Telehealth: Payer: Self-pay | Admitting: Cardiovascular Disease

## 2023-10-04 ENCOUNTER — Encounter: Payer: Self-pay | Admitting: Cardiovascular Disease

## 2023-10-04 NOTE — Telephone Encounter (Signed)
-----   Message from [facility] sent at 09/19/2023 12:48 PM EDT ----- Regarding: Reschedule Please reschedule patient, they have missed 10/14 appointment.  Already called her and left message on her answering machine for her to call back.

## 2023-10-04 NOTE — Telephone Encounter (Signed)
Patient contacted 3x with no success in rescheduling. Will send letter.

## 2024-02-14 NOTE — Progress Notes (Signed)
 Liver Clinic - Return Evaluation  Primary Care Provider Karlene Mungo, MD [address] [place] [address]   Chief complaint: Chief Complaint  Patient presents with  . Autoimmune Hepatitis   History of Present Illness:  Monica Ellis is a 63 y.o. female who presents for ongoing management of autoimmune hepatitis with cirrhosis (biopsy-proven).   Patient was initially diagnosed with autoimmune hepatitis in 2012 when she was living in [place] . She also carries a diagnosis of lupus and diabetes. She used to be seen in [place] by Dr. Phillippe Zamor and [facility].  She was seen in hepatology clinic here at [facility] back in 2015 by Dr. Lupita Levins. She stopped coming to those appointments due to difficulty with transportation, though transferred care back into our system. She is known to adjust her IS based on symptoms and has been intermittently compliant with laboratory follow-up. She has been intolerant of azathioprine and 6-mercaptopurine in the past, and has had to rely heavily upon prednisone  for therapy. We have not had her in our clinic since May of 2023.    CT scan imaging in 2018 revealed hepatomegaly, steatosis and contour nodularity.  Her spleen size was upper limits of normal, no ascites and no report of esophageal varices.  In fall of 2019 she was seen by her gastroenterologist and found to have an AST of 216, ALT 355, and an elevated IgG of 4963.  She has a positive anti-smooth muscle body of 104, and an ANA 1:320.  She underwent a transjugular liver biopsy with hepatic pressure measurements, biopsy was consistent with mild to moderately active autoimmune hepatitis and cirrhosis.  Her hepatic pressure gradient was within normal limits at 6 mmHg.  She was restarted on CellCept  500 mg twice daily in December 2020 as she had previously reported intolerance of azathioprine and 6-MP with nausea, vomiting, and diarrhea. In March 2020, she was re-trialed on prednisone   after having been found to have significantly elevated AST and ALT of 239 and 352, respictively.  Ultimately she has responded well to Myfortic , and remains on Myfortic .  She also has interstitial lung disease and is on prednisone .  Interval events  Monica Ellis is here unaccompanied today. She presents in a wheelchair. She used transportation to attend appointment today.  History of Present Illness She has autoimmune hepatitis and is currently on Myfortic  1080 mg twice a day. She does not miss doses of this. She is prescribed prednisone  5 mg daily, but reports significant joint pain when reducing prednisone . She has been taking 10 mg prednisone  daily and spacing out pills to make this happen.   She is managing her blood sugar levels well, with an A1c of 6.3%, and has lost 10-15 pounds since her last visit. She is diligent about her diet, rarely consuming carbohydrates, and is focused on improving her health to return to activities like water aerobics.She has been working towards coming off oxygen.  She does report nausea and acid reflux overnight.  She has no evidence of decompensated liver disease. Denies jaundice, icterus, ascites formation, LE edema, confusion or GI bleeding.  Health Maintenance CRC screening: Colonoscopy 2018, polyp found. Repeat 2022, deferring given her overall poor health status after our discussion [facility] surveillance: 06/08/2023 US  negative for HCC. Repeat due 6 months. Recommend CT or MRI due to limitations of US  in setting of habitus. Will fax to have locally per patient request Hepatitis A completed vaccination, serology reactive Hepatitis B completed vaccination, serology nonreactive Influenza: Allergy to serum  Pneumonia: Completed prevnar and pneumovax Influenza: recommended to her this coming Fall 2024.    General Review of Systems:  General ROS: negative for - chills, fever, night sweats.  + fatigue Psychological ROS: negative for - anxiety and  depression Allergy and Immunology ROS: negative for - severe allergic reactions Hematological and Lymphatic ROS: negative for - bleeding problems and swollen lymph nodes Endocrine ROS: negative for - skin changes. + cold temperature intolerance Respiratory ROS: + shortness of breath Cardiovascular ROS: negative for - chest pain and palpitations Musculoskeletal ROS: negative for - joint pain and joint swelling Neurological ROS: negative for - gait disturbance and headaches Dermatological ROS: negative for rash  Past Medical History: Past Medical History:  Diagnosis Date  . Autoimmune hepatitis (CMS/HHS-HCC)   . Diabetes mellitus type 2, uncomplicated (CMS/HHS-HCC)   . Do not resuscitate 09/01/2021  . Hypertension   . Other cirrhosis of liver (CMS/HHS-HCC) 09/01/2021  . Systemic lupus erythematosus (CMS/HHS-HCC)     Past Surgical History: Past Surgical History:  Procedure Laterality Date  . COLONOSCOPY    . EGD    . LENS EYE SURGERY Left   . LIVER BIOPSY      Allergies: Azathioprine, Ace inhibitors, Iodinated contrast media, Iodine, Lisinopril, and Tramadol  Medications: Current Outpatient Medications  Medication Sig Dispense Refill  . ascorbic acid, vitamin C, (VITAMIN C) 500 MG tablet Take by mouth once daily    . buPROPion  (WELLBUTRIN  XL) 150 MG XL tablet Take 75 mg by mouth once daily    . cholecalciferol (VITAMIN D3) 400 unit tablet Take 800 Units by mouth daily.    . INSULIN  LISPRO (HUMALOG  KWIKPEN SUBQ) Inject 120 Units subcutaneously 3 (three) times daily    . lactulose  (ENULOSE ) 10 gram/15 mL oral solution Take 15 mLs by mouth once daily 450 mL 11  . magnesium  30 mg tablet Take by mouth once daily    . multivitamin tablet Take 1 tablet by mouth daily.    . mycophenolate  (MYFORTIC ) 360 MG EC tablet Take 3 tablets (1,080 mg total) by mouth every 12 (twelve) hours 540 tablet 3  . nadoloL  (CORGARD ) 40 MG tablet Take 40 mg by mouth at bedtime    . ondansetron  (ZOFRAN -ODT)  4 MG disintegrating tablet Take 4 mg by mouth every 8 (eight) hours as needed    . oxyCODONE  (OXYCONTIN ) 10 MG CR tablet Take 10 mg by mouth every 6 (six) hours as needed for Pain       . predniSONE  (DELTASONE ) 5 MG tablet Take 1 tablet (5 mg total) by mouth once daily (Patient taking differently: Take 10 mg by mouth once daily) 30 tablet 3  . promethazine  (PHENERGAN ) 25 MG tablet   0  . sulfamethoxazole -trimethoprim  (BACTRIM  SS) 400-80 mg tablet Take 1 tablet by mouth once daily    . zolpidem  (AMBIEN ) 10 mg tablet Take 10 mg by mouth nightly    . famotidine  (PEPCID ) 40 MG tablet Take 1 tablet (40 mg total) by mouth at bedtime 30 tablet 11  . hydroCHLOROthiazide  (HYDRODIURIL ) 12.5 MG tablet Take 25 mg by mouth once daily (Patient not taking: Reported on 02/14/2024)    . ONETOUCH ULTRA BLUE TEST STRIP test strip     . potassium chloride  (KLOR-CON ) 20 MEQ ER tablet Take 40 mEq by mouth once daily (Patient not taking: Reported on 02/14/2024)    . predniSONE  (DELTASONE ) 50 MG tablet Take 1 tablet (50 mg total) by mouth once daily for 3 doses Take one 50 mg tablet  13, 7 and 1 hours before scheduled radiology test with contrast 3 tablet 0   No current facility-administered medications for this visit.    Social History: Social History   Socioeconomic History  . Marital status: Single  Tobacco Use  . Smoking status: Every Day    Types: Cigarettes  . Smokeless tobacco: Never  . Tobacco comments:    pack a day  Substance and Sexual Activity  . Alcohol use: Never  . Drug use: Yes    Types: Oxycodone     Comment: Prescribed- oxycodone -PRN   Social Drivers of Health   Food Insecurity: No Food Insecurity (08/23/2023)   Received from [facility]   Hunger Vital Sign   . Worried About Programme researcher, broadcasting/film/video in the Last Year: Never true   . Ran Out of Food in the Last Year: Never true  Transportation Needs: No Transportation Needs (08/23/2023)   Received from [facility] - Transportation    . Lack of Transportation (Medical): No   . Lack of Transportation (Non-Medical): No  Housing Stability: Unknown (02/14/2024)   Housing Stability Vital Sign   . Homeless in the Last Year: No    Family History: Family History  Problem Relation Age of Onset  . Thyroid disease Mother   . Diabetes type II Father   . Systemic hypertension Father   . Glaucoma Maternal Grandmother   . Cirrhosis Neg Hx   . HCC Neg Hx   . Liver disease Neg Hx   . Macular degeneration Neg Hx     Physical Examination: Vitals:   02/14/24 1236  BP: (!) 159/91  Pulse: 65  Temp: 36.9 C (98.5 F)     Body mass index is 52.56 kg/m. Weight: (!) 147.7 kg (325 lb 9.9 oz)   General: NAD, appears stated age, dress and hygiene appropriate Skin: No scars, rashes, bruises appreciated, +acanthosis HEENT: Normocephalic, PERRL, EOMI, no scleral icterus, no injection, no rhinorrhea, moon facies, large neck, acanthis Mouth: No erythema in the orophaynx. Oropharynx is clear without sores or lesions. No tonsillar enlargement. Dentition is good Neck: No LAD, no JVD CV: Regular rhythm, no m/r/g appreicated Lungs: Clear to auscultation Abdomen: BMI 52, NABS, No scars on surface, NTTP, No HSM, normotympanic, without shifting dullness. Musculoskeletal: Edema bilateral LE, left greater than right. Axial muscle atrophy, weakness. Wheelchair bound Neuro: CN II-XII intact. Normal sensation, strength, refelxes, difficulty walking  Labs and Studies: Recent Labs    02/24/21 1041 09/01/21 1325 04/20/22 1244  NA 137 132* 137  K 3.1* 3.4* 3.9  CL 97* 94* 97*  CO2 28 31* 32*  BUN 13 22* 13  CREATININE 0.9 0.9 0.9  GLUCOSE 96 133 103  CALCIUM 9.3 9.1 9.3  AST 109* 31 95*  ALT 166* 43 151*  TBILI 0.8 0.9 0.7  ALKPHOS 44 49 40  ALB 3.4* 3.3* 3.5  TOTALPROTEIN 9.3* 9.0* 10.0*   Recent Labs    02/24/21 1041 09/01/21 1325 04/20/22 1244  WBC 8.3 10.0* 8.6  HGB 12.0 11.9* 14.5  HCT 39.0 38.0 45.4*  PLT 193 200 175    Recent Labs    02/24/21 1041 09/01/21 1325 04/20/22 1244  INR 1.1 1.1 1.1   Recent Labs    02/24/21 1041 09/01/21 1325 04/20/22 1244  IGG 3,540* 3,200* 4,600*  IGM 29* 38* 35*  IGA 307* 283 304*  IGE 28 232 493*   TJ liver biopsy 10/2018 Wedge pressure: 24 mmHg  Free right hepatic vein pressure:  19/17 (18) mmHg  Hepatic venous pressure gradient: 6 mmHg (normal <10)  The catheter was exchanged over the guidewire for the delivery sheath of the transjugular core biopsy needle. Three 18-gauge core liver biopsy samples were obtained, requiring 4 passes. The sheath was removed and hemostasis achieved at the site. Patient tolerated procedure well, with no immediate complication.  IMPRESSION: 1. Right hepatic venogram unremarkable. 2. Hepatic venous pressure gradient normal. 3. Technically successful transjugular core liver biopsy  A. LIVER, SITE NOT OTHERWISE SPECIFIED, NEEDLE CORE BIOPSY, outside case, [id number], [facility], [place], [place], date of procedure 11/03/2018: LIVER WITH MODERATE INTERFACE AND MILD LOBULAR HEPATITIS WITH ABUNDANT PLASMA CELLS. SEE COMMENT. CIRRHOSIS (STAGE 4 OF 4).   COMMENT: The liver core biopsy shows moderate interface and mild lobular hepatitis. There are many associated plasma cells. The overall findings are compatible with the clinical history of autoimmune hepatitis.  MELD 3.0: 8 at 06/22/2023  9:31 AM MELD-Na: 7 at 06/22/2023  9:31 AM Calculated from: Serum Creatinine: 0.9 mg/dL (Using min of 1 mg/dL) at 2/82/7975  0:68 AM Serum Sodium: 139 mmol/L (Using max of 137 mmol/L) at 06/22/2023  9:31 AM Total Bilirubin: 0.777 mg/dL (Using min of 1 mg/dL) at 2/82/7975  0:68 AM Serum Albumin : 3.9 g/dL (Using max of 3.5 g/dL) at 2/82/7975  0:68 AM INR(ratio): 1.1 at 06/22/2023  9:31 AM Age at listing (hypothetical): 62 years Sex: Female at 06/22/2023  9:31 AM   Assessment & Plan: [name] is a 63 y.o. female who presents  today for ongoing management of autoimmune hepatitis with biopsy evidence of cirrhosis.  1. Autoimmune hepatitis (CMS/HHS-HCC)   2. Other cirrhosis of liver (CMS/HHS-HCC)     Autoimmune hepatitis: She has had multiple challenges related to her treatment of autoimmune hepatitis.  She has failed treatment in the past with azathioprine and 6-mercaptopurine due to nausea and vomiting. Previously she was tolerating CellCept  1000 mg twice daily and prednisone .  She would later call into the office, with complaints gastrointestinally related to the CellCept .  This was transitioned to Myfortic , and she is currently taking 1080 mg twice daily of Myfortic . She is prescribed 5 mg daily of prednisone  but reports she has been taking 10 mg daily for years. She will space out prednisone  to make prescription last longer. She reports significant arthralgias with reducing prednisone . Long discussion with patient today about the long term side effects of prednisone . I have recommended that she reduce prednisone  to 7.5 mg daily with labs in 2 weeks. Will recommend that she follow up with rheumatology regarding her arthralgias.  - Labs today not completed as ordered. Will ask her to have first available.  is down to 5 prednisone  daily.  Her liver tests remain within normal range -Continue present dose of Myfortic , 1080mg  twice daily -Reduce prednisone  to 7.5 mg daily prednisone . She is resistant to do this due to her arthralgias.  -Repeat labs every 3 months with [facility] - CBC, CMP  Cirrhosis: Does have trace lower extremity edema, greatly improved. Unable to assess for ascites given habitus  Hepatic encephalopathy: - Continue lactulose  as needed.  She does not have clear symptoms of hepatic encephalopathy  Variceal surveillance:  - Will require a fibroscan to determine need for EGD - Patient prefers Fibroscan locally, if unable to have this done, can arrange for next clinic visit  HCC screening: - US  08/09/2023  without liver lesions. Discussed with patient having a CT scan completed - will need a steroid prep. She prefers to  have this locally. Will fax to [facility]  Acid Reflux: nighttime symptoms - Start famotidine  40 mg nightly.  - Start omeprazole 20 mg daily - Reducing prednisone  as above   Orders Placed This Encounter  Procedures  . Complete Blood Count (CBC) with Differential  . Alpha Fetoprotein (A1FP,Tumor)  . Comprehensive Metabolic Panel (CMP)  . Prothrombin Time (INR)  . Fibroscan Procedure ONLY   New Prescriptions   FAMOTIDINE  (PEPCID ) 40 MG TABLET    Take 1 tablet (40 mg total) by mouth at bedtime   PREDNISONE  (DELTASONE ) 50 MG TABLET    Take 1 tablet (50 mg total) by mouth once daily for 3 doses Take one 50 mg tablet 13, 7 and 1 hours before scheduled radiology test with contrast   The risks and benefits of my recommendations, as well as other treatment options were discussed with the patient today. Questions were answered. Follow up: 6 months     *Portions of this note were created using automated voice transcription software. Please excuse any errors that were not detected during the review of this note.    Lauraine Ladora Lento, FNP-C Gastroenterology and Transplant Hepatology [phone]    Attestation Statement:   I personally performed the service, non-incident to. [facility])   SARAH HOLBROOKS DAWKINS, NP

## 2024-02-21 ENCOUNTER — Other Ambulatory Visit: Payer: Self-pay | Admitting: Gastroenterology

## 2024-02-21 DIAGNOSIS — K7469 Other cirrhosis of liver: Secondary | ICD-10-CM
